# Patient Record
Sex: Female | Born: 1960 | Race: White | Hispanic: No | Marital: Married | Smoking: Former smoker
Health system: Southern US, Community
[De-identification: ages and names within clinical notes are randomized; demographics above are authoritative.]

## PROBLEM LIST (undated history)

## (undated) DIAGNOSIS — G709 Myoneural disorder, unspecified: Secondary | ICD-10-CM

## (undated) DIAGNOSIS — T8859XA Other complications of anesthesia, initial encounter: Secondary | ICD-10-CM

## (undated) DIAGNOSIS — F419 Anxiety disorder, unspecified: Secondary | ICD-10-CM

## (undated) DIAGNOSIS — J302 Other seasonal allergic rhinitis: Secondary | ICD-10-CM

## (undated) DIAGNOSIS — I10 Essential (primary) hypertension: Secondary | ICD-10-CM

## (undated) DIAGNOSIS — L989 Disorder of the skin and subcutaneous tissue, unspecified: Secondary | ICD-10-CM

## (undated) DIAGNOSIS — Z973 Presence of spectacles and contact lenses: Secondary | ICD-10-CM

## (undated) DIAGNOSIS — E785 Hyperlipidemia, unspecified: Secondary | ICD-10-CM

## (undated) DIAGNOSIS — G43909 Migraine, unspecified, not intractable, without status migrainosus: Secondary | ICD-10-CM

## (undated) DIAGNOSIS — M1990 Unspecified osteoarthritis, unspecified site: Secondary | ICD-10-CM

## (undated) DIAGNOSIS — M545 Low back pain, unspecified: Secondary | ICD-10-CM

## (undated) DIAGNOSIS — T4145XA Adverse effect of unspecified anesthetic, initial encounter: Secondary | ICD-10-CM

## (undated) DIAGNOSIS — G8929 Other chronic pain: Secondary | ICD-10-CM

## (undated) DIAGNOSIS — G2581 Restless legs syndrome: Secondary | ICD-10-CM

## (undated) HISTORY — DX: Disorder of the skin and subcutaneous tissue, unspecified: L98.9

## (undated) HISTORY — DX: Hyperlipidemia, unspecified: E78.5

## (undated) HISTORY — PX: JOINT REPLACEMENT: SHX530

## (undated) HISTORY — PX: VAGINAL HYSTERECTOMY: SUR661

## (undated) HISTORY — PX: BACK SURGERY: SHX140

## (undated) HISTORY — PX: COLONOSCOPY: SHX174

---

## 2005-10-06 ENCOUNTER — Ambulatory Visit (HOSPITAL_COMMUNITY): Admission: RE | Admit: 2005-10-06 | Discharge: 2005-10-06 | Payer: Self-pay | Admitting: Neurosurgery

## 2006-12-02 ENCOUNTER — Ambulatory Visit (HOSPITAL_COMMUNITY): Admission: RE | Admit: 2006-12-02 | Discharge: 2006-12-02 | Payer: Self-pay | Admitting: Neurosurgery

## 2007-10-05 ENCOUNTER — Ambulatory Visit: Payer: Self-pay | Admitting: Gastroenterology

## 2007-10-05 DIAGNOSIS — R635 Abnormal weight gain: Secondary | ICD-10-CM

## 2007-10-05 DIAGNOSIS — K5900 Constipation, unspecified: Secondary | ICD-10-CM | POA: Insufficient documentation

## 2007-10-05 LAB — CONVERTED CEMR LAB
ALT: 52 units/L — ABNORMAL HIGH (ref 0–35)
BUN: 8 mg/dL (ref 6–23)
Basophils Absolute: 0 10*3/uL (ref 0.0–0.1)
Bilirubin Urine: NEGATIVE
Calcium: 9.4 mg/dL (ref 8.4–10.5)
Chloride: 103 meq/L (ref 96–112)
Eosinophils Absolute: 0.4 10*3/uL (ref 0.0–0.6)
GFR calc non Af Amer: 82 mL/min
Glucose, Bld: 95 mg/dL (ref 70–99)
HCT: 42.3 % (ref 36.0–46.0)
Hemoglobin, Urine: NEGATIVE
Hemoglobin: 14.2 g/dL (ref 12.0–15.0)
Ketones, ur: NEGATIVE mg/dL
Leukocytes, UA: NEGATIVE
MCHC: 33.6 g/dL (ref 30.0–36.0)
MCV: 89.1 fL (ref 78.0–100.0)
Monocytes Absolute: 0.5 10*3/uL (ref 0.2–0.7)
Neutro Abs: 5.9 10*3/uL (ref 1.4–7.7)
Neutrophils Relative %: 63.8 % (ref 43.0–77.0)
Nitrite: POSITIVE — AB
Potassium: 3.8 meq/L (ref 3.5–5.1)
T4, Total: 7.8 ug/dL (ref 5.0–12.5)
TSH: 1.74 microintl units/mL (ref 0.35–5.50)
Total Protein, Urine: NEGATIVE mg/dL
pH: 5 (ref 5.0–8.0)

## 2007-10-13 ENCOUNTER — Ambulatory Visit: Payer: Self-pay | Admitting: Gastroenterology

## 2007-10-13 LAB — CONVERTED CEMR LAB
Anti Nuclear Antibody(ANA): NEGATIVE
HCV Ab: NEGATIVE
Saturation Ratios: 20.7 % (ref 20.0–50.0)

## 2007-10-14 ENCOUNTER — Ambulatory Visit (HOSPITAL_COMMUNITY): Admission: RE | Admit: 2007-10-14 | Discharge: 2007-10-14 | Payer: Self-pay | Admitting: Gastroenterology

## 2007-10-20 ENCOUNTER — Encounter: Payer: Self-pay | Admitting: Gastroenterology

## 2007-10-20 ENCOUNTER — Ambulatory Visit: Payer: Self-pay | Admitting: Gastroenterology

## 2007-11-23 ENCOUNTER — Ambulatory Visit: Payer: Self-pay | Admitting: Gastroenterology

## 2008-04-03 ENCOUNTER — Ambulatory Visit (HOSPITAL_COMMUNITY): Admission: RE | Admit: 2008-04-03 | Discharge: 2008-04-03 | Payer: Self-pay | Admitting: Neurosurgery

## 2009-12-04 ENCOUNTER — Emergency Department (HOSPITAL_COMMUNITY): Admission: EM | Admit: 2009-12-04 | Discharge: 2009-12-04 | Payer: Self-pay | Admitting: Emergency Medicine

## 2010-04-12 ENCOUNTER — Ambulatory Visit: Payer: Self-pay | Admitting: Vascular Surgery

## 2010-04-12 ENCOUNTER — Emergency Department (HOSPITAL_COMMUNITY): Admission: EM | Admit: 2010-04-12 | Discharge: 2010-04-12 | Payer: Self-pay | Admitting: Emergency Medicine

## 2010-04-12 ENCOUNTER — Encounter (INDEPENDENT_AMBULATORY_CARE_PROVIDER_SITE_OTHER): Payer: Self-pay | Admitting: Emergency Medicine

## 2010-06-26 ENCOUNTER — Emergency Department (HOSPITAL_COMMUNITY): Admission: EM | Admit: 2010-06-26 | Discharge: 2010-06-27 | Payer: Self-pay | Admitting: Emergency Medicine

## 2011-01-13 NOTE — Letter (Signed)
October 05, 2007    Mrs. Kendra Nelson   RE:  AVANTI, JETTER  MRN:  [id number]  /  DOB:  September 14, 1960   Dear Mrs. Kendra Nelson:   It is my pleasure to have treated you recently as a new patient in my  office.  I appreciate your confidence and the opportunity to participate  in your care.   Since I do have a busy inpatient endoscopy schedule and office schedule,  my office hours vary weekly.  I am, however, available for emergency  calls every day through my office.  If I cannot promptly meet an urgent  office appointment, another one of our gastroenterologists will be able  to assist you.   My well-trained staff are prepared to help you at all times.  For  emergencies after office hours, a physician from our gastroenterology  section is always available through my 24-hour answering service.   While you are under my care, I encourage discussion of your questions  and concerns, and I will be happy to return your calls as soon as I am  available.   Once again, I welcome you as a new patient and I look forward to a happy  and healthy relationship.    Sincerely,      Barbette Hair. Arlyce Dice, MD,FACG  Electronically Signed   RDK/MedQ  DD: 10/05/2007  DT: 10/05/2007  Job #: [id number]

## 2011-01-13 NOTE — Assessment & Plan Note (Signed)
[facility]                         GASTROENTEROLOGY OFFICE NOTE   Kendra Nelson, Kendra Nelson                   MRN:          [id number]  DATE:10/05/2007                            DOB:          September 01, 1960    PROBLEM:  Constipation.   Kendra Nelson is a 50 year old white female, here for evaluation of  constipation.  Over the last eight months, she has been suffering from  increasingly severe constipation.  Now she may not have a bowel movement  for up to one month.  Until a year ago, she was moving her bowels  regularly.  She has an implantable Dilaudid pump for chronic back pain.  She has had occasional bleeding when she strains at the stool.  With  constipation, she has abdominal distention and some discomfort.  She  also complains of severe swelling of her feet and legs.  She has gained  40-50 pounds over the past year.  She denies cold intolerance, change in  hair or skin texture or voice changes.   PAST MEDICAL HISTORY:  Pertinent for spinal surgery in 2003.  She has  the implantable pump and what sounds like an implantable TENS unit.  She  is status post hysterectomy.   FAMILY HISTORY:  Not contributory.   MEDICATIONS:  Her only medication is Dilaudid.   ALLERGIES:  She has no allergies.   She smokes a quarter pack a day.  She does not drink.  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   REVIEW OF SYSTEMS:  Positive for depression.   PHYSICAL EXAMINATION:  Pulse 72, blood pressure 126/80, weight 184.  HEENT: EOMI.  PERRLA.  Sclerae are anicteric.  Conjunctivae are pink.  NECK:  Supple without thyromegaly, adenopathy or carotid bruits.  CHEST:  Clear to auscultation and percussion without adventitious  sounds.  CARDIAC:  Regular rhythm; normal S1 S2.  There are no murmurs, gallops  or rubs.  ABDOMEN:  She has palpable devices in her lower abdomen bilaterally,  corresponding to the pumps and TENS unit.  Bowel sounds are  normoactive.  Abdomen is soft, nontender and nondistended.  There are no abdominal  masses, tenderness, splenic enlargement or hepatomegaly.  EXTREMITIES:  She has 1 to 2+ pitting edema in her lower extremities  below the knees.  Full range of motion.  No cyanosis, clubbing or edema.  RECTAL:  Deferred.   IMPRESSION:  Chronic constipation.  This certainly could be due to her  Dilaudid pump.  Her overall body swelling and weight-gain could also be  explained by hypothyroidism.   RECOMMENDATION:  Check CBC, CMET, urinalysis and thyroid function tests.  If not diagnostic, then I will proceed with colonoscopy and place her on  a bowel regimen, provided that no lesions are seen.     Barbette Hair. Arlyce Dice, MD,FACG  Electronically Signed    RDK/MedQ  DD: 10/05/2007  DT: 10/05/2007  Job #: [id number]   cc:   Tracey Harries, M.D.

## 2011-01-13 NOTE — Assessment & Plan Note (Signed)
[facility]                         GASTROENTEROLOGY OFFICE NOTE   Kendra Nelson, Kendra Nelson                   MRN:          [id number]  DATE:11/23/2007                            DOB:          [birth year]    REFERRING PHYSICIAN:  Tracey Harries, M.D.   PROBLEM:  Constipation.   Kendra Nelson returned for scheduled GI followup.  Colonoscopy  demonstrated a 4 mm sigmoid polyp.  Biopsies showed hyperplastic changes  only.  On a regimen of Amitiza 24 mcg twice a day and lactulose 30 mL  twice a day, she continues to suffer from obstipation and constipation.  She will have a bowel movement every two weeks.  Bowel movements are  watery.  She complains of fatigue and swelling of her legs.   EXAM:  Pulse 80, blood pressure 108/64, weight 182.   IMPRESSION:  Chronic constipation and obstipation.  I believe this is  related to her chronic narcotic use.  Patient clearly states that she  had much less constipation when taking oral narcotics, compared with the  results from her Dilaudid pump.  The current bowel regimen has been  unsuccessful.   RECOMMENDATIONS:  I have recommended to Kendra Nelson that she try  taking Fleet Phospho-Soda 1.5 to 3 ounces every four to five days to see  whether this can successfully keep her bowels moving.  Alternatively,  she may need to switch from the Dilaudid pump to oral narcotics, since  the oral medicines did not have the severe constipating effect.  She is  going to try the [employer].  Failing that, she will probably  have to discontinue her Dilaudid pump.   I instructed Kendra Nelson to return in three to four weeks, should she  decide to continue with the Fleet Phospho-Soda regimen.     Barbette Hair. Arlyce Dice, MD,FACG  Electronically Signed    RDK/MedQ  DD: 11/23/2007  DT: 11/23/2007  Job #: [id number]

## 2011-01-13 NOTE — Op Note (Signed)
Kendra Nelson, Kendra Nelson            ACCOUNT NO.:  [id number]   MEDICAL RECORD NO.:  [id number]          PATIENT TYPE:  OIB   LOCATION:  3533                         FACILITY:  [facility]   PHYSICIAN:  Danae Orleans. Venetia Maxon, M.D.  DATE OF BIRTH:  [birth year]   DATE OF PROCEDURE:  04/03/2008  DATE OF DISCHARGE:  04/03/2008                               OPERATIVE REPORT   PREOPERATIVE DIAGNOSIS:  Depleted implantable pulse generators (Synergy  spinal cord stimulator battery).   POSTOPERATIVE DIAGNOSIS:  Depleted implantable pulse generators (Synergy  spinal cord stimulator battery)   PROCEDURE:  Replacement of right lower quadrant implantable pulse  generator Synergy battery.   SURGEON:  Danae Orleans. Venetia Maxon, MD   ANESTHESIA:  MAC with local anesthetic.   ESTIMATED BLOOD LOSS:  Minimal.   COMPLICATIONS:  None.   DISPOSITION:  Recovery.   INDICATIONS:  Aberdeen Hafen is a 50 year old woman with chronic  intractable back and bilateral lower extremity pain.  She has an  implantable pulse generator and spinal cord stimulator, which was placed  in another facility, I have revised the battery 1 time and it was  depleted again.  She comes back for better revision.   PROCEDURE:  Ms. Bracher was brought to the operating room.  She was  placed in the supine position on the operating table.  Her right lower  quadrant was prepped and draped using Betadine scrub and paint.  Her  area of planned incision was infiltrated with local lidocaine.  Previous  incision was reopened.  Battery was removed from the subcutaneous  pocket.  The lead connectors were disconnected and placed in the same  orientation on new battery.  Connections were tightened down with torque  screwdriver.  Battery was placed in the subcutaneous pocket.  The wound  was irrigated and closed with 2-0 and 3-0 Vicryl subcuticular stitches.  The wound was dressed with benzoin, Steri-Strips, and telpha, gauze and  tape.  The patient was  extubated in the operating room and taken to the  recovery room in stable satisfactory condition.  She tolerated the  operation well.  Counts were correct at the end of the case.  Laryngeal  mask anesthesia was applied for the surgery.  At the end of the case,  the LMA was removed.      Danae Orleans. Venetia Maxon, M.D.  Electronically Signed     JDS/MEDQ  D:  04/03/2008  T:  04/04/2008  Job:  [id number]

## 2011-01-16 NOTE — Op Note (Signed)
[name], [name]            ACCOUNT NO.:  [id number]   MEDICAL RECORD NO.:  [id number]          PATIENT TYPE:  AMB   LOCATION:  SDS                          FACILITY:  [facility]   PHYSICIAN:  Danae Orleans. Venetia Maxon, M.D.  DATE OF BIRTH:  [birth year]   DATE OF PROCEDURE:  12/02/2006  DATE OF DISCHARGE:                               OPERATIVE REPORT   PREOPERATIVE DIAGNOSIS:  Depleted implantable pulse generator for spinal  cord stimulator ([employer]tr[employer] Synergy battery).   POSTOPERATIVE DIAGNOSIS:  Depleted implantable pulse generator for  spinal cord stimulator Museum/gallery curator).   PROCEDURE:  Replacement of Synergy implantable pulse generator.   SURGEON:  Venetia Maxon.   ASSISTANT:  [place], RN.   ANESTHESIA:  General endotracheal anesthesia.   ESTIMATED BLOOD LOSS:  Minimal.   COMPLICATIONS:  None.   DISPOSITION:  To recovery.   INDICATION:  Kendra Nelson is a 50 year old woman with an implantable  spinal cord stimulator which was placed at another institution.  She now  has a depleted battery and wishes to have this revised.  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   PROCEDURE IN DETAIL:  Ms. Saar was brought to the operating room  after satisfactory induction of LMA anesthesia.  She was placed in a  supine position on the operating room table.  Her battery had been  placed in the right lower quadrant of her abdomen.  After prepping and  draping the skin in a standard sterile fashion, the area of planned  incision was infiltrated with local lidocaine and the previous incision  was reopened.  The battery was removed and extensions were tacked and  disconnected and reconnected to a new battery in the same configurations  previously and contacts were secured with torque screwdriver.  The  battery was then inserted into the subcutaneous pocket.  The incision  was closed with 2-0 and 3-0 Vicryl sutures and a sterile occlusive  dressing was placed.  The  patient tolerated the procedure and was taken  to recovery in stable and satisfactory condition.     Danae Orleans. Venetia Maxon, M.D.  Electronically Signed    JDS/MEDQ  D:  12/02/2006  T:  12/02/2006  Job:  [id number]

## 2011-01-16 NOTE — Op Note (Signed)
NAMEZALMA, CHANNING            ACCOUNT NO.:  [id number]   MEDICAL RECORD NO.:  [id number]          PATIENT TYPE:  INP   LOCATION:  NA                           FACILITY:  [facility]   PHYSICIAN:  Danae Orleans. Venetia Maxon, M.D.  DATE OF BIRTH:  09-12-1960   DATE OF PROCEDURE:  10/06/2005  DATE OF DISCHARGE:                                 OPERATIVE REPORT   PREOPERATIVE DIAGNOSIS:  Chronic intractable back and lower extremity pain.   POSTOPERATIVE DIAGNOSIS:  Chronic intractable back and lower extremity pain.   PROCEDURE:  Intrathecal Dilaudid catheter and subcutaneous pump placement.   SURGEON:  Danae Orleans. Venetia Maxon, M.D.   ANESTHESIA:  General endotracheal anesthesia.   ESTIMATED BLOOD LOSS:  Minimal.   COMPLICATIONS:  None.   DISPOSITION:  To recovery.   INDICATIONS:  Jonathan Kirkendoll is a 50 year old woman who has had multiple  prior spinal surgical procedures, including multilevel fusion.  She has  chronic intractable pain.  She has had spinal cord stimulators placed and  had a trial of intrathecal Dilaudid with relief of her pain.  It was elected  to place an intrathecal Dilaudid pump.   PROCEDURE:  Ms. Valentina Gu was brought to the operating room.  Following the  successful and uncomplicated induction of general endotracheal anesthesia,  the patient was placed in a right lateral decubitus position with her left  side of the body up on a bean bag and with an axillary roll.  Soft tissue  and bony prominences were padded appropriately.  Her back and abdomen were  then prepped and draped in the usual sterile fashion.  Using intraoperative  fluoroscopy with an entry point at the left side of the L3 pedicle, a Tuohy  needle was placed into the thecal sac and it was placed carefully to avoid  the previous hardware and also previously-placed electrodes.  There was  brisk return of CSF, and the intrathecal catheter was threaded without  difficulty.  Then an incision was made overlying the  Tuohy needle and this  was then carried to the lumbar dorsal fascia.  The needle and stylet were  withdrawn.  An anchor was placed with a 2-0 silk stitch, and there was free  flow of spinal fluid from the catheter.  Subsequently the abdominal pocket  was created and three 2-0 silk stitches were placed in the fascia to secure  the pump after the pump was placed.  A tunnel was then created from the back  to the abdominal pocket and the two-piece catheter assembly was then placed.  The redundant catheter was then cut and the pin was placed and secured with  the standard two-piece assembly method with 2-0 silk ties overlying the  Silastic cover.  This was then anchored to the fascia, leaving a gentle  curve of catheter to the spine.  Subsequently there was a free flow of  spinal fluid from the distal catheter.  This was then hooked to the pump and  the pump was then secured to the fascia, placed in the abdominal pocket.  Redundant catheter was carefully placed behind the pump and a silk tie  was  placed overlying the junction of the pump and the catheter.  All wounds were  then copiously irrigated with bacitracin and saline and  closed with 2-0 and 3-0 Vicryl interrupted, inverted sutures and the wounds  were dressed with Benzoin and Steri-Strips, Telfa gauze and tape.  The  patient was extubated in the operating room and taken to the recovery room  in stable and satisfactory condition, having tolerated her operation well.  Counts were correct x2.      Danae Orleans. Venetia Maxon, M.D.  Electronically Signed     JDS/MEDQ  D:  10/06/2005  T:  10/06/2005  Job:  [id number]

## 2011-05-29 LAB — DIFFERENTIAL
Lymphocytes Relative: 31
Lymphs Abs: 2.8
Monocytes Absolute: 0.5
Monocytes Relative: 6
Neutro Abs: 5.1
Neutrophils Relative %: 57

## 2011-05-29 LAB — CBC
Hemoglobin: 14.1
MCHC: 33.7
RBC: 4.49
WBC: 9

## 2011-11-06 DIAGNOSIS — I10 Essential (primary) hypertension: Secondary | ICD-10-CM | POA: Insufficient documentation

## 2012-04-25 ENCOUNTER — Other Ambulatory Visit: Payer: Self-pay | Admitting: Neurosurgery

## 2012-05-03 ENCOUNTER — Encounter (HOSPITAL_COMMUNITY): Payer: Self-pay | Admitting: Pharmacy Technician

## 2012-05-05 ENCOUNTER — Encounter (HOSPITAL_COMMUNITY): Payer: Self-pay

## 2012-05-05 ENCOUNTER — Encounter (HOSPITAL_COMMUNITY)
Admission: RE | Admit: 2012-05-05 | Discharge: 2012-05-05 | Disposition: A | Discharge status: Home / Self Care | Payer: Medicare Other | Source: Ambulatory Visit | Attending: Neurosurgery | Admitting: Neurosurgery

## 2012-05-05 HISTORY — DX: Adverse effect of unspecified anesthetic, initial encounter: T41.45XA

## 2012-05-05 HISTORY — DX: Other complications of anesthesia, initial encounter: T88.59XA

## 2012-05-05 HISTORY — DX: Myoneural disorder, unspecified: G70.9

## 2012-05-05 HISTORY — DX: Unspecified osteoarthritis, unspecified site: M19.90

## 2012-05-05 LAB — CBC
HCT: 42.2 % (ref 36.0–46.0)
Hemoglobin: 14.5 g/dL (ref 12.0–15.0)
MCH: 30.7 pg (ref 26.0–34.0)
MCHC: 34.4 g/dL (ref 30.0–36.0)
MCV: 89.4 fL (ref 78.0–100.0)
RBC: 4.72 MIL/uL (ref 3.87–5.11)

## 2012-05-05 LAB — SURGICAL PCR SCREEN: Staphylococcus aureus: NEGATIVE

## 2012-05-05 NOTE — Pre-Procedure Instructions (Signed)
20 Kendra Nelson  05/05/2012   Your procedure is scheduled on: Tuesday  05/10/12   Report to [facility] at 1015 AM.  Call this number if you have problems the morning of surgery: [phone]   Remember:   Do not eat food OR DRINK :After Midnight.    Take these medicines the morning of surgery with A SIP OF WATER:  VALIUM IF NEEDED, OXYCODONE IF NEEDED (STOP ASPIRIN, COUMADIN, PLAVIX, EFFIENT, HERBAL MEDICINES)   Do not wear jewelry, make-up or nail polish.  Do not wear lotions, powders, or perfumes. You may wear deodorant.  Do not shave 48 hours prior to surgery. Men may shave face and neck.  Do not bring valuables to the hospital.  Contacts, dentures or bridgework may not be worn into surgery.  Leave suitcase in the car. After surgery it may be brought to your room.  For patients admitted to the hospital, checkout time is 11:00 AM the day of discharge.   Patients discharged the day of surgery will not be allowed to drive home.  Name and phone number of your driver:   Special Instructions: CHG Shower Use Special Wash: 1/2 bottle night before surgery and 1/2 bottle morning of surgery.   Please read over the following fact sheets that you were given: Pain Booklet, Coughing and Deep Breathing, MRSA Information and Surgical Site Infection Prevention

## 2012-05-06 NOTE — Consult Note (Signed)
Anesthesia chart review: Patient is a 51 year old female scheduled for pump replacement. (She has a Medtronic spinal cord stimulator.)  This is scheduled for 05/10/12 by Dr. Venetia Maxon.  Other history includes smoking, headaches.  She reported that she woke up during prior back surgery.  (Her only procedure at [facility] was replacement of right lower quadrant implantable pulse generator Synergy battery on 04/03/08 under local and MAC.)  Her assigned Anesthesiologist will evaluate her on the day of surgery.  Shonna Chock, PA-C

## 2012-05-09 MED ORDER — CEFAZOLIN SODIUM-DEXTROSE 2-3 GM-% IV SOLR
2.0000 g | INTRAVENOUS | Status: AC
  Administered 2012-05-10: 2 g via INTRAVENOUS
  Filled 2012-05-09: qty 50

## 2012-05-09 NOTE — H&P (Signed)
NEUROSURGICAL CONSULTATION   Kendra Nelson   DOB: 23-Nov-1960 #[id number]    April 25, 2012   HISTORY:     Kendra Nelson is a 51 year old retired woman in whom I placed an intrathecal pump for pain management, which was placed on 10/06/2005. Additionally, she has had a spinal cord stimulator, which was placed at another institution and the pulse generator which I revised on 12/02/2006.  Dr. Maple Hudson has been managing her pain issues and she comes in today stating that she had a pump alarm on 7/29 and that the pump is not working well and that there appears to be a low battery alarm, and there is evidence of a rotor stall on the pump. She is interested in doing away with the pump; however, she continues to use it and there was some discussion about going off the pump.  I do not believe that stopping it altogether and going to oral medications at this point makes a great deal of sense.  She is currently receiving  in the pump Hydromorphone 7 mg. per cc., Clonidine 65 mcg./ml. and Fentanyl 250 mcg./ml., with a total daily dose of drug of Hydromorphone .0426 mg. per day, Clonidine .396 mcg. per day, and Fentanyl 1.31 mcg. per day.  The patient says that her implanted spinal cord stimulator has been working well, that she is currently taking Diazepam 10 mg. three per day, Hydromorphone 2 mg. two per day, and Oxycodone 10 mg. three per day when her pumped stopped working. She is also on Lisinopril 10 mg. daily for high blood pressure and a Fentanyl patch 100 mcg. per hour every three days after the pump was depleted and no longer functioning.  She expressed a desire for the pump to be left alone, for her to come off of the pump intrathecal medications, and then if she needs the pump despite an effort to come off the intrathecal medications that this could be revised at that point.  I expressed concern, as did Kendra Nelson from [employer], that leaving a nonfunctioning pump for a long period of time would have a significant risk of  blocking the catheter and at that point it would no longer be functional.    In light of this, the patient would like to proceed with pump revision and this would be performed under monitored anesthesia. She wishes to do so and we plan on doing so on 05/10/2012.    Since I have last seen her, Kendra Nelson has been stable.  She has high blood pressure.  She says that she is having a lot of trouble with constipation.    REVIEW OF SYSTEMS:   A detailed Review of Systems sheet was reviewed with the patient.  Pertinent positives include under cardiovascular - high blood pressure, swelling in feet, leg pain while walking and sitting, under gastrointestinal - change in bowel habits, under musculoskeletal - back pain, leg pain, and arthritis.  All other systems are negative; this includes   Constitutional symptoms, Eyes, Ears, nose, mouth, throat, Endocrine, Respiratory, Genitourinary, Integumentary & Breast, Neurologic, Psychiatric, Hematologic/Lymphatic, Allergic/Immunologic.    PAST MEDICAL HISTORY:      Current Medical Conditions:    As previously described above.     Prior Operations and Hospitalizations:   As previously described above.      Medications and Allergies:  As previously described above.      Height and Weight:     She is 5'3" tall and 146 lbs.    FAMILY HISTORY:  Her mother is deceased. Her father's health history is unknown.    SOCIAL HISTORY:    She continues to smoke half pack per day of cigarettes.  She denies alcohol or drug use.    PHYSICAL EXAMINATION:      General Appearance:   On examination today, Kendra Nelson is a pleasant and cooperative woman in no acute distress.      Blood Pressure, Pulse:     Her blood pressure is 140/100.  Heart rate is 74 and regular.  Respiratory rate is 18.      HEENT - normocephalic, atraumatic.  The pupils are equal, round and reactive to light.  The extraocular muscles are intact.  Sclerae - white.  Conjunctiva - pink.  Oropharynx  benign.  Uvula midline.     Neck - there are no masses, meningismus, deformities, tracheal deviation, jugular vein distention or carotid bruits.  There is normal cervical range of motion.  Spurlings' test is negative without reproducible radicular pain turning the patient's head to either side.  Lhermitte's sign is not present with axial compression.      Respiratory - there is normal respiratory effort with good intercostal function.  Lungs are clear to auscultation.  There are no rales, rhonchi or wheezes.      Cardiovascular - the heart has regular rate and rhythm to auscultation.  No murmurs are appreciated.  There is no extremity edema, cyanosis or clubbing.  There are palpable pedal pulses.      Abdomen - soft, nontender, no hepatosplenomegaly appreciated or masses.  There are active bowel sounds.  No guarding or rebound.      Musculoskeletal Examination - the patient is able to walk about the examining room with a normal, casual, heel and toe gait.  A complaint of bilateral lower extremity pain.    NEUROLOGICAL EXAMINATION: The patient is oriented to time, person and place and has good recall of both recent and remote memory with normal attention span and concentration.  The patient speaks with clear and fluent speech and exhibits normal language function and appropriate fund of knowledge.      Cranial Nerve Examination - pupils are equal, round and reactive to light.  Extraocular movements are full.  Visual fields are full to confrontational testing.  Facial sensation and facial movement are symmetric and intact.  Hearing is intact to finger rub.  Palate is upgoing.  Shoulder shrug is symmetric.  Tongue protrudes in the midline.      Motor Examination - motor strength is 5/5 in the bilateral deltoids, biceps, triceps, handgrips, wrist extensors, interosseous.  In the lower extremities motor strength is 5/5 in hip flexion, extension, quadriceps, hamstrings, plantar flexion, dorsiflexion and  extensor hallucis longus.      Sensory Examination - normal to light touch and pinprick sensation in the upper and lower extremities.     Deep Tendon Reflexes - 2 in the biceps, triceps, and brachioradialis, 2 in the knees, 2 in the ankles.  The great toes are downgoing to plantar stimulation.      Cerebellar Examination - normal coordination in upper and lower extremities and normal rapid alternating movements.  Romberg test is negative.    IMPRESSION AND RECOMMENDATIONS: Kendra Nelson is a 51 year old woman with implanted pulse generator for spinal cord stimulator, and intrathecal pump for chronic intractable back and lower extremity pain.  After a lengthy discussion with the patient about her options, she does wish to proceed with revision of intrathecal pump, and this will  be scheduled for 05/10/2012.  Risks and benefits were discussed and she wishes to proceed.     [facility]    Danae Orleans. Venetia Maxon, M.D.  JDS:aft cc: Dr. Charmayne Sheer

## 2012-05-10 ENCOUNTER — Observation Stay (HOSPITAL_COMMUNITY)
Admission: RE | Admit: 2012-05-10 | Discharge: 2012-05-10 | Disposition: A | Discharge status: Home / Self Care | Payer: Medicare Other | Source: Ambulatory Visit | Attending: Neurosurgery | Admitting: Neurosurgery

## 2012-05-10 ENCOUNTER — Ambulatory Visit (HOSPITAL_COMMUNITY): Payer: Medicare Other | Admitting: Vascular Surgery

## 2012-05-10 ENCOUNTER — Encounter (HOSPITAL_COMMUNITY): Payer: Self-pay | Admitting: Vascular Surgery

## 2012-05-10 ENCOUNTER — Encounter (HOSPITAL_COMMUNITY): Payer: Self-pay | Admitting: *Deleted

## 2012-05-10 ENCOUNTER — Encounter (HOSPITAL_COMMUNITY)
Admission: RE | Disposition: A | Discharge status: Home / Self Care | Payer: Self-pay | Source: Ambulatory Visit | Attending: Neurosurgery

## 2012-05-10 DIAGNOSIS — M545 Low back pain, unspecified: Secondary | ICD-10-CM | POA: Insufficient documentation

## 2012-05-10 DIAGNOSIS — G8929 Other chronic pain: Secondary | ICD-10-CM | POA: Insufficient documentation

## 2012-05-10 DIAGNOSIS — Z01812 Encounter for preprocedural laboratory examination: Secondary | ICD-10-CM | POA: Insufficient documentation

## 2012-05-10 DIAGNOSIS — Z462 Encounter for fitting and adjustment of other devices related to nervous system and special senses: Principal | ICD-10-CM | POA: Insufficient documentation

## 2012-05-10 HISTORY — PX: PAIN PUMP IMPLANTATION: SHX330

## 2012-05-10 SURGERY — PAIN PUMP INSERTION
Anesthesia: Monitor Anesthesia Care | Site: Abdomen | Wound class: Clean

## 2012-05-10 MED ORDER — DIAZEPAM 5 MG PO TABS: 10.0000 mg | ORAL_TABLET | Freq: Three times a day (TID) | ORAL | Status: DC

## 2012-05-10 MED ORDER — 0.9 % SODIUM CHLORIDE (POUR BTL) OPTIME
TOPICAL | Status: DC | PRN
  Administered 2012-05-10: 1000 mL

## 2012-05-10 MED ORDER — SODIUM CHLORIDE 0.9 % IJ SOLN: 3.0000 mL | Freq: Two times a day (BID) | INTRAMUSCULAR | Status: DC

## 2012-05-10 MED ORDER — DIAZEPAM 5 MG PO TABS: 5.0000 mg | ORAL_TABLET | Freq: Four times a day (QID) | ORAL | Status: DC | PRN

## 2012-05-10 MED ORDER — LACTATED RINGERS IV SOLN
INTRAVENOUS | Status: DC | PRN
  Administered 2012-05-10: 15:00:00 via INTRAVENOUS

## 2012-05-10 MED ORDER — ONDANSETRON HCL 4 MG/2ML IJ SOLN: 4.0000 mg | Freq: Four times a day (QID) | INTRAMUSCULAR | Status: DC | PRN

## 2012-05-10 MED ORDER — OXYCODONE HCL 5 MG PO TABS: 15.0000 mg | ORAL_TABLET | Freq: Three times a day (TID) | ORAL | Status: DC

## 2012-05-10 MED ORDER — ONDANSETRON HCL 4 MG/2ML IJ SOLN: 4.0000 mg | INTRAMUSCULAR | Status: DC | PRN

## 2012-05-10 MED ORDER — ONDANSETRON HCL 4 MG/2ML IJ SOLN
INTRAMUSCULAR | Status: DC | PRN
  Administered 2012-05-10: 4 mg via INTRAVENOUS

## 2012-05-10 MED ORDER — SODIUM CHLORIDE 0.9 % IV SOLN
INTRAVENOUS | Status: AC
  Filled 2012-05-10: qty 500

## 2012-05-10 MED ORDER — PROPOFOL INFUSION 10 MG/ML OPTIME
INTRAVENOUS | Status: DC | PRN
  Administered 2012-05-10: 50 ug/kg/min via INTRAVENOUS

## 2012-05-10 MED ORDER — BUPIVACAINE HCL (PF) 0.5 % IJ SOLN
INTRAMUSCULAR | Status: DC | PRN
  Administered 2012-05-10: 5 mL

## 2012-05-10 MED ORDER — SODIUM CHLORIDE 0.9 % IR SOLN
Status: DC | PRN
  Administered 2012-05-10: 16:00:00

## 2012-05-10 MED ORDER — VITAMIN C 500 MG PO TABS: 1000.0000 mg | ORAL_TABLET | Freq: Two times a day (BID) | ORAL | Status: DC

## 2012-05-10 MED ORDER — LIDOCAINE-EPINEPHRINE 1 %-1:100000 IJ SOLN
INTRAMUSCULAR | Status: DC | PRN
  Administered 2012-05-10: 5 mL

## 2012-05-10 MED ORDER — BACITRACIN 50000 UNITS IM SOLR
INTRAMUSCULAR | Status: AC
  Filled 2012-05-10: qty 1

## 2012-05-10 MED ORDER — MIDAZOLAM HCL 5 MG/5ML IJ SOLN
INTRAMUSCULAR | Status: DC | PRN
  Administered 2012-05-10 (×2): 2 mg via INTRAVENOUS

## 2012-05-10 MED ORDER — HYDROMORPHONE HCL PF 1 MG/ML IJ SOLN: 0.2500 mg | INTRAMUSCULAR | Status: DC | PRN

## 2012-05-10 MED ORDER — ACETAMINOPHEN 325 MG PO TABS: 650.0000 mg | ORAL_TABLET | ORAL | Status: DC | PRN

## 2012-05-10 MED ORDER — SODIUM CHLORIDE 0.9 % IV SOLN: 250.0000 mL | INTRAVENOUS | Status: DC

## 2012-05-10 MED ORDER — HYDROCODONE-ACETAMINOPHEN 5-325 MG PO TABS: 1.0000 | ORAL_TABLET | ORAL | Status: DC | PRN

## 2012-05-10 MED ORDER — DOCUSATE SODIUM 100 MG PO CAPS: 100.0000 mg | ORAL_CAPSULE | Freq: Two times a day (BID) | ORAL | Status: DC

## 2012-05-10 MED ORDER — SODIUM CHLORIDE 0.9 % IJ SOLN: 3.0000 mL | INTRAMUSCULAR | Status: DC | PRN

## 2012-05-10 MED ORDER — FENTANYL 100 MCG/HR TD PT72: 100.0000 ug | MEDICATED_PATCH | TRANSDERMAL | Status: DC

## 2012-05-10 MED ORDER — MORPHINE SULFATE 2 MG/ML IJ SOLN: 1.0000 mg | INTRAMUSCULAR | Status: DC | PRN

## 2012-05-10 MED ORDER — CEFAZOLIN SODIUM 1-5 GM-% IV SOLN
1.0000 g | Freq: Three times a day (TID) | INTRAVENOUS | Status: DC
  Filled 2012-05-10 (×2): qty 50

## 2012-05-10 MED ORDER — PHENOL 1.4 % MT LIQD: 1.0000 | OROMUCOSAL | Status: DC | PRN

## 2012-05-10 MED ORDER — FENTANYL CITRATE 0.05 MG/ML IJ SOLN
INTRAMUSCULAR | Status: DC | PRN
  Administered 2012-05-10 (×10): 25 ug via INTRAVENOUS

## 2012-05-10 MED ORDER — KCL IN DEXTROSE-NACL 20-5-0.45 MEQ/L-%-% IV SOLN
INTRAVENOUS | Status: DC
  Filled 2012-05-10 (×2): qty 1000

## 2012-05-10 MED ORDER — ACETAMINOPHEN 650 MG RE SUPP: 650.0000 mg | RECTAL | Status: DC | PRN

## 2012-05-10 MED ORDER — EPHEDRINE SULFATE 50 MG/ML IJ SOLN
INTRAMUSCULAR | Status: DC | PRN
  Administered 2012-05-10: 5 mg via INTRAVENOUS

## 2012-05-10 MED ORDER — VITAMIN C 500 MG PO TABS
1000.0000 mg | ORAL_TABLET | Freq: Two times a day (BID) | ORAL | Status: DC
  Filled 2012-05-10: qty 2

## 2012-05-10 MED ORDER — ADULT MULTIVITAMIN W/MINERALS CH
1.0000 | ORAL_TABLET | Freq: Every day | ORAL | Status: DC
  Filled 2012-05-10: qty 1

## 2012-05-10 MED ORDER — OXYCODONE-ACETAMINOPHEN 5-325 MG PO TABS
1.0000 | ORAL_TABLET | ORAL | Status: DC | PRN
  Filled 2012-05-10: qty 2

## 2012-05-10 MED ORDER — MENTHOL 3 MG MT LOZG: 1.0000 | LOZENGE | OROMUCOSAL | Status: DC | PRN

## 2012-05-10 SURGICAL SUPPLY — 78 items
APL SKNCLS STERI-STRIP NONHPOA (GAUZE/BANDAGES/DRESSINGS) ×1
BAG DECANTER FOR FLEXI CONT (MISCELLANEOUS) ×2 IMPLANT
BENZOIN TINCTURE PRP APPL 2/3 (GAUZE/BANDAGES/DRESSINGS) ×1 IMPLANT
BLADE SURG 10 STRL SS (BLADE) ×4 IMPLANT
BLADE SURG 15 STRL LF DISP TIS (BLADE) ×1 IMPLANT
BLADE SURG 15 STRL SS (BLADE) ×2
BLADE SURG ROTATE 9660 (MISCELLANEOUS) ×1 IMPLANT
BOOT SUTURE AID YELLOW STND (SUTURE) ×2 IMPLANT
BRUSH SCRUB EZ 1% IODOPHOR (MISCELLANEOUS) ×1 IMPLANT
CANISTER SUCTION 2500CC (MISCELLANEOUS) ×2 IMPLANT
CLOTH BEACON ORANGE TIMEOUT ST (SAFETY) ×2 IMPLANT
CONT SPEC 4OZ CLIKSEAL STRL BL (MISCELLANEOUS) IMPLANT
CORDS BIPOLAR (ELECTRODE) ×2 IMPLANT
COVER MAYO STAND STRL (DRAPES) ×2 IMPLANT
DRAPE C-ARM 42X72 X-RAY (DRAPES) ×2 IMPLANT
DRAPE INCISE IOBAN 85X60 (DRAPES) ×1 IMPLANT
DRAPE LAPAROTOMY 100X72X124 (DRAPES) ×2 IMPLANT
DRAPE POUCH INSTRU U-SHP 10X18 (DRAPES) ×2 IMPLANT
DRESSING TELFA 8X3 (GAUZE/BANDAGES/DRESSINGS) ×2 IMPLANT
DRSG PAD ABDOMINAL 8X10 ST (GAUZE/BANDAGES/DRESSINGS) IMPLANT
ELECT CAUTERY BLADE 6.4 (BLADE) ×1 IMPLANT
ELECT REM PT RETURN 9FT ADLT (ELECTROSURGICAL) ×2
ELECTRODE REM PT RTRN 9FT ADLT (ELECTROSURGICAL) ×1 IMPLANT
GAUZE SPONGE 4X4 16PLY XRAY LF (GAUZE/BANDAGES/DRESSINGS) ×2 IMPLANT
GLOVE BIO SURGEON STRL SZ 6.5 (GLOVE) ×1 IMPLANT
GLOVE BIO SURGEON STRL SZ8 (GLOVE) ×2 IMPLANT
GLOVE BIOGEL PI IND STRL 7.0 (GLOVE) IMPLANT
GLOVE BIOGEL PI IND STRL 8 (GLOVE) ×1 IMPLANT
GLOVE BIOGEL PI IND STRL 8.5 (GLOVE) ×1 IMPLANT
GLOVE BIOGEL PI INDICATOR 7.0 (GLOVE) ×1
GLOVE BIOGEL PI INDICATOR 8 (GLOVE) ×1
GLOVE BIOGEL PI INDICATOR 8.5 (GLOVE) ×1
GLOVE ECLIPSE 7.5 STRL STRAW (GLOVE) ×1 IMPLANT
GLOVE ECLIPSE 8.0 STRL XLNG CF (GLOVE) ×1 IMPLANT
GLOVE EXAM NITRILE LRG STRL (GLOVE) IMPLANT
GLOVE EXAM NITRILE MD LF STRL (GLOVE) IMPLANT
GLOVE EXAM NITRILE XL STR (GLOVE) IMPLANT
GLOVE EXAM NITRILE XS STR PU (GLOVE) IMPLANT
GLOVE INDICATOR 6.5 STRL GRN (GLOVE) ×1 IMPLANT
GOWN BRE IMP SLV AUR LG STRL (GOWN DISPOSABLE) ×2 IMPLANT
GOWN BRE IMP SLV AUR XL STRL (GOWN DISPOSABLE) IMPLANT
GOWN STRL REIN 2XL LVL4 (GOWN DISPOSABLE) IMPLANT
KIT BASIN OR (CUSTOM PROCEDURE TRAY) ×2 IMPLANT
KIT REFILL (MISCELLANEOUS) ×1
KIT REFILL CATH SYNCHROMED II (MISCELLANEOUS) IMPLANT
KIT ROOM TURNOVER OR (KITS) ×2 IMPLANT
NDL HYPO 18GX1.5 BLUNT FILL (NEEDLE) IMPLANT
NDL HYPO 25X1 1.5 SAFETY (NEEDLE) ×1 IMPLANT
NEEDLE HYPO 18GX1.5 BLUNT FILL (NEEDLE) IMPLANT
NEEDLE HYPO 25X1 1.5 SAFETY (NEEDLE) ×2 IMPLANT
NS IRRIG 1000ML POUR BTL (IV SOLUTION) ×2 IMPLANT
PACK EENT II TURBAN DRAPE (CUSTOM PROCEDURE TRAY) ×1 IMPLANT
PACK SURGICAL SETUP 50X90 (CUSTOM PROCEDURE TRAY) ×1 IMPLANT
PATTIES SURGICAL .5 X.5 (GAUZE/BANDAGES/DRESSINGS) IMPLANT
PATTIES SURGICAL 1X1 (DISPOSABLE) IMPLANT
PENCIL BUTTON HOLSTER BLD 10FT (ELECTRODE) ×2 IMPLANT
SPONGE GAUZE 4X4 12PLY (GAUZE/BANDAGES/DRESSINGS) ×1 IMPLANT
SPONGE LAP 4X18 X RAY DECT (DISPOSABLE) ×2 IMPLANT
SPONGE SURGIFOAM ABS GEL SZ50 (HEMOSTASIS) IMPLANT
STAPLER SKIN PROX WIDE 3.9 (STAPLE) ×2 IMPLANT
STRIP CLOSURE SKIN 1/2X4 (GAUZE/BANDAGES/DRESSINGS) ×2 IMPLANT
SUT BONE WAX W31G (SUTURE) IMPLANT
SUT SILK 0 TIES 10X30 (SUTURE) ×2 IMPLANT
SUT SILK 2 0 FS (SUTURE) ×4 IMPLANT
SUT SILK 2 0 TIES 10X30 (SUTURE) IMPLANT
SUT VIC AB 0 CT1 18XCR BRD8 (SUTURE) ×2 IMPLANT
SUT VIC AB 0 CT1 8-18 (SUTURE) ×2
SUT VIC AB 2-0 CP2 18 (SUTURE) ×4 IMPLANT
SUT VIC AB 3-0 SH 8-18 (SUTURE) ×5 IMPLANT
SYR 20ML ECCENTRIC (SYRINGE) ×2 IMPLANT
SYR 3ML LL SCALE MARK (SYRINGE) ×1 IMPLANT
SYR CONTROL 10ML LL (SYRINGE) ×3 IMPLANT
Synchromed ll Programmable pump ×1 IMPLANT
TAPE CLOTH SURG 4X10 WHT LF (GAUZE/BANDAGES/DRESSINGS) ×1 IMPLANT
TOWEL OR 17X24 6PK STRL BLUE (TOWEL DISPOSABLE) ×2 IMPLANT
TOWEL OR 17X26 10 PK STRL BLUE (TOWEL DISPOSABLE) ×2 IMPLANT
TUBE CONNECTING 12X1/4 (SUCTIONS) ×2 IMPLANT
WATER STERILE IRR 1000ML POUR (IV SOLUTION) ×2 IMPLANT

## 2012-05-10 NOTE — Op Note (Signed)
05/10/2012  4:33 PM  PATIENT: Kendra Nelson 51 y.o. female  PRE-OPERATIVE DIAGNOSIS: Lumbar radiculopathy, Lumbago, chronic pain with depleted implantable intrathecal pump  POST-OPERATIVE DIAGNOSIS: Lumbar radiculopathy, Lumbago, chronic pain with depleted implantable intrathecal pump  PROCEDURE: Procedure(s) (LRB) with comments:  PAIN PUMP INSERTION (N/A) - Pump replacement  SURGEON: Surgeon(s) and Role:  * Maeola Harman, MD - Primary  PHYSICIAN ASSISTANT:  ASSISTANTS: none  ANESTHESIA: MAC  EBL: Total I/O  In: 900 [I.V.:900]  Out: -  BLOOD ADMINISTERED:none  DRAINS: none  LOCAL MEDICATIONS USED: LIDOCAINE  SPECIMEN: No Specimen  DISPOSITION OF SPECIMEN: N/A  COUNTS: YES  TOURNIQUET: * No tourniquets in log *  DICTATION: DICTATION: Patient has implanted intrathecal pump for chronic pain, which is now depleted. It was elected for patient to undergo intrathecal pump revision.  PROCEDURE: Patient was brought to the operating room and given intravenous sedation. Left lower quadrant was prepped with betadine scrub and paint. Area of planned incision was infiltrated with marcaine. Prior incision was reopened and the old baclofen pump was externalized. Catheter was disconnected. There was spontaneous flow of CSF. The new pump was filled and connected to the catheter. The new pump was then internalized. Wound was irrigated. Then irrigated once more. Incision was closed with 2-0 Vicryl and 3-0 vicryl sutures and dressed with a sterile occlusive dressing. Counts were correct at the end of the case.  PLAN OF CARE: Discharge to home after PACU  PATIENT DISPOSITION: PACU - hemodynamically stable.  Delay start of Pharmacological VTE agent (>24hrs) due to surgical blood loss or risk of bleeding: yes

## 2012-05-10 NOTE — Preoperative (Signed)
Beta Blockers   Reason not to administer Beta Blockers:Not Applicable 

## 2012-05-10 NOTE — Brief Op Note (Signed)
05/10/2012  4:33 PM  PATIENT: Kendra Nelson 51 y.o. female  PRE-OPERATIVE DIAGNOSIS: Lumbar radiculopathy, Lumbago, chronic pain with depleted implantable intrathecal pump  POST-OPERATIVE DIAGNOSIS: Lumbar radiculopathy, Lumbago, chronic pain with depleted implantable intrathecal pump  PROCEDURE: Procedure(s) (LRB) with comments:  PAIN PUMP INSERTION (N/A) - Pump replacement  SURGEON: Surgeon(s) and Role:  * Belinda Bringhurst, MD - Primary  PHYSICIAN ASSISTANT:  ASSISTANTS: none  ANESTHESIA: MAC  EBL: Total I/O  In: 900 [I.V.:900]  Out: -  BLOOD ADMINISTERED:none  DRAINS: none  LOCAL MEDICATIONS USED: LIDOCAINE  SPECIMEN: No Specimen  DISPOSITION OF SPECIMEN: N/A  COUNTS: YES  TOURNIQUET: * No tourniquets in log *  DICTATION: DICTATION: Patient has implanted intrathecal pump for chronic pain, which is now depleted. It was elected for patient to undergo intrathecal pump revision.  PROCEDURE: Patient was brought to the operating room and given intravenous sedation. Left lower quadrant was prepped with betadine scrub and paint. Area of planned incision was infiltrated with marcaine. Prior incision was reopened and the old baclofen pump was externalized. Catheter was disconnected. There was spontaneous flow of CSF. The new pump was filled and connected to the catheter. The new pump was then internalized. Wound was irrigated. Then irrigated once more. Incision was closed with 2-0 Vicryl and 3-0 vicryl sutures and dressed with a sterile occlusive dressing. Counts were correct at the end of the case.  PLAN OF CARE: Discharge to home after PACU  PATIENT DISPOSITION: PACU - hemodynamically stable.  Delay start of Pharmacological VTE agent (>24hrs) due to surgical blood loss or risk of bleeding: yes     

## 2012-05-10 NOTE — Anesthesia Preprocedure Evaluation (Signed)
Anesthesia Evaluation  Patient identified by MRN, date of birth, ID band Patient awake    Reviewed: Allergy & Precautions, H&P , NPO status , Patient's Chart, lab work & pertinent test results  Airway Mallampati: II  Neck ROM: full    Dental   Pulmonary          Cardiovascular     Neuro/Psych  Headaches,  Neuromuscular disease    GI/Hepatic   Endo/Other    Renal/GU      Musculoskeletal  (+) Arthritis -,   Abdominal   Peds  Hematology   Anesthesia Other Findings   Reproductive/Obstetrics                           Anesthesia Physical Anesthesia Plan  ASA: II  Anesthesia Plan: MAC   Post-op Pain Management:    Induction: Intravenous  Airway Management Planned: Simple Face Mask  Additional Equipment:   Intra-op Plan:   Post-operative Plan:   Informed Consent: I have reviewed the patients History and Physical, chart, labs and discussed the procedure including the risks, benefits and alternatives for the proposed anesthesia with the patient or authorized representative who has indicated his/her understanding and acceptance.     Plan Discussed with: CRNA and Surgeon  Anesthesia Plan Comments:         Anesthesia Quick Evaluation

## 2012-05-10 NOTE — Transfer of Care (Signed)
Immediate Anesthesia Transfer of Care Note  Patient: Kendra Nelson  Procedure(s) Performed: Procedure(s) (LRB) with comments: PAIN PUMP INSERTION (N/A) - Pump replacement  Patient Location: PACU  Anesthesia Type: MAC  Level of Consciousness: awake, oriented, patient cooperative and responds to stimulation  Airway & Oxygen Therapy: Patient Spontanous Breathing and Patient connected to nasal cannula oxygen  Post-op Assessment: Report given to PACU RN and Post -op Vital signs reviewed and stable  Post vital signs: Reviewed and stable  Complications: No apparent anesthesia complications

## 2012-05-10 NOTE — Anesthesia Postprocedure Evaluation (Signed)
Anesthesia Post Note  Patient: Kendra Nelson  Procedure(s) Performed: Procedure(s) (LRB): PAIN PUMP INSERTION (N/A)  Anesthesia type: MAC  Patient location: PACU  Post pain: Pain level controlled and Adequate analgesia  Post assessment: Post-op Vital signs reviewed, Patient's Cardiovascular Status Stable and Respiratory Function Stable  Last Vitals:  Filed Vitals:   05/10/12 1630  BP: 105/82  Pulse: 62  Temp:   Resp: 17    Post vital signs: Reviewed and stable  Level of consciousness: awake, alert  and oriented  Complications: No apparent anesthesia complications

## 2012-05-10 NOTE — Interval H&P Note (Signed)
History and Physical Interval Note:  05/10/2012 6:33 AM  Kendra Nelson  has presented today for surgery, with the diagnosis of Lumbar radiculopathy, Lumbago  The various methods of treatment have been discussed with the patient and family. After consideration of risks, benefits and other options for treatment, the patient has consented to  Procedure(s) (LRB) with comments: PAIN PUMP INSERTION (N/A) - Pump replacement as a surgical intervention .  The patient's history has been reviewed, patient examined, no change in status, stable for surgery.  I have reviewed the patient's chart and labs.  Questions were answered to the patient's satisfaction.     [name] D  Date of Initial H&P: 05/09/2012  History reviewed, patient examined, no change in status, stable for surgery.

## 2012-05-11 NOTE — Discharge Summary (Signed)
Physician Discharge Summary  Patient ID: Kendra Nelson MRN: [id number] DOB/AGE: 50-Jan-1962 50 y.o.  Admit date: 05/10/2012 Discharge date: 05/11/2012  Admission Diagnoses:  Discharge Diagnoses:  Active Problems:  * No active hospital problems. *    Discharged Condition: good  Hospital Course: Replacement of implantable pain pump  Consults: None  Significant Diagnostic Studies: None  Treatments: surgery: Replacement of implantable pain pump  Discharge Exam: Blood pressure 102/59, pulse 53, temperature 98.4 F (36.9 C), resp. rate 16, SpO2 92.00%. Neurologic: Alert and oriented X 3, normal strength and tone. Normal symmetric reflexes. Normal coordination and gait Wound CDI  Disposition: Home     Medication List     As of 05/11/2012  8:30 AM    ASK your doctor about these medications         C-1000/ROSE HIPS 1000 MG tablet   Generic drug: ascorbic acid   Take 1,000 mg by mouth 2 (two) times daily.      diazepam 10 MG tablet   Commonly known as: VALIUM   Take 10 mg by mouth 3 (three) times daily.      fentaNYL 100 MCG/HR   Commonly known as: DURAGESIC - dosed mcg/hr   Place 1 patch onto the skin every 3 (three) days.      multivitamin with minerals Tabs   Take 1 tablet by mouth daily.      oxyCODONE 15 MG immediate release tablet   Commonly known as: ROXICODONE   Take 15 mg by mouth 3 (three) times daily.         Signed: Dorian Heckle, MD 05/11/2012, 8:30 AM

## 2012-05-12 ENCOUNTER — Encounter (HOSPITAL_COMMUNITY): Payer: Self-pay | Admitting: Neurosurgery

## 2012-12-25 ENCOUNTER — Encounter (HOSPITAL_COMMUNITY): Payer: Self-pay | Admitting: Nurse Practitioner

## 2012-12-25 ENCOUNTER — Emergency Department (HOSPITAL_COMMUNITY): Payer: Medicare Other

## 2012-12-25 ENCOUNTER — Emergency Department (HOSPITAL_COMMUNITY)
Admission: EM | Admit: 2012-12-25 | Discharge: 2012-12-25 | Disposition: A | Discharge status: Home / Self Care | Payer: Medicare Other | Attending: Emergency Medicine | Admitting: Emergency Medicine

## 2012-12-25 DIAGNOSIS — R200 Anesthesia of skin: Secondary | ICD-10-CM

## 2012-12-25 DIAGNOSIS — R5381 Other malaise: Secondary | ICD-10-CM | POA: Insufficient documentation

## 2012-12-25 DIAGNOSIS — R209 Unspecified disturbances of skin sensation: Secondary | ICD-10-CM | POA: Insufficient documentation

## 2012-12-25 DIAGNOSIS — Z8739 Personal history of other diseases of the musculoskeletal system and connective tissue: Secondary | ICD-10-CM | POA: Insufficient documentation

## 2012-12-25 DIAGNOSIS — R1310 Dysphagia, unspecified: Secondary | ICD-10-CM | POA: Insufficient documentation

## 2012-12-25 DIAGNOSIS — F17200 Nicotine dependence, unspecified, uncomplicated: Secondary | ICD-10-CM | POA: Insufficient documentation

## 2012-12-25 DIAGNOSIS — Z8669 Personal history of other diseases of the nervous system and sense organs: Secondary | ICD-10-CM | POA: Insufficient documentation

## 2012-12-25 DIAGNOSIS — G8929 Other chronic pain: Secondary | ICD-10-CM | POA: Insufficient documentation

## 2012-12-25 DIAGNOSIS — Z8679 Personal history of other diseases of the circulatory system: Secondary | ICD-10-CM | POA: Insufficient documentation

## 2012-12-25 DIAGNOSIS — Z79899 Other long term (current) drug therapy: Secondary | ICD-10-CM | POA: Insufficient documentation

## 2012-12-25 LAB — BASIC METABOLIC PANEL
CO2: 25 mEq/L (ref 19–32)
Chloride: 101 mEq/L (ref 96–112)
Creatinine, Ser: 0.84 mg/dL (ref 0.50–1.10)
GFR calc Af Amer: >90 mL/min (ref >90)
Potassium: 3.9 mEq/L (ref 3.5–5.1)
Sodium: 140 mEq/L (ref 135–145)

## 2012-12-25 LAB — CBC
MCV: 87.7 fL (ref 78.0–100.0)
Platelets: 299 10*3/uL (ref 150–400)
RBC: 5.11 MIL/uL (ref 3.87–5.11)
RDW: 13.5 % (ref 11.5–15.5)
WBC: 11 10*3/uL — ABNORMAL HIGH (ref 4.0–10.5)

## 2012-12-25 LAB — POCT I-STAT TROPONIN I: Troponin i, poc: 0 ng/mL (ref 0.00–0.08)

## 2012-12-25 NOTE — ED Notes (Signed)
Wait time discussed 

## 2012-12-25 NOTE — ED Provider Notes (Signed)
History     CSN: [id number]  Arrival date & time 12/25/12  1442   First MD Initiated Contact with Patient 12/25/12 1825      Chief Complaint  Patient presents with  . Chest Pain    (Consider location/radiation/quality/duration/timing/severity/associated sxs/prior treatment) HPI Comments: 52 year old female with a history of chronic back pain who has an implantable fentanyl pump on the spinal cord stimulator who also takes chronic Valium and Roxicodone. She has been out of her Valium for approximately 6 days and since that time has had a gradual onset, persistent uneasy feeling as well as a feeling of numbness in her arms. She states that yesterday she first felt a feeling of weakness and numbness in her left upper extremity and today this has spread and has bilateral upper extremity numbness. She states is numb feeling rose up onto her neck and her face and had some difficulty with swallowing.    Patient is a 52 y.o. female presenting with chest pain. The history is provided by the patient.  Chest Pain   Past Medical History  Diagnosis Date  . Complication of anesthesia     WOKE DURING BACK SURGERY   . Headache     HX MIGRAINES  . Neuromuscular disorder   . Arthritis     Past Surgical History  Procedure Laterality Date  . Spinal cord stimulator insertion    . Back surgery      8-9 SURGERIES  . Pain pump implantation    . Abdominal hysterectomy    . Pain pump implantation  05/10/2012    Procedure: PAIN PUMP INSERTION;  Surgeon: Maeola Harman, MD;  Location: [facility];  Service: Neurosurgery;  Laterality: N/A;  Pump replacement    History reviewed. No pertinent family history.  History  Substance Use Topics  . Smoking status: Current Every Day Smoker  . Smokeless tobacco: Not on file  . Alcohol Use: No    OB History   Grav Para Term Preterm Abortions TAB SAB Ect Mult Living                  Review of Systems  Cardiovascular: Positive for chest pain.  All  other systems reviewed and are negative.    Allergies  Benadryl  Home Medications   Current Outpatient Rx  Name  Route  Sig  Dispense  Refill  . ascorbic acid (C-1000/ROSE HIPS) 1000 MG tablet   Oral   Take 1,000 mg by mouth 2 (two) times daily.         . diazepam (VALIUM) 10 MG tablet   Oral   Take 10 mg by mouth 3 (three) times daily.         . fentaNYL (DURAGESIC - DOSED MCG/HR) 100 MCG/HR   Transdermal   Place 1 patch onto the skin every 3 (three) days.         . Multiple Vitamin (MULTIVITAMIN WITH MINERALS) TABS   Oral   Take 1 tablet by mouth daily.         Marland Kitchen oxyCODONE (ROXICODONE) 15 MG immediate release tablet   Oral   Take 15 mg by mouth 3 (three) times daily.           BP 166/77  Pulse 103  Temp(Src) 98.5 F (36.9 C) (Oral)  Resp 20  SpO2 95%  Physical Exam  Nursing note and vitals reviewed. Constitutional: She appears well-developed and well-nourished. No distress.  HENT:  Head: Normocephalic and atraumatic.  Mouth/Throat: Oropharynx is  clear and moist. No oropharyngeal exudate.  Eyes: Conjunctivae and EOM are normal. Pupils are equal, round, and reactive to light. Right eye exhibits no discharge. Left eye exhibits no discharge. No scleral icterus.  Neck: Normal range of motion. Neck supple. No JVD present. No thyromegaly present.  Cardiovascular: Normal rate, regular rhythm, normal heart sounds and intact distal pulses.  Exam reveals no gallop and no friction rub.   No murmur heard. Pulmonary/Chest: Effort normal and breath sounds normal. No respiratory distress. She has no wheezes. She has no rales.  Abdominal: Soft. Bowel sounds are normal. She exhibits no distension and no mass. There is no tenderness.  Musculoskeletal: Normal range of motion. She exhibits no edema and no tenderness.  No signs of trauma deformity or swelling to any of the 4 extremities  Lymphadenopathy:    She has no cervical adenopathy.  Neurological: She is alert.  Coordination normal.  Sensation intact to pinprick over the bilateral upper extremities from the neck through the wrists. Slight decrease in sensation to light touch in the bilateral upper extremities but not over the clavicles, not on the neck, not face and on the trunk. There is normal strength of the bilateral upper extremities at all major muscle groups, no pronator drift, no tremor, no asterixis, normal strength and sensation of the lower extremities bilaterally, cranial nerves III through XII are intact, speech is normal memory is intact  Skin: Skin is warm and dry. No rash noted. No erythema.  Psychiatric: She has a normal mood and affect. Her behavior is normal.    ED Course  Procedures (including critical care time)  Labs Reviewed  CBC - Abnormal; Notable for the following:    WBC 11.0 (*)    Hemoglobin 16.1 (*)    All other components within normal limits  BASIC METABOLIC PANEL - Abnormal; Notable for the following:    Glucose, Bld 127 (*)    GFR calc non Af Amer 80 (*)    All other components within normal limits  POCT I-STAT TROPONIN I   Dg Chest 2 View  12/25/2012  *RADIOLOGY REPORT*  Clinical Data: Chest pain.  Headache.  Smoker.  CHEST - 2 VIEW  Comparison: None.  Findings: Mild dextroconvex thoracolumbar scoliosis noted with associated spondylosis.  Dorsal column stimulator noted at the thoracolumbar junction, with lumbar posterolateral rod pedicle screw fixation and suspicion for the spinal catheter.  Attenuated peripheral pulmonary vasculature is compatible with emphysema/COPD.  Cardiac and mediastinal contours appear unremarkable.  No pleural effusion identified.  IMPRESSION:  1.  Suspected emphysema. 2.  Thoracolumbar scoliosis and spondylosis, with the upper lumbar dorsal column stimulator and a spinal catheter extending up into the mid thoracic spinal canal.   Original Report Authenticated By: Gaylyn Rong, M.D.      1. Numbness       MDM  The patient has a  bizarre group of complaints including patchy fluctuating numbness of the upper extremities and onto the neck. She describes a vague sense of not being able to swallow but has no swelling of her mouth or throat, normal vital signs including oxygenation, respiratory rate and blood pressure and no fever. I would achieve this initially to her benzo withdrawal, she has not run out of medication of her fentanyl for her fentanyl pump, she has a pain contract with her pain clinic and thus cannot receive prescriptions, she has declined a dose of her Valium here, she is concerned about stroke given her unilateral weakness yesterday, I was  concerned at this time given her exam and does not fit with this.  There is no chest pain, troponin is normal, blood counts are significant only for a slight leukocytosis of 11,000 and a metabolic panel is normal.  I personally interpreted to 2 view PA and lateral chest x-ray, there is findings concerning for early emphysema with mild hyperexpansion but normal size of the heart, normal mediastinum, no infiltrates and no fractures.  She has no other risk factors for coronary syndrome other than her daily tobacco use. EKG is abnormal with nonspecific ST and T wave abnormalities in the inferior leads as well as the lateral precordial leads V3 through V6. There is no old EKG with which to compare.  ED ECG REPORT  I personally interpreted this EKG   Date: 12/25/2012 15:30 PM  Rate: 97  Rhythm: normal sinus rhythm  QRS Axis: normal  Intervals: normal  ST/T Wave abnormalities: nonspecific ST/T changes  Conduction Disutrbances:none  Narrative Interpretation:   Old EKG Reviewed: none available   ED ECG REPORT  I personally interpreted this EKG   Date: 12/25/2012 1858  Rate: 60   Rhythm: normal sinus rhythm  QRS Axis: normal  Intervals: normal  ST/T Wave abnormalities: normal  Conduction Disutrbances:none  Narrative Interpretation:   Old EKG Reviewed: Patient has had  improvement of the ST and T waves since prior earlier in the day  The patient has refused a CT scan, states that she was to followup with her doctor, I have ordered that there could be other etiologies of her symptoms but she would rather followup. She declines Valium and states that she will get her prescription on Thursday and taken at that time. She has been encouraged to return should her symptoms worsen.       Vida Roller, MD 12/25/12 [phone]

## 2012-12-25 NOTE — ED Notes (Signed)
Pt reports since she ran out of her diazepam last week she has started to have CP, weakness in arms, headaches, "burning pain in body," muscle spasms. Pt takes daily diazepam for back pain and she can not refill her rx until 5/1.

## 2013-08-16 ENCOUNTER — Ambulatory Visit: Payer: Self-pay | Admitting: Pain Medicine

## 2013-09-08 ENCOUNTER — Ambulatory Visit: Payer: Self-pay | Admitting: Pain Medicine

## 2013-09-11 DIAGNOSIS — M62830 Muscle spasm of back: Secondary | ICD-10-CM | POA: Insufficient documentation

## 2013-09-21 ENCOUNTER — Ambulatory Visit: Payer: Self-pay | Admitting: Pain Medicine

## 2013-09-29 ENCOUNTER — Ambulatory Visit: Payer: Self-pay | Admitting: Pain Medicine

## 2013-11-03 ENCOUNTER — Ambulatory Visit: Payer: Self-pay | Admitting: Pain Medicine

## 2013-11-23 ENCOUNTER — Ambulatory Visit: Payer: Self-pay | Admitting: Pain Medicine

## 2013-12-14 ENCOUNTER — Ambulatory Visit: Payer: Self-pay | Admitting: Pain Medicine

## 2013-12-28 ENCOUNTER — Ambulatory Visit: Payer: Self-pay | Admitting: Pain Medicine

## 2014-02-01 ENCOUNTER — Ambulatory Visit: Payer: Self-pay | Admitting: Pain Medicine

## 2014-05-03 ENCOUNTER — Ambulatory Visit: Payer: Self-pay | Admitting: Pain Medicine

## 2014-05-23 DIAGNOSIS — F4001 Agoraphobia with panic disorder: Secondary | ICD-10-CM | POA: Insufficient documentation

## 2014-07-19 ENCOUNTER — Ambulatory Visit: Payer: Self-pay | Admitting: Pain Medicine

## 2014-11-12 DIAGNOSIS — G8929 Other chronic pain: Secondary | ICD-10-CM | POA: Insufficient documentation

## 2014-11-12 DIAGNOSIS — M549 Dorsalgia, unspecified: Secondary | ICD-10-CM

## 2015-02-18 DIAGNOSIS — F5101 Primary insomnia: Secondary | ICD-10-CM | POA: Insufficient documentation

## 2015-04-02 DIAGNOSIS — F4322 Adjustment disorder with anxiety: Secondary | ICD-10-CM | POA: Insufficient documentation

## 2015-04-02 DIAGNOSIS — M5417 Radiculopathy, lumbosacral region: Secondary | ICD-10-CM | POA: Insufficient documentation

## 2015-06-12 ENCOUNTER — Encounter: Payer: Self-pay | Admitting: Pain Medicine

## 2015-08-06 ENCOUNTER — Telehealth: Payer: Self-pay

## 2015-08-06 NOTE — Telephone Encounter (Signed)
Left message with patient regarding her pump refill.  Informed patient that there was not an appointment scheduled for pump refill and her alarm date was 08/27/15.  Asked patient to call and schedule appointment.

## 2016-01-01 DIAGNOSIS — G8929 Other chronic pain: Secondary | ICD-10-CM | POA: Insufficient documentation

## 2016-01-01 DIAGNOSIS — M25562 Pain in left knee: Secondary | ICD-10-CM

## 2016-02-03 DIAGNOSIS — Z1159 Encounter for screening for other viral diseases: Secondary | ICD-10-CM | POA: Insufficient documentation

## 2016-02-03 DIAGNOSIS — Z79899 Other long term (current) drug therapy: Secondary | ICD-10-CM | POA: Insufficient documentation

## 2016-02-03 DIAGNOSIS — R002 Palpitations: Secondary | ICD-10-CM | POA: Insufficient documentation

## 2016-02-06 DIAGNOSIS — E7800 Pure hypercholesterolemia, unspecified: Secondary | ICD-10-CM | POA: Insufficient documentation

## 2016-06-02 ENCOUNTER — Other Ambulatory Visit: Payer: Self-pay | Admitting: Neurosurgery

## 2016-06-04 DIAGNOSIS — Z4542 Encounter for adjustment and management of neuropacemaker (brain) (peripheral nerve) (spinal cord): Secondary | ICD-10-CM | POA: Insufficient documentation

## 2016-06-08 ENCOUNTER — Encounter (HOSPITAL_COMMUNITY): Payer: Self-pay | Admitting: *Deleted

## 2016-06-09 ENCOUNTER — Ambulatory Visit (HOSPITAL_COMMUNITY): Payer: Medicare Other | Admitting: Certified Registered"

## 2016-06-09 ENCOUNTER — Encounter (HOSPITAL_COMMUNITY): Payer: Self-pay | Admitting: *Deleted

## 2016-06-09 ENCOUNTER — Encounter (HOSPITAL_COMMUNITY)
Admission: RE | Disposition: A | Discharge status: Home / Self Care | Payer: Self-pay | Source: Ambulatory Visit | Attending: Neurosurgery

## 2016-06-09 ENCOUNTER — Ambulatory Visit (HOSPITAL_COMMUNITY)
Admission: RE | Admit: 2016-06-09 | Discharge: 2016-06-09 | Disposition: A | Discharge status: Home / Self Care | Payer: Medicare Other | Source: Ambulatory Visit | Attending: Neurosurgery | Admitting: Neurosurgery

## 2016-06-09 DIAGNOSIS — I10 Essential (primary) hypertension: Secondary | ICD-10-CM | POA: Insufficient documentation

## 2016-06-09 DIAGNOSIS — Z79899 Other long term (current) drug therapy: Secondary | ICD-10-CM | POA: Diagnosis not present

## 2016-06-09 DIAGNOSIS — Z4542 Encounter for adjustment and management of neuropacemaker (brain) (peripheral nerve) (spinal cord): Secondary | ICD-10-CM | POA: Insufficient documentation

## 2016-06-09 DIAGNOSIS — M549 Dorsalgia, unspecified: Secondary | ICD-10-CM | POA: Diagnosis not present

## 2016-06-09 DIAGNOSIS — F17200 Nicotine dependence, unspecified, uncomplicated: Secondary | ICD-10-CM | POA: Diagnosis not present

## 2016-06-09 HISTORY — PX: SPINAL CORD STIMULATOR BATTERY EXCHANGE: SHX6202

## 2016-06-09 HISTORY — DX: Anxiety disorder, unspecified: F41.9

## 2016-06-09 LAB — BASIC METABOLIC PANEL
ANION GAP: 6 (ref 5–15)
BUN: 13 mg/dL (ref 6–20)
CALCIUM: 9.2 mg/dL (ref 8.9–10.3)
CO2: 26 mmol/L (ref 22–32)
Chloride: 106 mmol/L (ref 101–111)
Creatinine, Ser: 0.68 mg/dL (ref 0.44–1.00)
Glucose, Bld: 104 mg/dL — ABNORMAL HIGH (ref 65–99)
Potassium: 4 mmol/L (ref 3.5–5.1)
SODIUM: 138 mmol/L (ref 135–145)

## 2016-06-09 LAB — CBC
HCT: 41.1 % (ref 36.0–46.0)
HEMOGLOBIN: 13.6 g/dL (ref 12.0–15.0)
MCH: 30.3 pg (ref 26.0–34.0)
MCHC: 33.1 g/dL (ref 30.0–36.0)
MCV: 91.5 fL (ref 78.0–100.0)
PLATELETS: 255 10*3/uL (ref 150–400)
RBC: 4.49 MIL/uL (ref 3.87–5.11)
RDW: 13.5 % (ref 11.5–15.5)
WBC: 9.7 10*3/uL (ref 4.0–10.5)

## 2016-06-09 SURGERY — SPINAL CORD STIMULATOR BATTERY EXCHANGE
Anesthesia: General

## 2016-06-09 MED ORDER — CHLORHEXIDINE GLUCONATE CLOTH 2 % EX PADS: 6.0000 | MEDICATED_PAD | Freq: Once | CUTANEOUS | Status: DC

## 2016-06-09 MED ORDER — 0.9 % SODIUM CHLORIDE (POUR BTL) OPTIME
TOPICAL | Status: DC | PRN
  Administered 2016-06-09: 1000 mL

## 2016-06-09 MED ORDER — FENTANYL CITRATE (PF) 100 MCG/2ML IJ SOLN
INTRAMUSCULAR | Status: AC
  Filled 2016-06-09: qty 4

## 2016-06-09 MED ORDER — SUGAMMADEX SODIUM 200 MG/2ML IV SOLN
INTRAVENOUS | Status: DC | PRN
  Administered 2016-06-09: 127 mg via INTRAVENOUS

## 2016-06-09 MED ORDER — THROMBIN 5000 UNITS EX SOLR
CUTANEOUS | Status: DC | PRN
  Administered 2016-06-09 (×2): 5000 [IU] via TOPICAL

## 2016-06-09 MED ORDER — CEFAZOLIN SODIUM-DEXTROSE 2-4 GM/100ML-% IV SOLN
2.0000 g | INTRAVENOUS | Status: AC
  Administered 2016-06-09: 2 g via INTRAVENOUS
  Filled 2016-06-09: qty 100

## 2016-06-09 MED ORDER — THROMBIN 20000 UNITS EX SOLR
CUTANEOUS | Status: AC
  Filled 2016-06-09: qty 20000

## 2016-06-09 MED ORDER — LIDOCAINE-EPINEPHRINE 1 %-1:100000 IJ SOLN
INTRAMUSCULAR | Status: AC
  Filled 2016-06-09: qty 1

## 2016-06-09 MED ORDER — VANCOMYCIN HCL 1000 MG IV SOLR
INTRAVENOUS | Status: AC
  Filled 2016-06-09: qty 1000

## 2016-06-09 MED ORDER — THROMBIN 5000 UNITS EX SOLR
CUTANEOUS | Status: AC
  Filled 2016-06-09: qty 5000

## 2016-06-09 MED ORDER — MIDAZOLAM HCL 2 MG/2ML IJ SOLN
INTRAMUSCULAR | Status: AC
  Filled 2016-06-09: qty 2

## 2016-06-09 MED ORDER — BUPIVACAINE HCL (PF) 0.5 % IJ SOLN
INTRAMUSCULAR | Status: AC
  Filled 2016-06-09: qty 30

## 2016-06-09 MED ORDER — LACTATED RINGERS IV SOLN
INTRAVENOUS | Status: DC
  Administered 2016-06-09: 11:00:00 via INTRAVENOUS

## 2016-06-09 MED ORDER — BUPIVACAINE HCL (PF) 0.5 % IJ SOLN
INTRAMUSCULAR | Status: DC | PRN
  Administered 2016-06-09: 9 mL

## 2016-06-09 MED ORDER — LACTATED RINGERS IV SOLN
INTRAVENOUS | Status: DC | PRN
  Administered 2016-06-09 (×2): via INTRAVENOUS

## 2016-06-09 MED ORDER — LIDOCAINE 2% (20 MG/ML) 5 ML SYRINGE
INTRAMUSCULAR | Status: AC
  Filled 2016-06-09: qty 5

## 2016-06-09 MED ORDER — HYDROMORPHONE HCL 1 MG/ML IJ SOLN
INTRAMUSCULAR | Status: AC
  Filled 2016-06-09: qty 1

## 2016-06-09 MED ORDER — LIDOCAINE HCL (CARDIAC) 20 MG/ML IV SOLN
INTRAVENOUS | Status: DC | PRN
  Administered 2016-06-09: 100 mg via INTRAVENOUS

## 2016-06-09 MED ORDER — FENTANYL CITRATE (PF) 100 MCG/2ML IJ SOLN
INTRAMUSCULAR | Status: DC | PRN
  Administered 2016-06-09: 100 ug via INTRAVENOUS

## 2016-06-09 MED ORDER — PROPOFOL 10 MG/ML IV BOLUS
INTRAVENOUS | Status: DC | PRN
  Administered 2016-06-09: 30 mg via INTRAVENOUS
  Administered 2016-06-09: 170 mg via INTRAVENOUS

## 2016-06-09 MED ORDER — PROPOFOL 10 MG/ML IV BOLUS
INTRAVENOUS | Status: AC
  Filled 2016-06-09: qty 20

## 2016-06-09 MED ORDER — ONDANSETRON HCL 4 MG/2ML IJ SOLN
INTRAMUSCULAR | Status: DC | PRN
  Administered 2016-06-09: 4 mg via INTRAVENOUS

## 2016-06-09 MED ORDER — LIDOCAINE-EPINEPHRINE 1 %-1:100000 IJ SOLN
INTRAMUSCULAR | Status: DC | PRN
  Administered 2016-06-09: 9 mL

## 2016-06-09 MED ORDER — ROCURONIUM BROMIDE 10 MG/ML (PF) SYRINGE
PREFILLED_SYRINGE | INTRAVENOUS | Status: AC
  Filled 2016-06-09: qty 10

## 2016-06-09 MED ORDER — SUGAMMADEX SODIUM 200 MG/2ML IV SOLN
INTRAVENOUS | Status: AC
  Filled 2016-06-09: qty 2

## 2016-06-09 MED ORDER — HEMOSTATIC AGENTS (NO CHARGE) OPTIME
TOPICAL | Status: DC | PRN
  Administered 2016-06-09: 1 via TOPICAL

## 2016-06-09 MED ORDER — ROCURONIUM BROMIDE 100 MG/10ML IV SOLN
INTRAVENOUS | Status: DC | PRN
  Administered 2016-06-09: 50 mg via INTRAVENOUS

## 2016-06-09 MED ORDER — MIDAZOLAM HCL 5 MG/5ML IJ SOLN
INTRAMUSCULAR | Status: DC | PRN
  Administered 2016-06-09: 2 mg via INTRAVENOUS

## 2016-06-09 MED ORDER — ONDANSETRON HCL 4 MG/2ML IJ SOLN
INTRAMUSCULAR | Status: AC
  Filled 2016-06-09: qty 2

## 2016-06-09 MED ORDER — VANCOMYCIN HCL 1000 MG IV SOLR
INTRAVENOUS | Status: DC | PRN
  Administered 2016-06-09: 1000 mg

## 2016-06-09 SURGICAL SUPPLY — 56 items
ADAPTER POCKET 2X4 ×2 IMPLANT
ADH SKN CLS APL DERMABOND .7 (GAUZE/BANDAGES/DRESSINGS) ×1
ADPR NRSTM 2X4 SPNL CORD ×1 IMPLANT
BLADE CLIPPER SURG (BLADE) IMPLANT
BUR MATCHSTICK NEURO 3.0 LAGG (BURR) IMPLANT
BUR PRECISION FLUTE 5.0 (BURR) ×3 IMPLANT
CHARGING SYSTEM ×2 IMPLANT
COVER PROBE W GEL 5X96 (DRAPES) ×2 IMPLANT
DECANTER SPIKE VIAL GLASS SM (MISCELLANEOUS) ×3 IMPLANT
DERMABOND ADVANCED (GAUZE/BANDAGES/DRESSINGS) ×2
DERMABOND ADVANCED .7 DNX12 (GAUZE/BANDAGES/DRESSINGS) IMPLANT
DRAPE C-ARM 42X72 X-RAY (DRAPES) ×3 IMPLANT
DRAPE INCISE IOBAN 85X60 (DRAPES) IMPLANT
DRAPE LAPAROTOMY 100X72X124 (DRAPES) ×3 IMPLANT
DRAPE POUCH INSTRU U-SHP 10X18 (DRAPES) ×3 IMPLANT
DRAPE SURG 17X23 STRL (DRAPES) ×3 IMPLANT
DRSG OPSITE POSTOP 4X6 (GAUZE/BANDAGES/DRESSINGS) ×2 IMPLANT
ELECT REM PT RETURN 9FT ADLT (ELECTROSURGICAL) ×3
ELECTRODE REM PT RTRN 9FT ADLT (ELECTROSURGICAL) ×1 IMPLANT
GAUZE SPONGE 4X4 16PLY XRAY LF (GAUZE/BANDAGES/DRESSINGS) IMPLANT
GLOVE BIO SURGEON STRL SZ8 (GLOVE) ×3 IMPLANT
GLOVE BIOGEL PI IND STRL 8 (GLOVE) ×1 IMPLANT
GLOVE BIOGEL PI IND STRL 8.5 (GLOVE) ×1 IMPLANT
GLOVE BIOGEL PI INDICATOR 8 (GLOVE) ×2
GLOVE BIOGEL PI INDICATOR 8.5 (GLOVE) ×2
GLOVE ECLIPSE 7.5 STRL STRAW (GLOVE) ×3 IMPLANT
GLOVE EXAM NITRILE LRG STRL (GLOVE) IMPLANT
GLOVE EXAM NITRILE XL STR (GLOVE) IMPLANT
GLOVE EXAM NITRILE XS STR PU (GLOVE) IMPLANT
GOWN STRL REUS W/ TWL LRG LVL3 (GOWN DISPOSABLE) IMPLANT
GOWN STRL REUS W/ TWL XL LVL3 (GOWN DISPOSABLE) IMPLANT
GOWN STRL REUS W/TWL 2XL LVL3 (GOWN DISPOSABLE) IMPLANT
GOWN STRL REUS W/TWL LRG LVL3 (GOWN DISPOSABLE)
GOWN STRL REUS W/TWL XL LVL3 (GOWN DISPOSABLE)
KIT BASIN OR (CUSTOM PROCEDURE TRAY) ×3 IMPLANT
KIT ROOM TURNOVER OR (KITS) ×3 IMPLANT
NDL HYPO 25X1 1.5 SAFETY (NEEDLE) ×1 IMPLANT
NEEDLE HYPO 25X1 1.5 SAFETY (NEEDLE) ×3 IMPLANT
NS IRRIG 1000ML POUR BTL (IV SOLUTION) ×3 IMPLANT
PACK LAMINECTOMY NEURO (CUSTOM PROCEDURE TRAY) ×3 IMPLANT
PAD ARMBOARD 7.5X6 YLW CONV (MISCELLANEOUS) ×3 IMPLANT
PROGRAMMER PATIENT (MISCELLANEOUS) ×2 IMPLANT
SPONGE LAP 4X18 X RAY DECT (DISPOSABLE) IMPLANT
SPONGE SURGIFOAM ABS GEL SZ50 (HEMOSTASIS) ×3 IMPLANT
STAPLER SKIN PROX WIDE 3.9 (STAPLE) ×3 IMPLANT
STIMULATOR SURESCAN SPINAL (Neurostimulator) ×2 IMPLANT
SUT SILK 0 TIES 10X30 (SUTURE) IMPLANT
SUT SILK 2 0 FS (SUTURE) ×9 IMPLANT
SUT SILK 2 0 TIES 10X30 (SUTURE) ×3 IMPLANT
SUT VIC AB 0 CT1 18XCR BRD8 (SUTURE) ×1 IMPLANT
SUT VIC AB 0 CT1 8-18 (SUTURE) ×3
SUT VIC AB 2-0 CP2 18 (SUTURE) ×3 IMPLANT
SUT VIC AB 3-0 SH 8-18 (SUTURE) ×3 IMPLANT
TOWEL OR 17X24 6PK STRL BLUE (TOWEL DISPOSABLE) ×3 IMPLANT
TOWEL OR 17X26 10 PK STRL BLUE (TOWEL DISPOSABLE) ×3 IMPLANT
WATER STERILE IRR 1000ML POUR (IV SOLUTION) ×3 IMPLANT

## 2016-06-09 NOTE — Transfer of Care (Signed)
Immediate Anesthesia Transfer of Care Note  Patient: Kendra Nelson  Procedure(s) Performed: Procedure(s): IMPLANTABLE PULSE GENERATOR REPLACEMENT WITH RECHARGEABLE BATTERY (N/A)  Patient Location: PACU  Anesthesia Type:General  Level of Consciousness: awake, alert , oriented, patient cooperative and responds to stimulation  Airway & Oxygen Therapy: Patient Spontanous Breathing and Patient connected to nasal cannula oxygen  Post-op Assessment: Report given to RN, Post -op Vital signs reviewed and stable and Patient moving all extremities X 4  Post vital signs: Reviewed and stable  Last Vitals:  Vitals:   06/09/16 0951 06/09/16 1400  BP: (!) 176/76 (!) 143/75  Pulse: 75 81  Resp: 18 18  Temp: 36.6 C 36.8 C    Last Pain:  Vitals:   06/09/16 0951  TempSrc: Oral         Complications: No apparent anesthesia complications

## 2016-06-09 NOTE — Interval H&P Note (Signed)
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 After consideration of risks, benefits and other options for treatment, the patient has consented to  Procedure(s) with comments: IMPLANTABLE PULSE GENERATOR REPLACEMENT WITH RECHARGEABLE BATTERY (N/A) - IMPLANTABLE PULSE GENERATOR REPLACEMENT WITH RECHARGEABLE BATTERY as a surgical intervention .  The patient's history has been reviewed, patient examined, no change in status, stable for surgery.  I have reviewed the patient's chart and labs.  Questions were answered to the patient's satisfaction.     Krystol Rocco D

## 2016-06-09 NOTE — H&P (Signed)
Patient ID:   [id number] Patient: Kendra Nelson  Date of Birth: [birth year] Visit Type: Office Visit   Date: 06/01/2016 01:00 PM Provider: Danae Orleans. Venetia Maxon MD   This 55 year old female presents for Back pain.  History of Present Illness: 1.  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  She continues to follow with comprehensive pain specialist in [facility] (Dr. Marlynn Perking) for spinal cord stimulator as well as intrathecal pain pump.  Last changed in 2009, her IPG is nearing end of life, and she wishes to replace it with a rechargeable battery.  Her pain pump last changed in 2013, continues to work well and requires no attention this visit.  Carlus Pavlov is present from [employer] to assist in programming. SCS battery right abdomen... Intrathecal pump left abdomen  Oxycodone 15 mg 3 times a day Flexeril 10 mg one per day  History: HTN Surgical history: Intrathecal pump 2007 and 2013 by Dr. Venetia Maxon; IPG changes 2008 and 2009 by Dr. Venetia Maxon (originally placed elsewhere years ago)       PAST MEDICAL HISTORY, SURGICAL HISTORY, FAMILY HISTORY, SOCIAL HISTORY AND REVIEW OF SYSTEMS  06/01/2016, which I have signed.   MEDICATIONS(added, continued or stopped this visit):   ALLERGIES: Ingredient Reaction Medication Name Comment  DIPHENHYDRAMINE HCL  Benadryl    Reviewed, updated.    Vitals Date Temp F BP Pulse Ht In Wt Lb BMI BSA Pain Score  06/01/2016  168/104 74 63 167.6 29.69  4/10      IMPRESSION The patient's IPG is nearing the end of life, so it will have to be changed. We will schedule IPG replacement with rechargeable battery for 10/10   Schedule IPG replacement with rechargeable battery for 10/10. Follow up 2 weeks after.              Provider:  Danae Orleans. Venetia Maxon MD  06/01/2016 02:30 PM Dictation edited by: Gabriel Rainwater    CC Providers: Tracey Harries [facility] [address] [place],  [place]  [address]   Charmayne Sheer [facility] [address] [place], [place] [address]              Electronically signed by Danae Orleans. Venetia Maxon MD on 06/02/2016 04:52 PM

## 2016-06-09 NOTE — Anesthesia Procedure Notes (Signed)
Procedure Name: Intubation Date/Time: 06/09/2016 1:05 PM Performed by: Virgel Gess LEFFEW Pre-anesthesia Checklist: Patient identified, Patient being monitored, Timeout performed, Emergency Drugs available and Suction available Patient Re-evaluated:Patient Re-evaluated prior to inductionOxygen Delivery Method: Circle System Utilized Preoxygenation: Pre-oxygenation with 100% oxygen Intubation Type: IV induction Ventilation: Mask ventilation without difficulty and Oral airway inserted - appropriate to patient size Laryngoscope Size: Mac and 3 Grade View: Grade II Tube type: Oral Tube size: 7.0 mm Number of attempts: 1 Airway Equipment and Method: Stylet Placement Confirmation: ETT inserted through vocal cords under direct vision,  positive ETCO2 and breath sounds checked- equal and bilateral Secured at: 22 cm Tube secured with: Tape Dental Injury: Teeth and Oropharynx as per pre-operative assessment

## 2016-06-09 NOTE — Brief Op Note (Signed)
06/09/2016  1:56 PM  PATIENT:  Kendra Nelson  55 y.o. 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  It was elected for patient to undergo IPG revision.  PROCEDURE: Patient was brought to the operating room and given general anesthesia.  Right lower quadrant of abdomen was prepped with betadine scrub and Duraprep.  Area of planned incision was infiltrated with lidocaine.  Prior incision was reopened and the old IPG was externalized.  Adaptor was connected to new IPG which was placed in the pocket.  Wound was irrigated with vancomycin. Then irrigated once more.  Incision was closed with 2-0 Vicryl and 3-0 vicryl sutures and dressed with a sterile occlusive dressing.  Counts were correct at the end of the case.  PLAN OF CARE: Discharge to home after PACU  PATIENT DISPOSITION:  PACU - hemodynamically stable.   Delay start of Pharmacological VTE agent (>24hrs) due to surgical blood loss or risk of bleeding: yes  

## 2016-06-09 NOTE — Op Note (Signed)
06/09/2016  1:56 PM  PATIENT:  Kendra Nelson  55 y.o. 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  It was elected for patient to undergo IPG revision.  PROCEDURE: Patient was brought to the operating room and given general anesthesia.  Right lower quadrant of abdomen was prepped with betadine scrub and Duraprep.  Area of planned incision was infiltrated with lidocaine.  Prior incision was reopened and the old IPG was externalized.  Adaptor was connected to new IPG which was placed in the pocket.  Wound was irrigated with vancomycin. Then irrigated once more.  Incision was closed with 2-0 Vicryl and 3-0 vicryl sutures and dressed with a sterile occlusive dressing.  Counts were correct at the end of the case.  PLAN OF CARE: Discharge to home after PACU  PATIENT DISPOSITION:  PACU - hemodynamically stable.   Delay start of Pharmacological VTE agent (>24hrs) due to surgical blood loss or risk of bleeding: yes

## 2016-06-09 NOTE — Anesthesia Preprocedure Evaluation (Addendum)
Anesthesia Evaluation  Patient identified by MRN, date of birth, ID band Patient awake    Reviewed: Allergy & Precautions, NPO status , Patient's Chart, lab work & pertinent test results  History of Anesthesia Complications Negative for: history of anesthetic complications  Airway Mallampati: II   Neck ROM: Full    Dental  (+) Teeth Intact, Dental Advisory Given   Pulmonary neg pulmonary ROS, Current Smoker,    breath sounds clear to auscultation       Cardiovascular negative cardio ROS   Rhythm:Regular Rate:Normal     Neuro/Psych  Headaches,    GI/Hepatic negative GI ROS, Neg liver ROS,   Endo/Other  negative endocrine ROS  Renal/GU negative Renal ROS     Musculoskeletal  (+) Arthritis ,   Abdominal   Peds  Hematology   Anesthesia Other Findings   Reproductive/Obstetrics                            Anesthesia Physical Anesthesia Plan  ASA: III  Anesthesia Plan: General   Post-op Pain Management:    Induction: Intravenous  Airway Management Planned: Oral ETT  Additional Equipment:   Intra-op Plan:   Post-operative Plan: Extubation in OR  Informed Consent: I have reviewed the patients History and Physical, chart, labs and discussed the procedure including the risks, benefits and alternatives for the proposed anesthesia with the patient or authorized representative who has indicated his/her understanding and acceptance.     Plan Discussed with: CRNA and Anesthesiologist  Anesthesia Plan Comments:         Anesthesia Quick Evaluation

## 2016-06-09 NOTE — Anesthesia Preprocedure Evaluation (Addendum)
Anesthesia Evaluation  Patient identified by MRN, date of birth, ID band Patient awake    Reviewed: Allergy & Precautions, NPO status , Patient's Chart, lab work & pertinent test results  Airway Mallampati: II  TM Distance: >3 FB     Dental   Pulmonary Current Smoker,    breath sounds clear to auscultation       Cardiovascular negative cardio ROS   Rhythm:Regular Rate:Normal     Neuro/Psych  Headaches,    GI/Hepatic negative GI ROS, Neg liver ROS,   Endo/Other  negative endocrine ROS  Renal/GU negative Renal ROS     Musculoskeletal  (+) Arthritis ,   Abdominal   Peds  Hematology   Anesthesia Other Findings   Reproductive/Obstetrics                             Anesthesia Physical Anesthesia Plan  ASA: III  Anesthesia Plan: General   Post-op Pain Management:    Induction: Intravenous  Airway Management Planned: Oral ETT  Additional Equipment:   Intra-op Plan:   Post-operative Plan: Possible Post-op intubation/ventilation  Informed Consent: I have reviewed the patients History and Physical, chart, labs and discussed the procedure including the risks, benefits and alternatives for the proposed anesthesia with the patient or authorized representative who has indicated his/her understanding and acceptance.   Dental advisory given  Plan Discussed with: CRNA and Anesthesiologist  Anesthesia Plan Comments:         Anesthesia Quick Evaluation

## 2016-06-11 ENCOUNTER — Encounter (HOSPITAL_COMMUNITY): Payer: Self-pay | Admitting: Neurosurgery

## 2016-06-11 NOTE — Anesthesia Postprocedure Evaluation (Signed)
Anesthesia Post Note  Patient: Kendra Nelson  Procedure(s) Performed: Procedure(s) (LRB): IMPLANTABLE PULSE GENERATOR REPLACEMENT WITH RECHARGEABLE BATTERY (N/A)  Patient location during evaluation: PACU Anesthesia Type: General Level of consciousness: awake Pain management: pain level controlled Vital Signs Assessment: post-procedure vital signs reviewed and stable Respiratory status: spontaneous breathing Cardiovascular status: stable Anesthetic complications: no    Last Vitals:  Vitals:   06/09/16 1455 06/09/16 1459  BP: 114/79 138/76  Pulse: 69 66  Resp: 17   Temp: 36.7 C     Last Pain:  Vitals:   06/09/16 0951  TempSrc: Oral                 EDWARDS,Rafay Dahan

## 2016-08-26 DIAGNOSIS — L409 Psoriasis, unspecified: Secondary | ICD-10-CM | POA: Insufficient documentation

## 2016-08-26 DIAGNOSIS — Z23 Encounter for immunization: Secondary | ICD-10-CM | POA: Insufficient documentation

## 2016-11-24 DIAGNOSIS — F17210 Nicotine dependence, cigarettes, uncomplicated: Secondary | ICD-10-CM | POA: Insufficient documentation

## 2016-11-24 DIAGNOSIS — L409 Psoriasis, unspecified: Secondary | ICD-10-CM | POA: Insufficient documentation

## 2016-12-10 ENCOUNTER — Other Ambulatory Visit (HOSPITAL_COMMUNITY): Payer: Self-pay | Admitting: Family Medicine

## 2016-12-10 DIAGNOSIS — M7989 Other specified soft tissue disorders: Principal | ICD-10-CM

## 2016-12-10 DIAGNOSIS — M79605 Pain in left leg: Secondary | ICD-10-CM

## 2016-12-11 ENCOUNTER — Ambulatory Visit (HOSPITAL_COMMUNITY)
Admission: RE | Admit: 2016-12-11 | Discharge: 2016-12-11 | Disposition: A | Discharge status: Home / Self Care | Payer: Medicare Other | Source: Ambulatory Visit | Attending: Family Medicine | Admitting: Family Medicine

## 2016-12-11 DIAGNOSIS — M79605 Pain in left leg: Secondary | ICD-10-CM | POA: Insufficient documentation

## 2016-12-11 DIAGNOSIS — M7989 Other specified soft tissue disorders: Secondary | ICD-10-CM | POA: Diagnosis not present

## 2016-12-11 NOTE — Progress Notes (Signed)
*  Preliminary Results* Left lower extremity venous duplex completed. Left lower extremity is negative for deep vein thrombosis. There is no evidence of left Baker's cyst.  Incidental finding: In the area of concern, the left lateral proximal anterior knee, there is a heterogenous area anterior to the knee, suggestive of a possible complex fluid collection. Etiology is unknown.  12/11/2016 9:51 AM  Gertie Fey, BS, RVT, RDCS, RDMS

## 2017-05-25 DIAGNOSIS — G8929 Other chronic pain: Secondary | ICD-10-CM | POA: Insufficient documentation

## 2017-05-25 DIAGNOSIS — Z79891 Long term (current) use of opiate analgesic: Secondary | ICD-10-CM | POA: Insufficient documentation

## 2017-05-25 DIAGNOSIS — M79605 Pain in left leg: Secondary | ICD-10-CM

## 2017-05-25 DIAGNOSIS — F1190 Opioid use, unspecified, uncomplicated: Secondary | ICD-10-CM | POA: Insufficient documentation

## 2017-05-25 DIAGNOSIS — M79604 Pain in right leg: Secondary | ICD-10-CM

## 2017-05-25 DIAGNOSIS — G894 Chronic pain syndrome: Secondary | ICD-10-CM | POA: Insufficient documentation

## 2017-05-25 DIAGNOSIS — Z451 Encounter for adjustment and management of infusion pump: Secondary | ICD-10-CM | POA: Insufficient documentation

## 2017-05-25 DIAGNOSIS — Z789 Other specified health status: Secondary | ICD-10-CM | POA: Insufficient documentation

## 2017-05-25 DIAGNOSIS — M5441 Lumbago with sciatica, right side: Secondary | ICD-10-CM

## 2017-05-25 DIAGNOSIS — M5442 Lumbago with sciatica, left side: Secondary | ICD-10-CM

## 2017-05-25 DIAGNOSIS — Z978 Presence of other specified devices: Secondary | ICD-10-CM | POA: Insufficient documentation

## 2017-05-25 DIAGNOSIS — M899 Disorder of bone, unspecified: Secondary | ICD-10-CM | POA: Insufficient documentation

## 2017-05-25 DIAGNOSIS — M961 Postlaminectomy syndrome, not elsewhere classified: Secondary | ICD-10-CM | POA: Insufficient documentation

## 2017-05-25 NOTE — Progress Notes (Signed)
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 drugs. Alcohol:  reports that she does not drink alcohol. Tobacco:  reports that she has been smoking.  She has a 25.00 pack-year smoking history. She has never used smokeless tobacco. Medical:  has a past medical history of Allergy; Anxiety; Arthritis; Complication of anesthesia; Headache(784.0); Hyperlipidemia; Neuromuscular disorder ([place]); and Skin abnormalities. Family: family history includes Aortic aneurysm in her mother; Diabetes in her maternal grandmother.  Past Surgical History:  Procedure  Laterality Date  . ABDOMINAL HYSTERECTOMY    . BACK SURGERY     8-9 SURGERIES  . COLONOSCOPY    . PAIN PUMP IMPLANTATION    . PAIN PUMP IMPLANTATION  05/10/2012   Procedure: PAIN PUMP INSERTION;  Surgeon: Erline Levine, MD;  Location: [facility];  Service: Neurosurgery;  Laterality: N/A;  Pump replacement  . SPINAL CORD STIMULATOR BATTERY EXCHANGE N/A 06/09/2016   Procedure: IMPLANTABLE PULSE GENERATOR REPLACEMENT WITH  RECHARGEABLE BATTERY;  Surgeon: Erline Levine, MD;  Location: [facility];  Service: Neurosurgery;  Laterality: N/A;  . SPINAL CORD STIMULATOR INSERTION     Active Ambulatory Problems    Diagnosis Date Noted  . Unspecified constipation 10/05/2007  . Abnormal weight gain 10/05/2007  . Adjustment disorder with anxiety 04/02/2015  . Agoraphobia with panic disorder 05/23/2014  . Benign essential hypertension 11/06/2011  . Chronic back pain greater than 3 months duration 11/12/2014  . Chronic knee pain (Left) 01/01/2016  . Cigarette nicotine dependence without complication 45/36/4680  . Constipation due to opioid therapy 02/10/2007  . Chronic lumbosacral radiculopathy (L5) (Left) 04/02/2015  . Muscle spasm of back 09/11/2013  . Need for influenza vaccination 08/26/2016  . Need for hepatitis C screening test 02/03/2016  . Palpitations 02/03/2016  . Primary insomnia 02/18/2015  . Psoriasis 11/24/2016  . 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  . Pure hypercholesterolemia 02/06/2016  . Pharmacologic therapy 02/03/2016  . Presence of intrathecal pump 05/25/2017  . Encounter for adjustment or management of infusion pump 05/25/2017  . Disorder of skeletal system 05/25/2017  . Chronic pain syndrome 05/25/2017  . Problems influencing health status 05/25/2017  . 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 Chronic low back pain (Primary Area of Pain) (Bilateral) (R>L) 05/25/2017  . Chronic lower extremity pain (Secondary Area of Pain) (Bilateral) (R>L) 05/25/2017  . Long term prescription opiate use 05/25/2017  . Opiate use 05/25/2017  . Neurogenic pain 05/26/2017  . Neuropathic pain 05/26/2017  . Chronic musculoskeletal pain 05/26/2017   Resolved Ambulatory Problems    Diagnosis Date Noted  . No Resolved Ambulatory Problems   Past Medical History:  Diagnosis Date  . Allergy   . Anxiety   . Arthritis   . Complication of anesthesia   . Headache(784.0)   . Hyperlipidemia   . Neuromuscular disorder  ([place])   . Skin abnormalities    Constitutional Exam  General appearance: Well nourished, well developed, and well hydrated. In no apparent acute distress Vitals:   05/26/17 1049  BP: (!) 182/85  Pulse: 64  Resp: 16  Temp: 98 F (36.7 C)  SpO2: 99%  Weight: 143 lb (64.9 kg)  Height: _0  (1.6 m)   BMI Assessment: Estimated body mass index is 25.33 kg/m as calculated from the following:   Height as of this encounter: _1  (1.6 m).   Weight as of this encounter: 143 lb (64.9 kg).  BMI interpretation table: BMI level Category Range association with higher incidence of chronic pain  <18 kg/m2 Underweight   18.5-24.9 kg/m2 Ideal body weight   25-29.9 kg/m2 Overweight Increased incidence by 20%  30-34.9 kg/m2 Obese (Class I) Increased incidence by 68%  35-39.9 kg/m2 Severe obesity (Class II) Increased incidence by 136%  >40 kg/m2 Extreme obesity (Class III) Increased incidence by 254%   BMI Readings from Last 4 Encounters:  05/26/17 25.33 kg/m  06/09/16 24.80 kg/m  05/05/12 24.96 kg/m   Wt Readings from Last 4 Encounters:  05/26/17 143 lb (64.9 kg)  06/09/16  140 lb (63.5 kg)  05/05/12 140 lb 14.4 oz (63.9 kg)  Psych/Mental status: Alert, oriented x 3 (person, place, & time)       Eyes: PERLA Respiratory: No evidence of acute respiratory distress  Cervical Spine Area Exam  Skin & Axial Inspection: No masses, redness, edema, swelling, or associated skin lesions Alignment: Symmetrical Functional ROM: Unrestricted ROM      Stability: No instability detected Muscle Tone/Strength: Functionally intact. No obvious neuro-muscular anomalies detected. Sensory (Neurological): Unimpaired Palpation: No palpable anomalies              Upper Extremity (UE) Exam    Side: Right upper extremity  Side: Left upper extremity  Skin & Extremity Inspection: Skin color, temperature, and hair growth are WNL. No peripheral edema or cyanosis. No masses, redness, swelling, asymmetry, or  associated skin lesions. No contractures.  Skin & Extremity Inspection: Skin color, temperature, and hair growth are WNL. No peripheral edema or cyanosis. No masses, redness, swelling, asymmetry, or associated skin lesions. No contractures.  Functional ROM: Unrestricted ROM          Functional ROM: Unrestricted ROM          Muscle Tone/Strength: Functionally intact. No obvious neuro-muscular anomalies detected.  Muscle Tone/Strength: Functionally intact. No obvious neuro-muscular anomalies detected.  Sensory (Neurological): Unimpaired          Sensory (Neurological): Unimpaired          Palpation: No palpable anomalies              Palpation: No palpable anomalies              Specialized Test(s): Deferred         Specialized Test(s): Deferred          Thoracic Spine Area Exam  Skin & Axial Inspection: No masses, redness, or swelling Alignment: Symmetrical Functional ROM: Unrestricted ROM Stability: No instability detected Muscle Tone/Strength: Functionally intact. No obvious neuro-muscular anomalies detected. Sensory (Neurological): Unimpaired Muscle strength & Tone: No palpable anomalies  Lumbar Spine Area Exam  Skin & Axial Inspection: Well healed scar from previous spine surgery detected Alignment: Symmetrical Functional ROM: Decreased ROM      Stability: No instability detected Muscle Tone/Strength: Functionally intact. No obvious neuro-muscular anomalies detected. Sensory (Neurological): Movement-associated discomfort Palpation: Complains of area being tender to palpation       Provocative Tests: Lumbar Hyperextension and rotation test: evaluation deferred today       Lumbar Lateral bending test: evaluation deferred today       Patrick's Maneuver: evaluation deferred today                    Gait & Posture Assessment  Ambulation: Unassisted Gait: Relatively normal for age and body habitus Posture: WNL   Lower Extremity Exam    Side: Right lower extremity  Side: Left lower  extremity  Skin & Extremity Inspection: Skin color, temperature, and hair growth are WNL. No peripheral edema or cyanosis. No masses, redness, swelling, asymmetry, or associated skin lesions. No contractures.  Skin & Extremity Inspection: Skin color, temperature, and hair growth are WNL. No peripheral edema or cyanosis. No masses, redness, swelling, asymmetry, or associated skin lesions. No contractures.  Functional ROM: Unrestricted ROM          Functional ROM: Unrestricted ROM          Muscle Tone/Strength: Functionally intact. No obvious neuro-muscular anomalies detected.  Muscle Tone/Strength: Functionally intact. No obvious neuro-muscular anomalies detected.  Sensory (Neurological): Unimpaired  Sensory (Neurological): Unimpaired  Palpation: No palpable anomalies  Palpation: No palpable anomalies   Assessment  Primary Diagnosis & Pertinent Problem List: The primary encounter diagnosis was Chronic low back pain (Primary Area of Pain) (Bilateral) (R>L). Diagnoses of Chronic back pain greater than 3 months duration, Failed back surgical syndrome, Chronic lower extremity pain (Secondary Area of Pain) (Bilateral) (R>L), Chronic lumbosacral radiculopathy (L5) (Left), Chronic knee pain (Left), Chronic neck pain (Tertiary Area of Pain) (Bilateral) (L>R), Muscle spasm of back, Disorder of skeletal system, Chronic pain syndrome, Presence of intrathecal pump, Pharmacologic therapy, Problems influencing health status, Long term prescription opiate use, Opiate use, Neurogenic pain, Neuropathic pain, and Chronic musculoskeletal pain were also pertinent to this visit.  Visit Diagnosis (New problems to examiner): 1. Chronic low back pain (Primary Area of Pain) (Bilateral) (R>L)   2. Chronic back pain greater than 3 months duration   3. Failed back surgical syndrome   4. Chronic lower extremity pain (Secondary Area of Pain) (Bilateral) (R>L)   5. Chronic lumbosacral radiculopathy (L5) (Left)   6. Chronic knee  pain (Left)   7. Chronic neck pain (Tertiary Area of Pain) (Bilateral) (L>R)   8. Muscle spasm of back   9. Disorder of skeletal system   10. Chronic pain syndrome   11. Presence of intrathecal pump   12. Pharmacologic therapy   13. Problems influencing health status   14. Long term prescription opiate use   15. Opiate use   16. Neurogenic pain   17. Neuropathic pain   18. Chronic musculoskeletal pain    Plan of Care (Initial workup plan)  Note: Because I know this patient and I have treated her condition before, today we will go a head and take over her care. She has signed today our medication agreement. We will be ordering the medication for her pump and we will be bringing her back for that refill.  Problem-specific plan: No problem-specific Assessment & Plan notes found for this encounter.   Lab Orders     Compliance Drug Analysis, Ur     Comp. Metabolic Panel (12)     C-reactive protein     Sedimentation rate     Magnesium     25-Hydroxyvitamin D Lcms D2+D3     Vitamin B12  Imaging Orders     DG Lumbar Spine Complete W/Bend Referral Orders  No referral(s) requested today   Procedure Orders    No procedure(s) ordered today   Pharmacotherapy (current): Medications ordered:  Meds ordered this encounter  Medications  . oxyCODONE (ROXICODONE) 15 MG immediate release tablet    Sig: Take 1 tablet (15 mg total) by mouth every 6 (six) hours as needed for pain.    Dispense:  120 tablet    Refill:  0    Fill one day early if pharmacy is closed on scheduled refill date. Do not fill until: 05/26/17 To last until: 06/25/17  . PAIN MANAGEMENT IT PUMP REFILL    Sig: 1 each by Intrathecal route once. Medication: PF Fentanyl 1,000.0 mcg/ml PF Bupivicaine 20.0 mg/ml  Total Volume: 40 ml Needed by 06-16-17 @ 1100    Dispense:  1 each    Refill:  0   Medications administered during this visit: Kendra Nelson had no medications administered during this visit.    Pharmacological management options:  Opioid Analgesics: The patient was informed that there is no guarantee that she would be a candidate for opioid analgesics. The decision will be  made following CDC guidelines. This decision will be based on the results of diagnostic studies, as well as Kendra Nelson's risk profile. For the time being, we will be taking over her medications but the patient has already been informed that we intend to slowly lower the dose of her narcotics. In addition, the patient was informed that we will be using interventional techniques to get this pain on the control so as to minimize the requirements of her other pain medications.   Membrane stabilizer: To be determined at a later time  Muscle relaxant: To be determined at a later time  NSAID: To be determined at a later time  Other analgesic(s): To be determined at a later time   Interventional management options: Kendra Nelson was informed that there is no guarantee that she would be a candidate for interventional therapies. The decision will be based on the results of diagnostic studies, as well as Kendra Nelson's risk profile.  Procedure(s) under consideration:  Intrathecal pump analysis without programming (today)  Intrathecal pump refill  Diagnostic caudal epidural steroid injection + diagnostic epidurogram  Possible Racz procedure  Diagnostic bilateral lumbar facet block  Possible bilateral lumbar facet RFA    Provider-requested follow-up: Return for Pump Refill (as per pump program).  Future Appointments Date Time Provider [place]  06/16/2017 11:45 AM Milinda Pointer, MD [facility] None    Primary Care Physician: Bernerd Limbo, MD Location: [facility] Note by: Gaspar Cola, MD Date: 05/26/2017; Time: 1:19 PM

## 2017-05-26 ENCOUNTER — Ambulatory Visit: Payer: Medicare Other | Attending: Pain Medicine | Admitting: Pain Medicine

## 2017-05-26 ENCOUNTER — Encounter: Payer: Self-pay | Admitting: Pain Medicine

## 2017-05-26 VITALS — BP 182/85 | HR 64 | Temp 98.0°F | Resp 16 | Ht 63.0 in | Wt 143.0 lb

## 2017-05-26 DIAGNOSIS — M5417 Radiculopathy, lumbosacral region: Secondary | ICD-10-CM | POA: Diagnosis not present

## 2017-05-26 DIAGNOSIS — G4700 Insomnia, unspecified: Secondary | ICD-10-CM | POA: Diagnosis not present

## 2017-05-26 DIAGNOSIS — G894 Chronic pain syndrome: Secondary | ICD-10-CM | POA: Insufficient documentation

## 2017-05-26 DIAGNOSIS — M5136 Other intervertebral disc degeneration, lumbar region: Secondary | ICD-10-CM | POA: Insufficient documentation

## 2017-05-26 DIAGNOSIS — M79604 Pain in right leg: Secondary | ICD-10-CM

## 2017-05-26 DIAGNOSIS — F329 Major depressive disorder, single episode, unspecified: Secondary | ICD-10-CM | POA: Insufficient documentation

## 2017-05-26 DIAGNOSIS — R918 Other nonspecific abnormal finding of lung field: Secondary | ICD-10-CM | POA: Insufficient documentation

## 2017-05-26 DIAGNOSIS — Z9689 Presence of other specified functional implants: Secondary | ICD-10-CM

## 2017-05-26 DIAGNOSIS — M961 Postlaminectomy syndrome, not elsewhere classified: Secondary | ICD-10-CM

## 2017-05-26 DIAGNOSIS — M899 Disorder of bone, unspecified: Secondary | ICD-10-CM

## 2017-05-26 DIAGNOSIS — Z981 Arthrodesis status: Secondary | ICD-10-CM | POA: Diagnosis not present

## 2017-05-26 DIAGNOSIS — Z79899 Other long term (current) drug therapy: Secondary | ICD-10-CM | POA: Diagnosis not present

## 2017-05-26 DIAGNOSIS — M545 Low back pain: Secondary | ICD-10-CM | POA: Insufficient documentation

## 2017-05-26 DIAGNOSIS — M549 Dorsalgia, unspecified: Secondary | ICD-10-CM

## 2017-05-26 DIAGNOSIS — M5442 Lumbago with sciatica, left side: Secondary | ICD-10-CM | POA: Diagnosis not present

## 2017-05-26 DIAGNOSIS — E785 Hyperlipidemia, unspecified: Secondary | ICD-10-CM | POA: Diagnosis not present

## 2017-05-26 DIAGNOSIS — E7800 Pure hypercholesterolemia, unspecified: Secondary | ICD-10-CM | POA: Diagnosis not present

## 2017-05-26 DIAGNOSIS — F1190 Opioid use, unspecified, uncomplicated: Secondary | ICD-10-CM

## 2017-05-26 DIAGNOSIS — E079 Disorder of thyroid, unspecified: Secondary | ICD-10-CM | POA: Insufficient documentation

## 2017-05-26 DIAGNOSIS — M62830 Muscle spasm of back: Secondary | ICD-10-CM | POA: Diagnosis not present

## 2017-05-26 DIAGNOSIS — M7918 Myalgia, other site: Secondary | ICD-10-CM | POA: Insufficient documentation

## 2017-05-26 DIAGNOSIS — Z7982 Long term (current) use of aspirin: Secondary | ICD-10-CM | POA: Insufficient documentation

## 2017-05-26 DIAGNOSIS — M791 Myalgia: Secondary | ICD-10-CM

## 2017-05-26 DIAGNOSIS — M1990 Unspecified osteoarthritis, unspecified site: Secondary | ICD-10-CM | POA: Insufficient documentation

## 2017-05-26 DIAGNOSIS — Z87442 Personal history of urinary calculi: Secondary | ICD-10-CM | POA: Insufficient documentation

## 2017-05-26 DIAGNOSIS — M79605 Pain in left leg: Secondary | ICD-10-CM | POA: Insufficient documentation

## 2017-05-26 DIAGNOSIS — M542 Cervicalgia: Secondary | ICD-10-CM

## 2017-05-26 DIAGNOSIS — M5441 Lumbago with sciatica, right side: Secondary | ICD-10-CM

## 2017-05-26 DIAGNOSIS — I129 Hypertensive chronic kidney disease with stage 1 through stage 4 chronic kidney disease, or unspecified chronic kidney disease: Secondary | ICD-10-CM | POA: Diagnosis not present

## 2017-05-26 DIAGNOSIS — N189 Chronic kidney disease, unspecified: Secondary | ICD-10-CM | POA: Diagnosis not present

## 2017-05-26 DIAGNOSIS — G8929 Other chronic pain: Secondary | ICD-10-CM | POA: Insufficient documentation

## 2017-05-26 DIAGNOSIS — M25562 Pain in left knee: Secondary | ICD-10-CM | POA: Diagnosis not present

## 2017-05-26 DIAGNOSIS — F419 Anxiety disorder, unspecified: Secondary | ICD-10-CM | POA: Diagnosis not present

## 2017-05-26 DIAGNOSIS — M48061 Spinal stenosis, lumbar region without neurogenic claudication: Secondary | ICD-10-CM | POA: Diagnosis not present

## 2017-05-26 DIAGNOSIS — Z789 Other specified health status: Secondary | ICD-10-CM | POA: Diagnosis not present

## 2017-05-26 DIAGNOSIS — Z978 Presence of other specified devices: Secondary | ICD-10-CM

## 2017-05-26 DIAGNOSIS — Z79891 Long term (current) use of opiate analgesic: Secondary | ICD-10-CM

## 2017-05-26 DIAGNOSIS — M792 Neuralgia and neuritis, unspecified: Secondary | ICD-10-CM

## 2017-05-26 MED ORDER — OXYCODONE HCL 15 MG PO TABS: 15.0000 mg | ORAL_TABLET | Freq: Four times a day (QID) | ORAL | 0 refills | Status: DC | PRN

## 2017-05-26 MED ORDER — PAIN MANAGEMENT IT PUMP REFILL: 1.0000 | Freq: Once | INTRATHECAL | 0 refills | Status: AC

## 2017-05-26 NOTE — Progress Notes (Signed)
Safety precautions to be maintained throughout the outpatient stay will include: orient to surroundings, keep bed in low position, maintain call bell within reach at all times, provide assistance with transfer out of bed and ambulation.  

## 2017-05-26 NOTE — Patient Instructions (Addendum)
____________________________________________________________________________________________  Appointment Policy Summary  It is our goal and responsibility to provide the medical community with assistance in the evaluation and management of patients with chronic pain. Unfortunately our resources are limited. Because we do not have an unlimited amount of time, or available appointments, we are required to closely monitor and manage their use. The following rules exist to maximize their use:  Patient's responsibilities: 1. Punctuality:  At what time should I arrive? You should be physically present in our office 30 minutes before your scheduled appointment. Your scheduled appointment is with your assigned healthcare provider. However, it takes 5-10 minutes to be "checked-in", and another 15 minutes for the nurses to do the admission. If you arrive to our office at the time you were given for your appointment, you will end up being at least 20-25 minutes late to your appointment with the provider. 2. Tardiness:  What happens if I arrive only a few minutes after my scheduled appointment time? You will need to reschedule your appointment. The cutoff is your appointment time. This is why it is so important that you arrive at least 30 minutes before that appointment. If you have an appointment scheduled for 10:00 AM and you arrive at 10:01, you will be required to reschedule your appointment.  3. Plan ahead:  Always assume that you will encounter traffic on your way in. Plan for it. If you are dependent on a driver, make sure they understand these rules and the need to arrive early. 4. Other appointments and responsibilities:  Avoid scheduling any other appointments before or after your pain clinic appointments.  5. Be prepared:  Write down everything that you need to discuss with your healthcare provider and give this information to the admitting nurse. Write down the medications that you will need  refilled. Bring your pills and bottles (even the empty ones), to all of your appointments, except for those where a procedure is scheduled. 6. No children or pets:  Find someone to take care of them. It is not appropriate to bring them in. 7. Scheduling changes:  We request "advanced notification" of any changes or cancellations. 8. Advanced notification:  Defined as a time period of more than 24 hours prior to the originally scheduled appointment. This allows for the appointment to be offered to other patients. 9. Rescheduling:  When a visit is rescheduled, it will require the cancellation of the original appointment. For this reason they both fall within the category of "Cancellations".  10. Cancellations:  They require advanced notification. Any cancellation less than 24 hours before the  appointment will be recorded as a "No Show". 11. No Show:  Defined as an unkept appointment where the patient failed to notify or declare to the practice their intention or inability to keep the appointment.  Corrective process for repeat offenders:  1. Tardiness: Three (3) episodes of rescheduling due to late arrivals will be recorded as one (1) "No Show". 2. Cancellation or reschedule: Three (3) cancellations or rescheduling will be recorded as one (1) "No Show". 3. "No Shows": Three (3) "No Shows" within a 12 month period will result in discharge from the practice.  ____________________________________________________________________________________________  ____________________________________________________________________________________________  Pain Scale  Introduction: The pain score used by this practice is the Verbal Numerical Rating Scale (VNRS-11). This is an 11-point scale. It is for adults and children 10 years or older. There are significant differences in how the pain score is reported, used, and applied. Forget everything you learned in the past and   learn this scoring  system.  General Information: The scale should reflect your current level of pain. Unless you are specifically asked for the level of your worst pain, or your average pain. If you are asked for one of these two, then it should be understood that it is over the past 24 hours.  Basic Activities of Daily Living (ADL): Personal hygiene, dressing, eating, transferring, and using restroom.  Instructions: Most patients tend to report their level of pain as a combination of two factors, their physical pain and their psychosocial pain. This last one is also known as "suffering" and it is reflection of how physical pain affects you socially and psychologically. From now on, report them separately. From this point on, when asked to report your pain level, report only your physical pain. Use the following table for reference.  Pain Clinic Pain Levels (0-5/10)  Pain Level Score  Description  No Pain 0   Mild pain 1 Nagging, annoying, but does not interfere with basic activities of daily living (ADL). Patients are able to eat, bathe, get dressed, toileting (being able to get on and off the toilet and perform personal hygiene functions), transfer (move in and out of bed or a chair without assistance), and maintain continence (able to control bladder and bowel functions). Blood pressure and heart rate are unaffected. A normal heart rate for a healthy adult ranges from 60 to 100 bpm (beats per minute).   Mild to moderate pain 2 Noticeable and distracting. Impossible to hide from other people. More frequent flare-ups. Still possible to adapt and function close to normal. It can be very annoying and may have occasional stronger flare-ups. With discipline, patients may get used to it and adapt.   Moderate pain 3 Interferes significantly with activities of daily living (ADL). It becomes difficult to feed, bathe, get dressed, get on and off the toilet or to perform personal hygiene functions. Difficult to get in and out of  bed or a chair without assistance. Very distracting. With effort, it can be ignored when deeply involved in activities.   Moderately severe pain 4 Impossible to ignore for more than a few minutes. With effort, patients may still be able to manage work or participate in some social activities. Very difficult to concentrate. Signs of autonomic nervous system discharge are evident: dilated pupils (mydriasis); mild sweating (diaphoresis); sleep interference. Heart rate becomes elevated (>115 bpm). Diastolic blood pressure (lower number) rises above 100 mmHg. Patients find relief in laying down and not moving.   Severe pain 5 Intense and extremely unpleasant. Associated with frowning face and frequent crying. Pain overwhelms the senses.  Ability to do any activity or maintain social relationships becomes significantly limited. Conversation becomes difficult. Pacing back and forth is common, as getting into a comfortable position is nearly impossible. Pain wakes you up from deep sleep. Physical signs will be obvious: pupillary dilation; increased sweating; goosebumps; brisk reflexes; cold, clammy hands and feet; nausea, vomiting or dry heaves; loss of appetite; significant sleep disturbance with inability to fall asleep or to remain asleep. When persistent, significant weight loss is observed due to the complete loss of appetite and sleep deprivation.  Blood pressure and heart rate becomes significantly elevated. Caution: If elevated blood pressure triggers a pounding headache associated with blurred vision, then the patient should immediately seek attention at an urgent or emergency care unit, as these may be signs of an impending stroke.    Emergency Department Pain Levels (6-10/10)  Emergency Room Pain 6   Severely limiting. Requires emergency care and should not be seen or managed at an outpatient pain management facility. Communication becomes difficult and requires great effort. Assistance to reach the  emergency department may be required. Facial flushing and profuse sweating along with potentially dangerous increases in heart rate and blood pressure will be evident.   Distressing pain 7 Self-care is very difficult. Assistance is required to transport, or use restroom. Assistance to reach the emergency department will be required. Tasks requiring coordination, such as bathing and getting dressed become very difficult.   Disabling pain 8 Self-care is no longer possible. At this level, pain is disabling. The individual is unable to do even the most "basic" activities such as walking, eating, bathing, dressing, transferring to a bed, or toileting. Fine motor skills are lost. It is difficult to think clearly.   Incapacitating pain 9 Pain becomes incapacitating. Thought processing is no longer possible. Difficult to remember your own name. Control of movement and coordination are lost.   The worst pain imaginable 10 At this level, most patients pass out from pain. When this level is reached, collapse of the autonomic nervous system occurs, leading to a sudden drop in blood pressure and heart rate. This in turn results in a temporary and dramatic drop in blood flow to the brain, leading to a loss of consciousness. Fainting is one of the body's self defense mechanisms. Passing out puts the brain in a calmed state and causes it to shut down for a while, in order to begin the healing process.    Summary: 1. Refer to this scale when providing us with your pain level. 2. Be accurate and careful when reporting your pain level. This will help with your care. 3. Over-reporting your pain level will lead to loss of credibility. 4. Even a level of 1/10 means that there is pain and will be treated at our facility. 5. High, inaccurate reporting will be documented as "Symptom Exaggeration", leading to loss of credibility and suspicions of possible secondary gains such as obtaining more narcotics, or wanting to appear  disabled, for fraudulent reasons. 6. Only pain levels of 5 or below will be seen at our facility. 7. Pain levels of 6 and above will be sent to the Emergency Department and the appointment cancelled. ____________________________________________________________________________________________  Pain Management Discharge Instructions  General Discharge Instructions :  If you need to reach your doctor call: Monday-Friday 8:00 am - 4:00 pm at [phone] or toll free [phone].  After clinic hours [phone] to have operator reach doctor.  Bring all of your medication bottles to all your appointments in the pain clinic.  To cancel or reschedule your appointment with Pain Management please remember to call 24 hours in advance to avoid a fee.  Refer to the educational materials which you have been given on: General Risks, I had my Procedure. Discharge Instructions, Post Sedation.  Post Procedure Instructions:  The drugs you were given will stay in your system until tomorrow, so for the next 24 hours you should not drive, make any legal decisions or drink any alcoholic beverages.  You may eat anything you prefer, but it is better to start with liquids then soups and crackers, and gradually work up to solid foods.  Please notify your doctor immediately if you have any unusual bleeding, trouble breathing or pain that is not related to your normal pain.  Depending on the type of procedure that was done, some parts of your body may feel week and/or numb.    This usually clears up by tonight or the next day.  Walk with the use of an assistive device or accompanied by an adult for the 24 hours.  You may use ice on the affected area for the first 24 hours.  Put ice in a Ziploc bag and cover with a towel and place against area 15 minutes on 15 minutes off.  You may switch to heat after 24 hours. 

## 2017-05-30 DIAGNOSIS — Z124 Encounter for screening for malignant neoplasm of cervix: Secondary | ICD-10-CM | POA: Insufficient documentation

## 2017-05-30 LAB — COMP. METABOLIC PANEL (12)
ALBUMIN: 4.3 g/dL (ref 3.5–5.5)
ALK PHOS: 108 IU/L (ref 39–117)
AST: 27 IU/L (ref 0–40)
Albumin/Globulin Ratio: 1.6 (ref 1.2–2.2)
BUN / CREAT RATIO: 19 (ref 9–23)
BUN: 13 mg/dL (ref 6–24)
Bilirubin Total: 0.2 mg/dL (ref 0.0–1.2)
Calcium: 9.7 mg/dL (ref 8.7–10.2)
Chloride: 100 mmol/L (ref 96–106)
Creatinine, Ser: 0.7 mg/dL (ref 0.57–1.00)
GFR, EST AFRICAN AMERICAN: 113 mL/min/{1.73_m2} (ref >59)
GFR, EST NON AFRICAN AMERICAN: 98 mL/min/{1.73_m2} (ref >59)
GLOBULIN, TOTAL: 2.7 g/dL (ref 1.5–4.5)
GLUCOSE: 96 mg/dL (ref 65–99)
Potassium: 4.4 mmol/L (ref 3.5–5.2)
SODIUM: 141 mmol/L (ref 134–144)
TOTAL PROTEIN: 7 g/dL (ref 6.0–8.5)

## 2017-05-30 LAB — 25-HYDROXYVITAMIN D LCMS D2+D3: 25-HYDROXY, VITAMIN D-3: 33 ng/mL

## 2017-05-30 LAB — VITAMIN B12: VITAMIN B 12: 617 pg/mL (ref 232–1245)

## 2017-05-30 LAB — SEDIMENTATION RATE: SED RATE: 26 mm/h (ref 0–40)

## 2017-05-30 LAB — 25-HYDROXY VITAMIN D LCMS D2+D3
25-Hydroxy, Vitamin D-2: <1 ng/mL
25-Hydroxy, Vitamin D: 33 ng/mL

## 2017-05-30 LAB — MAGNESIUM: Magnesium: 2.1 mg/dL (ref 1.6–2.3)

## 2017-05-30 LAB — C-REACTIVE PROTEIN: CRP: 9.2 mg/L — ABNORMAL HIGH (ref 0.0–4.9)

## 2017-05-31 DIAGNOSIS — T50905A Adverse effect of unspecified drugs, medicaments and biological substances, initial encounter: Secondary | ICD-10-CM

## 2017-05-31 DIAGNOSIS — L658 Other specified nonscarring hair loss: Secondary | ICD-10-CM | POA: Insufficient documentation

## 2017-06-01 LAB — COMPLIANCE DRUG ANALYSIS, UR

## 2017-06-15 NOTE — Progress Notes (Signed)
Patient's Name: Kendra Nelson  MRN: [id number]  Referring Provider: Bernerd Limbo, MD  DOB: October 23, 1960  PCP: Bernerd Limbo, MD  DOS: 06/16/2017  Note by: Gaspar Cola, MD  Service setting: Ambulatory outpatient  Specialty: Interventional Pain Management  Location: [facility] (AMB) Pain Management Facility    Patient type: Established   Primary Reason(s) for Visit: Encounter for evaluation before starting new chronic pain management plan of care (Level of risk: moderate) CC: Back Pain (lower bilateral)  HPI  Kendra Nelson is a 56 y.o. year old, female patient, who comes today for a follow-up evaluation to review the test results and decide on a treatment plan. She has Unspecified constipation; Abnormal weight gain; Adjustment disorder with anxiety; Agoraphobia with panic disorder; Benign essential hypertension; Chronic back pain greater than 3 months duration; Chronic knee pain (Left); Cigarette nicotine dependence without complication; Opioid-induced constipation (OIC); Chronic lumbosacral radiculopathy (L5) (Left); Muscle spasm of back; Need for influenza vaccination; Need for hepatitis C screening test; Palpitations; Primary insomnia; Psoriasis; Psoriasis of nail; Pure hypercholesterolemia; Pharmacologic therapy; Presence of intrathecal pump; Encounter for adjustment or management of infusion pump; Disorder of skeletal system; Chronic pain syndrome; Problems influencing health status; Failed back surgical syndrome; Chronic low back pain (Primary Area of Pain) (Bilateral) (R>L); Chronic lower extremity pain (Secondary Area of Pain) (Bilateral) (R>L); Long term prescription opiate use; Opiate use (915 MME/day); Neurogenic pain; Neuropathic pain; Chronic musculoskeletal pain; Drug-related hair loss; Screening for cervical cancer; and Lumbar facet syndrome (Bilateral) (R>L) on her problem list. Her primarily concern today is the Back Pain (lower bilateral)  Pain Assessment: Location: Lower, Left,  Right Back Radiating: hips and both legs to approx to the ankles or lower calf Onset: More than a month ago Duration: Chronic pain Quality: Aching, Burning, Other (Comment) (stinging) Severity: 5 /10 (self-reported pain score)  Note: Reported level is inconsistent with clinical observations. Clinically the patient looks like a 2/10 Information on the proper use of the pain scale provided to the patient today. When using our objective Pain Scale, levels between 6 and 10/10 are said to belong in an emergency room, as it progressively worsens from a 6/10, described as severely limiting, requiring emergency care not usually available at an outpatient pain management facility. At a 6/10 level, communication becomes difficult and requires great effort. Assistance to reach the emergency department may be required. Facial flushing and profuse sweating along with potentially dangerous increases in heart rate and blood pressure will be evident. Effect on ADL: walking for any period of time is different.  limited ROM and body dynamics Timing: Intermittent Modifying factors: intrathecal pain pump, medicines, changing positions  Kendra Nelson comes in today for a follow-up visit after her initial evaluation on 05/26/2017. Today we went over the results of her tests. These were explained in "Layman's terms". During today's appointment we went over my diagnostic impression, as well as the proposed treatment plan.  According to the patient the primary area of pain is that of the lower back with the right side being worst on the left. The patient has a positive history for prior back surgeries 8 with the first one having been around 1990 at [facility] in [place]. The last surgery was done around 1999 at [facility]. The patient also indicates having had nerve blocks done with the last one having been approximately 2-3 years ago. These were done by Dr. Ermalene Postin in [facility]. The patient currently has an  intrathecal pump with a 40  mL reservoir. According to the pump readout she currently has approximately 12.9 MLS of medicine in the pump. The pump is located on the left side of her abdomen and a currently has a combination of PF-Fentanyl 1000 g/mL + PF-Bupivacaine 20 mg/mL running in a simple continuous infusion at a rate of 357.6 g/day of fentanyl and 7.152 mg/day of bupivacaine. The pump currently has a low reservoir alarm set at 2.0 mL's and will need to be refilled before 06/25/2017. In addition, the patient last received a prescription for oral oxycodone 15 mg to be taken 1 tablet by mouth every 6 hours on 03/11/2017 and she had this refilled on 04/18/2017. The patient also indicates having had physical therapy for the back which consisted of 2 visits per week 24 weeks. She indicates that this was done while she was at [facility] and it did not help her pain. This was last done approximately 14 years ago. She denies any x-rays, MRIs, CT scans, or any type of imaging of her back in the last 2 years.  The patient's second area of pain is that of the lower extremities with the right being worse than the left. Today particular her left leg seems to hurt more than the right. However usually it is the right hurts more than the left. In terms of the right lower extremity pain, it travels all the way down to the ankle and it seems to follow a dermatomal distribution as it starts in the posterior aspect of the leg and it turns towards the anterior. She also indicates having some weakness in that right lower extremity. In the case of the left lower extremity the pain goes all the way down into the top of her foot in what seems to be an L5 dermatomal distribution. She also indicates having weakness and numbness with the weakness being worse than the numbness. She denies any surgeries in the leg Lumbar nerve blocks, physical therapy, but does indicate having had an x-ray of her left knee less than 2 years  ago.  The next area of pain is described to be her buttocks with the pain being bilateral but with the right side being worst on the left. She denies any surgeries, nerve blocks, physical therapy, or x-rays of that area.  The next area of pain is described to be that of the neck with the pain being in the posterior aspect of the neck and bilateral. The patient indicates that the pain is worse on the left side when compared to the right. She denies any shoulder pain, open back pain, or headaches. Also denies any prior surgeries, nerve blocks, physical therapy, or x-rays of the neck with the exceptions of 1 set idea to a couple years ago.  In considering the treatment plan options, Ms. Janes was reminded that I no longer take patients for medication management only. I asked her to let me know if she had no intention of taking advantage of the interventional therapies, so that we could make arrangements to provide this space to someone interested. I also made it clear that undergoing interventional therapies for the purpose of getting pain medications is very inappropriate on the part of a patient, and it will not be tolerated in this practice. This type of behavior would suggest true addiction and therefore it requires referral to an addiction specialist.   Further details on both, my assessment(s), as well as the proposed treatment plan, please see below.  Controlled Substance Pharmacotherapy Assessment REMS (Risk  Evaluation and Mitigation Strategy)  Analgesic: Oxycodone IR 15 mg every 6 hours (60 mg/day of oxycodone) (90 MME/day) (04/18/2017) + intrathecal pump medication (fentanyl) (825 MME/day) (915 MME/day) Highest recorded MME/day: (915 MME/day) MME/day: (915 MME/day) Pill Count: None expected due to no prior prescriptions written by our practice. No notes on file Pharmacokinetics: Liberation and absorption (onset of action): WNL Distribution (time to peak effect): WNL Metabolism and  excretion (duration of action): WNL         Pharmacodynamics: Desired effects: Analgesia: Ms. Hartshorn reports >50% benefit. Functional ability: Patient reports that medication allows her to accomplish basic ADLs Clinically meaningful improvement in function (CMIF): Sustained CMIF goals met Perceived effectiveness: Described as relatively effective, allowing for increase in activities of daily living (ADL) Undesirable effects: Side-effects or Adverse reactions: None reported Monitoring: [place] PMP: Online review of the past 30-monthperiod previously conducted. Not applicable at this point since we have not taken over the patient's medication management yet. List of all Serum Drug Screening Test(s):  No results found for: AMPHSCRSER, BARBSCRSER, BENZOSCRSER, COCAINSCRSER, PCPSCRSER, THCSCRSER, OPIATESCRSER, OXYSCRSER, [place] of [place] UDS test(s) done:  Lab Results  Component Value Date   SUMMARY FINAL 05/26/2017   Last UDS on record: Summary  Date Value Ref Range Status  05/26/2017 FINAL  Final    Comment:    ==================================================================== TOXASSURE COMP DRUG ANALYSIS,UR ==================================================================== Test                             Result       Flag       Units Drug Present and Declared for Prescription Verification   Oxycodone                      5404         EXPECTED   ng/mg creat   Oxymorphone                    5564         EXPECTED   ng/mg creat   Noroxycodone                   8076         EXPECTED   ng/mg creat   Noroxymorphone                 1398         EXPECTED   ng/mg creat    Sources of oxycodone are scheduled prescription medications.    Oxymorphone, noroxycodone, and noroxymorphone are expected    metabolites of oxycodone. Oxymorphone is also available as a    scheduled prescription medication.   Cyclobenzaprine                PRESENT      EXPECTED   Desmethylcyclobenzaprine        PRESENT      EXPECTED    Desmethylcyclobenzaprine is an expected metabolite of    cyclobenzaprine.   Propranolol                    PRESENT      EXPECTED Drug Present not Declared for Prescription Verification   Fentanyl                       6            UNEXPECTED ng/mg creat   Norfentanyl  39           UNEXPECTED ng/mg creat    Source of fentanyl is a scheduled prescription medication,    including IV, patch, and transmucosal formulations. Norfentanyl    is an expected metabolite of fentanyl. Drug Absent but Declared for Prescription Verification   Salicylate                     Not Detected UNEXPECTED    Aspirin, as indicated in the declared medication list, is not    always detected even when used as directed. ==================================================================== Test                      Result    Flag   Units      Ref Range   Creatinine              80               mg/dL      >=20 ==================================================================== Declared Medications:  The flagging and interpretation on this report are based on the  following declared medications.  Unexpected results may arise from  inaccuracies in the declared medications.  **Note: The testing scope of this panel includes these medications:  Cyclobenzaprine (Flexeril)  Oxycodone (Roxicodone)  Propranolol (Inderal)  **Note: The testing scope of this panel does not include small to  moderate amounts of these reported medications:  Aspirin (Aspirin 81)  **Note: The testing scope of this panel does not include following  reported medications:  Atorvastatin (Lipitor)  Lisinopril  Meloxicam  Multivitamin  Topical (Lidex) ==================================================================== For clinical consultation, please call [phone]. ====================================================================    UDS interpretation: No unexpected findings.           Medication Assessment Form: Patient introduced to form today Treatment compliance: Treatment may start today if patient agrees with proposed plan. Evaluation of compliance is not applicable at this point Risk Assessment Profile: Aberrant behavior: See initial evaluations. None observed or detected today Comorbid factors increasing risk of overdose: See initial evaluation. No additional risks detected today Medical Psychology Evaluation: Please see scanned results in medical record.     Opioid Risk Tool - 05/26/17 1113      Family History of Substance Abuse   Alcohol Negative   Illegal Drugs Positive Female   Rx Drugs Negative     Personal History of Substance Abuse   Alcohol Negative   Illegal Drugs Negative   Rx Drugs Negative     Age   Age between 68-45 years  No     History of Preadolescent Sexual Abuse   History of Preadolescent Sexual Abuse Negative or Female     Psychological Disease   Psychological Disease Negative   Depression Negative     Total Score   Opioid Risk Tool Scoring 2   Opioid Risk Interpretation Low Risk     ORT Scoring interpretation table:  Score <3 = Low Risk for SUD  Score between 4-7 = Moderate Risk for SUD  Score >8 = High Risk for Opioid Abuse   Risk Mitigation Strategies:  Patient opioid safety counseling: Completed today. Counseling provided to patient as per "Patient Counseling Document". Document signed by patient, attesting to counseling and understanding Patient-Prescriber Agreement (PPA): Obtained today.  Controlled substance notification to other providers: Written and sent today.  Pharmacologic Plan: Today we may be taking over the patient's pharmacological regimen. See below  Laboratory Chemistry  Inflammation Markers (CRP: Acute Phase) (ESR: Chronic Phase) Lab Results  Component Value Date   CRP 9.2 (H) 05/26/2017   ESRSEDRATE 26 05/26/2017                 Renal Function Markers Lab Results  Component Value  Date   BUN 13 05/26/2017   CREATININE 0.70 05/26/2017   GFRAA 113 05/26/2017   GFRNONAA 98 05/26/2017                 Hepatic Function Markers Lab Results  Component Value Date   AST 27 05/26/2017   ALT 52 (H) 10/05/2007   ALBUMIN 4.3 05/26/2017   ALKPHOS 108 05/26/2017   HCVAB NEG 10/13/2007                 Electrolytes Lab Results  Component Value Date   NA 141 05/26/2017   K 4.4 05/26/2017   CL 100 05/26/2017   CALCIUM 9.7 05/26/2017   MG 2.1 05/26/2017                 Neuropathy Markers Lab Results  Component Value Date   VITAMINB12 617 05/26/2017                 Bone Pathology Markers Lab Results  Component Value Date   ALKPHOS 108 05/26/2017   25OHVITD1 33 05/26/2017   25OHVITD2 <1.0 05/26/2017   25OHVITD3 33 05/26/2017   CALCIUM 9.7 05/26/2017                 Coagulation Parameters Lab Results  Component Value Date   PLT 255 06/09/2016                 Cardiovascular Markers Lab Results  Component Value Date   HGB 13.6 06/09/2016   HCT 41.1 06/09/2016                 Note: Lab results reviewed.  Recent Diagnostic Imaging Review  Cervical Imaging: Cervical DG complete:  Results for orders placed during the hospital encounter of 06/26/10  DG Cervical Spine Complete   Narrative Clinical Data: Fall.  Neck pain.  Shoulder pain.   CERVICAL SPINE - COMPLETE 4+ VIEW   Comparison: None.   Findings: Suboptimal examination due to inability to position the patient.  Odontoid is inadequately evaluated.  Multilevel cervical spondylosis is present extending from C3 through C6-C7.  There is reversal of the normal cervical lordosis centered around C4-C5. This is probably degenerative.  Prevertebral soft tissues appear within normal limits.  There is no cervical spine fracture identified.  On the oblique views, the odontoid is partially visualized.  The cervicothoracic junction appears normal.   IMPRESSION: 1.  Reversal of the normal cervical  lordosis may be positional or degenerative in nature. 2.  Mid cervical spondylosis. 3.  No cervical spine fracture or dislocation. 4.  Inadequate visualization of the odontoid.  If there is high clinical suspicion for cervical spine fracture, CT should be obtained based on limitations of the study.  Provider: Emeline General   Shoulder Imaging: Gaston Islam DG:  Results for orders placed during the hospital encounter of 06/26/10  DG Shoulder Right   Narrative Clinical Data: Neck.  Shoulder pain.  Trauma.  Fall.   RIGHT SHOULDER - 2+ VIEW   Comparison: None.   Findings: The [facility] joint appears preserved.  No fracture is identified.  The visualized right upper chest is unremarkable. Internal and external rotation views of the right  shoulder appear normal.  Shoulder is located.   IMPRESSION: No acute osseous abnormality.  Provider: Emeline General   Thoracic Imaging: Thoracic CT w/wo contrast:  Results for orders placed in visit on 09/21/13  CT T Spine Ltd Wo Or W/ Cm   Narrative * PRIOR REPORT IMPORTED FROM AN EXTERNAL SYSTEM *   CLINICAL DATA:  Bilateral lower extremity weakness.   EXAM:  CT THORACIC SPINE WITHOUT CONTRAST   TECHNIQUE:  Multidetector CT imaging of the thoracic spine was performed without  intravenous contrast administration. Multiplanar CT image  reconstructions were also generated.   COMPARISON:  None.   FINDINGS:  The sagittal reformatted images demonstrates normal overall  alignment of the thoracic vertebral bodies. There are moderate  degenerative changes with disc space narrowing, osteophytic spurring  and bony eburnation. I do not see any obvious bone lesions or  compression fracture. The facets are normally aligned. Mild facet  disease. The spinal canal is generous and there is no significant  spinal or foraminal stenosis. A spinal cord stimulator is noted at  the L1 level and there is a second catheter in the spinal canal  extending up to the  T6-7 level.   The visualized lungs are grossly clear. No worrisome lung lesions.  Small pulmonary nodules are noted. There are also borderline  enlarged mediastinal lymph nodes.   IMPRESSION:  Mild to moderate thoracic spine degenerative changes but No acute  compression fracture or destructive bone lesions.   Spinal cord stimulator and possible tens unit.   The spinal canal is generous and I do not see any findings for  spinal or significant foraminal stenosis.   Emphysematous changes are noted in the lungs. There are a few small  scattered pulmonary nodules which are likely benign but I would  recommend a followup dedicated chest CT for further evaluation.  Borderline mediastinal lymph nodes also.    Electronically Signed    By: Kalman Jewels M.D.    On: 09/21/2013 13:50       Lumbosacral Imaging: Lumbar CT wo contrast:  Results for orders placed in visit on 09/21/13  CT Lumbar Spine Wo Contrast   Narrative * PRIOR REPORT IMPORTED FROM AN EXTERNAL SYSTEM *   CLINICAL DATA:  Back pain and bilateral leg weakness. History of  multiple level lumbar fusion.   EXAM:  CT LUMBAR SPINE WITHOUT CONTRAST   TECHNIQUE:  Multidetector CT imaging of the lumbar spine was performed without  intravenous contrast administration. Multiplanar CT image  reconstructions were also generated.   COMPARISON:  None.   FINDINGS:  The sagittal reformatted images demonstrate a posterior and  interbody fusion changes from L3-S1 with pedicle screws, posterior  rods and interbody fusion cages. No complicating features associated  with the hardware. Solid interbody fusion changes are noted. There  are 2 spinal canal catheters. No complicating features.   Mild degenerative disc disease at L1-2 but no significant spinal or  foraminal stenosis.   L2-3: No focal disc protrusions, spinal or foraminal stenosis. Mild  bulging annulus.   Posterior and interbody fusion changes at L3-4, L4-5 and  L5-S1. No  obvious spinal or foraminal stenosis.   IMPRESSION:  Lumbar fusion from I5-W3 without complicating features. No spinal or  foraminal stenosis.   No acute bony findings.    Electronically Signed    By: Kalman Jewels M.D.    On: 09/21/2013 13:56       Knee Imaging: Knee-L DG 4 views:  Results for orders  placed during the hospital encounter of 04/12/10  DG Knee Complete 4 Views Left   Narrative Clinical Data: Left knee pain and swelling.  No known injury.   LEFT KNEE - COMPLETE 4+ VIEW   Comparison: None.   Findings: The mineralization and alignment are normal.  There is no evidence of acute fracture or dislocation.  There is a small knee joint effusion.  The medial soft tissues appear prominent.  The joint spaces appear preserved.   IMPRESSION: Small knee joint effusion.  No acute osseous findings.  Provider: Darryl Lent   Complexity Note: Imaging results reviewed. Results shared with Ms. Zoss, using Layman's terms.                         Meds   Current Outpatient Prescriptions:  .  acitretin (SORIATANE) 25 MG capsule, Take 1 capsule by mouth daily. , Disp: , Rfl:  .  aspirin EC 81 MG tablet, Take 81 mg by mouth., Disp: , Rfl:  .  atorvastatin (LIPITOR) 20 MG tablet, Take 20 mg by mouth daily. , Disp: , Rfl:  .  cyclobenzaprine (FLEXERIL) 10 MG tablet, , Disp: , Rfl:  .  lisinopril (PRINIVIL,ZESTRIL) 20 MG tablet, , Disp: , Rfl:  .  meloxicam (MOBIC) 15 MG tablet, Take 15 mg by mouth., Disp: , Rfl:  .  Multiple Vitamin (MULTIVITAMIN WITH MINERALS) TABS, Take 1 tablet by mouth daily., Disp: , Rfl:  .  [START ON 06/25/2017] oxyCODONE (OXY IR/ROXICODONE) 5 MG immediate release tablet, Take 1 tablet (5 mg total) by mouth 3 (three) times daily. Max: 3/day, Disp: 21 tablet, Rfl: 0 .  [START ON 07/02/2017] oxyCODONE (OXY IR/ROXICODONE) 5 MG immediate release tablet, Take 1 tablet (5 mg total) by mouth 2 (two) times daily. Max: 2/day, Disp: 14 tablet, Rfl:  0 .  [START ON 07/09/2017] oxyCODONE (OXY IR/ROXICODONE) 5 MG immediate release tablet, Take 1 tablet (5 mg total) by mouth daily. Max: 1/day, Disp: 7 tablet, Rfl: 0 .  [START ON 07/23/2017] oxyCODONE (OXY IR/ROXICODONE) 5 MG immediate release tablet, Take 2 tablets (10 mg total) by mouth every 6 (six) hours as needed for severe pain. Max: 8/day, Disp: 56 tablet, Rfl: 0 .  [START ON 07/30/2017] oxyCODONE (OXY IR/ROXICODONE) 5 MG immediate release tablet, Take 1-2 tablets (5-10 mg total) by mouth every 6 (six) hours as needed for severe pain. Max: 7/day, Disp: 49 tablet, Rfl: 0 .  [START ON 08/06/2017] oxyCODONE (OXY IR/ROXICODONE) 5 MG immediate release tablet, Take 1 tablet (5 mg total) by mouth 6 (six) times daily. Max: 6/day, Disp: 42 tablet, Rfl: 0 .  [START ON 08/13/2017] oxyCODONE (OXY IR/ROXICODONE) 5 MG immediate release tablet, Take 1 tablet (5 mg total) by mouth 5 (five) times daily. Max: 5/day, Disp: 35 tablet, Rfl: 0 .  [START ON 08/20/2017] oxyCODONE (OXY IR/ROXICODONE) 5 MG immediate release tablet, Take 1 tablet (5 mg total) by mouth 4 (four) times daily. Max: 4/day, Disp: 28 tablet, Rfl: 0 .  [START ON 08/27/2017] oxyCODONE (OXY IR/ROXICODONE) 5 MG immediate release tablet, Take 1 tablet (5 mg total) by mouth 3 (three) times daily. Max: 3/day, Disp: 21 tablet, Rfl: 0 .  [START ON 09/03/2017] oxyCODONE (OXY IR/ROXICODONE) 5 MG immediate release tablet, Take 1 tablet (5 mg total) by mouth 2 (two) times daily. Max: 2/day, Disp: 14 tablet, Rfl: 0 .  [START ON 09/10/2017] oxyCODONE (OXY IR/ROXICODONE) 5 MG immediate release tablet, Take 1 tablet (5 mg total) by  mouth daily. Max: 1/day, Disp: 7 tablet, Rfl: 0 .  oxyCODONE (ROXICODONE) 15 MG immediate release tablet, Take 1 tablet (15 mg total) by mouth every 6 (six) hours as needed for pain., Disp: 120 tablet, Rfl: 0 .  [START ON 06/25/2017] Oxycodone HCl 10 MG TABS, Take 1 tablet (10 mg total) by mouth every 6 (six) hours as needed., Disp: 120  tablet, Rfl: 0 .  propranolol (INDERAL) 10 MG tablet, 10 mg 2 (two) times daily. , Disp: , Rfl:   ROS  Constitutional: Denies any fever or chills Gastrointestinal: No reported hemesis, hematochezia, vomiting, or acute GI distress Musculoskeletal: Denies any acute onset joint swelling, redness, loss of ROM, or weakness Neurological: No reported episodes of acute onset apraxia, aphasia, dysarthria, agnosia, amnesia, paralysis, loss of coordination, or loss of consciousness  Allergies  Ms. Deroy is allergic to benadryl [diphenhydramine hcl] and bee venom.  [place]  Drug: Ms. Yamin  reports that she does not use drugs. Alcohol:  reports that she does not drink alcohol. Tobacco:  reports that she has been smoking.  She has a 25.00 pack-year smoking history. She has never used smokeless tobacco. Medical:  has a past medical history of Allergy; Anxiety; Arthritis; Complication of anesthesia; Headache(784.0); Hyperlipidemia; Neuromuscular disorder ([place]); and Skin abnormalities. Surgical: Ms. Ives  has a past surgical history that includes Spinal cord stimulator insertion; Back surgery; Pain pump implantation; Abdominal hysterectomy; Pain pump implantation (05/10/2012); Colonoscopy; and Spinal cord stimulator battery exchange (N/A, 06/09/2016). Family: family history includes Aortic aneurysm in her mother; Diabetes in her maternal grandmother.  Constitutional Exam  General appearance: Well nourished, well developed, and well hydrated. In no apparent acute distress Vitals:   06/16/17 1240  BP: (!) 166/96  Pulse: 73  Resp: 16  Temp: 97.8 F (36.6 C)  TempSrc: Axillary  SpO2: 100%  Weight: 140 lb (63.5 kg)  Height: 5' 3" (1.6 m)   BMI Assessment: Estimated body mass index is 24.8 kg/m as calculated from the following:   Height as of this encounter: 5' 3" (1.6 m).   Weight as of this encounter: 140 lb (63.5 kg).  BMI interpretation table: BMI level Category Range association with  higher incidence of chronic pain  <18 kg/m2 Underweight   18.5-24.9 kg/m2 Ideal body weight   25-29.9 kg/m2 Overweight Increased incidence by 20%  30-34.9 kg/m2 Obese (Class I) Increased incidence by 68%  35-39.9 kg/m2 Severe obesity (Class II) Increased incidence by 136%  >40 kg/m2 Extreme obesity (Class III) Increased incidence by 254%   BMI Readings from Last 4 Encounters:  06/16/17 24.80 kg/m  05/26/17 25.33 kg/m  06/09/16 24.80 kg/m  05/05/12 24.96 kg/m   Wt Readings from Last 4 Encounters:  06/16/17 140 lb (63.5 kg)  05/26/17 143 lb (64.9 kg)  06/09/16 140 lb (63.5 kg)  05/05/12 140 lb 14.4 oz (63.9 kg)  Psych/Mental status: Alert, oriented x 3 (person, place, & time)       Eyes: PERLA Respiratory: No evidence of acute respiratory distress  Cervical Spine Area Exam  Skin & Axial Inspection: No masses, redness, edema, swelling, or associated skin lesions Alignment: Symmetrical Functional ROM: Unrestricted ROM      Stability: No instability detected Muscle Tone/Strength: Functionally intact. No obvious neuro-muscular anomalies detected. Sensory (Neurological): Unimpaired Palpation: No palpable anomalies              Upper Extremity (UE) Exam    Side: Right upper extremity  Side: Left upper extremity  Skin & Extremity Inspection:  Skin color, temperature, and hair growth are WNL. No peripheral edema or cyanosis. No masses, redness, swelling, asymmetry, or associated skin lesions. No contractures.  Skin & Extremity Inspection: Skin color, temperature, and hair growth are WNL. No peripheral edema or cyanosis. No masses, redness, swelling, asymmetry, or associated skin lesions. No contractures.  Functional ROM: Unrestricted ROM          Functional ROM: Unrestricted ROM          Muscle Tone/Strength: Functionally intact. No obvious neuro-muscular anomalies detected.  Muscle Tone/Strength: Functionally intact. No obvious neuro-muscular anomalies detected.  Sensory  (Neurological): Unimpaired          Sensory (Neurological): Unimpaired          Palpation: No palpable anomalies              Palpation: No palpable anomalies              Specialized Test(s): Deferred         Specialized Test(s): Deferred          Thoracic Spine Area Exam  Skin & Axial Inspection: No masses, redness, or swelling Alignment: Symmetrical Functional ROM: Unrestricted ROM Stability: No instability detected Muscle Tone/Strength: Functionally intact. No obvious neuro-muscular anomalies detected. Sensory (Neurological): Unimpaired Muscle strength & Tone: No palpable anomalies  Lumbar Spine Area Exam  Skin & Axial Inspection: Well healed scar from previous spine surgery detected Alignment: Symmetrical Functional ROM: Decreased ROM      Stability: No instability detected Muscle Tone/Strength: Functionally intact. No obvious neuro-muscular anomalies detected. Sensory (Neurological): Movement-associated pain Palpation: Complains of area being tender to palpation       Provocative Tests: Lumbar Hyperextension and rotation test: Positive bilaterally for facet joint pain. Lumbar Lateral bending test: evaluation deferred today       Patrick's Maneuver: evaluation deferred today                    Gait & Posture Assessment  Ambulation: Unassisted Gait: Relatively normal for age and body habitus Posture: WNL   Lower Extremity Exam    Side: Right lower extremity  Side: Left lower extremity  Skin & Extremity Inspection: Skin color, temperature, and hair growth are WNL. No peripheral edema or cyanosis. No masses, redness, swelling, asymmetry, or associated skin lesions. No contractures.  Skin & Extremity Inspection: Skin color, temperature, and hair growth are WNL. No peripheral edema or cyanosis. No masses, redness, swelling, asymmetry, or associated skin lesions. No contractures.  Functional ROM: Unrestricted ROM          Functional ROM: Unrestricted ROM          Muscle  Tone/Strength: Functionally intact. No obvious neuro-muscular anomalies detected.  Muscle Tone/Strength: Functionally intact. No obvious neuro-muscular anomalies detected.  Sensory (Neurological): Unimpaired  Sensory (Neurological): Unimpaired  Palpation: No palpable anomalies  Palpation: No palpable anomalies   Assessment & Plan  Primary Diagnosis & Pertinent Problem List: The primary encounter diagnosis was Chronic low back pain (Primary Area of Pain) (Bilateral) (R>L). Diagnoses of Chronic lower extremity pain (Secondary Area of Pain) (Bilateral) (R>L), Chronic lumbosacral radiculopathy (L5) (Left), Failed back surgical syndrome, Chronic pain syndrome, Presence of intrathecal pump, Encounter for adjustment or management of infusion pump, Lumbar facet syndrome (Bilateral) (R>L), and Opiate use (915 MME/day) were also pertinent to this visit.  Visit Diagnosis: 1. Chronic low back pain (Primary Area of Pain) (Bilateral) (R>L)   2. Chronic lower extremity pain (Secondary Area of Pain) (Bilateral) (R>L)  3. Chronic lumbosacral radiculopathy (L5) (Left)   4. Failed back surgical syndrome   5. Chronic pain syndrome   6. Presence of intrathecal pump   7. Encounter for adjustment or management of infusion pump   8. Lumbar facet syndrome (Bilateral) (R>L)   9. Opiate use (915 MME/day)    Problems updated and reviewed during this visit: Problem  Drug-Related Hair Loss  Opiate use (915 MME/day)  Opioid-induced constipation (OIC)  Screening for Cervical Cancer   Overview:  Status post total hysterectomy    Procedure:  Intrathecal Drug Delivery System (IDDS):  Type: Reservoir Refill [phone]) No rate change Region: Abdominal Laterality: Left  Type of Pump: Medtronic Synchromed II (MRI-compatible) Delivery Route: Intrathecal Type of Pain Treated: Neuropathic/Nociceptive Primary Medication Class: Opioid/opiate  Medication, Concentration, Infusion Program, & Delivery Rate: Please see scanned  programming printout.   Indications: 1. Chronic low back pain (Primary Area of Pain) (Bilateral) (R>L)   2. Chronic lower extremity pain (Secondary Area of Pain) (Bilateral) (R>L)   3. Chronic lumbosacral radiculopathy (L5) (Left)   4. Failed back surgical syndrome   5. Chronic pain syndrome   6. Presence of intrathecal pump   7. Encounter for adjustment or management of infusion pump   8. Lumbar facet syndrome (Bilateral) (R>L)   9. Opiate use (915 MME/day)    Intrathecal Pump Therapy Assessment  Manufacturer: Medtronic Synchromed II Type: Programmable Volume: 40 mL reservoir MRI compatibility: Yes   Drug content:  Primary Medication Class: Opioid Primary Medication: PF-Fentanyl Secondary Medication: PF-Bupivacaine Other Medication: None   Programming:  Type: Simple continuous. See pump readout for details.   Changes:  Medication Change: None at this point Rate Change: No change in rate  Reported side-effects or adverse reactions: None reported  Effectiveness: Described as relatively effective, allowing for increase in activities of daily living (ADL) Clinically meaningful improvement in function (CMIF): Sustained CMIF goals met  Plan: Pump refill today  Pre-op Assessment:  Initial Vital Signs: Blood pressure (!) 166/96, pulse 73, temperature 97.8 F (36.6 C), temperature source Axillary, resp. rate 16, height 5' 3" (1.6 m), weight 140 lb (63.5 kg), SpO2 100 %. BMI: Estimated body mass index is 24.8 kg/m as calculated from the following:   Height as of this encounter: 5' 3" (1.6 m).   Weight as of this encounter: 140 lb (63.5 kg).  Risk Assessment: Allergies: Reviewed. She is allergic to benadryl [diphenhydramine hcl] and bee venom.  Allergy Precautions: None required Coagulopathies: Reviewed. None identified.  Blood-thinner therapy: None at this time Active Infection(s): Reviewed. None identified. Ms. Choudhry is afebrile  Site Confirmation: Ms. Kildow was  asked to confirm the procedure and laterality before marking the site Procedure checklist: Completed Consent: Before the procedure and under the influence of no sedative(s), amnesic(s), or anxiolytics, the patient was informed of the treatment options, risks and possible complications. To fulfill our ethical and legal obligations, as recommended by the American Medical Association's Code of Ethics, I have informed the patient of my clinical impression; the nature and purpose of the treatment or procedure; the risks, benefits, and possible complications of the intervention; the alternatives, including doing nothing; the risk(s) and benefit(s) of the alternative treatment(s) or procedure(s); and the risk(s) and benefit(s) of doing nothing.  Ms. Lieber was provided with information about the general risks and possible complications associated with most interventional procedures. These include, but are not limited to: failure to achieve desired goals, infection, bleeding, organ or nerve damage, allergic reactions, paralysis, and/or death.  In addition, she was informed of those risks and possible complications associated to this particular procedure, which include, but are not limited to: damage to the implant; failure to decrease pain; local, systemic, or serious CNS infections, intraspinal abscess with possible cord compression and paralysis, or life-threatening such as meningitis; bleeding; organ damage; nerve injury or damage with subsequent sensory, motor, and/or autonomic system dysfunction, resulting in transient or permanent pain, numbness, and/or weakness of one or several areas of the body; allergic reactions, either minor or major life-threatening, such as anaphylactic or anaphylactoid reactions.  Furthermore, Ms. Kosanke was informed of those risks and complications associated with the medications. These include, but are not limited to: allergic reactions (i.e.: anaphylactic or anaphylactoid  reactions); endorphine suppression; bradycardia and/or hypotension; water retention and/or peripheral vascular relaxation leading to lower extremity edema and possible stasis ulcers; respiratory depression and/or shortness of breath; decreased metabolic rate leading to weight gain; swelling or edema; medication-induced neural toxicity; particulate matter embolism and blood vessel occlusion with resultant organ, and/or nervous system infarction; and/or intrathecal granuloma formation with possible spinal cord compression and permanent paralysis.  Before refilling the pump Ms. Argote was informed that some of the medications used in the devise may not be FDA approved for such use and therefore it constitutes an off-label use of the medications.  Finally, she was informed that Medicine is not an exact science; therefore, there is also the possibility of unforeseen or unpredictable risks and/or possible complications that may result in a catastrophic outcome. The patient indicated having understood very clearly. We have given the patient no guarantees and we have made no promises. Enough time was given to the patient to ask questions, all of which were answered to the patient's satisfaction. Ms. Breece has indicated that she wanted to continue with the procedure. Attestation: I, the ordering provider, attest that I have discussed with the patient the benefits, risks, side-effects, alternatives, likelihood of achieving goals, and potential problems during recovery for the procedure that I have provided informed consent. Date: 06/16/2017; Time: 1:37 PM  Pre-Procedure Preparation:  Monitoring: As per clinic protocol. Respiration, ETCO2, SpO2, BP, heart rate and rhythm monitor placed and checked for adequate function Safety Precautions: Patient was assessed for positional comfort and pressure points before starting the procedure. Time-out: I initiated and conducted the "Time-out" before starting the  procedure, as per protocol. The patient was asked to participate by confirming the accuracy of the "Time Out" information. Verification of the correct person, site, and procedure were performed and confirmed by me, the nursing staff, and the patient. "Time-out" conducted as per Joint Commission's Universal Protocol (UP.01.01.01). "Time-out" Date & Time: 06/16/2017; 1333 hrs.  Description of Procedure Process:   Position: Supine Target Area: Central-port of intrathecal pump. Approach: Anterior, 90 degree angle approach. Area Prepped: Entire Area around the pump implant. Prepping solution: ChloraPrep (2% chlorhexidine gluconate and 70% isopropyl alcohol) Safety Precautions: Aspiration looking for blood return was conducted prior to all injections. At no point did we inject any substances, as a needle was being advanced. No attempts were made at seeking any paresthesias. Safe injection practices and needle disposal techniques used. Medications properly checked for expiration dates. SDV (single dose vial) medications used. Description of the Procedure: Protocol guidelines were followed. Two nurses trained to do implant refills were present during the entire procedure. The refill medication was checked by both healthcare providers as well as the patient. The patient was included in the "Time-out" to verify the medication. The patient was placed  in position. The pump was identified. The area was prepped in the usual manner. The sterile template was positioned over the pump, making sure the side-port location matched that of the pump. Both, the pump and the template were held for stability. The needle provided in the Medtronic Kit was then introduced thru the center of the template and into the central port. The pump content was aspirated and discarded volume documented. The new medication was slowly infused into the pump, thru the filter, making sure to avoid overpressure of the device. The needle was then  removed and the area cleansed, making sure to leave some of the prepping solution back to take advantage of its long term bactericidal properties. The pump was interrogated and programmed to reflect the correct medication, volume, and dosage. The program was printed and taken to the physician for approval. Once checked and signed by the physician, a copy was provided to the patient and another scanned into the EMR. Vitals:   06/16/17 1240  BP: (!) 166/96  Pulse: 73  Resp: 16  Temp: 97.8 F (36.6 C)  TempSrc: Axillary  SpO2: 100%  Weight: 140 lb (63.5 kg)  Height: 5' 3" (1.6 m)    Start Time: 1333 hrs. End Time: (P) 1353 hrs. Materials & Medications: Medtronic Refill Kit Medication(s): Please see chart orders for details.  Imaging Guidance:  Type of Imaging Technique: None used Indication(s): N/A Exposure Time: No patient exposure Contrast: None used. Fluoroscopic Guidance: N/A Ultrasound Guidance: N/A Interpretation: N/A  Antibiotic Prophylaxis:  Indication(s): None identified Antibiotic given: None  Post-operative Assessment:  EBL: None Complications: No immediate post-treatment complications observed by team, or reported by patient. Note: The patient tolerated the entire procedure well. A repeat set of vitals were taken after the procedure and the patient was kept under observation following institutional policy, for this type of procedure. Post-procedural neurological assessment was performed, showing return to baseline, prior to discharge. The patient was provided with post-procedure discharge instructions, including a section on how to identify potential problems. Should any problems arise concerning this procedure, the patient was given instructions to immediately contact us, at any time, without hesitation. In any case, we plan to contact the patient by telephone for a follow-up status report regarding this interventional procedure. Comments:  No additional relevant  information.  Plan of Care  Pharmacotherapy (Medications Ordered): Meds ordered this encounter  Medications  . Oxycodone HCl 10 MG TABS    Sig: Take 1 tablet (10 mg total) by mouth every 6 (six) hours as needed.    Dispense:  120 tablet    Refill:  0    Do not place this medication, or any other prescription from our practice, on "Automatic Refill". Patient may have prescription filled one day early if pharmacy is closed on scheduled refill date. Do not fill until: 06/25/17 To last until: 07/23/17  . oxyCODONE (OXY IR/ROXICODONE) 5 MG immediate release tablet    Sig: Take 1 tablet (5 mg total) by mouth 3 (three) times daily. Max: 3/day    Dispense:  21 tablet    Refill:  0    This prescription is part of a downward opioid taper. Fill instructions must be followed exactly as written to avoid withdrawal. Fill date: 06/25/17 To last until: 07/02/17  . oxyCODONE (OXY IR/ROXICODONE) 5 MG immediate release tablet    Sig: Take 1 tablet (5 mg total) by mouth 2 (two) times daily. Max: 2/day    Dispense:  14 tablet  Refill:  0    This prescription is part of a downward opioid taper. Fill instructions must be followed exactly as written to avoid withdrawal. Fill date: 07/02/17 To last until: 07/02/17  . oxyCODONE (OXY IR/ROXICODONE) 5 MG immediate release tablet    Sig: Take 1 tablet (5 mg total) by mouth daily. Max: 1/day    Dispense:  7 tablet    Refill:  0    This prescription is part of a downward opioid taper. Fill instructions must be followed exactly as written to avoid withdrawal. Fill date: 07/02/17 To last until: 07/16/17  . oxyCODONE (OXY IR/ROXICODONE) 5 MG immediate release tablet    Sig: Take 2 tablets (10 mg total) by mouth every 6 (six) hours as needed for severe pain. Max: 8/day    Dispense:  56 tablet    Refill:  0    This prescription is part of a downward opioid taper. Fill instructions must be followed exactly as written to avoid withdrawal. Fill date: 07/23/17 To last  until: 07/30/17  . oxyCODONE (OXY IR/ROXICODONE) 5 MG immediate release tablet    Sig: Take 1-2 tablets (5-10 mg total) by mouth every 6 (six) hours as needed for severe pain. Max: 7/day    Dispense:  49 tablet    Refill:  0    This prescription is part of a downward opioid taper. Fill instructions must be followed exactly as written to avoid withdrawal. Fill date: 07/30/17 To last until: 08/06/17  . oxyCODONE (OXY IR/ROXICODONE) 5 MG immediate release tablet    Sig: Take 1 tablet (5 mg total) by mouth 6 (six) times daily. Max: 6/day    Dispense:  42 tablet    Refill:  0    This prescription is part of a downward opioid taper. Fill instructions must be followed exactly as written to avoid withdrawal. Fill date: 08/06/17 To last until: 08/13/17  . oxyCODONE (OXY IR/ROXICODONE) 5 MG immediate release tablet    Sig: Take 1 tablet (5 mg total) by mouth 5 (five) times daily. Max: 5/day    Dispense:  35 tablet    Refill:  0    This prescription is part of a downward opioid taper. Fill instructions must be followed exactly as written to avoid withdrawal. Fill date: 08/13/17 To last until: 08/20/17  . oxyCODONE (OXY IR/ROXICODONE) 5 MG immediate release tablet    Sig: Take 1 tablet (5 mg total) by mouth 4 (four) times daily. Max: 4/day    Dispense:  28 tablet    Refill:  0    This prescription is part of a downward opioid taper. Fill instructions must be followed exactly as written to avoid withdrawal. Fill date: 08/20/17 To last until: 08/27/17  . oxyCODONE (OXY IR/ROXICODONE) 5 MG immediate release tablet    Sig: Take 1 tablet (5 mg total) by mouth 3 (three) times daily. Max: 3/day    Dispense:  21 tablet    Refill:  0    This prescription is part of a downward opioid taper. Fill instructions must be followed exactly as written to avoid withdrawal. Fill date: 08/27/17 To last until: 09/03/17  . oxyCODONE (OXY IR/ROXICODONE) 5 MG immediate release tablet    Sig: Take 1 tablet (5 mg total) by  mouth 2 (two) times daily. Max: 2/day    Dispense:  14 tablet    Refill:  0    This prescription is part of a downward opioid taper. Fill instructions must be followed exactly as written  to avoid withdrawal. Fill date: 09/03/17 To last until: 09/10/17  . oxyCODONE (OXY IR/ROXICODONE) 5 MG immediate release tablet    Sig: Take 1 tablet (5 mg total) by mouth daily. Max: 1/day    Dispense:  7 tablet    Refill:  0    This prescription is part of a downward opioid taper. Fill instructions must be followed exactly as written to avoid withdrawal. Fill date: 09/10/17 To last until: 09/17/17    Procedure Orders     PUMP REFILL     PUMP REFILL     LUMBAR FACET(MEDIAL BRANCH NERVE BLOCK) MBNB Lab Orders  No laboratory test(s) ordered today   Imaging Orders  No imaging studies ordered today   Referral Orders  No referral(s) requested today    Pharmacological management options:  Opioid Analgesics: We'll take over management today. See above orders Membrane stabilizer: We have discussed the possibility of optimizing this mode of therapy, if tolerated Muscle relaxant: We have discussed the possibility of a trial NSAID: We have discussed the possibility of a trial Other analgesic(s): To be determined at a later time   Interventional management options: Planned, scheduled, and/or pending:    Intrathecal pump refill    Considering:   Intrathecal pump analysis without programming  Intrathecal pump refill  Diagnostic caudal epidural steroid injection + diagnostic epidurogram  Possible Racz procedure  Diagnostic bilateral lumbar facet block  Possible bilateral lumbar facet RFA    PRN Procedures:   None at this time   Medications administered: Ms. Langi had no medications administered during this visit.  See the medical record for exact dosing, route, and time of administration. Disposition: Discharge home  Discharge Date & Time: 06/16/2017; 1415 hrs.   Physician-requested  Follow-up: Return for Pump Refill (as per pump program), in addition, PRN Procedure. Future Appointments Date Time Provider [place]  09/16/2017 11:00 AM Milinda Pointer, MD [facility] None   Primary Care Physician: Bernerd Limbo, MD Location: [facility] Note by: Gaspar Cola, MD Date: 06/16/2017; Time: 2:08 PM  Disclaimer:  Medicine is not an Chief Strategy Officer. The only guarantee in medicine is that nothing is guaranteed. It is important to note that the decision to proceed with this intervention was based on the information collected from the patient. The Data and conclusions were drawn from the patient's questionnaire, the interview, and the physical examination. Because the information was provided in large part by the patient, it cannot be guaranteed that it has not been purposely or unconsciously manipulated. Every effort has been made to obtain as much relevant data as possible for this evaluation. It is important to note that the conclusions that lead to this procedure are derived in large part from the available data. Always take into account that the treatment will also be dependent on availability of resources and existing treatment guidelines, considered by other Pain Management Practitioners as being common knowledge and practice, at the time of the intervention. For Medico-Legal purposes, it is also important to point out that variation in procedural techniques and pharmacological choices are the acceptable norm. The indications, contraindications, technique, and results of the above procedure should only be interpreted and judged by a Board-Certified Interventional Pain Specialist with extensive familiarity and expertise in the same exact procedure and technique.

## 2017-06-16 ENCOUNTER — Ambulatory Visit: Payer: Medicare Other | Attending: Pain Medicine | Admitting: Pain Medicine

## 2017-06-16 ENCOUNTER — Encounter: Payer: Self-pay | Admitting: Pain Medicine

## 2017-06-16 VITALS — BP 166/96 | HR 73 | Temp 97.8°F | Resp 16 | Ht 63.0 in | Wt 140.0 lb

## 2017-06-16 DIAGNOSIS — M5441 Lumbago with sciatica, right side: Secondary | ICD-10-CM | POA: Diagnosis not present

## 2017-06-16 DIAGNOSIS — E7800 Pure hypercholesterolemia, unspecified: Secondary | ICD-10-CM | POA: Diagnosis not present

## 2017-06-16 DIAGNOSIS — M79604 Pain in right leg: Secondary | ICD-10-CM | POA: Insufficient documentation

## 2017-06-16 DIAGNOSIS — M47816 Spondylosis without myelopathy or radiculopathy, lumbar region: Secondary | ICD-10-CM | POA: Insufficient documentation

## 2017-06-16 DIAGNOSIS — F5101 Primary insomnia: Secondary | ICD-10-CM | POA: Insufficient documentation

## 2017-06-16 DIAGNOSIS — R635 Abnormal weight gain: Secondary | ICD-10-CM | POA: Insufficient documentation

## 2017-06-16 DIAGNOSIS — Z978 Presence of other specified devices: Secondary | ICD-10-CM

## 2017-06-16 DIAGNOSIS — M5442 Lumbago with sciatica, left side: Secondary | ICD-10-CM

## 2017-06-16 DIAGNOSIS — I10 Essential (primary) hypertension: Secondary | ICD-10-CM | POA: Diagnosis not present

## 2017-06-16 DIAGNOSIS — Z7982 Long term (current) use of aspirin: Secondary | ICD-10-CM | POA: Insufficient documentation

## 2017-06-16 DIAGNOSIS — Z451 Encounter for adjustment and management of infusion pump: Secondary | ICD-10-CM

## 2017-06-16 DIAGNOSIS — M62830 Muscle spasm of back: Secondary | ICD-10-CM | POA: Insufficient documentation

## 2017-06-16 DIAGNOSIS — G8929 Other chronic pain: Secondary | ICD-10-CM | POA: Diagnosis not present

## 2017-06-16 DIAGNOSIS — Z9689 Presence of other specified functional implants: Secondary | ICD-10-CM | POA: Insufficient documentation

## 2017-06-16 DIAGNOSIS — F11920 Opioid use, unspecified with intoxication, uncomplicated: Secondary | ICD-10-CM | POA: Diagnosis not present

## 2017-06-16 DIAGNOSIS — F4322 Adjustment disorder with anxiety: Secondary | ICD-10-CM | POA: Insufficient documentation

## 2017-06-16 DIAGNOSIS — M79605 Pain in left leg: Secondary | ICD-10-CM | POA: Insufficient documentation

## 2017-06-16 DIAGNOSIS — G894 Chronic pain syndrome: Secondary | ICD-10-CM | POA: Insufficient documentation

## 2017-06-16 DIAGNOSIS — M5417 Radiculopathy, lumbosacral region: Secondary | ICD-10-CM | POA: Insufficient documentation

## 2017-06-16 DIAGNOSIS — F4001 Agoraphobia with panic disorder: Secondary | ICD-10-CM | POA: Diagnosis not present

## 2017-06-16 DIAGNOSIS — F1190 Opioid use, unspecified, uncomplicated: Secondary | ICD-10-CM

## 2017-06-16 DIAGNOSIS — K5900 Constipation, unspecified: Secondary | ICD-10-CM | POA: Insufficient documentation

## 2017-06-16 DIAGNOSIS — F17210 Nicotine dependence, cigarettes, uncomplicated: Secondary | ICD-10-CM | POA: Diagnosis not present

## 2017-06-16 DIAGNOSIS — M545 Low back pain: Secondary | ICD-10-CM | POA: Diagnosis present

## 2017-06-16 DIAGNOSIS — M25562 Pain in left knee: Secondary | ICD-10-CM | POA: Insufficient documentation

## 2017-06-16 DIAGNOSIS — L409 Psoriasis, unspecified: Secondary | ICD-10-CM | POA: Diagnosis not present

## 2017-06-16 DIAGNOSIS — M961 Postlaminectomy syndrome, not elsewhere classified: Secondary | ICD-10-CM | POA: Diagnosis not present

## 2017-06-16 DIAGNOSIS — F329 Major depressive disorder, single episode, unspecified: Secondary | ICD-10-CM | POA: Insufficient documentation

## 2017-06-16 DIAGNOSIS — Z79899 Other long term (current) drug therapy: Secondary | ICD-10-CM | POA: Insufficient documentation

## 2017-06-16 MED ORDER — OXYCODONE HCL 5 MG PO TABS: 10.0000 mg | ORAL_TABLET | Freq: Four times a day (QID) | ORAL | 0 refills | Status: DC | PRN

## 2017-06-16 MED ORDER — OXYCODONE HCL 5 MG PO TABS: 5.0000 mg | ORAL_TABLET | Freq: Every day | ORAL | 0 refills | Status: DC

## 2017-06-16 MED ORDER — OXYCODONE HCL 5 MG PO TABS: 5.0000 mg | ORAL_TABLET | Freq: Two times a day (BID) | ORAL | 0 refills | Status: DC

## 2017-06-16 MED ORDER — OXYCODONE HCL 5 MG PO TABS: 5.0000 mg | ORAL_TABLET | Freq: Three times a day (TID) | ORAL | 0 refills | Status: DC

## 2017-06-16 MED ORDER — OXYCODONE HCL 5 MG PO TABS: 5.0000 mg | ORAL_TABLET | Freq: Four times a day (QID) | ORAL | 0 refills | Status: DC

## 2017-06-16 MED ORDER — OXYCODONE HCL 5 MG PO TABS: 5.0000 mg | ORAL_TABLET | Freq: Four times a day (QID) | ORAL | 0 refills | Status: DC | PRN

## 2017-06-16 MED ORDER — OXYCODONE HCL 10 MG PO TABS: 10.0000 mg | ORAL_TABLET | Freq: Four times a day (QID) | ORAL | 0 refills | Status: DC | PRN

## 2017-06-16 NOTE — Patient Instructions (Addendum)
Opioid Overdose Opioids are substances that relieve pain by binding to pain receptors in your brain and spinal cord. Opioids include illegal drugs, such as heroin, as well as prescription pain medicines.An opioid overdose happens when you take too much of an opioid substance. This can happen with any type of opioid, including:  Heroin.  Morphine.  Codeine.  Methadone.  Oxycodone.  Hydrocodone.  Fentanyl.  Hydromorphone.  Buprenorphine.  The effects of an overdose can be mild, dangerous, or even deadly. Opioid overdose is a medical emergency. What are the causes? This condition may be caused by:  Taking too much of an opioid by accident.  Taking too much of an opioid on purpose.  An error made by a health care provider who prescribes a medicine.  An error made by the pharmacist who fills the prescription order.  Using more than one substance that contains opioids at the same time.  Mixing an opioid with a substance that affects your heart, breathing, or blood pressure. These include alcohol, tranquilizers, sleeping pills, illegal drugs, and some over-the-counter medicines.  What increases the risk? This condition is more likely in:  Children. They may be attracted to colorful pills. Because of a child's small size, even a small amount of a drug can be dangerous.  Elderly people. They may be taking many different drugs. Elderly people may have difficulty reading labels or remembering when they last took their medicine.  People who take an opioid on a long-term basis.  People who use: ? Illegal drugs. ? Other substances, including alcohol, while using an opioid.  People who have: ? A history of drug or alcohol abuse. ? Certain mental health conditions.  People who take opioids that are not prescribed for them.  What are the signs or symptoms? Symptoms of this condition depend on the type of opioid and the amount that was taken. Common symptoms  include:  Sleepiness or difficulty waking from sleep.  Confusion.  Slurred speech.  Slowed breathing and a slow pulse.  Nausea and vomiting.  Abnormally small pupils.  Signs and symptoms that require emergency treatment include:  Cold, clammy, and pale skin.  Blue lips and fingernails.  Vomiting.  Gurgling sounds in the throat.  A pulse that is very slow or difficult to detect.  Breathing that is very slow, noisy, or difficult to detect.  Limp body.  Inability to respond to speech or be awakened from sleep (stupor).  How is this diagnosed? This condition is diagnosed based on your symptoms. It is important to tell your health care provider:  All of the opioidsthat you took.  When you took the opioids.  Whether you were drinking alcohol or using other substances.  Your health care provider will do a physical exam. This exam may include:  Checking and monitoring your heart rate and rhythm, your breathing rate and depth, your temperature, and your blood pressure (vital signs).  Checking for abnormally small pupils.  Measuring oxygen levels in your blood.  You may also have blood tests or urine tests. How is this treated? Supporting your vital signs and your breathing is the first step in treating an opioid overdose. Treatment may also include:  Giving fluids and minerals (electrolytes) through an IV tube.  Inserting a breathing tube (endotracheal tube) in your airway to help you breathe.  Giving oxygen.  Passing a tube through your nose and into your stomach (NG tube, or nasogastric tube) to wash out your stomach.  Giving medicines that: ? Increase your   blood pressure. ? Absorb any opioid that is in your digestive system. ? Reverse the effects of the opioid (naloxone).  Ongoing counseling and mental health support if you intentionally overdosed or used an illegal drug.  Follow these instructions at home:  Take over-the-counter and prescription  medicines only as told by your health care provider. Always ask your health care provider about possible side effects and interactions of any new medicine that you start taking.  Keep a list of all of the medicines that you take, including over-the-counter medicines. Bring this list with you to all of your medical visits.  Drink enough fluid to keep your urine clear or pale yellow.  Keep all follow-up visits as told by your health care provider. This is important. How is this prevented?  Get help if you are struggling with: ? Alcohol or drug use. ? Depression or another mental health problem.  Keep the phone number of your local poison control center near your phone or on your cell phone.  Store all medicines in safety containers that are out of the reach of children.  Read the drug inserts that come with your medicines.  Do not drink alcohol when taking opioids.  Do not use illegal drugs.  Do not take opioid medicines that are not prescribed for you. Contact a health care provider if:  Your symptoms return.  You develop new symptoms or side effects when you are taking medicines. Get help right away if:  You think that you or someone else may have taken too much of an opioid. The hotline of the [facility] is (407)037-0437.  You or someone else is having symptoms of an opioid overdose.  You have serious thoughts about hurting yourself or others.  You have: ? Chest pain. ? Difficulty breathing. ? A loss of consciousness. Opioid overdose is an emergency. Do not wait to see if the symptoms will go away. Get medical help right away. Call your local emergency services (911 in the [place] Do not drive yourself to the hospital. This information is not intended to replace advice given to you by your health care provider. Make sure you discuss any questions you have with your health care provider. Document Released: 09/24/2004 Document Revised: 01/23/2016 Document  Reviewed: 01/31/2015 Elsevier Interactive Patient Education  2018 [employer].  ____________________________________________________________________________________________  Preparing for Procedure with Sedation Instructions: . Oral Intake: Do not eat or drink anything for at least 8 hours prior to your procedure. . Transportation: Public transportation is not allowed. Bring an adult driver. The driver must be physically present in our waiting room before any procedure can be started. [facility] Physical Assistance: Bring an adult physically capable of assisting you, in the event you need help. This adult should keep you company at home for at least 6 hours after the procedure. . Blood Pressure Medicine: Take your blood pressure medicine with a sip of water the morning of the procedure. . Blood thinners:  . Diabetics on insulin: Notify the staff so that you can be scheduled 1st case in the morning. If your diabetes requires high dose insulin, take only  of your normal insulin dose the morning of the procedure and notify the staff that you have done so. . Preventing infections: Shower with an antibacterial soap the morning of your procedure. . Build-up your immune system: Take 1000 mg of Vitamin C with every meal (3 times a day) the day prior to your procedure. Marland Kitchen Antibiotics: Inform the staff if you  have a condition or reason that requires you to take antibiotics before dental procedures. . Pregnancy: If you are pregnant, call and cancel the procedure. . Sickness: If you have a cold, fever, or any active infections, call and cancel the procedure. . Arrival: You must be in the facility at least 30 minutes prior to your scheduled procedure. . Children: Do not bring children with you. . Dress appropriately: Bring dark clothing that you would not mind if they get stained. . Valuables: Do not bring any jewelry or valuables. Procedure appointments are reserved for interventional treatments only. Marland Kitchen No  Prescription Refills. . No medication changes will be discussed during procedure appointments. . No disability issues will be discussed. ____________________________________________________________________________________________

## 2017-06-17 ENCOUNTER — Telehealth: Payer: Self-pay | Admitting: *Deleted

## 2017-06-17 NOTE — Telephone Encounter (Signed)
Attempted to call patient post pump refill. Message left.

## 2017-06-23 MED FILL — Medication: INTRATHECAL | Qty: 1 | Status: AC

## 2017-07-11 DIAGNOSIS — G2581 Restless legs syndrome: Secondary | ICD-10-CM | POA: Insufficient documentation

## 2017-08-05 ENCOUNTER — Telehealth: Payer: Self-pay

## 2017-08-05 NOTE — Telephone Encounter (Signed)
Prescriptions verified and clarified with pharmacist.

## 2017-08-05 NOTE — Telephone Encounter (Signed)
Pharmacy called and said they have a question regarding the oxycodone taper down script. Please call as soon as you can.

## 2017-09-02 ENCOUNTER — Other Ambulatory Visit: Payer: Self-pay

## 2017-09-02 MED ORDER — PAIN MANAGEMENT IT PUMP REFILL: 1.0000 | Freq: Once | INTRATHECAL | 0 refills | Status: AC

## 2017-09-16 ENCOUNTER — Encounter: Payer: Self-pay | Admitting: Pain Medicine

## 2017-09-16 ENCOUNTER — Ambulatory Visit: Payer: Medicare Other | Attending: Pain Medicine | Admitting: Pain Medicine

## 2017-09-16 VITALS — BP 184/91 | HR 74 | Temp 98.2°F | Resp 16 | Ht 63.0 in | Wt 140.0 lb

## 2017-09-16 DIAGNOSIS — M79605 Pain in left leg: Secondary | ICD-10-CM | POA: Diagnosis not present

## 2017-09-16 DIAGNOSIS — M961 Postlaminectomy syndrome, not elsewhere classified: Secondary | ICD-10-CM

## 2017-09-16 DIAGNOSIS — M79604 Pain in right leg: Secondary | ICD-10-CM | POA: Diagnosis not present

## 2017-09-16 DIAGNOSIS — G8929 Other chronic pain: Secondary | ICD-10-CM

## 2017-09-16 DIAGNOSIS — G894 Chronic pain syndrome: Secondary | ICD-10-CM

## 2017-09-16 DIAGNOSIS — M5441 Lumbago with sciatica, right side: Secondary | ICD-10-CM

## 2017-09-16 DIAGNOSIS — M5442 Lumbago with sciatica, left side: Secondary | ICD-10-CM

## 2017-09-16 DIAGNOSIS — M545 Low back pain: Secondary | ICD-10-CM | POA: Diagnosis present

## 2017-09-16 DIAGNOSIS — Z451 Encounter for adjustment and management of infusion pump: Secondary | ICD-10-CM

## 2017-09-16 DIAGNOSIS — Z978 Presence of other specified devices: Secondary | ICD-10-CM

## 2017-09-16 NOTE — Progress Notes (Signed)
Patient's Name: Kendra Nelson  MRN: [id number]  Referring Provider: Bernerd Limbo, MD  DOB: March 02, 1961  PCP: Bernerd Limbo, MD  DOS: 09/16/2017  Note by: Gaspar Cola, MD  Service setting: Ambulatory outpatient  Specialty: Interventional Pain Management  Patient type: Established  Location: [facility] (AMB) Pain Management Facility  Visit type: Interventional Procedure   Primary Reason for Visit: Interventional Pain Management Treatment. CC: Back Pain (lower)  Procedure:  Intrathecal Drug Delivery System (IDDS):  Type: Reservoir Refill [phone]) No rate change Region: Abdominal Laterality: Left  Type of Pump: Medtronic Synchromed II (MRI-compatible) Delivery Route: Intrathecal Type of Pain Treated: Neuropathic/Nociceptive Primary Medication Class: Opioid/opiate  Medication, Concentration, Infusion Program, & Delivery Rate: Please see scanned programming printout.   Indications: 1. Chronic pain syndrome   2. Chronic low back pain (Primary Area of Pain) (Bilateral) (R>L)   3. Chronic lower extremity pain (Secondary Area of Pain) (Bilateral) (R>L)   4. Failed back surgical syndrome   5. Adjustment and management of infusion pump   6. Presence of intrathecal pump    Pain Assessment: Self-Reported Pain Score: 8 /10             Reported level is compatible with observation.        Intrathecal Pump Therapy Assessment  Manufacturer: Medtronic Synchromed II Type: Programmable Volume: 40 mL reservoir MRI compatibility: Yes   Drug content:  Primary Medication Class: Opioid Primary Medication: PF-Fentanyl Secondary Medication: PF-Bupivacaine Other Medication: None   Programming:  Type: Simple continuous. See pump readout for details.   Changes:  Medication Change: None at this point Rate Change: No change in rate  Reported side-effects or adverse reactions: None reported  Effectiveness: Described as relatively effective, allowing for increase in activities of daily living  (ADL) Clinically meaningful improvement in function (CMIF): Sustained CMIF goals met  Plan: Pump refill today  Pre-op Assessment:  Kendra Nelson is a 57 y.o. (year old), female patient, seen today for interventional treatment. She  has a past surgical history that includes Spinal cord stimulator insertion; Back surgery; Pain pump implantation; Abdominal hysterectomy; Pain pump implantation (05/10/2012); Colonoscopy; and Spinal cord stimulator battery exchange (N/A, 06/09/2016). Kendra Nelson has a current medication list which includes the following prescription(s): acitretin, aspirin ec, atorvastatin, cyclobenzaprine, lisinopril, meloxicam, oxycodone, propranolol, multivitamin with minerals, oxycodone, oxycodone, oxycodone, oxycodone, oxycodone, oxycodone, oxycodone, oxycodone, oxycodone, oxycodone, oxycodone, and oxycodone hcl. Her primarily concern today is the Back Pain (lower)  Initial Vital Signs: There were no vitals taken for this visit. BMI: Estimated body mass index is 24.8 kg/m as calculated from the following:   Height as of this encounter: '5\' 3"'  (1.6 m).   Weight as of this encounter: 140 lb (63.5 kg).  Risk Assessment: Allergies: Reviewed. She is allergic to benadryl [diphenhydramine hcl] and bee venom.  Allergy Precautions: None required Coagulopathies: Reviewed. None identified.  Blood-thinner therapy: None at this time Active Infection(s): Reviewed. None identified. Kendra Nelson is afebrile  Site Confirmation: Kendra Nelson was asked to confirm the procedure and laterality before marking the site Procedure checklist: Completed Consent: Before the procedure and under the influence of no sedative(s), amnesic(s), or anxiolytics, the patient was informed of the treatment options, risks and possible complications. To fulfill our ethical and legal obligations, as recommended by the American Medical Association's Code of Ethics, I have informed the patient of my clinical impression;  the nature and purpose of the treatment or procedure; the risks, benefits, and possible complications of the intervention; the alternatives, including doing  nothing; the risk(s) and benefit(s) of the alternative treatment(s) or procedure(s); and the risk(s) and benefit(s) of doing nothing.  Kendra Nelson was provided with information about the general risks and possible complications associated with most interventional procedures. These include, but are not limited to: failure to achieve desired goals, infection, bleeding, organ or nerve damage, allergic reactions, paralysis, and/or death.  In addition, she was informed of those risks and possible complications associated to this particular procedure, which include, but are not limited to: damage to the implant; failure to decrease pain; local, systemic, or serious CNS infections, intraspinal abscess with possible cord compression and paralysis, or life-threatening such as meningitis; bleeding; organ damage; nerve injury or damage with subsequent sensory, motor, and/or autonomic system dysfunction, resulting in transient or permanent pain, numbness, and/or weakness of one or several areas of the body; allergic reactions, either minor or major life-threatening, such as anaphylactic or anaphylactoid reactions.  Furthermore, Kendra Nelson was informed of those risks and complications associated with the medications. These include, but are not limited to: allergic reactions (i.e.: anaphylactic or anaphylactoid reactions); endorphine suppression; bradycardia and/or hypotension; water retention and/or peripheral vascular relaxation leading to lower extremity edema and possible stasis ulcers; respiratory depression and/or shortness of breath; decreased metabolic rate leading to weight gain; swelling or edema; medication-induced neural toxicity; particulate matter embolism and blood vessel occlusion with resultant organ, and/or nervous system infarction; and/or  intrathecal granuloma formation with possible spinal cord compression and permanent paralysis.  Before refilling the pump Kendra Nelson was informed that some of the medications used in the devise may not be FDA approved for such use and therefore it constitutes an off-label use of the medications.  Finally, she was informed that Medicine is not an exact science; therefore, there is also the possibility of unforeseen or unpredictable risks and/or possible complications that may result in a catastrophic outcome. The patient indicated having understood very clearly. We have given the patient no guarantees and we have made no promises. Enough time was given to the patient to ask questions, all of which were answered to the patient's satisfaction. Kendra Nelson has indicated that she wanted to continue with the procedure. Attestation: I, the ordering provider, attest that I have discussed with the patient the benefits, risks, side-effects, alternatives, likelihood of achieving goals, and potential problems during recovery for the procedure that I have provided informed consent. Date: 09/16/2017; Time: 8:11 AM  Pre-Procedure Preparation:  Monitoring: As per clinic protocol. Respiration, ETCO2, SpO2, BP, heart rate and rhythm monitor placed and checked for adequate function Safety Precautions: Patient was assessed for positional comfort and pressure points before starting the procedure. Time-out: I initiated and conducted the "Time-out" before starting the procedure, as per protocol. The patient was asked to participate by confirming the accuracy of the "Time Out" information. Verification of the correct person, site, and procedure were performed and confirmed by me, the nursing staff, and the patient. "Time-out" conducted as per Joint Commission's Universal Protocol (UP.01.01.01). "Time-out" Date & Time: 09/16/2017; 1138 hrs.  Description of Procedure Process:   Position: Supine Target Area: Central-port of  intrathecal pump. Approach: Anterior, 90 degree angle approach. Area Prepped: Entire Area around the pump implant. Prepping solution: ChloraPrep (2% chlorhexidine gluconate and 70% isopropyl alcohol) Safety Precautions: Aspiration looking for blood return was conducted prior to all injections. At no point did we inject any substances, as a needle was being advanced. No attempts were made at seeking any paresthesias. Safe injection practices and needle disposal techniques used.  Medications properly checked for expiration dates. SDV (single dose vial) medications used. Description of the Procedure: Protocol guidelines were followed. Two nurses trained to do implant refills were present during the entire procedure. The refill medication was checked by both healthcare providers as well as the patient. The patient was included in the "Time-out" to verify the medication. The patient was placed in position. The pump was identified. The area was prepped in the usual manner. The sterile template was positioned over the pump, making sure the side-port location matched that of the pump. Both, the pump and the template were held for stability. The needle provided in the Medtronic Kit was then introduced thru the center of the template and into the central port. The pump content was aspirated and discarded volume documented. The new medication was slowly infused into the pump, thru the filter, making sure to avoid overpressure of the device. The needle was then removed and the area cleansed, making sure to leave some of the prepping solution back to take advantage of its long term bactericidal properties. The pump was interrogated and programmed to reflect the correct medication, volume, and dosage. The program was printed and taken to the physician for approval. Once checked and signed by the physician, a copy was provided to the patient and another scanned into the EMR. Vitals:   09/16/17 1122 09/16/17 1129  BP: (!)  211/96 (!) 184/91  Pulse: 74   Resp: 16   Temp: 98.2 F (36.8 C)   TempSrc: Oral   SpO2: 100%   Weight: 140 lb (63.5 kg)   Height: '5\' 3"'  (1.6 m)     Start Time: 1145 hrs. End Time: 1150 hrs. Materials & Medications: Medtronic Refill Kit Medication(s): Please see chart orders for details.  Imaging Guidance:  Type of Imaging Technique: None used Indication(s): N/A Exposure Time: No patient exposure Contrast: None used. Fluoroscopic Guidance: N/A Ultrasound Guidance: N/A Interpretation: N/A  Antibiotic Prophylaxis:  Indication(s): None identified Antibiotic given: None  Post-operative Assessment:  EBL: None Complications: No immediate post-treatment complications observed by team, or reported by patient. Note: The patient tolerated the entire procedure well. A repeat set of vitals were taken after the procedure and the patient was kept under observation following institutional policy, for this type of procedure. Post-procedural neurological assessment was performed, showing return to baseline, prior to discharge. The patient was provided with post-procedure discharge instructions, including a section on how to identify potential problems. Should any problems arise concerning this procedure, the patient was given instructions to immediately contact us, at any time, without hesitation. In any case, we plan to contact the patient by telephone for a follow-up status report regarding this interventional procedure. Comments:  No additional relevant information.  Controlled Substance Pharmacotherapy Assessment REMS (Risk Evaluation and Mitigation Strategy)  Analgesic: Oxycodone IR 15 mg every 6 hours (80m/dayof oxycodone) (90MME/day) (04/18/2017) + intrathecal pump medication (fentanyl) (825 MME/day) (915MME/day) Highest recorded MME/day: (915MME/day) MME/day: (915MME/day)  No notes on file Pharmacokinetics: Liberation and absorption (onset of action): WNL Distribution (time to  peak effect): WNL Metabolism and excretion (duration of action): WNL         Pharmacodynamics: Desired effects: Analgesia: Ms. MSpeakerreports >50% benefit. Functional ability: Patient reports that medication allows her to accomplish basic ADLs Clinically meaningful improvement in function (CMIF): Sustained CMIF goals met Perceived effectiveness: Described as relatively effective, allowing for increase in activities of daily living (ADL) Undesirable effects: Side-effects or Adverse reactions: None reported Monitoring: [place] PMP: Online review of  the past 55-monthperiod conducted. Compliant with practice rules and regulations Last UDS on record: Summary  Date Value Ref Range Status  05/26/2017 FINAL  Final    Comment:    ==================================================================== [facility] COMP DRUG ANALYSIS,UR ==================================================================== Test                             Result       Flag       Units Drug Present and Declared for Prescription Verification   Oxycodone                      5404         EXPECTED   ng/mg creat   Oxymorphone                    5564         EXPECTED   ng/mg creat   Noroxycodone                   8076         EXPECTED   ng/mg creat   Noroxymorphone                 1398         EXPECTED   ng/mg creat    Sources of oxycodone are scheduled prescription medications.    Oxymorphone, noroxycodone, and noroxymorphone are expected    metabolites of oxycodone. Oxymorphone is also available as a    scheduled prescription medication.   Cyclobenzaprine                PRESENT      EXPECTED   Desmethylcyclobenzaprine       PRESENT      EXPECTED    Desmethylcyclobenzaprine is an expected metabolite of    cyclobenzaprine.   Propranolol                    PRESENT      EXPECTED Drug Present not Declared for Prescription Verification   Fentanyl                       6            UNEXPECTED ng/mg creat   Norfentanyl                     39           UNEXPECTED ng/mg creat    Source of fentanyl is a scheduled prescription medication,    including IV, patch, and transmucosal formulations. Norfentanyl    is an expected metabolite of fentanyl. Drug Absent but Declared for Prescription Verification   Salicylate                     Not Detected UNEXPECTED    Aspirin, as indicated in the declared medication list, is not    always detected even when used as directed. ==================================================================== Test                      Result    Flag   Units      Ref Range   Creatinine              80               mg/dL      >=20 ====================================================================  Declared Medications:  The flagging and interpretation on this report are based on the  following declared medications.  Unexpected results may arise from  inaccuracies in the declared medications.  **Note: The testing scope of this panel includes these medications:  Cyclobenzaprine (Flexeril)  Oxycodone (Roxicodone)  Propranolol (Inderal)  **Note: The testing scope of this panel does not include small to  moderate amounts of these reported medications:  Aspirin (Aspirin 81)  **Note: The testing scope of this panel does not include following  reported medications:  Atorvastatin (Lipitor)  Lisinopril  Meloxicam  Multivitamin  Topical (Lidex) ==================================================================== For clinical consultation, please call [phone]. ====================================================================    UDS interpretation: Compliant          Medication Assessment Form: Reviewed. Patient indicates being compliant with therapy Treatment compliance: Compliant Risk Assessment Profile: Aberrant behavior: See prior evaluations. None observed or detected today Comorbid factors increasing risk of overdose: See prior notes. No additional risks detected today Risk of  substance use disorder (SUD): Low Opioid Risk Tool - 09/16/17 1131      Family History of Substance Abuse   Alcohol  Positive Female    Illegal Drugs  Positive Female    Rx Drugs  Positive Female or Female      Personal History of Substance Abuse   Alcohol  Negative    Illegal Drugs  Negative    Rx Drugs  Negative      Psychological Disease   Psychological Disease  Negative    Depression  Negative      Total Score   Opioid Risk Tool Scoring  7    Opioid Risk Interpretation  Moderate Risk      ORT Scoring interpretation table:  Score <3 = Low Risk for SUD  Score between 4-7 = Moderate Risk for SUD  Score >8 = High Risk for Opioid Abuse   Risk Mitigation Strategies:  Patient Counseling: Covered Patient-Prescriber Agreement (PPA): Present and active  Notification to other healthcare providers: Done  Pharmacologic Plan: No change in therapy, at this time.             Assessment  Primary Diagnosis & Pertinent Problem List: The primary encounter diagnosis was Chronic pain syndrome. Diagnoses of Chronic low back pain (Primary Area of Pain) (Bilateral) (R>L), Chronic lower extremity pain (Secondary Area of Pain) (Bilateral) (R>L), Failed back surgical syndrome, Adjustment and management of infusion pump, and Presence of intrathecal pump were also pertinent to this visit.  Status Diagnosis  Controlled Controlled Controlled 1. Chronic pain syndrome   2. Chronic low back pain (Primary Area of Pain) (Bilateral) (R>L)   3. Chronic lower extremity pain (Secondary Area of Pain) (Bilateral) (R>L)   4. Failed back surgical syndrome   5. Adjustment and management of infusion pump   6. Presence of intrathecal pump     Problems updated and reviewed during this visit: Problem  Adjustment and Management of Infusion Pump   Plan of Care   Imaging Orders  No imaging studies ordered today    Procedure Orders     PUMP REFILL     PUMP REFILL  Medications ordered for procedure: No  orders of the defined types were placed in this encounter.  Medications administered: Kendra Nelson had no medications administered during this visit.  See the medical record for exact dosing, route, and time of administration.  New Prescriptions   No medications on file   Disposition: Discharge home  Discharge Date & Time: 09/16/2017; 1245 hrs.  Physician-requested Follow-up: Return for Pump Refill (as per pump program).  Future Appointments  Date Time Provider [place]  09/29/2017  9:30 AM Milinda Pointer, MD [facility] None  12/14/2017 11:00 AM Milinda Pointer, MD [facility] None   Primary Care Physician: Bernerd Limbo, MD Location: [facility] Outpatient Pain Management Facility Note by: Gaspar Cola, MD Date: 09/16/2017; Time: 2:32 PM  Disclaimer:  Medicine is not an exact science. The only guarantee in medicine is that nothing is guaranteed. It is important to note that the decision to proceed with this intervention was based on the information collected from the patient. The Data and conclusions were drawn from the patient's questionnaire, the interview, and the physical examination. Because the information was provided in large part by the patient, it cannot be guaranteed that it has not been purposely or unconsciously manipulated. Every effort has been made to obtain as much relevant data as possible for this evaluation. It is important to note that the conclusions that lead to this procedure are derived in large part from the available data. Always take into account that the treatment will also be dependent on availability of resources and existing treatment guidelines, considered by other Pain Management Practitioners as being common knowledge and practice, at the time of the intervention. For Medico-Legal purposes, it is also important to point out that variation in procedural techniques and pharmacological choices are the acceptable norm. The indications,  contraindications, technique, and results of the above procedure should only be interpreted and judged by a Board-Certified Interventional Pain Specialist with extensive familiarity and expertise in the same exact procedure and technique.

## 2017-09-20 MED FILL — Medication: INTRATHECAL | Qty: 1 | Status: AC

## 2017-09-29 ENCOUNTER — Ambulatory Visit: Payer: Medicare Other | Attending: Pain Medicine | Admitting: Pain Medicine

## 2017-09-29 ENCOUNTER — Other Ambulatory Visit: Payer: Self-pay

## 2017-09-29 ENCOUNTER — Encounter: Payer: Self-pay | Admitting: Pain Medicine

## 2017-09-29 VITALS — BP 192/90 | HR 70 | Temp 97.8°F | Ht 63.0 in | Wt 140.0 lb

## 2017-09-29 DIAGNOSIS — M5442 Lumbago with sciatica, left side: Secondary | ICD-10-CM | POA: Diagnosis not present

## 2017-09-29 DIAGNOSIS — F4322 Adjustment disorder with anxiety: Secondary | ICD-10-CM | POA: Diagnosis not present

## 2017-09-29 DIAGNOSIS — G4700 Insomnia, unspecified: Secondary | ICD-10-CM | POA: Insufficient documentation

## 2017-09-29 DIAGNOSIS — E7800 Pure hypercholesterolemia, unspecified: Secondary | ICD-10-CM | POA: Diagnosis not present

## 2017-09-29 DIAGNOSIS — G8929 Other chronic pain: Secondary | ICD-10-CM

## 2017-09-29 DIAGNOSIS — Z978 Presence of other specified devices: Secondary | ICD-10-CM

## 2017-09-29 DIAGNOSIS — M5136 Other intervertebral disc degeneration, lumbar region: Secondary | ICD-10-CM

## 2017-09-29 DIAGNOSIS — G894 Chronic pain syndrome: Secondary | ICD-10-CM

## 2017-09-29 DIAGNOSIS — M5134 Other intervertebral disc degeneration, thoracic region: Secondary | ICD-10-CM | POA: Diagnosis not present

## 2017-09-29 DIAGNOSIS — M5417 Radiculopathy, lumbosacral region: Secondary | ICD-10-CM | POA: Diagnosis not present

## 2017-09-29 DIAGNOSIS — M79605 Pain in left leg: Secondary | ICD-10-CM | POA: Insufficient documentation

## 2017-09-29 DIAGNOSIS — Z7982 Long term (current) use of aspirin: Secondary | ICD-10-CM | POA: Insufficient documentation

## 2017-09-29 DIAGNOSIS — G2581 Restless legs syndrome: Secondary | ICD-10-CM | POA: Insufficient documentation

## 2017-09-29 DIAGNOSIS — K5900 Constipation, unspecified: Secondary | ICD-10-CM | POA: Insufficient documentation

## 2017-09-29 DIAGNOSIS — L409 Psoriasis, unspecified: Secondary | ICD-10-CM | POA: Insufficient documentation

## 2017-09-29 DIAGNOSIS — M25562 Pain in left knee: Secondary | ICD-10-CM | POA: Insufficient documentation

## 2017-09-29 DIAGNOSIS — M961 Postlaminectomy syndrome, not elsewhere classified: Secondary | ICD-10-CM | POA: Diagnosis not present

## 2017-09-29 DIAGNOSIS — M5126 Other intervertebral disc displacement, lumbar region: Secondary | ICD-10-CM | POA: Diagnosis not present

## 2017-09-29 DIAGNOSIS — M47816 Spondylosis without myelopathy or radiculopathy, lumbar region: Secondary | ICD-10-CM

## 2017-09-29 DIAGNOSIS — M545 Low back pain: Secondary | ICD-10-CM | POA: Diagnosis not present

## 2017-09-29 DIAGNOSIS — M79604 Pain in right leg: Secondary | ICD-10-CM

## 2017-09-29 DIAGNOSIS — Z79899 Other long term (current) drug therapy: Secondary | ICD-10-CM | POA: Diagnosis not present

## 2017-09-29 DIAGNOSIS — Z79891 Long term (current) use of opiate analgesic: Secondary | ICD-10-CM

## 2017-09-29 DIAGNOSIS — M5441 Lumbago with sciatica, right side: Secondary | ICD-10-CM

## 2017-09-29 DIAGNOSIS — Z9689 Presence of other specified functional implants: Secondary | ICD-10-CM

## 2017-09-29 DIAGNOSIS — F4001 Agoraphobia with panic disorder: Secondary | ICD-10-CM | POA: Insufficient documentation

## 2017-09-29 DIAGNOSIS — M792 Neuralgia and neuritis, unspecified: Secondary | ICD-10-CM

## 2017-09-29 DIAGNOSIS — F17210 Nicotine dependence, cigarettes, uncomplicated: Secondary | ICD-10-CM | POA: Insufficient documentation

## 2017-09-29 DIAGNOSIS — I10 Essential (primary) hypertension: Secondary | ICD-10-CM | POA: Insufficient documentation

## 2017-09-29 DIAGNOSIS — M25462 Effusion, left knee: Secondary | ICD-10-CM | POA: Insufficient documentation

## 2017-09-29 DIAGNOSIS — F1190 Opioid use, unspecified, uncomplicated: Secondary | ICD-10-CM

## 2017-09-29 DIAGNOSIS — R635 Abnormal weight gain: Secondary | ICD-10-CM | POA: Diagnosis not present

## 2017-09-29 DIAGNOSIS — M546 Pain in thoracic spine: Secondary | ICD-10-CM | POA: Diagnosis present

## 2017-09-29 DIAGNOSIS — M51369 Other intervertebral disc degeneration, lumbar region without mention of lumbar back pain or lower extremity pain: Secondary | ICD-10-CM

## 2017-09-29 MED ORDER — OXYCODONE HCL 10 MG PO TABS: 10.0000 mg | ORAL_TABLET | Freq: Four times a day (QID) | ORAL | 0 refills | Status: DC | PRN

## 2017-09-29 NOTE — Patient Instructions (Addendum)
____________________________________________________________________________________________  Pain Scale  Introduction: The pain score used by this practice is the Verbal Numerical Rating Scale (VNRS-11). This is an 11-point scale. It is for adults and children 10 years or older. There are significant differences in how the pain score is reported, used, and applied. Forget everything you learned in the past and learn this scoring system.  General Information: The scale should reflect your current level of pain. Unless you are specifically asked for the level of your worst pain, or your average pain. If you are asked for one of these two, then it should be understood that it is over the past 24 hours.  Basic Activities of Daily Living (ADL): Personal hygiene, dressing, eating, transferring, and using restroom.  Instructions: Most patients tend to report their level of pain as a combination of two factors, their physical pain and their psychosocial pain. This last one is also known as "suffering" and it is reflection of how physical pain affects you socially and psychologically. From now on, report them separately. From this point on, when asked to report your pain level, report only your physical pain. Use the following table for reference.  Pain Clinic Pain Levels (0-5/10)  Pain Level Score  Description  No Pain 0   Mild pain 1 Nagging, annoying, but does not interfere with basic activities of daily living (ADL). Patients are able to eat, bathe, get dressed, toileting (being able to get on and off the toilet and perform personal hygiene functions), transfer (move in and out of bed or a chair without assistance), and maintain continence (able to control bladder and bowel functions). Blood pressure and heart rate are unaffected. A normal heart rate for a healthy adult ranges from 60 to 100 bpm (beats per minute).   Mild to moderate pain 2 Noticeable and distracting. Impossible to hide from other  people. More frequent flare-ups. Still possible to adapt and function close to normal. It can be very annoying and may have occasional stronger flare-ups. With discipline, patients may get used to it and adapt.   Moderate pain 3 Interferes significantly with activities of daily living (ADL). It becomes difficult to feed, bathe, get dressed, get on and off the toilet or to perform personal hygiene functions. Difficult to get in and out of bed or a chair without assistance. Very distracting. With effort, it can be ignored when deeply involved in activities.   Moderately severe pain 4 Impossible to ignore for more than a few minutes. With effort, patients may still be able to manage work or participate in some social activities. Very difficult to concentrate. Signs of autonomic nervous system discharge are evident: dilated pupils (mydriasis); mild sweating (diaphoresis); sleep interference. Heart rate becomes elevated (>115 bpm). Diastolic blood pressure (lower number) rises above 100 mmHg. Patients find relief in laying down and not moving.   Severe pain 5 Intense and extremely unpleasant. Associated with frowning face and frequent crying. Pain overwhelms the senses.  Ability to do any activity or maintain social relationships becomes significantly limited. Conversation becomes difficult. Pacing back and forth is common, as getting into a comfortable position is nearly impossible. Pain wakes you up from deep sleep. Physical signs will be obvious: pupillary dilation; increased sweating; goosebumps; brisk reflexes; cold, clammy hands and feet; nausea, vomiting or dry heaves; loss of appetite; significant sleep disturbance with inability to fall asleep or to remain asleep. When persistent, significant weight loss is observed due to the complete loss of appetite and sleep deprivation.  Blood   pressure and heart rate becomes significantly elevated. Caution: If elevated blood pressure triggers a pounding headache  associated with blurred vision, then the patient should immediately seek attention at an urgent or emergency care unit, as these may be signs of an impending stroke.    Emergency Department Pain Levels (6-10/10)  Emergency Room Pain 6 Severely limiting. Requires emergency care and should not be seen or managed at an outpatient pain management facility. Communication becomes difficult and requires great effort. Assistance to reach the emergency department may be required. Facial flushing and profuse sweating along with potentially dangerous increases in heart rate and blood pressure will be evident.   Distressing pain 7 Self-care is very difficult. Assistance is required to transport, or use restroom. Assistance to reach the emergency department will be required. Tasks requiring coordination, such as bathing and getting dressed become very difficult.   Disabling pain 8 Self-care is no longer possible. At this level, pain is disabling. The individual is unable to do even the most "basic" activities such as walking, eating, bathing, dressing, transferring to a bed, or toileting. Fine motor skills are lost. It is difficult to think clearly.   Incapacitating pain 9 Pain becomes incapacitating. Thought processing is no longer possible. Difficult to remember your own name. Control of movement and coordination are lost.   The worst pain imaginable 10 At this level, most patients pass out from pain. When this level is reached, collapse of the autonomic nervous system occurs, leading to a sudden drop in blood pressure and heart rate. This in turn results in a temporary and dramatic drop in blood flow to the brain, leading to a loss of consciousness. Fainting is one of the body's self defense mechanisms. Passing out puts the brain in a calmed state and causes it to shut down for a while, in order to begin the healing process.    Summary: 1. Refer to this scale when providing [place] with your pain level. 2. Be  accurate and careful when reporting your pain level. This will help with your care. 3. Over-reporting your pain level will lead to loss of credibility. 4. Even a level of 1/10 means that there is pain and will be treated at our facility. 5. High, inaccurate reporting will be documented as "Symptom Exaggeration", leading to loss of credibility and suspicions of possible secondary gains such as obtaining more narcotics, or wanting to appear disabled, for fraudulent reasons. 6. Only pain levels of 5 or below will be seen at our facility. 7. Pain levels of 6 and above will be sent to the Emergency Department and the appointment cancelled. ____________________________________________________________________________________________     ____________________________________________________________________________________________  Preparing for Procedure with Sedation Instructions: . Oral Intake: Do not eat or drink anything for at least 8 hours prior to your procedure. . Transportation: Public transportation is not allowed. Bring an adult driver. The driver must be physically present in our waiting room before any procedure can be started. [facility] Physical Assistance: Bring an adult physically capable of assisting you, in the event you need help. This adult should keep you company at home for at least 6 hours after the procedure. . Blood Pressure Medicine: Take your blood pressure medicine with a sip of water the morning of the procedure. . Blood thinners:  . Diabetics on insulin: Notify the staff so that you can be scheduled 1st case in the morning. If your diabetes requires high dose insulin, take only  of your normal insulin dose the morning of the procedure  and notify the staff that you have done so. . Preventing infections: Shower with an antibacterial soap the morning of your procedure. . Build-up your immune system: Take 1000 mg of Vitamin C with every meal (3 times a day) the day prior to your  procedure. [facility] Antibiotics: Inform the staff if you have a condition or reason that requires you to take antibiotics before dental procedures. . Pregnancy: If you are pregnant, call and cancel the procedure. . Sickness: If you have a cold, fever, or any active infections, call and cancel the procedure. . Arrival: You must be in the facility at least 30 minutes prior to your scheduled procedure. . Children: Do not bring children with you. . Dress appropriately: Bring dark clothing that you would not mind if they get stained. . Valuables: Do not bring any jewelry or valuables. Procedure appointments are reserved for interventional treatments only. [facility] No Prescription Refills. . No medication changes will be discussed during procedure appointments. . No disability issues will be discussed. ____________________________________________________________________________________________   Look for information of [facility].

## 2017-09-29 NOTE — Progress Notes (Signed)
Patient's Name: Kendra Nelson  MRN: [id number]  Referring Provider: Bernerd Limbo, MD  DOB: January 29, 1961  PCP: Bernerd Limbo, MD  DOS: 09/29/2017  Note by: Gaspar Cola, MD  Service setting: Ambulatory outpatient  Specialty: Interventional Pain Management  Location: [facility] (AMB) Pain Management Facility    Patient type: Established   Primary Reason(s) for Visit: Encounter for prescription drug management. (Level of risk: moderate)  CC: Back Pain (mid)  HPI  Kendra Nelson is a 57 y.o. year old, female patient, who comes today for a medication management evaluation. She has Unspecified constipation; Abnormal weight gain; Adjustment disorder with anxiety; Agoraphobia with panic disorder; Benign essential hypertension; Chronic back pain greater than 3 months duration; Chronic knee pain (Left); Cigarette nicotine dependence without complication; Opioid-induced constipation (OIC); Chronic lumbosacral radiculopathy (L5) (Left); Muscle spasm of back; Need for influenza vaccination; Need for hepatitis C screening test; Palpitations; Primary insomnia; Psoriasis; Psoriasis of nail; Pure hypercholesterolemia; Pharmacologic therapy; Presence of intrathecal pump; Adjustment and management of infusion pump; Disorder of skeletal system; Chronic pain syndrome; Problems influencing health status; 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  Her primarily concern today is the Back Pain (mid)  Pain Assessment: Location: Mid, Lower Back Radiating: radiates to buttock and leg and up toward mid back Onset: More than a month ago Duration: Chronic pain Quality: Aching, Shooting, Constant Severity: 8 /10 (self-reported pain score)  Note: Reported level is inconsistent with clinical observations. Clinically the patient looks like a 2/10 A 2/10 is viewed as "Mild to Moderate" and described as noticeable and distracting. Impossible to hide from other people. More frequent flare-ups. Still possible to adapt and function close to normal. It can be very annoying and may have occasional stronger flare-ups. With discipline, patients may get used to it and adapt. Information on the proper use of the pain scale provided to the patient today. When using our objective Pain Scale, levels between 6 and 10/10 are said to belong in an emergency room, as it progressively worsens from a 6/10, described as severely limiting, requiring emergency care not usually available at an outpatient pain management facility. At a 6/10 level, communication becomes difficult and requires great effort. Assistance to reach the emergency department may be required. Facial flushing and profuse sweating along with potentially dangerous increases in heart rate and blood pressure will be evident. Timing: Constant Modifying factors: walking ,laying  Kendra Nelson was last scheduled for an appointment on 09/16/2017 for medication management. During today's appointment we reviewed Kendra Nelson's chronic pain status, as well as her outpatient medication regimen.  The patient returns to the clinic today after having completed an opioid taper where she went down to 0 on 09/17/2017.  Her "Drug Holiday" will be completed by 10/01/2017.  At this point, we will go back on her pain medication, but at a maximum of 50 MME/day.  Today I have talked to the patient about several alternatives to treat her  pain including a possible diagnostic caudal epidural steroid injection and epidurogram for the purpose of determining if she  would be a good candidate for a RACZ procedure.  I have also encouraged the patient to look in the Internet for information about this RACZ procedure to see if it something that she would be interested in.  If it is, then we will make arrangements to do the caudal epidural before we go along with the RACZ.  The patient  reports that she does not use drugs. Her body mass index is 24.8 kg/m.  Further details on both, my assessment(s), as well as the proposed treatment plan, please see below.  Controlled Substance Pharmacotherapy Assessment REMS (Risk Evaluation and Mitigation Strategy)  Analgesic: Oxycodone IR 10 mg 1 tablet p.o. every 6 hours (40 mg/day of oxycodone) (50 MME/day) MME/day: 50 mg/day.  No notes on file Pharmacokinetics: Liberation and absorption (onset of action): WNL Distribution (time to peak effect): WNL Metabolism and excretion (duration of action): WNL         Pharmacodynamics: Desired effects: Analgesia: Kendra Nelson reports >50% benefit. Functional ability: Patient reports that medication allows her to accomplish basic ADLs Clinically meaningful improvement in function (CMIF): Sustained CMIF goals met Perceived effectiveness: Described as relatively effective, allowing for increase in activities of daily living (ADL) Undesirable effects: Side-effects or Adverse reactions: None reported Monitoring: [place] PMP: Online review of the past 21-monthperiod conducted. Compliant with practice rules and regulations Last UDS on record: Summary  Date Value Ref Range Status  05/26/2017 FINAL  Final    Comment:    ==================================================================== [facility] COMP DRUG ANALYSIS,UR ==================================================================== Test                             Result       Flag       Units Drug  Present and Declared for Prescription Verification   Oxycodone                      5404         EXPECTED   ng/mg creat   Oxymorphone                    5564         EXPECTED   ng/mg creat   Noroxycodone                   8076         EXPECTED   ng/mg creat   Noroxymorphone                 1398         EXPECTED   ng/mg creat    Sources of oxycodone are scheduled prescription medications.    Oxymorphone, noroxycodone, and noroxymorphone are expected    metabolites of oxycodone. Oxymorphone is also available as a    scheduled prescription medication.   Cyclobenzaprine                PRESENT      EXPECTED   Desmethylcyclobenzaprine       PRESENT      EXPECTED    Desmethylcyclobenzaprine is an expected metabolite of    cyclobenzaprine.   Propranolol                    PRESENT      EXPECTED Drug Present not Declared for Prescription Verification   Fentanyl  6            UNEXPECTED ng/mg creat   Norfentanyl                    39           UNEXPECTED ng/mg creat    Source of fentanyl is a scheduled prescription medication,    including IV, patch, and transmucosal formulations. Norfentanyl    is an expected metabolite of fentanyl. Drug Absent but Declared for Prescription Verification   Salicylate                     Not Detected UNEXPECTED    Aspirin, as indicated in the declared medication list, is not    always detected even when used as directed. ==================================================================== Test                      Result    Flag   Units      Ref Range   Creatinine              80               mg/dL      >=20 ==================================================================== Declared Medications:  The flagging and interpretation on this report are based on the  following declared medications.  Unexpected results may arise from  inaccuracies in the declared medications.  **Note: The testing scope of this panel includes these medications:   Cyclobenzaprine (Flexeril)  Oxycodone (Roxicodone)  Propranolol (Inderal)  **Note: The testing scope of this panel does not include small to  moderate amounts of these reported medications:  Aspirin (Aspirin 81)  **Note: The testing scope of this panel does not include following  reported medications:  Atorvastatin (Lipitor)  Lisinopril  Meloxicam  Multivitamin  Topical (Lidex) ==================================================================== For clinical consultation, please call [phone]. ====================================================================    UDS interpretation: Unexpected findings not considered significantly abnormal Medication found to be not declared originated from her intrathecal pump. Medication Assessment Form: Reviewed. Patient indicates being compliant with therapy Treatment compliance: Compliant Risk Assessment Profile: Aberrant behavior: See prior evaluations. None observed or detected today Comorbid factors increasing risk of overdose: See prior notes. No additional risks detected today Risk of substance use disorder (SUD): Low Opioid Risk Tool - 09/29/17 0935      Family History of Substance Abuse   Alcohol  Positive Female    Illegal Drugs  Positive Female    Rx Drugs  Positive Female or Female      Personal History of Substance Abuse   Alcohol  Negative    Illegal Drugs  Negative    Rx Drugs  Negative      Total Score   Opioid Risk Tool Scoring  7    Opioid Risk Interpretation  Moderate Risk      ORT Scoring interpretation table:  Score <3 = Low Risk for SUD  Score between 4-7 = Moderate Risk for SUD  Score >8 = High Risk for Opioid Abuse   Risk Mitigation Strategies:  Patient Counseling: Covered Patient-Prescriber Agreement (PPA): Present and active  Notification to other healthcare providers: Done  Pharmacologic Plan: No change in therapy, at this time.             Laboratory Chemistry  Inflammation Markers (CRP:  Acute Phase) (ESR: Chronic Phase) Lab Results  Component Value Date   CRP 9.2 (H) 05/26/2017   ESRSEDRATE 26 05/26/2017  Rheumatology Markers Lab Results  Component Value Date   ANA NEG 10/13/2007                Renal Function Markers Lab Results  Component Value Date   BUN 13 05/26/2017   CREATININE 0.70 05/26/2017   GFRAA 113 05/26/2017   GFRNONAA 98 05/26/2017                 Hepatic Function Markers Lab Results  Component Value Date   AST 27 05/26/2017   ALT 52 (H) 10/05/2007   ALBUMIN 4.3 05/26/2017   ALKPHOS 108 05/26/2017   HCVAB NEG 10/13/2007                 Electrolytes Lab Results  Component Value Date   NA 141 05/26/2017   K 4.4 05/26/2017   CL 100 05/26/2017   CALCIUM 9.7 05/26/2017   MG 2.1 05/26/2017                 Neuropathy Markers Lab Results  Component Value Date   VITAMINB12 617 05/26/2017                 Bone Pathology Markers Lab Results  Component Value Date   25OHVITD1 33 05/26/2017   25OHVITD2 <1.0 05/26/2017   25OHVITD3 33 05/26/2017                 Coagulation Parameters Lab Results  Component Value Date   PLT 255 06/09/2016                 Cardiovascular Markers Lab Results  Component Value Date   HGB 13.6 06/09/2016   HCT 41.1 06/09/2016                 Note: Lab results reviewed.  Recent Diagnostic Imaging Results  VAS [place] LOWER EXTREMITY VENOUS (DVT)                     *[facility]*                         [address], [place] [address]                            [phone]  ------------------------------------------------------------------- Noninvasive Vascular Lab  Left Lower Extremity Venous Duplex Evaluation  Patient:    Darionna, Banke MR #:       [id number] Study Date: 12/11/2016 Gender:     F Age:        40 Height: Weight: BSA: Pt. Status: Room:   ATTENDING    Default,  Provider [phone]  Royden Purl, Dominic W  REFERRING    Mckinley, Dominic W  SONOGRAPHER  Maudry Mayhew, RVT, RDCS, RDMS  Reports also to:  ------------------------------------------------------------------- History and indications:  Indications  Knee swelling.  History  Diagnostic evaluation.  ------------------------------------------------------------------- Study information:  Study status:  Routine.  Procedure:  A vascular evaluation was performed with the patient in the supine position. The right common femoral, left common femoral, left femoral, left profunda femoral, left  popliteal, left peroneal, and left posterior tibial veins were studied. Image quality was adequate.    Left lower extremity venous duplex evaluation.     Doppler flow study including B-mode compression maneuvers of all visualized segments, color flow Doppler and selected views of pulsed wave Doppler.  Birthdate: Patient birthdate: 1961/06/17.  Age:  Patient is 57 yr old.  Sex: Gender: female.  Study date:  Study date: 12/11/2016. Study time: 09:33 AM.  Location:  Vascular laboratory.  Patient status: Outpatient.  Venous flow:  +--------------------------+-------+------------------------------+ !Location                  !Overall!Flow properties               ! +--------------------------+-------+------------------------------+ !Left common femoral       !Patent !Phasic; spontaneous;          ! !                          !       !compressible                  ! +--------------------------+-------+------------------------------+ !Left femoral              !Patent !Compressible                  ! +--------------------------+-------+------------------------------+ !Left profunda femoral     !Patent !Compressible                  ! +--------------------------+-------+------------------------------+ !Left popliteal            !Patent !Phasic; spontaneous;           ! !                          !       !compressible                  ! +--------------------------+-------+------------------------------+ !Left posterior tibial     !Patent !Compressible                  ! +--------------------------+-------+------------------------------+ !Left peroneal             !Patent !Compressible                  ! +--------------------------+-------+------------------------------+ !Left saphenofemoral       !Patent !Compressible                  ! !junction                  !       !                              ! +--------------------------+-------+------------------------------+ !Right common femoral      !Patent !Phasic; spontaneous;          ! !                          !       !compressible                  ! +--------------------------+-------+------------------------------+  ------------------------------------------------------------------- Summary:  - No evidence of deep vein thrombosis involving the left lower   extremity and right common femoral vein. - No evidence of Baker&'s cyst on the left. -  In the area of concern, the left lateral proximal anterior knee,   there is a heterogenous area anterior to the knee, suggestive of   a possible complex fluid collection. Etiology is unknown.  Other specific details can be found in the table(s) above. Prepared and Electronically Authenticated by  Kendra Alf, MD 2018-04-15T17:55:42  Complexity Note: Imaging results reviewed. Results shared with Kendra Nelson, using Layman's terms.                         Meds   Current Outpatient Medications:  .  acitretin (SORIATANE) 25 MG capsule, Take 1 capsule by mouth daily. , Disp: , Rfl:  .  aspirin EC 81 MG tablet, Take 81 mg by mouth daily., Disp: , Rfl:  .  atorvastatin (LIPITOR) 20 MG tablet, Take 20 mg by mouth daily. , Disp: , Rfl:  .  cyclobenzaprine (FLEXERIL) 10 MG tablet, Take 10 mg by mouth at bedtime. , Disp: , Rfl:  .  lisinopril  (PRINIVIL,ZESTRIL) 20 MG tablet, Take 20 mg by mouth daily. , Disp: , Rfl:  .  meloxicam (MOBIC) 15 MG tablet, Take 15 mg by mouth daily. , Disp: , Rfl:  .  Multiple Vitamin (MULTIVITAMIN WITH MINERALS) TABS, Take 1 tablet by mouth daily., Disp: , Rfl:  .  [START ON 11/30/2017] Oxycodone HCl 10 MG TABS, Take 1 tablet (10 mg total) by mouth every 6 (six) hours as needed., Disp: 120 tablet, Rfl: 0 .  [START ON 10/31/2017] Oxycodone HCl 10 MG TABS, Take 1 tablet (10 mg total) by mouth every 6 (six) hours as needed., Disp: 120 tablet, Rfl: 0 .  [START ON 10/01/2017] Oxycodone HCl 10 MG TABS, Take 1 tablet (10 mg total) by mouth every 6 (six) hours as needed., Disp: 120 tablet, Rfl: 0 .  propranolol (INDERAL) 10 MG tablet, 10 mg 2 (two) times daily. , Disp: , Rfl:   ROS  Constitutional: Denies any fever or chills Gastrointestinal: No reported hemesis, hematochezia, vomiting, or acute GI distress Musculoskeletal: Denies any acute onset joint swelling, redness, loss of ROM, or weakness Neurological: No reported episodes of acute onset apraxia, aphasia, dysarthria, agnosia, amnesia, paralysis, loss of coordination, or loss of consciousness  Allergies  Kendra Nelson is allergic to benadryl [diphenhydramine hcl] and bee venom.  [place]  Drug: Kendra Nelson  reports that she does not use drugs. Alcohol:  reports that she does not drink alcohol. Tobacco:  reports that she has been smoking.  She has a 25.00 pack-year smoking history. she has never used smokeless tobacco. Medical:  has a past medical history of Allergy, Anxiety, Arthritis, Complication of anesthesia, Headache(784.0), Hyperlipidemia, Neuromuscular disorder ([place]), and Skin abnormalities. Surgical: Kendra Nelson  has a past surgical history that includes Spinal cord stimulator insertion; Back surgery; Pain pump implantation; Abdominal hysterectomy; Pain pump implantation (05/10/2012); Colonoscopy; and Spinal cord stimulator battery exchange (N/A,  06/09/2016). Family: family history includes Aortic aneurysm in her mother; Diabetes in her maternal grandmother.  Constitutional Exam  General appearance: Well nourished, well developed, and well hydrated. In no apparent acute distress Vitals:   09/29/17 0925  BP: (!) 192/90  Pulse: 70  Temp: 97.8 F (36.6 C)  SpO2: 98%  Weight: 140 lb (63.5 kg)  Height: '5\' 3"'  (1.6 m)   BMI Assessment: Estimated body mass index is 24.8 kg/m as calculated from the following:   Height as of this encounter: '5\' 3"'  (1.6 m).   Weight as of this  encounter: 140 lb (63.5 kg).  BMI interpretation table: BMI level Category Range association with higher incidence of chronic pain  <18 kg/m2 Underweight   18.5-24.9 kg/m2 Ideal body weight   25-29.9 kg/m2 Overweight Increased incidence by 20%  30-34.9 kg/m2 Obese (Class I) Increased incidence by 68%  35-39.9 kg/m2 Severe obesity (Class II) Increased incidence by 136%  >40 kg/m2 Extreme obesity (Class III) Increased incidence by 254%   BMI Readings from Last 4 Encounters:  09/29/17 24.80 kg/m  09/16/17 24.80 kg/m  06/16/17 24.80 kg/m  05/26/17 25.33 kg/m   Wt Readings from Last 4 Encounters:  09/29/17 140 lb (63.5 kg)  09/16/17 140 lb (63.5 kg)  06/16/17 140 lb (63.5 kg)  05/26/17 143 lb (64.9 kg)  Psych/Mental status: Alert, oriented x 3 (person, place, & time)       Eyes: PERLA Respiratory: No evidence of acute respiratory distress  Cervical Spine Area Exam  Skin & Axial Inspection: No masses, redness, edema, swelling, or associated skin lesions Alignment: Symmetrical Functional ROM: Unrestricted ROM      Stability: No instability detected Muscle Tone/Strength: Functionally intact. No obvious neuro-muscular anomalies detected. Sensory (Neurological): Unimpaired Palpation: No palpable anomalies              Upper Extremity (UE) Exam    Side: Right upper extremity  Side: Left upper extremity  Skin & Extremity Inspection: Skin color,  temperature, and hair growth are WNL. No peripheral edema or cyanosis. No masses, redness, swelling, asymmetry, or associated skin lesions. No contractures.  Skin & Extremity Inspection: Skin color, temperature, and hair growth are WNL. No peripheral edema or cyanosis. No masses, redness, swelling, asymmetry, or associated skin lesions. No contractures.  Functional ROM: Unrestricted ROM          Functional ROM: Unrestricted ROM          Muscle Tone/Strength: Functionally intact. No obvious neuro-muscular anomalies detected.  Muscle Tone/Strength: Functionally intact. No obvious neuro-muscular anomalies detected.  Sensory (Neurological): Unimpaired          Sensory (Neurological): Unimpaired          Palpation: No palpable anomalies              Palpation: No palpable anomalies              Specialized Test(s): Deferred         Specialized Test(s): Deferred          Thoracic Spine Area Exam  Skin & Axial Inspection: No masses, redness, or swelling Alignment: Symmetrical Functional ROM: Unrestricted ROM Stability: No instability detected Muscle Tone/Strength: Functionally intact. No obvious neuro-muscular anomalies detected. Sensory (Neurological): Unimpaired Muscle strength & Tone: No palpable anomalies  Lumbar Spine Area Exam  Skin & Axial Inspection: No masses, redness, or swelling Alignment: Symmetrical Functional ROM: Unrestricted ROM      Stability: No instability detected Muscle Tone/Strength: Functionally intact. No obvious neuro-muscular anomalies detected. Sensory (Neurological): Unimpaired Palpation: No palpable anomalies       Provocative Tests: Lumbar Hyperextension and rotation test: evaluation deferred today       Lumbar Lateral bending test: evaluation deferred today       Patrick's Maneuver: evaluation deferred today                    Gait & Posture Assessment  Ambulation: Unassisted Gait: Relatively normal for age and body habitus Posture: WNL   Lower Extremity Exam     Side: Right lower extremity  Side:  Left lower extremity  Skin & Extremity Inspection: Skin color, temperature, and hair growth are WNL. No peripheral edema or cyanosis. No masses, redness, swelling, asymmetry, or associated skin lesions. No contractures.  Skin & Extremity Inspection: Skin color, temperature, and hair growth are WNL. No peripheral edema or cyanosis. No masses, redness, swelling, asymmetry, or associated skin lesions. No contractures.  Functional ROM: Unrestricted ROM          Functional ROM: Unrestricted ROM          Muscle Tone/Strength: Functionally intact. No obvious neuro-muscular anomalies detected.  Muscle Tone/Strength: Functionally intact. No obvious neuro-muscular anomalies detected.  Sensory (Neurological): Unimpaired  Sensory (Neurological): Unimpaired  Palpation: No palpable anomalies  Palpation: No palpable anomalies   Assessment  Primary Diagnosis & Pertinent Problem List: The primary encounter diagnosis was Chronic low back pain (Primary Area of Pain) (Bilateral) (R>L). 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  Status Diagnosis  Controlled Controlled Controlled 1. Chronic low back pain (Primary Area of Pain) (Bilateral) (R>L)   2. Chronic lower extremity pain (Secondary Area of Pain) (Bilateral) (R>L)   3. Chronic lumbosacral radiculopathy (L5) (Left)   4. 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 Failed back surgical syndrome    6. DDD (degenerative disc disease), lumbar   7. Lumbar facet syndrome (Bilateral) (R>L)   8. DDD (degenerative disc disease), thoracic   9. Chronic knee pain (Left)   10. Effusion of knee (Left)   11. Chronic pain syndrome   12. Neurogenic pain   13. Long term prescription opiate use   14. Opiate use (915 MME/day)   15. Presence of intrathecal pump   16. 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 Oxycodone HCl 10 MG TABS    Sig: Take 1 tablet (10 mg total) by mouth every 6 (six) hours as needed.    Dispense:  120 tablet    Refill:  0    Do not place this medication, or any other prescription from our practice, on "Automatic Refill". Patient may have prescription filled one day early if pharmacy is closed on scheduled refill date. Do not fill until: 11/30/17 To last until: 12/30/17  . Oxycodone HCl 10 MG TABS    Sig: Take 1 tablet (10 mg total) by mouth every 6 (six) hours as needed.    Dispense:  120 tablet    Refill:  0    Do not place this medication, or any other prescription from our practice, on "Automatic Refill". Patient may have prescription filled one day early if pharmacy is closed on scheduled refill date. Do not fill until: 10/31/17 To last until: 11/30/17  . Oxycodone HCl 10 MG TABS    Sig: Take 1 tablet (10 mg total)  by mouth every 6 (six) hours as needed.    Dispense:  120 tablet    Refill:  0    Do not place this medication, or any other prescription from our practice, on "Automatic Refill". Patient may have prescription filled one day early if pharmacy is closed on scheduled  refill date. Do not fill until: 10/01/17 To last until: 10/31/17   Medications administered today: Kendra Nelson had no medications administered during this visit.  Procedure Orders    No procedure(s) ordered today    Lab Orders     ToxASSURE Select 13 (MW), Urine Imaging Orders  No imaging studies ordered today   Referral Orders  No referral(s) requested today    Interventional management options: Planned, scheduled, and/or pending:   Intrathecal pump refill as per pump programming.   Considering:   Diagnostic left intra-articular knee joint aspiration and injection of local anesthetic and steroid  Diagnostic bilateral L2-3 transforaminal epidural steroid injection  Diagnostic left genicular nerve block  Possible left genicular nerve RFA  Palliative/therapeutic intrathecal pump analysis and refill  Diagnostic caudal epidural steroid injection + diagnostic epidurogram  Possible Racz procedure  Diagnostic bilateral lumbar facet block  Possible bilateral lumbar facet RFA  (unlikely due to extensive lumbar fusion from L3-S1)    Palliative PRN treatment(s):   None at this time   Provider-requested follow-up: Return for Pump Refill (as per pump program), PRN Procedure.  Future Appointments  Date Time Provider [place]  12/14/2017 11:00 AM Milinda Pointer, MD [facility] None   Primary Care Physician: Bernerd Limbo, MD Location: [facility] Facility Note by: Gaspar Cola, MD Date: 09/29/2017; Time: 1:25 PM

## 2017-10-05 LAB — TOXASSURE SELECT 13 (MW), URINE

## 2017-11-14 ENCOUNTER — Encounter: Payer: Self-pay | Admitting: Gastroenterology

## 2017-11-15 ENCOUNTER — Other Ambulatory Visit: Payer: Self-pay

## 2017-11-15 ENCOUNTER — Other Ambulatory Visit: Payer: Self-pay | Admitting: Orthopedic Surgery

## 2017-11-15 MED ORDER — PAIN MANAGEMENT IT PUMP REFILL: 1.0000 | Freq: Once | INTRATHECAL | 0 refills | Status: AC

## 2017-12-08 NOTE — Pre-Procedure Instructions (Signed)
Kendra Nelson  12/08/2017      [id number] - [place], [place] - [address] AT [facility] [address] [place] [address] Phone: [phone] Fax: [phone]    Your procedure is scheduled on Monday, April 22nd    Report to [facility] Admitting at 10:16AM             (posted surgery time 12:16p - 2:12p)   Call this number if you have problems the morning of surgery:  [phone]   Remember:              4-5 days prior to surgery, STOP TAKING any Vitamins, Herbal Supplements, Anti-inflammatories, Blood thinners.   Do not eat food or drink liquids after midnight, Sunday.   Take these medicines the morning of surgery with A SIP OF WATER : Inderal   Do not wear jewelry, make-up or nail polish.  Do not wear lotions, powders, or perfumes, or deodorant.  Do not shave 48 hours prior to surgery.  Do not bring valuables to the hospital.  [facility] is not responsible for any belongings or valuables.  Contacts, dentures or bridgework may not be worn into surgery.  Leave your suitcase in the car.  After surgery it may be brought to your room.  For patients admitted to the hospital, discharge time will be determined by your treatment team.  Please read over the following fact sheets that you were given. Pain Booklet, MRSA Information and Surgical Site Infection Prevention       [place]- Preparing For Surgery  Before surgery, you can play an important role. Because skin is not sterile, your skin needs to be as free of germs as possible. You can reduce the number of germs on your skin by washing with CHG (chlorahexidine gluconate) Soap before surgery.  CHG is an antiseptic cleaner which kills germs and bonds with the skin to continue killing germs even after washing.  Please do not use if you have an allergy to CHG or antibacterial soaps. If your skin becomes reddened/irritated stop using the CHG.  Do not shave  (including legs and underarms) for at least 48 hours prior to first CHG shower. It is OK to shave your face.  Please follow these instructions carefully.   1. Shower the NIGHT BEFORE SURGERY and the MORNING OF SURGERY with CHG.   2. If you chose to wash your hair, wash your hair first as usual with your normal shampoo.  3. After you shampoo, rinse your hair and body thoroughly to remove the shampoo.  4. Use CHG as you would any other liquid soap. You can apply CHG directly to the skin and wash gently with a scrungie or a clean washcloth.   5. Apply the CHG Soap to your body ONLY FROM THE NECK DOWN.  Do not use on open wounds or open sores. Avoid contact with your eyes, ears, mouth and genitals (private parts). Wash Face and genitals (private parts)  with your normal soap.  6. Wash thoroughly, paying special attention to the area where your surgery will be performed.  7. Thoroughly rinse your body with warm water from the neck down.  8. DO NOT shower/wash with your normal soap after using and rinsing off the CHG Soap.  9. Pat yourself dry with a CLEAN TOWEL.  10. Wear CLEAN PAJAMAS to bed the night before surgery, wear comfortable clothes the morning of surgery  11. Place CLEAN SHEETS on your bed the night of your first shower and DO NOT SLEEP WITH PETS.    [place] of Surgery: Do not apply any deodorants/lotions. Please wear clean clothes to the hospital/surgery center.

## 2017-12-08 NOTE — Progress Notes (Addendum)
PCP: [facility]  Cardiologist: pt denies  EKG: obtained today  Stress test: pt denies  ECHO: pt denies ever  Cardiac Cath: pt denies ever  Chest x-ray:obtained today

## 2017-12-08 NOTE — Pre-Procedure Instructions (Signed)
KHILYNN BORNTREGER  12/08/2017      [facility] - [place], [place] - [address] AT [facility] [address] [place] [address] Phone: [phone] Fax: [phone]    Your procedure is scheduled on December 20, 2017.  Report to [facility] Admitting at [phone] AM.  Call this number if you have problems the morning of surgery:  [phone]   Remember:  Do not eat food or drink liquids after midnight.  Take these medicines the morning of surgery with A SIP OF WATER propranolol (inderal), pain pill-if needed  7 days prior to surgery STOP taking any meloxicam (mobic), Aspirin (unless otherwise instructed by your surgeon), Aleve, Naproxen, Ibuprofen, Motrin, Advil, Goody's, BC's, all herbal medications, fish oil, and all vitamins  Continue all other medications as instructed by your physician except follow the above medication instructions before surgery   Contacts, dentures or bridgework may not be worn into surgery.  Leave your suitcase in the car.  After surgery it may be brought to your room.  For patients admitted to the hospital, discharge time will be determined by your treatment team.  Patients discharged the day of surgery will not be allowed to drive home.   [place]- Preparing For Surgery  Before surgery, you can play an important role. Because skin is not sterile, your skin needs to be as free of germs as possible. You can reduce the number of germs on your skin by washing with CHG (chlorahexidine gluconate) Soap before surgery.  CHG is an antiseptic cleaner which kills germs and bonds with the skin to continue killing germs even after washing.  Please do not use if you have an allergy to CHG or antibacterial soaps. If your skin becomes reddened/irritated stop using the CHG.  Do not shave (including legs and underarms) for at least 48 hours prior to first CHG shower. It is OK to shave your face.  Please follow these  instructions carefully.   1. Shower the NIGHT BEFORE SURGERY and the MORNING OF SURGERY with CHG.   2. If you chose to wash your hair, wash your hair first as usual with your normal shampoo.  3. After you shampoo, rinse your hair and body thoroughly to remove the shampoo.  4. Use CHG as you would any other liquid soap. You can apply CHG directly to the skin and wash gently with a scrungie or a clean washcloth.   5. Apply the CHG Soap to your body ONLY FROM THE NECK DOWN.  Do not use on open wounds or open sores. Avoid contact with your eyes, ears, mouth and genitals (private parts). Wash Face and genitals (private parts)  with your normal soap.  6. Wash thoroughly, paying special attention to the area where your surgery will be performed.  7. Thoroughly rinse your body with warm water from the neck down.  8. DO NOT shower/wash with your normal soap after using and rinsing off the CHG Soap.  9. Pat yourself dry with a CLEAN TOWEL.  10. Wear CLEAN PAJAMAS to bed the night before surgery, wear comfortable clothes the morning of surgery  11. Place CLEAN SHEETS on your bed the night of your first shower and DO NOT SLEEP WITH PETS.   Day of Surgery: Do not apply any deodorants/lotions. Please wear clean clothes to the hospital/surgery center.                Do  not wear jewelry, make-up or nail polish.  Do not wear lotions, powders, or perfumes, or deodorant.  Do not shave 48 hours prior to surgery.  Men may shave face and neck.  Do not bring valuables to the hospital.  [facility] is not responsible for any belongings or valuables.  Please read over the following fact sheets that you were given. Pain Booklet, Coughing and Deep Breathing, MRSA Information and Surgical Site Infection Prevention

## 2017-12-09 ENCOUNTER — Other Ambulatory Visit: Payer: Self-pay

## 2017-12-09 ENCOUNTER — Ambulatory Visit (HOSPITAL_COMMUNITY)
Admission: RE | Admit: 2017-12-09 | Discharge: 2017-12-09 | Disposition: A | Discharge status: Home / Self Care | Payer: Medicare Other | Source: Ambulatory Visit | Attending: Orthopedic Surgery | Admitting: Orthopedic Surgery

## 2017-12-09 ENCOUNTER — Encounter (HOSPITAL_COMMUNITY): Payer: Self-pay

## 2017-12-09 ENCOUNTER — Encounter (HOSPITAL_COMMUNITY)
Admission: RE | Admit: 2017-12-09 | Discharge: 2017-12-09 | Disposition: A | Discharge status: Home / Self Care | Payer: Medicare Other | Source: Ambulatory Visit | Attending: Orthopedic Surgery | Admitting: Orthopedic Surgery

## 2017-12-09 DIAGNOSIS — Z01818 Encounter for other preprocedural examination: Secondary | ICD-10-CM | POA: Diagnosis not present

## 2017-12-09 DIAGNOSIS — Z01812 Encounter for preprocedural laboratory examination: Secondary | ICD-10-CM | POA: Diagnosis not present

## 2017-12-09 HISTORY — DX: Essential (primary) hypertension: I10

## 2017-12-09 LAB — CBC WITH DIFFERENTIAL/PLATELET
Basophils Absolute: 0 10*3/uL (ref 0.0–0.1)
Basophils Relative: 0 %
Eosinophils Absolute: 0.3 10*3/uL (ref 0.0–0.7)
Eosinophils Relative: 3 %
HCT: 43.8 % (ref 36.0–46.0)
HEMOGLOBIN: 14.5 g/dL (ref 12.0–15.0)
LYMPHS ABS: 2.8 10*3/uL (ref 0.7–4.0)
LYMPHS PCT: 28 %
MCH: 30.4 pg (ref 26.0–34.0)
MCHC: 33.1 g/dL (ref 30.0–36.0)
MCV: 91.8 fL (ref 78.0–100.0)
Monocytes Absolute: 0.5 10*3/uL (ref 0.1–1.0)
Monocytes Relative: 5 %
NEUTROS PCT: 64 %
Neutro Abs: 6.5 10*3/uL (ref 1.7–7.7)
Platelets: 242 10*3/uL (ref 150–400)
RBC: 4.77 MIL/uL (ref 3.87–5.11)
RDW: 14.3 % (ref 11.5–15.5)
WBC: 10.1 10*3/uL (ref 4.0–10.5)

## 2017-12-09 LAB — URINALYSIS, ROUTINE W REFLEX MICROSCOPIC
Bacteria, UA: NONE SEEN
Bilirubin Urine: NEGATIVE
Glucose, UA: NEGATIVE mg/dL
Hgb urine dipstick: NEGATIVE
Ketones, ur: 5 mg/dL — AB
Leukocytes, UA: NEGATIVE
Nitrite: NEGATIVE
Protein, ur: NEGATIVE mg/dL
SPECIFIC GRAVITY, URINE: 1.025 (ref 1.005–1.030)
WBC, UA: NONE SEEN WBC/hpf (ref 0–5)
pH: 5 (ref 5.0–8.0)

## 2017-12-09 LAB — BASIC METABOLIC PANEL
Anion gap: 11 (ref 5–15)
BUN: 17 mg/dL (ref 6–20)
CHLORIDE: 103 mmol/L (ref 101–111)
CO2: 24 mmol/L (ref 22–32)
Calcium: 9.1 mg/dL (ref 8.9–10.3)
Creatinine, Ser: 0.8 mg/dL (ref 0.44–1.00)
GFR calc Af Amer: >60 mL/min (ref >60)
GFR calc non Af Amer: >60 mL/min (ref >60)
Glucose, Bld: 104 mg/dL — ABNORMAL HIGH (ref 65–99)
Potassium: 4.1 mmol/L (ref 3.5–5.1)
SODIUM: 138 mmol/L (ref 135–145)

## 2017-12-09 LAB — APTT: aPTT: 29 seconds (ref 24–36)

## 2017-12-09 LAB — SURGICAL PCR SCREEN
MRSA, PCR: NEGATIVE
Staphylococcus aureus: NEGATIVE

## 2017-12-09 LAB — PROTIME-INR
INR: 1.01
Prothrombin Time: 13.2 seconds (ref 11.4–15.2)

## 2017-12-13 ENCOUNTER — Other Ambulatory Visit: Payer: Self-pay | Admitting: Orthopedic Surgery

## 2017-12-13 NOTE — Care Plan (Signed)
I have spoke with patient prior to surgery and she plans to discharge to home with her family and HHPT provided by [facility] at home.She will have 5 PT visits prior to her follow up appointment with Dr. Turner Daniels on 01/04/18 @ 930. At this point she will transition to OPPT at [facility] at 1040. She is agreeable with the above plan.   Please contact Shauna Hugh, RNCM [phone] with questions or if this plan should need to change.    Thank you

## 2017-12-13 NOTE — Progress Notes (Signed)
Patient's Name: Kendra Nelson  MRN: [id number]  Referring Provider: Bernerd Limbo, MD  DOB: [birth year]  PCP: System, Pcp Not In  DOS: 12/14/2017  Note by: Gaspar Cola, MD  Service setting: Ambulatory outpatient  Specialty: Interventional Pain Management  Patient type: Established  Location: [facility] (AMB) Pain Management Facility  Visit type: Interventional Procedure   Primary Reason for Visit: Interventional Pain Management Treatment. CC: Back Pain (low) and Knee Pain (left)  Procedure:  Intrathecal Drug Delivery System (IDDS):  Type: Reservoir Refill [phone]) No rate change Region: Abdominal Laterality: Left  Type of Pump: Medtronic Synchromed II (MRI-compatible) Delivery Route: Intrathecal Type of Pain Treated: Neuropathic/Nociceptive Primary Medication Class: Opioid/opiate  Medication, Concentration, Infusion Program, & Delivery Rate: Please see scanned programming printout.   Indications: 1. 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 Chronic lumbosacral radiculopathy (L5) (Left)   3. Chronic lower extremity pain (Secondary Area of Pain) (Bilateral) (R>L)   4. Chronic low back pain (Primary Area of Pain) (Bilateral) (R>L)   5. Presence of intrathecal pump   6. Adjustment and management of infusion pump   7. Chronic pain syndrome    Pain Assessment: Self-Reported Pain Score: 3 /10             Reported level is compatible with observation.        Intrathecal Pump Therapy Assessment  Manufacturer: Medtronic Synchromed II Type: Programmable Volume: 40 mL reservoir MRI compatibility: Yes   Drug content:  Primary Medication Class: Opioid Primary Medication: PF-Fentanyl Secondary Medication: PF-Bupivacaine Other Medication: None   Programming:  Type: Simple continuous. See pump readout for details.   Changes:  Medication Change: None at this point Rate Change: No change in rate  Reported side-effects or adverse reactions: None  reported  Effectiveness: Described as relatively effective, allowing for increase in activities of daily living (ADL) Clinically meaningful improvement in function (CMIF): Sustained CMIF goals met  Plan: Pump refill today  Pre-op Assessment:  Ms. Oborn is a 57 y.o. (year old), female patient, seen today for interventional treatment. She  has a past surgical history that includes Spinal cord stimulator insertion; Back surgery; Pain pump implantation; Abdominal hysterectomy; Pain pump implantation (05/10/2012); Colonoscopy; and Spinal cord stimulator battery exchange (N/A, 06/09/2016). Ms. Wissinger has a current medication list which includes the following prescription(s): acitretin, aspirin ec, atorvastatin, cyclobenzaprine, lisinopril, meloxicam, metoprolol tartrate, oxycodone hcl, oxycodone hcl, oxycodone hcl, ropinirole, escitalopram, multivitamin with minerals, and probiotic product. Her primarily concern today is the Back Pain (low) and Knee Pain (left)  Initial Vital Signs:  Pulse Rate: 65 Temp: 97.9 F (36.6 C) Resp: 18 BP: (!) 145/74 SpO2: 99 %  BMI: Estimated body mass index is 25.69 kg/m as calculated from the following:   Height as of this encounter: _0  (1.6 m).   Weight as of this encounter: 145 lb (65.8 kg).  Risk Assessment: Allergies: Reviewed. She is allergic to benadryl [diphenhydramine hcl] and bee venom.  Allergy Precautions: None required Coagulopathies: Reviewed. None identified.  Blood-thinner therapy: None at this time Active Infection(s): Reviewed. None identified. Ms. Molloy is afebrile  Site Confirmation: Ms. Oneil was asked to confirm the procedure and laterality before marking the site Procedure checklist: Completed Consent: Before the procedure and under the influence of no sedative(s), amnesic(s), or anxiolytics, the patient was informed of the treatment options, risks and possible complications. To fulfill our ethical and legal obligations,  as recommended by the American Medical Association's Code of Ethics, I have  informed the patient of my clinical impression; the nature and purpose of the treatment or procedure; the risks, benefits, and possible complications of the intervention; the alternatives, including doing nothing; the risk(s) and benefit(s) of the alternative treatment(s) or procedure(s); and the risk(s) and benefit(s) of doing nothing.  Ms. Raspberry was provided with information about the general risks and possible complications associated with most interventional procedures. These include, but are not limited to: failure to achieve desired goals, infection, bleeding, organ or nerve damage, allergic reactions, paralysis, and/or death.  In addition, she was informed of those risks and possible complications associated to this particular procedure, which include, but are not limited to: damage to the implant; failure to decrease pain; local, systemic, or serious CNS infections, intraspinal abscess with possible cord compression and paralysis, or life-threatening such as meningitis; bleeding; organ damage; nerve injury or damage with subsequent sensory, motor, and/or autonomic system dysfunction, resulting in transient or permanent pain, numbness, and/or weakness of one or several areas of the body; allergic reactions, either minor or major life-threatening, such as anaphylactic or anaphylactoid reactions.  Furthermore, Ms. Connole was informed of those risks and complications associated with the medications. These include, but are not limited to: allergic reactions (i.e.: anaphylactic or anaphylactoid reactions); endorphine suppression; bradycardia and/or hypotension; water retention and/or peripheral vascular relaxation leading to lower extremity edema and possible stasis ulcers; respiratory depression and/or shortness of breath; decreased metabolic rate leading to weight gain; swelling or edema; medication-induced neural toxicity;  particulate matter embolism and blood vessel occlusion with resultant organ, and/or nervous system infarction; and/or intrathecal granuloma formation with possible spinal cord compression and permanent paralysis.  Before refilling the pump Ms. Pospisil was informed that some of the medications used in the devise may not be FDA approved for such use and therefore it constitutes an off-label use of the medications.  Finally, she was informed that Medicine is not an exact science; therefore, there is also the possibility of unforeseen or unpredictable risks and/or possible complications that may result in a catastrophic outcome. The patient indicated having understood very clearly. We have given the patient no guarantees and we have made no promises. Enough time was given to the patient to ask questions, all of which were answered to the patient's satisfaction. Ms. Bebeau has indicated that she wanted to continue with the procedure. Attestation: I, the ordering provider, attest that I have discussed with the patient the benefits, risks, side-effects, alternatives, likelihood of achieving goals, and potential problems during recovery for the procedure that I have provided informed consent. Date  Time: 12/14/2017 11:10 AM  Pre-Procedure Preparation:  Monitoring: As per clinic protocol. Respiration, ETCO2, SpO2, BP, heart rate and rhythm monitor placed and checked for adequate function Safety Precautions: Patient was assessed for positional comfort and pressure points before starting the procedure. Time-out: I initiated and conducted the "Time-out" before starting the procedure, as per protocol. The patient was asked to participate by confirming the accuracy of the "Time Out" information. Verification of the correct person, site, and procedure were performed and confirmed by me, the nursing staff, and the patient. "Time-out" conducted as per Joint Commission's Universal Protocol (UP.01.01.01). Time:  1126  Description of Procedure Process:   Position: Supine Target Area: Central-port of intrathecal pump. Approach: Anterior, 90 degree angle approach. Area Prepped: Entire Area around the pump implant. Prepping solution: ChloraPrep (2% chlorhexidine gluconate and 70% isopropyl alcohol) Safety Precautions: Aspiration looking for blood return was conducted prior to all injections. At no point did we  inject any substances, as a needle was being advanced. No attempts were made at seeking any paresthesias. Safe injection practices and needle disposal techniques used. Medications properly checked for expiration dates. SDV (single dose vial) medications used. Description of the Procedure: Protocol guidelines were followed. Two nurses trained to do implant refills were present during the entire procedure. The refill medication was checked by both healthcare providers as well as the patient. The patient was included in the "Time-out" to verify the medication. The patient was placed in position. The pump was identified. The area was prepped in the usual manner. The sterile template was positioned over the pump, making sure the side-port location matched that of the pump. Both, the pump and the template were held for stability. The needle provided in the Medtronic Kit was then introduced thru the center of the template and into the central port. The pump content was aspirated and discarded volume documented. The new medication was slowly infused into the pump, thru the filter, making sure to avoid overpressure of the device. The needle was then removed and the area cleansed, making sure to leave some of the prepping solution back to take advantage of its long term bactericidal properties. The pump was interrogated and programmed to reflect the correct medication, volume, and dosage. The program was printed and taken to the physician for approval. Once checked and signed by the physician, a copy was provided to the  patient and another scanned into the EMR. Vitals:   12/14/17 1109  BP: (!) 145/74  Pulse: 65  Resp: 18  Temp: 97.9 F (36.6 C)  TempSrc: Oral  SpO2: 99%  Weight: 145 lb (65.8 kg)  Height: _0  (1.6 m)    Start Time: 1126 hrs. End Time:   hrs. Materials & Medications: Medtronic Refill Kit Medication(s): Please see chart orders for details.  Imaging Guidance:  Type of Imaging Technique: None used Indication(s): N/A Exposure Time: No patient exposure Contrast: None used. Fluoroscopic Guidance: N/A Ultrasound Guidance: N/A Interpretation: N/A  Antibiotic Prophylaxis:   Anti-infectives (From admission, onward)   None     Indication(s): None identified  Post-operative Assessment:  Post-procedure Vital Signs:  Pulse Rate: 65 Temp: 97.9 F (36.6 C) Resp: 18 BP: (!) 145/74 SpO2: 99 %  EBL: None  Complications: No immediate post-treatment complications observed by team, or reported by patient.  Note: The patient tolerated the entire procedure well. A repeat set of vitals were taken after the procedure and the patient was kept under observation following institutional policy, for this type of procedure. Post-procedural neurological assessment was performed, showing return to baseline, prior to discharge. The patient was provided with post-procedure discharge instructions, including a section on how to identify potential problems. Should any problems arise concerning this procedure, the patient was given instructions to immediately contact us, at any time, without hesitation. In any case, we plan to contact the patient by telephone for a follow-up status report regarding this interventional procedure.  Comments:  No additional relevant information.  Controlled Substance Pharmacotherapy Assessment REMS (Risk Evaluation and Mitigation Strategy)  Analgesic: Oxycodone IR 10 mg 5x/day (86m/dayof oxycodone) (75MME/day) (04/18/2017) + intrathecal pump medication (fentanyl)  (825 MME/day) (915MME/day) Highest recorded MME/day: (915MME/day) MME/day: (900MME/day)   SHart Rochester RN  12/14/2017 11:30 AM  Sign at close encounter Nursing Pain Medication Assessment:  Safety precautions to be maintained throughout the outpatient stay will include: orient to surroundings, keep bed in low position, maintain call bell within reach at all times, provide  assistance with transfer out of bed and ambulation.  Medication Inspection Compliance: Pill count conducted under aseptic conditions, in front of the patient. Neither the pills nor the bottle was removed from the patient's sight at any time. Once count was completed pills were immediately returned to the patient in their original bottle.  Medication: Oxycodone IR Pill/Patch Count: 75 of 120 pills remain Pill/Patch Appearance: Markings consistent with prescribed medication Bottle Appearance: Standard pharmacy container. Clearly labeled. Filled Date: 04 / 02 / 2019 Last Medication intake:  Today   Pharmacokinetics: Liberation and absorption (onset of action): WNL Distribution (time to peak effect): WNL Metabolism and excretion (duration of action): WNL         Pharmacodynamics: Desired effects: Analgesia: Ms. Mullings reports >50% benefit. Functional ability: Patient reports that medication allows her to accomplish basic ADLs Clinically meaningful improvement in function (CMIF): Sustained CMIF goals met Perceived effectiveness: Described as relatively effective, allowing for increase in activities of daily living (ADL) Undesirable effects: Side-effects or Adverse reactions: None reported Monitoring: [place] PMP: Online review of the past 69-monthperiod conducted. Compliant with practice rules and regulations Last UDS on record: Summary  Date Value Ref Range Status  09/29/2017 FINAL  Final    Comment:    ==================================================================== TOXASSURE SELECT 13  (MW) ==================================================================== Test                             Result       Flag       Units Drug Present and Declared for Prescription Verification   Oxymorphone                    66           EXPECTED   ng/mg creat   Noroxycodone                   65           EXPECTED   ng/mg creat    Oxymorphone and noroxycodone are expected metabolites of    oxycodone. Sources of oxycodone are scheduled prescription    medications. Oxymorphone is also available as a scheduled    prescription medication. Drug Present not Declared for Prescription Verification   Fentanyl                       24           UNEXPECTED ng/mg creat   Norfentanyl                    73           UNEXPECTED ng/mg creat    Source of fentanyl is a scheduled prescription medication,    including IV, patch, and transmucosal formulations. Norfentanyl    is an expected metabolite of fentanyl. Drug Absent but Declared for Prescription Verification   Oxycodone                      Not Detected UNEXPECTED ng/mg creat    Oxycodone is almost always present in patients taking this drug    consistently.  Absence of oxycodone could be due to lapse of time    since the last dose or unusual pharmacokinetics (rapid    metabolism). ==================================================================== Test                      Result  Flag   Units      Ref Range   Creatinine              119              mg/dL      >=20 ==================================================================== Declared Medications:  The flagging and interpretation on this report are based on the  following declared medications.  Unexpected results may arise from  inaccuracies in the declared medications.  **Note: The testing scope of this panel includes these medications:  Oxycodone  **Note: The testing scope of this panel does not include following  reported medications:  Acitretin  Aspirin  Atorvastatin   Cyclobenzaprine  Lisinopril  Meloxicam  Multivitamin  Propranolol ==================================================================== For clinical consultation, please call [phone]. ====================================================================    UDS interpretation: Compliant Absence of the parent compound in the presence of its metabolites could be due to lapse of time since the last dose or unusual pharmacokinetics (Rapid Metabolism). Medication Assessment Form: Reviewed. Patient indicates being compliant with therapy Treatment compliance: Compliant Risk Assessment Profile: Aberrant behavior: See prior evaluations. None observed or detected today Comorbid factors increasing risk of overdose: See prior notes. No additional risks detected today Risk of substance use disorder (SUD): Low Opioid Risk Tool - 12/14/17 1125      Family History of Substance Abuse   Alcohol  Negative    Illegal Drugs  Negative    Rx Drugs  Negative      Personal History of Substance Abuse   Alcohol  Negative    Illegal Drugs  Negative    Rx Drugs  Negative      Age   Age between 62-45 years   No      History of Preadolescent Sexual Abuse   History of Preadolescent Sexual Abuse  Negative or Female      Psychological Disease   Psychological Disease  Negative    Depression  Positive      Total Score   Opioid Risk Tool Scoring  1    Opioid Risk Interpretation  Low Risk      ORT Scoring interpretation table:  Score <3 = Low Risk for SUD  Score between 4-7 = Moderate Risk for SUD  Score >8 = High Risk for Opioid Abuse   Risk Mitigation Strategies:  Patient Counseling: Covered Patient-Prescriber Agreement (PPA): Present and active  Notification to other healthcare providers: Done  Pharmacologic Plan: No change in therapy, at this time.             Assessment  Primary Diagnosis & Pertinent Problem List: The primary encounter diagnosis was Failed back surgical syndrome (Lumbar  interbody fusion from L3-S1). Diagnoses of Chronic lumbosacral radiculopathy (L5) (Left), Chronic lower extremity pain (Secondary Area of Pain) (Bilateral) (R>L), Chronic low back pain (Primary Area of Pain) (Bilateral) (R>L), Presence of intrathecal pump, Adjustment and management of infusion pump, and Chronic pain syndrome were also pertinent to this visit.  Status Diagnosis  Controlled Controlled Controlled 1. 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 Chronic lumbosacral radiculopathy (L5) (Left)   3. Chronic lower extremity pain (Secondary Area of Pain) (Bilateral) (R>L)   4. Chronic low back pain (Primary Area of Pain) (Bilateral) (R>L)   5. Presence of intrathecal pump   6. Adjustment and management of infusion pump   7. Chronic pain syndrome     Problems updated and reviewed during this visit: No problems updated. Plan of Care   Possible POC:  The patient is scheduled to undergo a total knee replacement.  Today we have provided the patient with our handout to assist the surgeon on the postoperative management of this patient's acute pain.  Today the patient had a couple questions regarding the Racz procedure.  She indicated that she may be interested in having this done in the future.   Imaging Orders  No imaging studies ordered today    Procedure Orders     PUMP REFILL     PUMP REFILL  Medications ordered for procedure: Meds ordered this encounter  Medications  . Oxycodone HCl 10 MG TABS    Sig: Take 1 tablet (10 mg total) by mouth every 6 (six) hours as needed. Max: 4/day    Dispense:  120 tablet    Refill:  0    Patient may have prescription filled one day early if pharmacy is closed on scheduled refill date. [place] STOP ACT not applicable to this medication for Chronic Pain (G89.4) Do not fill until: 03/01/18 To last until: 03/31/18  . Oxycodone HCl 10 MG TABS    Sig: Take 1 tablet (10 mg total) by mouth every 6 (six) hours as needed. Max:  4/day    Dispense:  120 tablet    Refill:  0    Patient may have prescription filled one day early if pharmacy is closed on scheduled refill date. [place] STOP ACT not applicable to this medication for Chronic Pain (G89.4) Do not fill until: 01/30/18 To last until: 03/01/18  . Oxycodone HCl 10 MG TABS    Sig: Take 1 tablet (10 mg total) by mouth every 6 (six) hours as needed. Max: 4/day    Dispense:  120 tablet    Refill:  0    Patient may have prescription filled one day early if pharmacy is closed on scheduled refill date. [place] STOP ACT not applicable to this medication for Chronic Pain (G89.4) Do not fill until: 12/30/17 To last until: 01/30/18   Medications administered: Hassan Rowan A. Barcenas had no medications administered during this visit.  See the medical record for exact dosing, route, and time of administration.  New Prescriptions   No medications on file   Disposition: Discharge home  Discharge Date & Time: 12/14/2017; 1200 hrs.   Physician-requested Follow-up: Return for Pump Refill (as per pump program) (Max:84mo, w/ CDionisio David NP.  Future Appointments  Date Time Provider [place] 03/16/2018 11:15 AM [facility]   Primary Care Physician: System, Pcp Not In [facility] Note by: FGaspar Cola MD Date: 12/14/2017; Time: 12:08 PM  Disclaimer:  Medicine is not an eChief Strategy Officer The only guarantee in medicine is that nothing is guaranteed. It is important to note that the decision to proceed with this intervention was based on the information collected from the patient. The Data and conclusions were drawn from the patient's questionnaire, the interview, and the physical examination. Because the information was provided in large part by the patient, it cannot be guaranteed that it has not been purposely or unconsciously manipulated. Every effort has been made to obtain as much relevant data as possible for  this evaluation. It is important to note that the conclusions that lead to this procedure are derived in large part from the available data. Always take into account that the treatment will also be dependent on availability of resources and existing treatment guidelines, considered by other Pain Management Practitioners as being common knowledge and  practice, at the time of the intervention. For Medico-Legal purposes, it is also important to point out that variation in procedural techniques and pharmacological choices are the acceptable norm. The indications, contraindications, technique, and results of the above procedure should only be interpreted and judged by a Board-Certified Interventional Pain Specialist with extensive familiarity and expertise in the same exact procedure and technique.

## 2017-12-14 ENCOUNTER — Other Ambulatory Visit: Payer: Self-pay

## 2017-12-14 ENCOUNTER — Ambulatory Visit: Payer: Medicare Other | Attending: Pain Medicine | Admitting: Pain Medicine

## 2017-12-14 ENCOUNTER — Encounter: Payer: Self-pay | Admitting: Pain Medicine

## 2017-12-14 VITALS — BP 145/74 | HR 65 | Temp 97.9°F | Resp 18 | Ht 63.0 in | Wt 145.0 lb

## 2017-12-14 DIAGNOSIS — G894 Chronic pain syndrome: Secondary | ICD-10-CM

## 2017-12-14 DIAGNOSIS — Z978 Presence of other specified devices: Secondary | ICD-10-CM

## 2017-12-14 DIAGNOSIS — M79604 Pain in right leg: Secondary | ICD-10-CM | POA: Insufficient documentation

## 2017-12-14 DIAGNOSIS — F329 Major depressive disorder, single episode, unspecified: Secondary | ICD-10-CM | POA: Diagnosis not present

## 2017-12-14 DIAGNOSIS — M5441 Lumbago with sciatica, right side: Secondary | ICD-10-CM

## 2017-12-14 DIAGNOSIS — M545 Low back pain: Secondary | ICD-10-CM | POA: Insufficient documentation

## 2017-12-14 DIAGNOSIS — M25562 Pain in left knee: Secondary | ICD-10-CM | POA: Diagnosis present

## 2017-12-14 DIAGNOSIS — M79605 Pain in left leg: Secondary | ICD-10-CM | POA: Diagnosis not present

## 2017-12-14 DIAGNOSIS — Z7982 Long term (current) use of aspirin: Secondary | ICD-10-CM | POA: Diagnosis not present

## 2017-12-14 DIAGNOSIS — Z79899 Other long term (current) drug therapy: Secondary | ICD-10-CM | POA: Insufficient documentation

## 2017-12-14 DIAGNOSIS — M961 Postlaminectomy syndrome, not elsewhere classified: Secondary | ICD-10-CM

## 2017-12-14 DIAGNOSIS — M5417 Radiculopathy, lumbosacral region: Secondary | ICD-10-CM | POA: Diagnosis not present

## 2017-12-14 DIAGNOSIS — M5442 Lumbago with sciatica, left side: Secondary | ICD-10-CM

## 2017-12-14 DIAGNOSIS — Z451 Encounter for adjustment and management of infusion pump: Secondary | ICD-10-CM

## 2017-12-14 DIAGNOSIS — G8929 Other chronic pain: Secondary | ICD-10-CM

## 2017-12-14 DIAGNOSIS — Z981 Arthrodesis status: Secondary | ICD-10-CM | POA: Insufficient documentation

## 2017-12-14 MED ORDER — OXYCODONE HCL 10 MG PO TABS
10.0000 mg | ORAL_TABLET | Freq: Four times a day (QID) | ORAL | 0 refills | Status: DC | PRN
Start: 2018-03-01 — End: 2017-12-22

## 2017-12-14 MED ORDER — OXYCODONE HCL 10 MG PO TABS: 10.0000 mg | ORAL_TABLET | Freq: Four times a day (QID) | ORAL | 0 refills | Status: DC | PRN

## 2017-12-14 NOTE — Patient Instructions (Signed)
You were given 2 prescriptions for Oxycodone today. 

## 2017-12-14 NOTE — Progress Notes (Signed)
Nursing Pain Medication Assessment:  Safety precautions to be maintained throughout the outpatient stay will include: orient to surroundings, keep bed in low position, maintain call bell within reach at all times, provide assistance with transfer out of bed and ambulation.  Medication Inspection Compliance: Pill count conducted under aseptic conditions, in front of the patient. Neither the pills nor the bottle was removed from the patient's sight at any time. Once count was completed pills were immediately returned to the patient in their original bottle.  Medication: Oxycodone IR Pill/Patch Count: 75 of 120 pills remain Pill/Patch Appearance: Markings consistent with prescribed medication Bottle Appearance: Standard pharmacy container. Clearly labeled. Filled Date: 04 / 02 / 2019 Last Medication intake:  Today

## 2017-12-15 ENCOUNTER — Telehealth: Payer: Self-pay | Admitting: *Deleted

## 2017-12-15 NOTE — Telephone Encounter (Signed)
Attempted to call for post procedure follow-up. Message left. 

## 2017-12-16 DIAGNOSIS — M1712 Unilateral primary osteoarthritis, left knee: Secondary | ICD-10-CM | POA: Diagnosis present

## 2017-12-16 MED FILL — Medication: INTRATHECAL | Qty: 1 | Status: AC

## 2017-12-16 NOTE — H&P (Signed)
TOTAL KNEE ADMISSION H&P  Patient is being admitted for left total knee arthroplasty.  Subjective:  Chief Complaint:left knee pain.  HPI: Kendra Nelson, 57 y.o. female, has a history of pain and functional disability in the left knee due to arthritis and has failed non-surgical conservative treatments for greater than 12 weeks to includeNSAID's and/or analgesics, corticosteriod injections, use of assistive devices, weight reduction as appropriate and activity modification.  Onset of symptoms was gradual, starting 1 years ago with gradually worsening course since that time. The patient noted no past surgery on the left knee(s).  Patient currently rates pain in the left knee(s) at 10 out of 10 with activity. Patient has night pain, worsening of pain with activity and weight bearing, pain that interferes with activities of daily living, pain with passive range of motion, crepitus and joint swelling.  Patient has evidence of subchondral cysts, periarticular osteophytes and joint space narrowing by imaging studies.  There is no active infection.  Patient Active Problem List   Diagnosis Date Noted  . 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 Effusion of knee (Left) 09/29/2017  . DDD (degenerative disc disease), lumbar 09/29/2017  . Lumbar intervertebral disc protrusion (L2-3) 09/29/2017  . Restless legs syndrome 07/11/2017  . Lumbar facet syndrome (Bilateral) (R>L) 06/16/2017  . Drug-related hair loss 05/31/2017  . Screening for cervical cancer 05/30/2017  . Neurogenic pain 05/26/2017  . Neuropathic pain 05/26/2017  . Chronic musculoskeletal pain 05/26/2017  . Presence of intrathecal pump 05/25/2017  . Adjustment and management of infusion pump 05/25/2017  . Disorder of skeletal system 05/25/2017  . Chronic pain syndrome 05/25/2017  . Problems influencing health status 05/25/2017  . 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 Chronic low  back pain (Primary Area of Pain) (Bilateral) (R>L) 05/25/2017  . Chronic lower extremity pain (Secondary Area of Pain) (Bilateral) (R>L) 05/25/2017  . Long term prescription opiate use 05/25/2017  . Opiate use (915 MME/day) 05/25/2017  . Cigarette nicotine dependence without complication 11/24/2016  . Psoriasis 11/24/2016  . Need for influenza vaccination 08/26/2016  . 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  . Pure hypercholesterolemia 02/06/2016  . Need for hepatitis C screening test 02/03/2016  . Palpitations 02/03/2016  . Pharmacologic therapy 02/03/2016  . Chronic knee pain (Left) 01/01/2016  . Adjustment disorder with anxiety 04/02/2015  . Chronic lumbosacral radiculopathy (L5) (Left) 04/02/2015  . Primary insomnia 02/18/2015  . Chronic back pain greater than 3 months duration 11/12/2014  . Agoraphobia with panic disorder 05/23/2014  . Muscle spasm of back 09/11/2013  . Benign essential hypertension 11/06/2011  . Unspecified constipation 10/05/2007  . Abnormal weight gain 10/05/2007  . Opioid-induced constipation (OIC) 02/10/2007   Past Medical History:  Diagnosis Date  . Allergy   . Anxiety    off of valium- stress  . Arthritis    left knee- rhumatoid- hasnt seen specialist   . Complication of anesthesia    WOKE DURING BACK SURGERY   . Headache(784.0)    HX MIGRAINES  . Hyperlipidemia   . Hypertension   . Neuromuscular disorder (HCC)   . Skin abnormalities     Past Surgical History:  Procedure Laterality Date  . ABDOMINAL HYSTERECTOMY    . BACK SURGERY     8-9 SURGERIES  . COLONOSCOPY    . PAIN PUMP IMPLANTATION    . PAIN PUMP IMPLANTATION  05/10/2012   Procedure: PAIN PUMP INSERTION;  Surgeon: Maeola Harman, MD;  Location: [facility];  Service: Neurosurgery;  Laterality: N/A;  Pump replacement  . SPINAL CORD STIMULATOR BATTERY EXCHANGE N/A 06/09/2016   Procedure: IMPLANTABLE PULSE GENERATOR REPLACEMENT WITH RECHARGEABLE BATTERY;  Surgeon: Maeola Harman, MD;  Location: [facility]  OR;  Service: Neurosurgery;  Laterality: N/A;  . SPINAL CORD STIMULATOR INSERTION      No current facility-administered medications for this encounter.    Current Outpatient Medications  Medication Sig Dispense Refill Last Dose  . acitretin (SORIATANE) 25 MG capsule Take 1 capsule by mouth daily.    Taking  . aspirin EC 81 MG tablet Take 81 mg by mouth daily.   Taking  . atorvastatin (LIPITOR) 20 MG tablet Take 20 mg by mouth daily.    Taking  . cyclobenzaprine (FLEXERIL) 10 MG tablet Take 10 mg by mouth at bedtime.    Taking  . escitalopram (LEXAPRO) 10 MG tablet Take 5-10 mg by mouth See admin instructions. Take 5mg  for one week, then 10mg  once daily thereafter.   Not Taking  . lisinopril (PRINIVIL,ZESTRIL) 20 MG tablet Take 40 mg by mouth daily.    Taking  . meloxicam (MOBIC) 15 MG tablet Take 15 mg by mouth daily.    Taking  . metoprolol tartrate (LOPRESSOR) 50 MG tablet Take 50 mg by mouth daily.   Taking  . Multiple Vitamin (MULTIVITAMIN WITH MINERALS) TABS Take 2 tablets by mouth daily.    Not Taking  . Probiotic Product (PROBIOTIC-10 PO) Take 1 capsule by mouth at bedtime.   Not Taking  . rOPINIRole (REQUIP) 0.5 MG tablet Take 1 mg by mouth at bedtime as needed. Restless legs   Taking  . [START ON 03/01/2018] Oxycodone HCl 10 MG TABS Take 1 tablet (10 mg total) by mouth every 6 (six) hours as needed. Max: 4/day 120 tablet 0 Taking  . [START ON 01/30/2018] Oxycodone HCl 10 MG TABS Take 1 tablet (10 mg total) by mouth every 6 (six) hours as needed. Max: 4/day 120 tablet 0 Taking  . [START ON 12/30/2017] Oxycodone HCl 10 MG TABS Take 1 tablet (10 mg total) by mouth every 6 (six) hours as needed. Max: 4/day 120 tablet 0 Taking   Allergies  Allergen Reactions  . Benadryl [Diphenhydramine Hcl] Hives and Rash  . Bee Venom Other (See Comments)    UNSPECIFIED REACTION     Social History   Tobacco Use  . Smoking status: Current Every Day Smoker    Packs/day: 1.00    Years: 25.00    Pack  years: 25.00  . Smokeless tobacco: Never Used  Substance Use Topics  . Alcohol use: No    Family History  Problem Relation Age of Onset  . Aortic aneurysm Mother   . Diabetes Maternal Grandmother      Review of Systems  Constitutional: Negative.   HENT: Negative.   Eyes: Negative.   Respiratory: Negative.   Cardiovascular: Negative.   Gastrointestinal: Negative.   Genitourinary: Negative.   Musculoskeletal: Positive for joint pain and myalgias.  Skin: Negative.   Neurological: Negative.   Endo/Heme/Allergies: Negative.   Psychiatric/Behavioral: Negative.     Objective:  Physical Exam  Constitutional: She is oriented to person, place, and time. She appears well-developed and well-nourished.  HENT:  Head: Normocephalic and atraumatic.  Eyes: Pupils are equal, round, and reactive to light.  Neck: Normal range of motion. Neck supple.  Cardiovascular: Intact distal pulses.  Respiratory: Effort normal.  Musculoskeletal: She exhibits tenderness.   the patient's left knee has tenderness over the medial joint  line.  She also has mild to moderate pain over the pes bursar region.  No instability.  She also has mild posterior pain consistent with a Baker cyst.  Calves are soft and minimally tender.  She is neurovascularly intact distally.  Neurological: She is alert and oriented to person, place, and time.  Skin: Skin is warm and dry.  Psychiatric: She has a normal mood and affect. Her behavior is normal. Judgment and thought content normal.    Vital signs in last 24 hours:    Labs:   Estimated body mass index is 25.69 kg/m as calculated from the following:   Height as of 12/14/17: 5\' 3"  (1.6 m).   Weight as of 12/14/17: 65.8 kg (145 lb).   Imaging Review Plain radiographs demonstrate  bilateral AP weightbearing, bilateral Rosenberg, lateral sunrise views of the left knee are taken and reviewed in office today.  This shows near end-stage arthritis medial compartment with  periarticular osteophyte formation and subchondral sclerosis.   Preoperative templating of the joint replacement has been completed, documented, and submitted to the Operating Room personnel in order to optimize intra-operative equipment management.   Anticipated LOS equal to or greater than 2 midnights due to - Age 2 and older with one or more of the following:  - Obesity  - Expected need for hospital services (PT, OT, Nursing) required for safe  discharge  - Anticipated need for postoperative skilled nursing care or inpatient rehab  - Active co-morbidities: Chronic pain requiring opiods     Assessment/Plan:  End stage arthritis, left knee   The patient history, physical examination, clinical judgment of the provider and imaging studies are consistent with end stage degenerative joint disease of the left knee(s) and total knee arthroplasty is deemed medically necessary. The treatment options including medical management, injection therapy arthroscopy and arthroplasty were discussed at length. The risks and benefits of total knee arthroplasty were presented and reviewed. The risks due to aseptic loosening, infection, stiffness, patella tracking problems, thromboembolic complications and other imponderables were discussed. The patient acknowledged the explanation, agreed to proceed with the plan and consent was signed. Patient is being admitted for inpatient treatment for surgery, pain control, PT, OT, prophylactic antibiotics, VTE prophylaxis, progressive ambulation and ADL's and discharge planning. The patient is planning to be discharged home with home health services

## 2017-12-17 MED ORDER — TRANEXAMIC ACID 1000 MG/10ML IV SOLN
2000.0000 mg | INTRAVENOUS | Status: DC
  Filled 2017-12-17: qty 20

## 2017-12-17 MED ORDER — BUPIVACAINE LIPOSOME 1.3 % IJ SUSP
20.0000 mL | INTRAMUSCULAR | Status: DC
  Filled 2017-12-17: qty 20

## 2017-12-17 MED ORDER — TRANEXAMIC ACID 1000 MG/10ML IV SOLN
1000.0000 mg | INTRAVENOUS | Status: AC
  Administered 2017-12-20: 1000 mg via INTRAVENOUS
  Filled 2017-12-17: qty 1100

## 2017-12-20 ENCOUNTER — Other Ambulatory Visit: Payer: Self-pay

## 2017-12-20 ENCOUNTER — Inpatient Hospital Stay (HOSPITAL_COMMUNITY): Payer: Medicare Other | Admitting: Certified Registered Nurse Anesthetist

## 2017-12-20 ENCOUNTER — Encounter (HOSPITAL_COMMUNITY): Payer: Self-pay | Admitting: *Deleted

## 2017-12-20 ENCOUNTER — Encounter (HOSPITAL_COMMUNITY)
Admission: RE | Disposition: A | Discharge status: Home Health | Payer: Self-pay | Source: Ambulatory Visit | Attending: Orthopedic Surgery

## 2017-12-20 ENCOUNTER — Inpatient Hospital Stay (HOSPITAL_COMMUNITY)
Admission: RE | Admit: 2017-12-20 | Discharge: 2017-12-22 | DRG: 470 | Disposition: A | Discharge status: Home Health | Payer: Medicare Other | Source: Ambulatory Visit | Attending: Orthopedic Surgery | Admitting: Orthopedic Surgery

## 2017-12-20 DIAGNOSIS — Z7982 Long term (current) use of aspirin: Secondary | ICD-10-CM

## 2017-12-20 DIAGNOSIS — Z79899 Other long term (current) drug therapy: Secondary | ICD-10-CM

## 2017-12-20 DIAGNOSIS — J302 Other seasonal allergic rhinitis: Secondary | ICD-10-CM | POA: Diagnosis present

## 2017-12-20 DIAGNOSIS — Z91030 Bee allergy status: Secondary | ICD-10-CM

## 2017-12-20 DIAGNOSIS — Z833 Family history of diabetes mellitus: Secondary | ICD-10-CM | POA: Diagnosis not present

## 2017-12-20 DIAGNOSIS — M1712 Unilateral primary osteoarthritis, left knee: Secondary | ICD-10-CM | POA: Diagnosis present

## 2017-12-20 DIAGNOSIS — Z888 Allergy status to other drugs, medicaments and biological substances status: Secondary | ICD-10-CM | POA: Diagnosis not present

## 2017-12-20 DIAGNOSIS — F17210 Nicotine dependence, cigarettes, uncomplicated: Secondary | ICD-10-CM | POA: Diagnosis present

## 2017-12-20 DIAGNOSIS — I10 Essential (primary) hypertension: Secondary | ICD-10-CM | POA: Diagnosis present

## 2017-12-20 DIAGNOSIS — M5126 Other intervertebral disc displacement, lumbar region: Secondary | ICD-10-CM | POA: Diagnosis present

## 2017-12-20 DIAGNOSIS — Z90710 Acquired absence of both cervix and uterus: Secondary | ICD-10-CM

## 2017-12-20 DIAGNOSIS — G894 Chronic pain syndrome: Secondary | ICD-10-CM | POA: Diagnosis present

## 2017-12-20 DIAGNOSIS — G2581 Restless legs syndrome: Secondary | ICD-10-CM | POA: Diagnosis present

## 2017-12-20 DIAGNOSIS — E785 Hyperlipidemia, unspecified: Secondary | ICD-10-CM | POA: Diagnosis present

## 2017-12-20 DIAGNOSIS — Z791 Long term (current) use of non-steroidal anti-inflammatories (NSAID): Secondary | ICD-10-CM | POA: Diagnosis not present

## 2017-12-20 DIAGNOSIS — E669 Obesity, unspecified: Secondary | ICD-10-CM | POA: Diagnosis present

## 2017-12-20 DIAGNOSIS — Z6825 Body mass index (BMI) 25.0-25.9, adult: Secondary | ICD-10-CM | POA: Diagnosis not present

## 2017-12-20 DIAGNOSIS — D62 Acute posthemorrhagic anemia: Secondary | ICD-10-CM | POA: Diagnosis not present

## 2017-12-20 DIAGNOSIS — Z79891 Long term (current) use of opiate analgesic: Secondary | ICD-10-CM

## 2017-12-20 DIAGNOSIS — M5134 Other intervertebral disc degeneration, thoracic region: Secondary | ICD-10-CM | POA: Diagnosis present

## 2017-12-20 DIAGNOSIS — M25762 Osteophyte, left knee: Secondary | ICD-10-CM | POA: Diagnosis present

## 2017-12-20 DIAGNOSIS — E7800 Pure hypercholesterolemia, unspecified: Secondary | ICD-10-CM | POA: Diagnosis present

## 2017-12-20 DIAGNOSIS — F4001 Agoraphobia with panic disorder: Secondary | ICD-10-CM | POA: Diagnosis present

## 2017-12-20 DIAGNOSIS — M5136 Other intervertebral disc degeneration, lumbar region: Secondary | ICD-10-CM | POA: Diagnosis present

## 2017-12-20 DIAGNOSIS — M25462 Effusion, left knee: Secondary | ICD-10-CM | POA: Diagnosis present

## 2017-12-20 HISTORY — DX: Low back pain, unspecified: M54.50

## 2017-12-20 HISTORY — DX: Other seasonal allergic rhinitis: J30.2

## 2017-12-20 HISTORY — DX: Low back pain: M54.5

## 2017-12-20 HISTORY — PX: TOTAL KNEE ARTHROPLASTY: SHX125

## 2017-12-20 HISTORY — DX: Other chronic pain: G89.29

## 2017-12-20 HISTORY — DX: Migraine, unspecified, not intractable, without status migrainosus: G43.909

## 2017-12-20 SURGERY — ARTHROPLASTY, KNEE, TOTAL
Anesthesia: Spinal | Site: Knee | Laterality: Left

## 2017-12-20 MED ORDER — BUPIVACAINE LIPOSOME 1.3 % IJ SUSP
INTRAMUSCULAR | Status: DC | PRN
  Administered 2017-12-20: 20 mL

## 2017-12-20 MED ORDER — SODIUM CHLORIDE 0.9 % IR SOLN
Status: DC | PRN
  Administered 2017-12-20: 3000 mL

## 2017-12-20 MED ORDER — CELECOXIB 200 MG PO CAPS
200.0000 mg | ORAL_CAPSULE | Freq: Two times a day (BID) | ORAL | Status: DC
  Administered 2017-12-20 – 2017-12-22 (×4): 200 mg via ORAL
  Filled 2017-12-20 (×4): qty 1

## 2017-12-20 MED ORDER — CEFAZOLIN SODIUM-DEXTROSE 2-4 GM/100ML-% IV SOLN
INTRAVENOUS | Status: AC
  Filled 2017-12-20: qty 100

## 2017-12-20 MED ORDER — METHOCARBAMOL 1000 MG/10ML IJ SOLN
500.0000 mg | Freq: Four times a day (QID) | INTRAMUSCULAR | Status: DC | PRN
  Filled 2017-12-20: qty 5

## 2017-12-20 MED ORDER — TRANEXAMIC ACID 1000 MG/10ML IV SOLN
1000.0000 mg | Freq: Once | INTRAVENOUS | Status: AC
  Administered 2017-12-20: 1000 mg via INTRAVENOUS
  Filled 2017-12-20: qty 10

## 2017-12-20 MED ORDER — ACETAMINOPHEN 325 MG PO TABS
325.0000 mg | ORAL_TABLET | Freq: Four times a day (QID) | ORAL | Status: DC | PRN
  Administered 2017-12-22: 650 mg via ORAL
  Filled 2017-12-20: qty 2

## 2017-12-20 MED ORDER — BUPIVACAINE IN DEXTROSE 0.75-8.25 % IT SOLN
INTRATHECAL | Status: DC | PRN
  Administered 2017-12-20: 12 mg via INTRATHECAL

## 2017-12-20 MED ORDER — LIDOCAINE HCL (CARDIAC) PF 100 MG/5ML IV SOSY
PREFILLED_SYRINGE | INTRAVENOUS | Status: DC | PRN
  Administered 2017-12-20: 10 mg via INTRAVENOUS

## 2017-12-20 MED ORDER — CEFUROXIME SODIUM 1.5 G IV SOLR
INTRAVENOUS | Status: AC
  Filled 2017-12-20: qty 1.5

## 2017-12-20 MED ORDER — HYDROMORPHONE HCL 1 MG/ML IJ SOLN: 0.5000 mg | INTRAMUSCULAR | Status: DC | PRN

## 2017-12-20 MED ORDER — FENTANYL CITRATE (PF) 100 MCG/2ML IJ SOLN: 100.0000 ug | Freq: Once | INTRAMUSCULAR | Status: DC

## 2017-12-20 MED ORDER — HYDROMORPHONE HCL 2 MG/ML IJ SOLN
0.2500 mg | INTRAMUSCULAR | Status: DC | PRN
  Administered 2017-12-20 (×2): 0.5 mg via INTRAVENOUS

## 2017-12-20 MED ORDER — ATORVASTATIN CALCIUM 20 MG PO TABS
20.0000 mg | ORAL_TABLET | Freq: Every day | ORAL | Status: DC
  Administered 2017-12-21 – 2017-12-22 (×2): 20 mg via ORAL
  Filled 2017-12-20 (×2): qty 1

## 2017-12-20 MED ORDER — ROPIVACAINE HCL 7.5 MG/ML IJ SOLN
INTRAMUSCULAR | Status: DC | PRN
  Administered 2017-12-20: 20 mL via PERINEURAL

## 2017-12-20 MED ORDER — MIDAZOLAM HCL 2 MG/2ML IJ SOLN
INTRAMUSCULAR | Status: DC | PRN
  Administered 2017-12-20: 2 mg via INTRAVENOUS

## 2017-12-20 MED ORDER — 0.9 % SODIUM CHLORIDE (POUR BTL) OPTIME
TOPICAL | Status: DC | PRN
  Administered 2017-12-20: 1000 mL

## 2017-12-20 MED ORDER — DEXAMETHASONE SODIUM PHOSPHATE 10 MG/ML IJ SOLN
10.0000 mg | Freq: Once | INTRAMUSCULAR | Status: DC
  Filled 2017-12-20: qty 1

## 2017-12-20 MED ORDER — CELECOXIB 200 MG PO CAPS: 200.0000 mg | ORAL_CAPSULE | Freq: Two times a day (BID) | ORAL | 2 refills | Status: DC

## 2017-12-20 MED ORDER — HYDROMORPHONE HCL 2 MG/ML IJ SOLN: 0.5000 mg | INTRAMUSCULAR | Status: DC | PRN

## 2017-12-20 MED ORDER — CEFAZOLIN SODIUM-DEXTROSE 2-4 GM/100ML-% IV SOLN
2.0000 g | INTRAVENOUS | Status: AC
  Administered 2017-12-20: 2 g via INTRAVENOUS

## 2017-12-20 MED ORDER — METHOCARBAMOL 500 MG PO TABS
500.0000 mg | ORAL_TABLET | Freq: Four times a day (QID) | ORAL | Status: DC | PRN
  Administered 2017-12-20 – 2017-12-22 (×5): 500 mg via ORAL
  Filled 2017-12-20 (×4): qty 1

## 2017-12-20 MED ORDER — ROPINIROLE HCL 1 MG PO TABS: 1.0000 mg | ORAL_TABLET | Freq: Every evening | ORAL | Status: DC | PRN

## 2017-12-20 MED ORDER — MIDAZOLAM HCL 2 MG/2ML IJ SOLN
INTRAMUSCULAR | Status: AC
  Administered 2017-12-20: 2 mg
  Filled 2017-12-20: qty 2

## 2017-12-20 MED ORDER — LIDOCAINE 2% (20 MG/ML) 5 ML SYRINGE
INTRAMUSCULAR | Status: AC
  Filled 2017-12-20: qty 5

## 2017-12-20 MED ORDER — PROMETHAZINE HCL 25 MG/ML IJ SOLN: 6.2500 mg | INTRAMUSCULAR | Status: DC | PRN

## 2017-12-20 MED ORDER — OXYCODONE HCL 5 MG PO TABS
ORAL_TABLET | ORAL | Status: AC
  Filled 2017-12-20: qty 1

## 2017-12-20 MED ORDER — KCL IN DEXTROSE-NACL 20-5-0.45 MEQ/L-%-% IV SOLN
INTRAVENOUS | Status: DC
  Administered 2017-12-20: 18:00:00 via INTRAVENOUS
  Filled 2017-12-20: qty 1000

## 2017-12-20 MED ORDER — PHENYLEPHRINE 40 MCG/ML (10ML) SYRINGE FOR IV PUSH (FOR BLOOD PRESSURE SUPPORT)
PREFILLED_SYRINGE | INTRAVENOUS | Status: AC
  Filled 2017-12-20: qty 10

## 2017-12-20 MED ORDER — MENTHOL 3 MG MT LOZG: 1.0000 | LOZENGE | OROMUCOSAL | Status: DC | PRN

## 2017-12-20 MED ORDER — PANTOPRAZOLE SODIUM 40 MG PO TBEC
40.0000 mg | DELAYED_RELEASE_TABLET | Freq: Every day | ORAL | Status: DC
  Administered 2017-12-20 – 2017-12-22 (×3): 40 mg via ORAL
  Filled 2017-12-20 (×3): qty 1

## 2017-12-20 MED ORDER — ALUM & MAG HYDROXIDE-SIMETH 200-200-20 MG/5ML PO SUSP: 30.0000 mL | ORAL | Status: DC | PRN

## 2017-12-20 MED ORDER — PHENOL 1.4 % MT LIQD: 1.0000 | OROMUCOSAL | Status: DC | PRN

## 2017-12-20 MED ORDER — FLEET ENEMA 7-19 GM/118ML RE ENEM
1.0000 | ENEMA | Freq: Once | RECTAL | Status: DC | PRN
Start: 2017-12-20 — End: 2017-12-22

## 2017-12-20 MED ORDER — ONDANSETRON HCL 4 MG/2ML IJ SOLN
4.0000 mg | Freq: Four times a day (QID) | INTRAMUSCULAR | Status: DC | PRN
Start: 2017-12-20 — End: 2017-12-22

## 2017-12-20 MED ORDER — LISINOPRIL 40 MG PO TABS
40.0000 mg | ORAL_TABLET | Freq: Every day | ORAL | Status: DC
  Administered 2017-12-21 – 2017-12-22 (×2): 40 mg via ORAL
  Filled 2017-12-20 (×2): qty 1

## 2017-12-20 MED ORDER — CHLORHEXIDINE GLUCONATE 4 % EX LIQD: 60.0000 mL | Freq: Once | CUTANEOUS | Status: DC

## 2017-12-20 MED ORDER — FENTANYL CITRATE (PF) 100 MCG/2ML IJ SOLN
INTRAMUSCULAR | Status: AC
  Administered 2017-12-20: 100 ug
  Filled 2017-12-20: qty 2

## 2017-12-20 MED ORDER — ASPIRIN EC 325 MG PO TBEC: 325.0000 mg | DELAYED_RELEASE_TABLET | Freq: Two times a day (BID) | ORAL | 0 refills | Status: DC

## 2017-12-20 MED ORDER — POLYETHYLENE GLYCOL 3350 17 G PO PACK: 17.0000 g | PACK | Freq: Every day | ORAL | Status: DC | PRN

## 2017-12-20 MED ORDER — OXYCODONE HCL 5 MG PO TABS
5.0000 mg | ORAL_TABLET | ORAL | Status: DC | PRN
  Administered 2017-12-20 – 2017-12-21 (×2): 5 mg via ORAL
  Administered 2017-12-21 (×3): 10 mg via ORAL
  Administered 2017-12-21: 5 mg via ORAL
  Administered 2017-12-22 (×4): 10 mg via ORAL
  Filled 2017-12-20: qty 2
  Filled 2017-12-20: qty 1
  Filled 2017-12-20 (×4): qty 2
  Filled 2017-12-20: qty 1
  Filled 2017-12-20 (×2): qty 2

## 2017-12-20 MED ORDER — LACTATED RINGERS IV SOLN
INTRAVENOUS | Status: DC
  Administered 2017-12-20 (×2): via INTRAVENOUS

## 2017-12-20 MED ORDER — METOCLOPRAMIDE HCL 5 MG/ML IJ SOLN: 5.0000 mg | Freq: Three times a day (TID) | INTRAMUSCULAR | Status: DC | PRN

## 2017-12-20 MED ORDER — HYDROXYZINE HCL 25 MG PO TABS
25.0000 mg | ORAL_TABLET | Freq: Two times a day (BID) | ORAL | Status: DC
  Administered 2017-12-20 – 2017-12-22 (×4): 25 mg via ORAL
  Filled 2017-12-20 (×4): qty 1

## 2017-12-20 MED ORDER — TRANEXAMIC ACID 1000 MG/10ML IV SOLN
INTRAVENOUS | Status: DC | PRN
  Administered 2017-12-20: 2000 mg via TOPICAL

## 2017-12-20 MED ORDER — DEXAMETHASONE SODIUM PHOSPHATE 10 MG/ML IJ SOLN
INTRAMUSCULAR | Status: AC
  Filled 2017-12-20: qty 1

## 2017-12-20 MED ORDER — MIDAZOLAM HCL 2 MG/2ML IJ SOLN: 2.0000 mg | Freq: Once | INTRAMUSCULAR | Status: DC

## 2017-12-20 MED ORDER — PROPOFOL 500 MG/50ML IV EMUL
INTRAVENOUS | Status: DC | PRN
  Administered 2017-12-20: 100 ug/kg/min via INTRAVENOUS

## 2017-12-20 MED ORDER — LACTATED RINGERS IV SOLN: INTRAVENOUS | Status: DC

## 2017-12-20 MED ORDER — DOCUSATE SODIUM 100 MG PO CAPS
100.0000 mg | ORAL_CAPSULE | Freq: Two times a day (BID) | ORAL | Status: DC
  Administered 2017-12-21 – 2017-12-22 (×3): 100 mg via ORAL
  Filled 2017-12-20 (×3): qty 1

## 2017-12-20 MED ORDER — ONDANSETRON HCL 4 MG/2ML IJ SOLN
INTRAMUSCULAR | Status: DC | PRN
  Administered 2017-12-20: 4 mg via INTRAVENOUS

## 2017-12-20 MED ORDER — BUPIVACAINE-EPINEPHRINE (PF) 0.25% -1:200000 IJ SOLN
INTRAMUSCULAR | Status: DC | PRN
  Administered 2017-12-20: 50 mL

## 2017-12-20 MED ORDER — METOCLOPRAMIDE HCL 5 MG PO TABS: 5.0000 mg | ORAL_TABLET | Freq: Three times a day (TID) | ORAL | Status: DC | PRN

## 2017-12-20 MED ORDER — MEPERIDINE HCL 50 MG/ML IJ SOLN: 6.2500 mg | INTRAMUSCULAR | Status: DC | PRN

## 2017-12-20 MED ORDER — CEFUROXIME SODIUM 1.5 G IV SOLR
INTRAVENOUS | Status: DC | PRN
  Administered 2017-12-20: 1.5 g via INTRAVENOUS

## 2017-12-20 MED ORDER — ONDANSETRON HCL 4 MG/2ML IJ SOLN
INTRAMUSCULAR | Status: AC
  Filled 2017-12-20: qty 2

## 2017-12-20 MED ORDER — BISACODYL 5 MG PO TBEC: 5.0000 mg | DELAYED_RELEASE_TABLET | Freq: Every day | ORAL | Status: DC | PRN

## 2017-12-20 MED ORDER — HYDROMORPHONE HCL 2 MG/ML IJ SOLN
INTRAMUSCULAR | Status: AC
  Administered 2017-12-20: 0.5 mg via INTRAVENOUS
  Filled 2017-12-20: qty 1

## 2017-12-20 MED ORDER — ACITRETIN 25 MG PO CAPS: 25.0000 mg | ORAL_CAPSULE | Freq: Every day | ORAL | Status: DC

## 2017-12-20 MED ORDER — SODIUM CHLORIDE 0.9 % IJ SOLN
INTRAMUSCULAR | Status: DC | PRN
  Administered 2017-12-20: 50 mL

## 2017-12-20 MED ORDER — ESCITALOPRAM OXALATE 10 MG PO TABS
10.0000 mg | ORAL_TABLET | Freq: Every day | ORAL | Status: DC
  Administered 2017-12-20 – 2017-12-22 (×3): 10 mg via ORAL
  Filled 2017-12-20 (×3): qty 1

## 2017-12-20 MED ORDER — METOPROLOL TARTRATE 50 MG PO TABS
50.0000 mg | ORAL_TABLET | Freq: Every day | ORAL | Status: DC
  Administered 2017-12-21 – 2017-12-22 (×2): 50 mg via ORAL
  Filled 2017-12-20 (×2): qty 1

## 2017-12-20 MED ORDER — PROPOFOL 10 MG/ML IV BOLUS
INTRAVENOUS | Status: DC | PRN
  Administered 2017-12-20: 20 mg via INTRAVENOUS

## 2017-12-20 MED ORDER — ASPIRIN EC 325 MG PO TBEC
325.0000 mg | DELAYED_RELEASE_TABLET | Freq: Every day | ORAL | Status: DC
  Administered 2017-12-21 – 2017-12-22 (×2): 325 mg via ORAL
  Filled 2017-12-20 (×2): qty 1

## 2017-12-20 MED ORDER — DEXAMETHASONE SODIUM PHOSPHATE 4 MG/ML IJ SOLN
INTRAMUSCULAR | Status: DC | PRN
  Administered 2017-12-20: 10 mg via INTRAVENOUS

## 2017-12-20 MED ORDER — MIDAZOLAM HCL 2 MG/2ML IJ SOLN: 0.5000 mg | Freq: Once | INTRAMUSCULAR | Status: DC | PRN

## 2017-12-20 MED ORDER — GABAPENTIN 300 MG PO CAPS: 300.0000 mg | ORAL_CAPSULE | Freq: Three times a day (TID) | ORAL | 2 refills | Status: DC

## 2017-12-20 MED ORDER — MIDAZOLAM HCL 2 MG/2ML IJ SOLN
INTRAMUSCULAR | Status: AC
  Filled 2017-12-20: qty 2

## 2017-12-20 MED ORDER — GABAPENTIN 300 MG PO CAPS
300.0000 mg | ORAL_CAPSULE | Freq: Three times a day (TID) | ORAL | Status: DC
  Administered 2017-12-20 – 2017-12-22 (×7): 300 mg via ORAL
  Filled 2017-12-20 (×7): qty 1

## 2017-12-20 MED ORDER — BUPIVACAINE-EPINEPHRINE (PF) 0.25% -1:200000 IJ SOLN
INTRAMUSCULAR | Status: AC
  Filled 2017-12-20: qty 60

## 2017-12-20 MED ORDER — ONDANSETRON HCL 4 MG PO TABS: 4.0000 mg | ORAL_TABLET | Freq: Four times a day (QID) | ORAL | Status: DC | PRN

## 2017-12-20 MED ORDER — HYDROMORPHONE HCL 2 MG/ML IJ SOLN
0.5000 mg | INTRAMUSCULAR | Status: DC | PRN
  Administered 2017-12-20 – 2017-12-21 (×4): 1 mg via INTRAVENOUS
  Filled 2017-12-20 (×4): qty 1

## 2017-12-20 MED ORDER — METHOCARBAMOL 500 MG PO TABS
ORAL_TABLET | ORAL | Status: AC
  Filled 2017-12-20: qty 1

## 2017-12-20 SURGICAL SUPPLY — 55 items
BAG DECANTER FOR FLEXI CONT (MISCELLANEOUS) ×2 IMPLANT
BANDAGE ESMARK 6X9 LF (GAUZE/BANDAGES/DRESSINGS) ×1 IMPLANT
BLADE SAG 18X100X1.27 (BLADE) ×3 IMPLANT
BLADE SAGITTAL 13X1.27X60 (BLADE) IMPLANT
BLADE SAGITTAL 13X1.27X60MM (BLADE)
BLADE SAW SGTL 13X75X1.27 (BLADE) ×2 IMPLANT
BNDG CMPR 9X6 STRL LF SNTH (GAUZE/BANDAGES/DRESSINGS) ×1
BNDG CMPR MED 10X6 ELC LF (GAUZE/BANDAGES/DRESSINGS) ×1
BNDG ELASTIC 6X10 VLCR STRL LF (GAUZE/BANDAGES/DRESSINGS) ×3 IMPLANT
BNDG ESMARK 6X9 LF (GAUZE/BANDAGES/DRESSINGS) ×3
BOWL SMART MIX CTS (DISPOSABLE) ×3 IMPLANT
CAPT KNEE TOTAL 3 ATTUNE ×2 IMPLANT
CEMENT HV SMART SET (Cement) ×6 IMPLANT
CONT SPEC 4OZ CLIKSEAL STRL BL (MISCELLANEOUS) ×2 IMPLANT
COVER SURGICAL LIGHT HANDLE (MISCELLANEOUS) ×3 IMPLANT
CUFF TOURNIQUET SINGLE 34IN LL (TOURNIQUET CUFF) ×3 IMPLANT
CUFF TOURNIQUET SINGLE 44IN (TOURNIQUET CUFF) IMPLANT
DRAPE EXTREMITY T 121X128X90 (DRAPE) ×3 IMPLANT
DRAPE U-SHAPE 47X51 STRL (DRAPES) ×3 IMPLANT
DRSG AQUACEL AG ADV 3.5X10 (GAUZE/BANDAGES/DRESSINGS) ×3 IMPLANT
DURAPREP 26ML APPLICATOR (WOUND CARE) ×3 IMPLANT
ELECT REM PT RETURN 9FT ADLT (ELECTROSURGICAL) ×3
ELECTRODE REM PT RTRN 9FT ADLT (ELECTROSURGICAL) ×1 IMPLANT
GLOVE BIO SURGEON STRL SZ7.5 (GLOVE) ×3 IMPLANT
GLOVE BIO SURGEON STRL SZ8.5 (GLOVE) ×3 IMPLANT
GLOVE BIOGEL PI IND STRL 8 (GLOVE) ×1 IMPLANT
GLOVE BIOGEL PI IND STRL 9 (GLOVE) ×1 IMPLANT
GLOVE BIOGEL PI INDICATOR 8 (GLOVE) ×2
GLOVE BIOGEL PI INDICATOR 9 (GLOVE) ×2
GOWN STRL REUS W/ TWL LRG LVL3 (GOWN DISPOSABLE) ×1 IMPLANT
GOWN STRL REUS W/ TWL XL LVL3 (GOWN DISPOSABLE) ×2 IMPLANT
GOWN STRL REUS W/TWL LRG LVL3 (GOWN DISPOSABLE) ×3
GOWN STRL REUS W/TWL XL LVL3 (GOWN DISPOSABLE) ×6
HANDPIECE INTERPULSE COAX TIP (DISPOSABLE) ×3
HOOD PEEL AWAY FACE SHEILD DIS (HOOD) ×6 IMPLANT
KIT BASIN OR (CUSTOM PROCEDURE TRAY) ×3 IMPLANT
KIT TURNOVER KIT B (KITS) ×3 IMPLANT
MANIFOLD NEPTUNE II (INSTRUMENTS) ×3 IMPLANT
NEEDLE 22X1 1/2 (OR ONLY) (NEEDLE) ×6 IMPLANT
NS IRRIG 1000ML POUR BTL (IV SOLUTION) ×3 IMPLANT
PACK TOTAL JOINT (CUSTOM PROCEDURE TRAY) ×3 IMPLANT
PAD ARMBOARD 7.5X6 YLW CONV (MISCELLANEOUS) ×6 IMPLANT
SET HNDPC FAN SPRY TIP SCT (DISPOSABLE) ×1 IMPLANT
SUT VIC AB 0 CT1 27 (SUTURE) ×3
SUT VIC AB 0 CT1 27XBRD ANBCTR (SUTURE) ×1 IMPLANT
SUT VIC AB 1 CTX 36 (SUTURE) ×3
SUT VIC AB 1 CTX36XBRD ANBCTR (SUTURE) ×1 IMPLANT
SUT VIC AB 2-0 CT1 27 (SUTURE) ×3
SUT VIC AB 2-0 CT1 TAPERPNT 27 (SUTURE) ×1 IMPLANT
SUT VIC AB 3-0 CT1 27 (SUTURE) ×3
SUT VIC AB 3-0 CT1 TAPERPNT 27 (SUTURE) ×1 IMPLANT
SYR CONTROL 10ML LL (SYRINGE) ×6 IMPLANT
TOWEL OR 17X24 6PK STRL BLUE (TOWEL DISPOSABLE) ×3 IMPLANT
TOWEL OR 17X26 10 PK STRL BLUE (TOWEL DISPOSABLE) ×3 IMPLANT
TRAY CATH 16FR W/PLASTIC CATH (SET/KITS/TRAYS/PACK) ×2 IMPLANT

## 2017-12-20 NOTE — Interval H&P Note (Signed)
History and Physical Interval Note:  12/20/2017 11:44 AM  Kendra Nelson  has presented today for surgery, with the diagnosis of LEFT KNEE OSTEOARTHRITIS  The various methods of treatment have been discussed with the patient and family. After consideration of risks, benefits and other options for treatment, the patient has consented to  Procedure(s): TOTAL KNEE ARTHROPLASTY (Left) as a surgical intervention .  The patient's history has been reviewed, patient examined, no change in status, stable for surgery.  I have reviewed the patient's chart and labs.  Questions were answered to the patient's satisfaction.     [name]

## 2017-12-20 NOTE — Anesthesia Preprocedure Evaluation (Signed)
Anesthesia Evaluation  Patient identified by MRN, date of birth, ID band Patient awake    Reviewed: Allergy & Precautions, NPO status , Patient's Chart, lab work & pertinent test results  History of Anesthesia Complications Negative for: history of anesthetic complications  Airway Mallampati: II  TM Distance: >3 FB Neck ROM: Full    Dental  (+) Caps, Dental Advisory Given   Pulmonary Current Smoker,    breath sounds clear to auscultation       Cardiovascular hypertension, Pt. on medications (-) angina Rhythm:Regular Rate:Normal     Neuro/Psych  Headaches, Anxiety Chronic back pain: narcotics    GI/Hepatic negative GI ROS, Neg liver ROS,   Endo/Other  negative endocrine ROS  Renal/GU negative Renal ROS     Musculoskeletal  (+) Arthritis , Osteoarthritis,    Abdominal   Peds  Hematology negative hematology ROS (+)   Anesthesia Other Findings   Reproductive/Obstetrics                             Anesthesia Physical Anesthesia Plan  ASA: II  Anesthesia Plan: Spinal   Post-op Pain Management:  Regional for Post-op pain   Induction:   PONV Risk Score and Plan: 1 and Ondansetron and Dexamethasone  Airway Management Planned: Natural Airway and Simple Face Mask  Additional Equipment:   Intra-op Plan:   Post-operative Plan:   Informed Consent: I have reviewed the patients History and Physical, chart, labs and discussed the procedure including the risks, benefits and alternatives for the proposed anesthesia with the patient or authorized representative who has indicated his/her understanding and acceptance.   Dental advisory given  Plan Discussed with: CRNA and Surgeon  Anesthesia Plan Comments: (Plan routine monitors, SAB with adductor canal block for post op analgesia)        Anesthesia Quick Evaluation

## 2017-12-20 NOTE — Anesthesia Procedure Notes (Signed)
Anesthesia Regional Block: Adductor canal block   Pre-Anesthetic Checklist: ,, timeout performed, Correct Patient, Correct Site, Correct Laterality, Correct Procedure, Correct Position, site marked, Risks and benefits discussed,  Surgical consent,  Pre-op evaluation,  At surgeon's request and post-op pain management  Laterality: Left and Lower  Prep: chloraprep       Needles:   Needle Type: Echogenic Needle     Needle Length: 9cm  Needle Gauge: 21     Additional Needles:   Procedures:,,,, ultrasound used (permanent image in chart),,,,  Narrative:  Start time: 12/20/2017 11:42 AM End time: 12/20/2017 11:48 AM Injection made incrementally with aspirations every 5 mL.  Performed by: Personally  Anesthesiologist: Jairo Ben, MD  Additional Notes: Pt identified in Holding room.  Monitors applied. Working IV access confirmed. Sterile prep, drape L thigh.  #21ga ECHOgenic needle into adductor canal with US guidance.  20cc 0.75% Ropivacaine injected incrementally after negative test dose.  Patient asymptomatic, VSS, no heme aspirated, tolerated well.  Sandford Craze, MD

## 2017-12-20 NOTE — Progress Notes (Signed)
Orthopedic Tech Progress Note Patient Details:  Kendra Nelson Apr 02, 1961 [id number]  Ortho Devices Type of Ortho Device: Bone foam zero knee Ortho Device/Splint Location: lle Ortho Device/Splint Interventions: Application   Post Interventions Patient Tolerated: Well Instructions Provided: Care of device   Nikki Dom 12/20/2017, 3:25 PM

## 2017-12-20 NOTE — Discharge Instructions (Signed)

## 2017-12-20 NOTE — Anesthesia Procedure Notes (Signed)
Spinal  Patient location during procedure: OR End time: 12/20/2017 1:08 PM Staffing Anesthesiologist: Jairo Ben, MD Performed: anesthesiologist  Preanesthetic Checklist Completed: patient identified, site marked, surgical consent, pre-op evaluation, timeout performed, IV checked, risks and benefits discussed and monitors and equipment checked Spinal Block Patient position: sitting Prep: site prepped and draped and DuraPrep Patient monitoring: blood pressure, continuous pulse ox, cardiac monitor and heart rate Approach: left paramedian (several attempts midline, #24ga Pencan) Location: L3-4 Needle Needle type: Quincke  Needle gauge: 25 G Needle length: 9 cm Additional Notes Pt identified in Operating room.  Monitors applied. Working IV access confirmed. Sterile prep, drape lumbar spine.  1% lido local L 2,3. Several attempts #24ga Pencan, all os. Repeat local L3,4 and #25ga Quincke clear CSF L paramedian.  12mg  0.75% Bupivacaine with dextrose injected with asp CSF beginning and end of injection.  Patient asymptomatic, VSS, no heme aspirated, tolerated well.  , MD

## 2017-12-20 NOTE — Anesthesia Postprocedure Evaluation (Signed)
Anesthesia Post Note  Patient: Kendra Nelson  Procedure(s) Performed: TOTAL KNEE ARTHROPLASTY (Left Knee)     Patient location during evaluation: PACU Anesthesia Type: Spinal and Regional Level of consciousness: awake and alert, oriented and patient cooperative Pain management: pain level controlled Vital Signs Assessment: post-procedure vital signs reviewed and stable Respiratory status: spontaneous breathing, nonlabored ventilation, respiratory function stable and patient connected to nasal cannula oxygen Cardiovascular status: blood pressure returned to baseline and stable Postop Assessment: spinal receding and patient able to bend at knees Anesthetic complications: no    Last Vitals:  Vitals:   12/20/17 1540 12/20/17 1555  BP: (!) 142/77 (!) 175/85  Pulse: (!) 53 (!) 53  Resp: 16 15  Temp:    SpO2: 93% 92%    Last Pain:  Vitals:   12/20/17 1540  TempSrc:   PainSc: Asleep                 [name],[name]

## 2017-12-20 NOTE — Op Note (Signed)
PATIENT ID:      Kendra Nelson  MRN:     [id number] DOB/AGE:    Aug 04, 1961 / 57 y.o.       OPERATIVE REPORT    DATE OF PROCEDURE:  12/20/2017       PREOPERATIVE DIAGNOSIS:   LEFT KNEE OSTEOARTHRITIS      Estimated body mass index is 25.69 kg/m as calculated from the following:   Height as of 12/14/17: 5\' 3"  (1.6 m).   Weight as of this encounter: 145 lb (65.8 kg).                                                        POSTOPERATIVE DIAGNOSIS:   LEFT KNEE OSTEOARTHRITIS                                                                       PROCEDURE:  Procedure(s): LTOTAL KNEE ARTHROPLASTY Using DepuyAttune RP implants #5L Femur, #6Tibia, 5 mm Attune RP bearing, 41 Patella     SURGEON:    ASSISTANT:   [name]. Tomi Likens   (Present and scrubbed throughout the case, critical for assistance with exposure, retraction, instrumentation, and closure.)         ANESTHESIA: Spinal, 20cc Exparel, 50cc 0.25% Marcaine  EBL: 500cc  FLUID REPLACEMENT: 1600 crystalloid  TOURNIQUET TIME: [employer]  Drains: None  Tranexamic Acid: 1gm IV, 2gm topical  COMPLICATIONS:  None         INDICATIONS FOR PROCEDURE: The patient has  LEFT KNEE OSTEOARTHRITIS, Var deformities, XR shows bone on bone arthritis, lateral subluxation of tibia. Patient has failed all conservative measures including anti-inflammatory medicines, narcotics, attempts at  exercise and weight loss, cortisone injections and viscosupplementation.  Risks and benefits of surgery have been discussed, questions answered.   DESCRIPTION OF PROCEDURE: The patient identified by armband, received  IV antibiotics, in the holding area at [facility] ([place]). Patient taken to the operating room, appropriate anesthetic  monitors were attached, and Spinal anesthesia was  induced. Tourniquet  applied high to the operative thigh. Lateral post and foot positioner  applied to the table, the lower extremity was then prepped and draped  in usual  sterile fashion from the toes to the tourniquet. Time-out procedure was performed. We began the operation, with the knee flexed 120 degrees, by making the anterior midline incision starting at handbreadth above the patella going over the patella 1 cm medial to and 4 cm distal to the tibial tubercle. Small bleeders in the skin and the  subcutaneous tissue identified and cauterized. Transverse retinaculum was incised and reflected medially and a medial parapatellar arthrotomy was accomplished. the patella was everted and theprepatellar fat pad resected. The superficial medial collateral  ligament was then elevated from anterior to posterior along the proximal  flare of the tibia and anterior half of the menisci resected. The knee was hyperflexed exposing bone on bone arthritis. Peripheral and notch osteophytes as well as the cruciate ligaments were then resected. We continued to  work our way around posteriorly along the proximal  tibia, and externally  rotated the tibia subluxing it out from underneath the femur. A McHale  retractor was placed through the notch and a lateral Hohmann retractor  placed, and we then drilled through the proximal tibia in line with the  axis of the tibia followed by an intramedullary guide rod and 2-degree  posterior slope cutting guide. The tibial cutting guide, 3 degree posterior sloped, was pinned into place allowing resection of 3 mm of bone medially and 9 mm of bone laterally. Satisfied with the tibial resection, we then  entered the distal femur 2 mm anterior to the PCL origin with the  intramedullary guide rod and applied the distal femoral cutting guide  set at 9 mm, with 5 degrees of valgus. This was pinned along the  epicondylar axis. At this point, the distal femoral cut was accomplished without difficulty. We then sized for a #5L femoral component and pinned the guide in 3 degrees of external rotation. The chamfer cutting guide was pinned into place. The anterior,  posterior, and chamfer cuts were accomplished without difficulty followed by  the Attune RP box cutting guide and the box cut. We also removed posterior osteophytes from the posterior femoral condyles. At this  time, the knee was brought into full extension. We checked our  extension and flexion gaps and found them symmetric for a 5 mm bearing. Distracting in extension with a lamina spreader, the posterior horns of the menisci were removed, and Exparel, diluted to 60 cc, with 20cc NS, and 20cc 0.5% Marcaine,was injected into the capsule and synovium of the knee. The posterior patella cut was accomplished with the 9.5 mm Attune cutting guide, sized for a 70mm dome, and the fixation pegs drilled.The knee  was then once again hyperflexed exposing the proximal tibia. We sized for a # 6 tibial base plate, applied the smokestack and the conical reamer followed by the the Delta fin keel punch. We then hammered into place the Attune RP trial femoral component, drilled the lugs, inserted a  5 mm trial bearing, trial patellar button, and took the knee through range of motion from 0-130 degrees. No thumb pressure was required for patellar Tracking. At this point, the limb was wrapped with an Esmarch bandage and the tourniquet inflated to 350 mmHg. All trial components were removed, mating surfaces irrigated with pulse lavage, and dried with suction and sponges. 10 cc of the Exparel solution was applied to the cancellus bone of the patella distal femur and proximal tibia.  After waiting 1 minute, the bony surfaces were again, dried with sponges. A double batch of DePuy HV cement with 1500 mg of Zinacef was mixed and applied to all bony metallic mating surfaces except for the posterior condyles of the femur itself. In order, we hammered into place the tibial tray and removed excess cement, the femoral component and removed excess cement. The final Attune RP bearing  was inserted, and the knee brought to full extension with  compression.  The patellar button was clamped into place, and excess cement  removed. While the cement cured the wound was irrigated out with normal saline solution pulse lavage. Ligament stability and patellar tracking were checked and found to be excellent. The parapatellar arthrotomy was closed with  running #1 Vicryl suture. The subcutaneous tissue with 0 and 2-0 undyed  Vicryl suture, and the skin with running 3-0 SQ vicryl. A dressing of Xeroform,  4 x 4, dressing sponges, Webril, and Ace wrap applied. The patient  awakened, and  taken to recovery room without difficulty.   [name] 12/20/2017, 2:24 PM  Testosterone finished up on Lita Mains foreign

## 2017-12-20 NOTE — Transfer of Care (Signed)
Immediate Anesthesia Transfer of Care Note  Patient: Kendra Nelson  Procedure(s) Performed: TOTAL KNEE ARTHROPLASTY (Left Knee)  Patient Location: PACU  Anesthesia Type:Spinal and MAC combined with regional for post-op pain  Level of Consciousness: awake and alert   Airway & Oxygen Therapy: Patient Spontanous Breathing  Post-op Assessment: Report given to RN and Post -op Vital signs reviewed and stable  Post vital signs: Reviewed and stable  Last Vitals:  Vitals Value Taken Time  BP 159/74 12/20/2017  2:52 PM  Temp 36.1 C 12/20/2017  2:52 PM  Pulse 58 12/20/2017  3:02 PM  Resp 21 12/20/2017  3:02 PM  SpO2 97 % 12/20/2017  3:02 PM  Vitals shown include unvalidated device data.  Last Pain:  Vitals:   12/20/17 1452  TempSrc:   PainSc: (P) 0-No pain      Patients Stated Pain Goal: 7 (14/60/47 9987)  Complications: No apparent anesthesia complications

## 2017-12-21 ENCOUNTER — Encounter (HOSPITAL_COMMUNITY): Payer: Self-pay | Admitting: Orthopedic Surgery

## 2017-12-21 LAB — BASIC METABOLIC PANEL
Anion gap: 10 (ref 5–15)
BUN: 15 mg/dL (ref 6–20)
CALCIUM: 9 mg/dL (ref 8.9–10.3)
CHLORIDE: 102 mmol/L (ref 101–111)
CO2: 22 mmol/L (ref 22–32)
Creatinine, Ser: 0.72 mg/dL (ref 0.44–1.00)
GFR calc Af Amer: >60 mL/min (ref >60)
Glucose, Bld: 147 mg/dL — ABNORMAL HIGH (ref 65–99)
Potassium: 4.4 mmol/L (ref 3.5–5.1)
SODIUM: 134 mmol/L — AB (ref 135–145)

## 2017-12-21 LAB — CBC
HEMATOCRIT: 37.6 % (ref 36.0–46.0)
HEMOGLOBIN: 12.6 g/dL (ref 12.0–15.0)
MCH: 29.7 pg (ref 26.0–34.0)
MCHC: 33.5 g/dL (ref 30.0–36.0)
MCV: 88.7 fL (ref 78.0–100.0)
Platelets: 258 10*3/uL (ref 150–400)
RBC: 4.24 MIL/uL (ref 3.87–5.11)
RDW: 13.7 % (ref 11.5–15.5)
WBC: 18.4 10*3/uL — ABNORMAL HIGH (ref 4.0–10.5)

## 2017-12-21 NOTE — Care Plan (Signed)
Met with patient at bedside States she is feeling ok. Equipment delivered to bedside. Has been up with therapy. She is set up with [facility] at [place] for [place]. She has OPPT set up to follow her MD appt on 01/04/18 930.   Please contact Ladell Heads [facility] [phone] with question or if this plan needs to change.

## 2017-12-21 NOTE — Progress Notes (Signed)
Pt has been unable to spontaneously void. I/o cath x 2 on this shift. Paged md for further orders. Pt states she has had to use I/o cath at home after another previous surgery.

## 2017-12-21 NOTE — Progress Notes (Addendum)
PATIENT ID: Kendra Nelson  MRN: [id number]  DOB/AGE:  [birth year] / 57 y.o.  1 Day Post-Op Procedure(s) (LRB): TOTAL KNEE ARTHROPLASTY (Left)    PROGRESS NOTE Subjective: Patient is alert, oriented, no Nausea, no Vomiting, yes passing gas. Taking PO well. Denies SOB, Chest or Calf Pain. Using Incentive Spirometer, PAS in place. Ambulate patient walking in room without difficulty, Patient reports pain as 3/10.  Did not receive physical therapy yesterday. .    Objective: Vital signs in last 24 hours: Vitals:   12/20/17 1610 12/20/17 1625 12/20/17 1655 12/20/17 1958  BP: (Abnormal) 162/81 (Abnormal) 167/81  (Abnormal) 153/77  Pulse: (Abnormal) 58 (Abnormal) 59  (Abnormal) 55  Resp: 11 14  18   Temp:  (Abnormal) 97.5 F (36.4 C) (Abnormal) 97.5 F (36.4 C) 98.4 F (36.9 C)  TempSrc:    Oral  SpO2: 92% 91%  94%  Weight:          Intake/Output from previous day: I/O last 3 completed shifts: In: 1300 [I.V.:1300] Out: 350 [Blood:350]   Intake/Output this shift: No intake/output data recorded.   LABORATORY DATA: No results for input(s): WBC, HGB, HCT, PLT, NA, K, CL, CO2, BUN, CREATININE, GLUCOSE, GLUCAP, INR, CALCIUM in the last 72 hours.  Invalid input(s): PT, 2  Examination: Neurologically intact ABD soft Neurovascular intact Sensation intact distally Intact pulses distally Dorsiflexion/Plantar flexion intact Incision: moderate drainage No cellulitis present Compartment soft}  Assessment:   1 Day Post-Op Procedure(s) (LRB): TOTAL KNEE ARTHROPLASTY (Left) ADDITIONAL DIAGNOSIS: Expected Acute Blood Loss Anemia, Hypertension, chronic pain management, overweight Anticipated LOS equal to or greater than 2 midnights due to - Age 56 and older with one or more of the following:  - Obesity  - Expected need for hospital services (PT, OT, Nursing) required for safe  discharge  - Anticipated need for postoperative skilled nursing care or inpatient rehab  - Active  co-morbidities: Chronic pain requiring opiods   Plan: PT/OT WBAT, AROM and PROM  DVT Prophylaxis:  SCDx72hrs, ASA 325 mg BID x 2 weeks DISCHARGE PLAN: Home, when ambulating safely and passes physical therapy possibly today definitely tomorrow DISCHARGE NEEDS: HHPT, Walker and 3-in-1 comode seat     76 12/21/2017, 7:11 AM

## 2017-12-21 NOTE — Plan of Care (Signed)
  Problem: Pain Managment: Goal: General experience of comfort will improve Outcome: Progressing   

## 2017-12-21 NOTE — Evaluation (Signed)
Physical Therapy Evaluation Patient Details Name: Kendra Nelson MRN: [id number] DOB: [birth year] Today's Date: 12/21/2017   History of Present Illness  57 y.o. female admitted on 12/20/17 for elective L TKA WBAT post op.  Pt with significant PMH of HTN, migraine, anxiety, neuromuscular disorder, skin abnormalaties, spinal cord stimulator, multiple lower back surgeries.   Clinical Impression  Pt is POD #1 and is moving well, albeit impulsively with RW, requiring significant verbal cues for safety.  Pt reports h/o frequent falls at home, so significant reinforcement needed for safe RW use and to USE it and not walk across the room without it right now.  HEP and knee precautions reviewed.   PT to follow acutely for deficits listed below.      Follow Up Recommendations Follow surgeon's recommendation for DC plan and follow-up therapies;Supervision for mobility/OOB    Equipment Recommendations  Rolling walker with 5" wheels;3in1 (PT);Other (comment)(already delivered to room)    Recommendations for Other Services   NA    Precautions / Restrictions Precautions Precautions: Knee Precaution Booklet Issued: Yes (comment) Precaution Comments: knee exercise handout given and reviewed.   Restrictions Weight Bearing Restrictions: Yes LLE Weight Bearing: Weight bearing as tolerated      Mobility  Bed Mobility Overal bed mobility: Modified Independent             General bed mobility comments: Pt able to get herself to EOB with use of the railing.   Transfers Overall transfer level: Needs assistance Equipment used: Rolling walker (2 wheeled) Transfers: Sit to/from Stand Sit to Stand: Min guard         General transfer comment: Min guard assist for safety.  Pt stood before IV pole and RW were EOB with her.  Max verbal cues to sit back down and for safety.  She also wants to park the walker and walk around the room.  She is quite impulsive and a very high fall risk.    Ambulation/Gait Ambulation/Gait assistance: Min guard Ambulation Distance (Feet): 85 Feet Assistive device: Rolling walker (2 wheeled) Gait Pattern/deviations: Step-to pattern;Antalgic;Trunk flexed Gait velocity: decreased   General Gait Details: Max verbal cues for safe RW use, pt stepping outside of RW, not close enough in proximity to it with buckling left knee.          Balance Overall balance assessment: Needs assistance Sitting-balance support: Feet supported;No upper extremity supported Sitting balance-Leahy Scale: Good     Standing balance support: No upper extremity supported Standing balance-Leahy Scale: Fair                               Pertinent Vitals/Pain Pain Assessment: 0-10 Pain Score: 6  Pain Location: left knee Pain Descriptors / Indicators: Aching;Burning Pain Intervention(s): Limited activity within patient's tolerance;Monitored during session;Repositioned    Home Living Family/patient expects to be discharged to:: Private residence Living Arrangements: Spouse/significant other;Children;Other (Comment)(6, 11, 14, 18) Available Help at Discharge: Family;Available 24 hours/day Type of Home: House Home Access: Stairs to enter Entrance Stairs-Rails: None Entrance Stairs-Number of Steps: 3 Home Layout: Two level;1/2 bath on main level(bedroom and 1/2 bath downstairs) Home Equipment: Walker - 2 wheels;Bedside commode Additional Comments: does not work, drives    Prior Function Level of Independence: Needs assistance   Gait / Transfers Assistance Needed: walks independently, remote h/o falls           Hand Dominance   Dominant Hand: Right    Extremity/Trunk  Assessment   Upper Extremity Assessment Upper Extremity Assessment: Overall WFL for tasks assessed    Lower Extremity Assessment Lower Extremity Assessment: LLE deficits/detail LLE Deficits / Details: left leg with normal post op pain and weakness.  Ankle at least 3/5,  knee 3-/5, hip flexion 3-/5 LLE Sensation: WNL    Cervical / Trunk Assessment Cervical / Trunk Assessment: Other exceptions Cervical / Trunk Exceptions: significant PMH of back pain and surgery  Communication   Communication: No difficulties  Cognition Arousal/Alertness: Awake/alert Behavior During Therapy: WFL for tasks assessed/performed Overall Cognitive Status: Within Functional Limits for tasks assessed                                           Exercises Total Joint Exercises Ankle Circles/Pumps: AROM;Both;20 reps Quad Sets: AROM;Left;10 reps Towel Squeeze: AROM;Both;10 reps Heel Slides: AAROM;Both;10 reps Goniometric ROM: 12-80   Assessment/Plan    PT Assessment Patient needs continued PT services  PT Problem List Decreased strength;Decreased range of motion;Decreased activity tolerance;Decreased balance;Decreased mobility;Decreased knowledge of use of DME;Decreased knowledge of precautions;Decreased safety awareness;Pain       PT Treatment Interventions DME instruction;Gait training;Stair training;Functional mobility training;Therapeutic activities;Therapeutic exercise;Balance training;Patient/family education;Manual techniques;Modalities    PT Goals (Current goals can be found in the Care Plan section)  Acute Rehab PT Goals Patient Stated Goal: be ready for graduation and summer (being more physically active- pool and trampoline, walking, biking.   PT Goal Formulation: With patient Time For Goal Achievement: 12/28/17 Potential to Achieve Goals: Good    Frequency 7X/week           AM-PAC PT "6 Clicks" Daily Activity  Outcome Measure Difficulty turning over in bed (including adjusting bedclothes, sheets and blankets)?: A Little Difficulty moving from lying on back to sitting on the side of the bed? : A Little Difficulty sitting down on and standing up from a chair with arms (e.g., wheelchair, bedside commode, etc,.)?: None Help needed moving to  and from a bed to chair (including a wheelchair)?: None Help needed walking in hospital room?: A Little Help needed climbing 3-5 steps with a railing? : A Little 6 Click Score: 20    End of Session   Activity Tolerance: Patient tolerated treatment well Patient left: in chair;with call bell/phone within reach;with chair alarm set Nurse Communication: Mobility status PT Visit Diagnosis: Muscle weakness (generalized) (M62.81);Difficulty in walking, not elsewhere classified (R26.2);Pain Pain - Right/Left: Left Pain - part of body: Knee    Time: 1100-1140 PT Time Calculation (min) (ACUTE ONLY): 40 min   Charges:          Lurena Joiner B. Duncan Alejandro, PT, DPT [phone]   PT Evaluation $PT Eval Moderate Complexity: 1 Mod PT Treatments $Gait Training: 8-22 mins $Therapeutic Exercise: 8-22 mins   12/21/2017, 2:25 PM

## 2017-12-21 NOTE — Progress Notes (Signed)
Physical Therapy Treatment Patient Details Name: Kendra Nelson MRN: [id number] DOB: [birth year] Today's Date: 12/21/2017    History of Present Illness 57 y.o. female admitted on 12/20/17 for elective L TKA WBAT post op.  Pt with significant PMH of HTN, migraine, anxiety, neuromuscular disorder, skin abnormalaties, spinal cord stimulator, multiple lower back surgeries.     PT Comments    Pt is POD #1 and is progressing well with gait and mobility.  She continues to need cues for safety, but better compliance with cues and RW use this session compared to the AM.  She was able to do stair training and we completed all but the seated flexion exercises.  PT will continue to follow acutely to progress safe mobility.    Follow Up Recommendations  Follow surgeon's recommendation for DC plan and follow-up therapies;Supervision for mobility/OOB     Equipment Recommendations  Rolling walker with 5" wheels;3in1 (PT);Other (comment)(already delivered to room)    Recommendations for Other Services   NA     Precautions / Restrictions Precautions Precautions: Knee Precaution Booklet Issued: Yes (comment) Precaution Comments: knee exercise handout given and reviewed.  Knee precaution reviewed Restrictions Weight Bearing Restrictions: Yes LLE Weight Bearing: Weight bearing as tolerated    Mobility  Bed Mobility Overal bed mobility: Modified Independent             General bed mobility comments: Pt able to get herself to EOB with use of the railing.   Transfers Overall transfer level: Needs assistance Equipment used: Rolling walker (2 wheeled) Transfers: Sit to/from Stand Sit to Stand: Min guard         General transfer comment: Min guard assist for safety.  Pt was less impulsive and waited until therapist was ready this session.   Ambulation/Gait Ambulation/Gait assistance: Min guard Ambulation Distance (Feet): 200 Feet Assistive device: Rolling walker (2 wheeled) Gait  Pattern/deviations: Antalgic;Trunk flexed;Step-through pattern Gait velocity: decreased   General Gait Details: Verbal cues to stay closer to RW and for upright posture, Very mildly antalgic gait pattern.    Stairs Stairs: Yes Stairs assistance: Supervision Stair Management: Two rails;Step to pattern;Forwards Number of Stairs: 5(x2) General stair comments: Pt was able to reciprocally walk up and down the stairs despite verbal cues from PT that it would be safer to preform step to pattern leading up with her good leg and down with her sore leg, however, pt kept forgetting the sequencing and ended up doing a few painful looking steps reciprocally both up and down the stairs.  With the railing for support she was able to compensate for the painful steps.  Supervision for safety.           Balance Overall balance assessment: Needs assistance Sitting-balance support: Feet supported;No upper extremity supported Sitting balance-Leahy Scale: Good     Standing balance support: No upper extremity supported Standing balance-Leahy Scale: Fair                              Cognition Arousal/Alertness: Awake/alert Behavior During Therapy: WFL for tasks assessed/performed Overall Cognitive Status: Within Functional Limits for tasks assessed                                        Exercises Total Joint Exercises Short Arc Quad: AROM;Left;10 reps Hip ABduction/ADduction: AAROM;Left;10 reps Straight Leg Raises: AAROM;Left;10 reps Long  Arc Quad: AROM;Left;10 reps        Pertinent Vitals/Pain Pain Assessment: Faces Faces Pain Scale: Hurts little more Pain Location: left knee Pain Descriptors / Indicators: Aching;Burning Pain Intervention(s): Limited activity within patient's tolerance;Monitored during session;Repositioned    Home Living                      Prior Function            PT Goals (current goals can now be found in the care plan  section) Acute Rehab PT Goals Patient Stated Goal: be ready for graduation and summer (being more physically active- pool and trampoline, walking, biking.   Progress towards PT goals: Progressing toward goals    Frequency    7X/week      PT Plan Current plan remains appropriate       AM-PAC PT "6 Clicks" Daily Activity  Outcome Measure  Difficulty turning over in bed (including adjusting bedclothes, sheets and blankets)?: A Little Difficulty moving from lying on back to sitting on the side of the bed? : A Little Difficulty sitting down on and standing up from a chair with arms (e.g., wheelchair, bedside commode, etc,.)?: None Help needed moving to and from a bed to chair (including a wheelchair)?: None Help needed walking in hospital room?: A Little Help needed climbing 3-5 steps with a railing? : A Little 6 Click Score: 20    End of Session   Activity Tolerance: Patient tolerated treatment well Patient left: in chair;with call bell/phone within reach;with chair alarm set Nurse Communication: Mobility status PT Visit Diagnosis: Muscle weakness (generalized) (M62.81);Difficulty in walking, not elsewhere classified (R26.2);Pain Pain - Right/Left: Left Pain - part of body: Knee     Time: 6812-7517 PT Time Calculation (min) (ACUTE ONLY): 15 min  Charges:  $Gait Training: 8-22 mins                    Minas Bonser B. Tytan Sandate, PT, DPT [phone]    12/21/2017, 10:52 PM

## 2017-12-22 LAB — CBC
HCT: 34.5 % — ABNORMAL LOW (ref 36.0–46.0)
Hemoglobin: 11.3 g/dL — ABNORMAL LOW (ref 12.0–15.0)
MCH: 29.8 pg (ref 26.0–34.0)
MCHC: 32.8 g/dL (ref 30.0–36.0)
MCV: 91 fL (ref 78.0–100.0)
PLATELETS: 201 10*3/uL (ref 150–400)
RBC: 3.79 MIL/uL — ABNORMAL LOW (ref 3.87–5.11)
RDW: 14.5 % (ref 11.5–15.5)
WBC: 10.8 10*3/uL — AB (ref 4.0–10.5)

## 2017-12-22 MED ORDER — OXYCODONE HCL 10 MG PO TABS: 10.0000 mg | ORAL_TABLET | Freq: Four times a day (QID) | ORAL | 0 refills | Status: DC | PRN

## 2017-12-22 MED ORDER — SELF-CATH SYSTEM STRAIGHT TIP KIT: 1.0000 [IU] | PACK | Freq: Four times a day (QID) | 2 refills | Status: DC | PRN

## 2017-12-22 NOTE — Care Management Note (Signed)
Case Management Note  Patient Details  Name: Kendra Nelson MRN: [id number] Date of Birth: May 03, 1961  Subjective/Objective: 57 yr old female s/p left total knee arthroplasty.                   Action/Plan: Patient was preoperatively setup with [facility], no changes. She will have family support at discharge.    Expected Discharge Date:  12/22/17               Expected Discharge Plan:  Home w Home Health Services  In-House Referral:  NA  Discharge planning Services  CM Consult  Post Acute Care Choice:  Home Health, Durable Medical Equipment Choice offered to:  Patient  DME Arranged:  3-N-1, Walker rolling DME Agency:  [employer]  HH Arranged:  PT HH Agency:  [facility]  Status of Service:  Completed, signed off  If discussed at [employer] [facility] [employer], dates discussed:    Additional Comments:  Durenda Guthrie, RN 12/22/2017, 2:33 PM

## 2017-12-22 NOTE — Progress Notes (Signed)
PATIENT ID: Kendra Nelson  MRN: [id number]  DOB/AGE:  56-Jul-1962 / 56 y.o.  2 Days Post-Op Procedure(s) (LRB): TOTAL KNEE ARTHROPLASTY (Left)    PROGRESS NOTE Subjective: Patient is alert, oriented, no Nausea, no Vomiting, yes passing gas. Taking PO well. Denies SOB, Chest or Calf Pain. Using Incentive Spirometer, PAS in place. Ambulate WBAT with pt walking 200 ft with therapy, Patient reports pain as moderate to severe .    Objective: Vital signs in last 24 hours: Vitals:   12/21/17 0652 12/21/17 1100 12/21/17 2025 12/22/17 0513  BP: (!) 142/68 124/61 138/68 (!) 143/58  Pulse: 60 (!) 54 68 73  Resp: '17  16 18  ' Temp: 98.1 F (36.7 C) 98.1 F (36.7 C) 98.5 F (36.9 C) 97.8 F (36.6 C)  TempSrc: Oral Oral Oral Oral  SpO2: 94% 91% 98% 96%  Weight:          Intake/Output from previous day: I/O last 3 completed shifts: In: 720 [P.O.:720] Out: 3350 [Urine:3350]   Intake/Output this shift: No intake/output data recorded.   LABORATORY DATA: Recent Labs    12/21/17 0647 12/22/17 0621  WBC 18.4* 10.8*  HGB 12.6 11.3*  HCT 37.6 34.5*  PLT 258 201  NA 134*  --   K 4.4  --   CL 102  --   CO2 22  --   BUN 15  --   CREATININE 0.72  --   GLUCOSE 147*  --   CALCIUM 9.0  --     Examination: Neurologically intact Neurovascular intact Sensation intact distally Intact pulses distally Dorsiflexion/Plantar flexion intact Incision: dressing C/D/I and scant drainage No cellulitis present Compartment soft}  Assessment:   2 Days Post-Op Procedure(s) (LRB): TOTAL KNEE ARTHROPLASTY (Left) ADDITIONAL DIAGNOSIS: Expected Acute Blood Loss Anemia, Hypertension, chronic pain management Anticipated LOS equal to or greater than 2 midnights due to - Age 56 and older with one or more of the following:             - Obesity             - Expected need for hospital services (PT, OT, Nursing) required for safe   discharge             - Anticipated need for postoperative skilled  nursing care or inpatient rehab             - Active co-morbidities: Chronic pain requiring opiods     Plan: PT/OT WBAT, AROM and PROM  DVT Prophylaxis:  SCDx72hrs, ASA 325 mg BID x 2 weeks DISCHARGE PLAN: Home DISCHARGE NEEDS: HHPT, Walker, 3-in-1 comode seat and self cath [employer] 12/22/2017, 8:52 AM

## 2017-12-22 NOTE — Progress Notes (Signed)
Physical Therapy Treatment Patient Details Name: Kendra Nelson MRN: [id number] DOB: 1961/01/24 Today's Date: 12/22/2017    History of Present Illness 57 y.o. female admitted on 12/20/17 for elective L TKA WBAT post op.  Pt with significant PMH of HTN, migraine, anxiety, neuromuscular disorder, skin abnormalaties, spinal cord stimulator, multiple lower back surgeries.     PT Comments    Pt is more sore in her left knee today, which is to be expected.  She continues to mobilize well and ice was applied after practicing gait and HEP program which pt reported felt good.  PT will continue to follow acutely to progress mobility until d/c confirmed. She practiced stairs yesterday, but we will do it again this PM if she is still here.    Follow Up Recommendations  Follow surgeon's recommendation for DC plan and follow-up therapies;Supervision for mobility/OOB     Equipment Recommendations  Rolling walker with 5" wheels;3in1 (PT);Other (comment)    Recommendations for Other Services   NA     Precautions / Restrictions Precautions Precautions: Knee Precaution Booklet Issued: Yes (comment) Precaution Comments: knee exercise handout given and reviewed.  Knee precaution reviewed Restrictions LLE Weight Bearing: Weight bearing as tolerated    Mobility  Bed Mobility Overal bed mobility: Modified Independent             General bed mobility comments: Pt able to get herself EOB today, but much slower than yesterday.  She is reporting a significant increase in her pain.   Transfers Overall transfer level: Needs assistance Equipment used: Rolling walker (2 wheeled) Transfers: Sit to/from Stand Sit to Stand: Min guard         General transfer comment: Min guard assist for safety, cues to wait until therapist is ready  Ambulation/Gait Ambulation/Gait assistance: Min guard Ambulation Distance (Feet): 100 Feet Assistive device: Rolling walker (2 wheeled) Gait Pattern/deviations:  Step-through pattern;Antalgic     General Gait Details: Moderately antalgic gait pattern today, but continues to be able to walk with very little assistance, speed is slower today, but that is ok as she is safer at a slower speed.       Balance Overall balance assessment: Needs assistance Sitting-balance support: Feet supported;No upper extremity supported Sitting balance-Leahy Scale: Good     Standing balance support: No upper extremity supported Standing balance-Leahy Scale: Fair                              Cognition Arousal/Alertness: Awake/alert Behavior During Therapy: WFL for tasks assessed/performed Overall Cognitive Status: Within Functional Limits for tasks assessed                                        Exercises Total Joint Exercises Ankle Circles/Pumps: AROM;Both;20 reps Quad Sets: AROM;Left;10 reps Towel Squeeze: AROM;Both;10 reps Heel Slides: AAROM;Both;10 reps Goniometric ROM: 12-70        Pertinent Vitals/Pain Pain Assessment: Faces Faces Pain Scale: Hurts even more Pain Location: left knee Pain Descriptors / Indicators: Aching;Burning Pain Intervention(s): Limited activity within patient's tolerance;Monitored during session;Repositioned;Ice applied           PT Goals (current goals can now be found in the care plan section) Acute Rehab PT Goals Patient Stated Goal: be ready for graduation and summer (being more physically active- pool and trampoline, walking, biking.   Progress towards PT goals:  Progressing toward goals    Frequency    7X/week      PT Plan Current plan remains appropriate       AM-PAC PT "6 Clicks" Daily Activity  Outcome Measure  Difficulty turning over in bed (including adjusting bedclothes, sheets and blankets)?: A Little Difficulty moving from lying on back to sitting on the side of the bed? : A Little Difficulty sitting down on and standing up from a chair with arms (e.g.,  wheelchair, bedside commode, etc,.)?: A Little Help needed moving to and from a bed to chair (including a wheelchair)?: A Little Help needed walking in hospital room?: A Little Help needed climbing 3-5 steps with a railing? : A Little 6 Click Score: 18    End of Session   Activity Tolerance: Patient limited by pain Patient left: in chair;with call bell/phone within reach;with chair alarm set   PT Visit Diagnosis: Muscle weakness (generalized) (M62.81);Difficulty in walking, not elsewhere classified (R26.2);Pain Pain - Right/Left: Left Pain - part of body: Knee     Time: 4166-0630 PT Time Calculation (min) (ACUTE ONLY): 19 min  Charges:  $Gait Training: 8-22 mins          Shaylon Aden B. Kadence Mikkelson, PT, DPT [phone]            12/22/2017, 2:41 PM

## 2017-12-22 NOTE — Discharge Summary (Signed)
Patient ID: Kendra Nelson MRN: [id number] DOB/AGE: 56-Dec-1962 31 y.o.  Admit date: 12/20/2017 Discharge date: 12/22/2017  Admission Diagnoses:  Principal Problem:   Degenerative arthritis of left knee Active Problems:   Primary osteoarthritis of left knee   Discharge Diagnoses:  Same  Past Medical History:  Diagnosis Date  . Anxiety    off of valium- stress  . Arthritis    left knee- rhumatoid- hasnt seen specialist   . Chronic lower back pain   . Complication of anesthesia    WOKE DURING BACK SURGERY ; urinary retention 12/20/2017  . Hyperlipidemia   . Hypertension   . Migraine    "when seasons change" (12/20/2017)  . Neuromuscular disorder ([place])   . Seasonal allergies   . Skin abnormalities     Surgeries: Procedure(s): TOTAL KNEE ARTHROPLASTY on 12/20/2017   Consultants:   Discharged Condition: Improved  Hospital Course: Kendra Nelson is an 56 y.o. female who was admitted 12/20/2017 for operative treatment ofDegenerative arthritis of left knee. Patient has severe unremitting pain that affects sleep, daily activities, and work/hobbies. After pre-op clearance the patient was taken to the operating room on 12/20/2017 and underwent  Procedure(s): TOTAL KNEE ARTHROPLASTY.    Patient was given perioperative antibiotics:  Anti-infectives (From admission, onward)   Start     Dose/Rate Route Frequency Ordered Stop   12/20/17 1339  cefUROXime (ZINACEF) injection  Status:  Discontinued       As needed 12/20/17 1339 12/20/17 1447   12/20/17 1033  ceFAZolin (ANCEF) 2-4 GM/100ML-% IVPB    Note to Pharmacy:  Nyoka Cowden   : cabinet override      12/20/17 1033 12/20/17 1310   12/20/17 1024  ceFAZolin (ANCEF) IVPB 2g/100 mL premix     2 g 200 mL/hr over 30 Minutes Intravenous On call to O.R. 12/20/17 1024 12/20/17 1310       Patient was given sequential compression devices, early ambulation, and chemoprophylaxis to prevent DVT.  Patient benefited maximally from  hospital stay and there were no complications.    Recent vital signs:  Patient Vitals for the past 24 hrs:  BP Temp Temp src Pulse Resp SpO2  12/22/17 0513 (!) 143/58 97.8 F (36.6 C) Oral 73 18 96 %  12/21/17 2025 138/68 98.5 F (36.9 C) Oral 68 16 98 %     Recent laboratory studies:  Recent Labs    12/21/17 0647 12/22/17 0621  WBC 18.4* 10.8*  HGB 12.6 11.3*  HCT 37.6 34.5*  PLT 258 201  NA 134*  --   K 4.4  --   CL 102  --   CO2 22  --   BUN 15  --   CREATININE 0.72  --   GLUCOSE 147*  --   CALCIUM 9.0  --      Discharge Medications:   Allergies as of 12/22/2017      Reactions   Benadryl [diphenhydramine Hcl] Hives, Rash   Bee Venom Other (See Comments)   UNSPECIFIED REACTION      Medication List    STOP taking these medications   meloxicam 15 MG tablet Commonly known as:  MOBIC     TAKE these medications   acitretin 25 MG capsule Commonly known as:  SORIATANE Take 1 capsule by mouth daily.   aspirin EC 325 MG tablet Take 1 tablet (325 mg total) by mouth 2 (two) times daily. What changed:    medication strength  how much to take  when to  take this   atorvastatin 20 MG tablet Commonly known as:  LIPITOR Take 20 mg by mouth daily.   celecoxib 200 MG capsule Commonly known as:  CELEBREX Take 1 capsule (200 mg total) by mouth 2 (two) times daily.   cyclobenzaprine 10 MG tablet Commonly known as:  FLEXERIL Take 10 mg by mouth at bedtime.   escitalopram 10 MG tablet Commonly known as:  LEXAPRO Take 5-10 mg by mouth See admin instructions. Take 50m for one week, then 153monce daily thereafter.   gabapentin 300 MG capsule Commonly known as:  NEURONTIN Take 1 capsule (300 mg total) by mouth 3 (three) times daily.   lisinopril 20 MG tablet Commonly known as:  PRINIVIL,ZESTRIL Take 40 mg by mouth daily.   metoprolol tartrate 50 MG tablet Commonly known as:  LOPRESSOR Take 50 mg by mouth daily.   multivitamin with minerals Tabs  tablet Take 2 tablets by mouth daily.   Oxycodone HCl 10 MG Tabs Take 1 tablet (10 mg total) by mouth every 6 (six) hours as needed. Max: 4/day Start taking on:  12/30/2017 What changed:  Another medication with the same name was changed. Make sure you understand how and when to take each.   Oxycodone HCl 10 MG Tabs Take 1 tablet (10 mg total) by mouth every 6 (six) hours as needed. Max: 4/day Start taking on:  01/30/2018 What changed:  Another medication with the same name was changed. Make sure you understand how and when to take each.   Oxycodone HCl 10 MG Tabs Take 1 tablet (10 mg total) by mouth every 6 (six) hours as needed for up to 5 days. Max: 4/day Start taking on:  03/01/2018 What changed:  These instructions start on 03/01/2018. If you are unsure what to do until then, ask your doctor or other care provider.   PROBIOTIC-10 PO Take 1 capsule by mouth at bedtime.   rOPINIRole 0.5 MG tablet Commonly known as:  REQUIP Take 1 mg by mouth at bedtime as needed. Restless legs   Self-Cath System Straight Tip Kit 1 Units by Does not apply route every 6 (six) hours as needed.     ASK your doctor about these medications   hydrOXYzine 25 MG tablet Commonly known as:  ATARAX/VISTARIL Take 25 mg by mouth 2 (two) times daily. Ask about: Should I take this medication?            Durable Medical Equipment  (From admission, onward)        Start     Ordered   12/20/17 1753  DME Walker rolling  Once    Question:  Patient needs a walker to treat with the following condition  Answer:  Status post total left knee replacement   12/20/17 1752   12/20/17 1753  DME 3 n 1  Once     12/20/17 1752       Discharge Care Instructions  (From admission, onward)        Start     Ordered   12/22/17 0000  Weight bearing as tolerated     12/22/17 1304      Diagnostic Studies: Dg Chest 2 View  Result Date: 12/09/2017 CLINICAL DATA:  Preop knee replacement history of hypertension EXAM:  CHEST - 2 VIEW COMPARISON:  12/25/2012 FINDINGS: No acute pulmonary infiltrate or effusion. Borderline cardiomegaly. No pneumothorax. Partially visualized lumbar stimulator leads. Additional ascending lead terminating at the midthoracic spine, similar compared to prior radiograph. IMPRESSION: No active cardiopulmonary disease.  Electronically Signed   By: Donavan Foil M.D.   On: 12/09/2017 16:01    Disposition: Discharge disposition: 01-Home or Self Care       Discharge Instructions    Call MD / Call 911   Complete by:  As directed    If you experience chest pain or shortness of breath, CALL 911 and be transported to the hospital emergency room.  If you develope a fever above 101 F, pus (white drainage) or increased drainage or redness at the wound, or calf pain, call your surgeon's office.   Constipation Prevention   Complete by:  As directed    Drink plenty of fluids.  Prune juice may be helpful.  You may use a stool softener, such as Colace (over the counter) 100 mg twice a day.  Use MiraLax (over the counter) for constipation as needed.   Diet - low sodium heart healthy   Complete by:  As directed    Discharge instructions   Complete by:  As directed    Pt was discharged with a urinary cath kit.  She states that she has used this before and is comfortable with self cath.  Call if you are not able to urinate on your own after 3 days.   Driving restrictions   Complete by:  As directed    No driving for 2 weeks   Increase activity slowly as tolerated   Complete by:  As directed    Patient may shower   Complete by:  As directed    You may shower without a dressing once there is no drainage.  Do not wash over the wound.  If drainage remains, cover wound with plastic wrap and then shower.   Weight bearing as tolerated   Complete by:  As directed       Follow-up Information    Frederik Pear, MD In 2 weeks.   Specialty:  Orthopedic Surgery Contact information: [place] [address] [phone]            Signed: Joanell Rising 12/22/2017, 1:06 PM

## 2017-12-22 NOTE — Plan of Care (Signed)
  Problem: Education: Goal: Knowledge of General Education information will improve Outcome: Progressing   Problem: Pain Managment: Goal: General experience of comfort will improve Outcome: Progressing   

## 2017-12-22 NOTE — Progress Notes (Addendum)
Physical Therapy Treatment Patient Details Name: Kendra Nelson MRN: [id number] DOB: 07/20/1961 Today's Date: 12/22/2017    History of Present Illness 57 y.o. female admitted on 12/20/17 for elective L TKA WBAT post op.  Pt with significant PMH of HTN, migraine, anxiety, neuromuscular disorder, skin abnormalaties, spinal cord stimulator, multiple lower back surgeries.     PT Comments    Seen again this PM and despite continued reports of pain and soreness, pt was able to do more physically with me this PM than in the AM.  We completed review of her HEP and practiced the stairs simulating home entry to ensure reinforcement of correct technique.   Pt plans to d/c home with family this PM. PT to follow acutely until d/c confirmed.     Follow Up Recommendations  Follow surgeon's recommendation for DC plan and follow-up therapies;Supervision for mobility/OOB     Equipment Recommendations  Rolling walker with 5" wheels;3in1 (PT)    Recommendations for Other Services   NA     Precautions / Restrictions Precautions Precautions: Knee Precaution Booklet Issued: Yes (comment) Precaution Comments: knee exercise handout given and reviewed.  Knee precaution reviewed Restrictions LLE Weight Bearing: Weight bearing as tolerated    Mobility  Bed Mobility Overal bed mobility: Modified Independent             General bed mobility comments: Pt able to get herself EOB today, but much slower than yesterday.  She is reporting a significant increase in her pain and using her good foot to hook and move her surgical foot.   Transfers Overall transfer level: Needs assistance Equipment used: Rolling walker (2 wheeled) Transfers: Sit to/from Stand Sit to Stand: Min guard         General transfer comment: Min guard assist for safety, cues to wait until therapist is ready  Ambulation/Gait Ambulation/Gait assistance: Min guard Ambulation Distance (Feet): 200 Feet Assistive device: Rolling  walker (2 wheeled) Gait Pattern/deviations: Step-through pattern;Antalgic     General Gait Details: Moderately antalgic gait pattern today, and speed is faster this PM.  Verbal cues for closer proximity to RW.    Stairs Stairs: Yes Stairs assistance: Supervision Stair Management: Two rails;Step to pattern;Forwards Number of Stairs: 5(x2) General stair comments: Verbal cues for correct LE sequencing and step to gait pattern.           Balance Overall balance assessment: Needs assistance Sitting-balance support: Feet supported;No upper extremity supported Sitting balance-Leahy Scale: Good     Standing balance support: No upper extremity supported Standing balance-Leahy Scale: Fair                              Cognition Arousal/Alertness: Awake/alert Behavior During Therapy: WFL for tasks assessed/performed Overall Cognitive Status: Within Functional Limits for tasks assessed                                        Exercises Total Joint Exercises Ankle Circles/Pumps: AROM;Both;20 reps Quad Sets: AROM;Left;10 reps Towel Squeeze: AROM;Both;10 reps Heel Slides: AAROM;Both;10 reps Long Arc Quad: AROM;Left;10 reps Knee Flexion: AROM;AAROM;Left;Seated;20 reps Goniometric ROM: 12-70        Pertinent Vitals/Pain Pain Assessment: 0-10 Faces Pain Scale: Hurts even more Pain Location: left knee Pain Descriptors / Indicators: Aching;Burning Pain Intervention(s): Limited activity within patient's tolerance;Monitored during session;Repositioned;Ice applied  PT Goals (current goals can now be found in the care plan section) Acute Rehab PT Goals Patient Stated Goal: be ready for graduation and summer (being more physically active- pool and trampoline, walking, biking.   Progress towards PT goals: Progressing toward goals    Frequency    7X/week      PT Plan Current plan remains appropriate       AM-PAC PT "6 Clicks" Daily  Activity  Outcome Measure  Difficulty turning over in bed (including adjusting bedclothes, sheets and blankets)?: A Little Difficulty moving from lying on back to sitting on the side of the bed? : A Little Difficulty sitting down on and standing up from a chair with arms (e.g., wheelchair, bedside commode, etc,.)?: A Little Help needed moving to and from a bed to chair (including a wheelchair)?: A Little Help needed walking in hospital room?: A Little Help needed climbing 3-5 steps with a railing? : A Little 6 Click Score: 18    End of Session   Activity Tolerance: Patient limited by pain Patient left: in chair;with call bell/phone within reach;with chair alarm set   PT Visit Diagnosis: Muscle weakness (generalized) (M62.81);Difficulty in walking, not elsewhere classified (R26.2);Pain Pain - Right/Left: Left Pain - part of body: Knee     Time: 3536-1443 PT Time Calculation (min) (ACUTE ONLY): 15 min  Charges:  $Gait Training: 8-22 mins          Mckynlie Vanderslice B. Cloyd Ragas, PT, DPT [phone]            12/22/2017, 5:40 PM

## 2017-12-22 NOTE — Progress Notes (Signed)
Pt states she feels the urge to void but unable to start stream. Bladder scan shows , I&O cath returns . Pt voiced feeling of relief post cath. Notified o/c Dannielle Burn, [place]) about patients inability to urinate.No new orders received, instructed to continue PRN caths

## 2017-12-23 ENCOUNTER — Telehealth: Payer: Self-pay

## 2017-12-23 ENCOUNTER — Telehealth: Payer: Self-pay | Admitting: Pain Medicine

## 2017-12-23 NOTE — Telephone Encounter (Signed)
Spoke with pharmacist at [facility].  States patient received oxycodone from knee surgeon.  Pharmacy states if she fills these, she may have a problem filling our script for oxycodone.  Attempted to call patient to inform her and see if she would call her knee surgeon and try to get her pain med changed to a different medication so that it is not an issue getting filled.

## 2017-12-23 NOTE — Telephone Encounter (Signed)
Patient husband returned call.  Explained situation.  He states he will try to get knee surgeon to change medication.

## 2017-12-23 NOTE — Telephone Encounter (Signed)
Patient had knee replacement and received script for pain meds. Pharmacy needs permission to fill script. Please call pharmacy

## 2017-12-25 LAB — AEROBIC/ANAEROBIC CULTURE W GRAM STAIN (SURGICAL/DEEP WOUND): Culture: NO GROWTH

## 2017-12-25 LAB — AEROBIC/ANAEROBIC CULTURE (SURGICAL/DEEP WOUND)

## 2018-02-21 ENCOUNTER — Other Ambulatory Visit: Payer: Self-pay

## 2018-02-21 MED ORDER — PAIN MANAGEMENT IT PUMP REFILL: 1.0000 | Freq: Once | INTRATHECAL | 0 refills | Status: AC

## 2018-03-16 ENCOUNTER — Ambulatory Visit: Payer: Medicare Other | Attending: Nurse Practitioner | Admitting: Nurse Practitioner

## 2018-03-16 ENCOUNTER — Encounter: Payer: Self-pay | Admitting: Nurse Practitioner

## 2018-03-16 ENCOUNTER — Other Ambulatory Visit: Payer: Self-pay

## 2018-03-16 DIAGNOSIS — Z791 Long term (current) use of non-steroidal anti-inflammatories (NSAID): Secondary | ICD-10-CM | POA: Diagnosis not present

## 2018-03-16 DIAGNOSIS — Z91030 Bee allergy status: Secondary | ICD-10-CM | POA: Insufficient documentation

## 2018-03-16 DIAGNOSIS — G894 Chronic pain syndrome: Secondary | ICD-10-CM

## 2018-03-16 DIAGNOSIS — Z79899 Other long term (current) drug therapy: Secondary | ICD-10-CM | POA: Diagnosis not present

## 2018-03-16 DIAGNOSIS — Z888 Allergy status to other drugs, medicaments and biological substances status: Secondary | ICD-10-CM | POA: Insufficient documentation

## 2018-03-16 DIAGNOSIS — Z79891 Long term (current) use of opiate analgesic: Secondary | ICD-10-CM | POA: Insufficient documentation

## 2018-03-16 DIAGNOSIS — Z96652 Presence of left artificial knee joint: Secondary | ICD-10-CM | POA: Insufficient documentation

## 2018-03-16 DIAGNOSIS — M549 Dorsalgia, unspecified: Secondary | ICD-10-CM | POA: Diagnosis present

## 2018-03-16 DIAGNOSIS — Z7982 Long term (current) use of aspirin: Secondary | ICD-10-CM | POA: Diagnosis not present

## 2018-03-16 MED ORDER — OXYCODONE HCL 10 MG PO TABS: 10.0000 mg | ORAL_TABLET | Freq: Four times a day (QID) | ORAL | 0 refills | Status: DC | PRN

## 2018-03-16 NOTE — Patient Instructions (Addendum)
_____You have been given  3 scripts for oxycodone today_______________________________________________________________________________________  Medication Rules  Applies to: All patients receiving prescriptions (written or electronic).  Pharmacy of record: Pharmacy where electronic prescriptions will be sent. If written prescriptions are taken to a different pharmacy, please inform the nursing staff. The pharmacy listed in the electronic medical record should be the one where you would like electronic prescriptions to be sent.  Prescription refills: Only during scheduled appointments. Applies to both, written and electronic prescriptions.  NOTE: The following applies primarily to controlled substances (Opioid* Pain Medications).   Patient's responsibilities: 1. Pain Pills: Bring all pain pills to every appointment (except for procedure appointments). 2. Pill Bottles: Bring pills in original pharmacy bottle. Always bring newest bottle. Bring bottle, even if empty. 3. Medication refills: You are responsible for knowing and keeping track of what medications you need refilled. The day before your appointment, write a list of all prescriptions that need to be refilled. Bring that list to your appointment and give it to the admitting nurse. Prescriptions will be written only during appointments. If you forget a medication, it will not be "Called in", "Faxed", or "electronically sent". You will need to get another appointment to get these prescribed. 4. Prescription Accuracy: You are responsible for carefully inspecting your prescriptions before leaving our office. Have the discharge nurse carefully go over each prescription with you, before taking them home. Make sure that your name is accurately spelled, that your address is correct. Check the name and dose of your medication to make sure it is accurate. Check the number of pills, and the written instructions to make sure they are clear and accurate. Make  sure that you are given enough medication to last until your next medication refill appointment. 5. Taking Medication: Take medication as prescribed. Never take more pills than instructed. Never take medication more frequently than prescribed. Taking less pills or less frequently is permitted and encouraged, when it comes to controlled substances (written prescriptions).  6. Inform other Doctors: Always inform, all of your healthcare providers, of all the medications you take. 7. Pain Medication from other Providers: You are not allowed to accept any additional pain medication from any other Doctor or Healthcare provider. There are two exceptions to this rule. (see below) In the event that you require additional pain medication, you are responsible for notifying us, as stated below. 8. Medication Agreement: You are responsible for carefully reading and following our Medication Agreement. This must be signed before receiving any prescriptions from our practice. Safely store a copy of your signed Agreement. Violations to the Agreement will result in no further prescriptions. (Additional copies of our Medication Agreement are available upon request.) 9. Laws, Rules, & Regulations: All patients are expected to follow all [address] and [employer], Rules,  [place]. Ignorance of the Laws does not constitute a valid excuse. The use of any illegal substances is prohibited. 10. Adopted CDC guidelines & recommendations: Target dosing levels will be at or below 60 MME/day. Use of benzodiazepines** is not recommended.  Exceptions: There are only two exceptions to the rule of not receiving pain medications from other Healthcare Providers. 1. Exception #1 (Emergencies): In the event of an emergency (i.e.: accident requiring emergency care), you are allowed to receive additional pain medication. However, you are responsible for: As soon as you are able, call our office [phone], at any time of the day or  night, and leave a message stating your name, the date and nature of the  emergency, and the name and dose of the medication prescribed. In the event that your call is answered by a member of our staff, make sure to document and save the date, time, and the name of the person that took your information.  2. Exception #2 (Planned Surgery): In the event that you are scheduled by another doctor or dentist to have any type of surgery or procedure, you are allowed (for a period no longer than 30 days), to receive additional pain medication, for the acute post-op pain. However, in this case, you are responsible for picking up a copy of our "Post-op Pain Management for Surgeons" handout, and giving it to your surgeon or dentist. This document is available at our office, and does not require an appointment to obtain it. Simply go to our office during business hours (Monday-Thursday from 8:00 AM to 4:00 PM) (Friday 8:00 AM to 12:00 Noon) or if you have a scheduled appointment with [place], prior to your surgery, and ask for it by name. In addition, you will need to provide [place] with your name, name of your surgeon, type of surgery, and date of procedure or surgery.  *Opioid medications include: morphine, codeine, oxycodone, oxymorphone, hydrocodone, hydromorphone, meperidine, tramadol, tapentadol, buprenorphine, fentanyl, methadone. **Benzodiazepine medications include: diazepam (Valium), alprazolam (Xanax), clonazepam (Klonopine), lorazepam (Ativan), clorazepate (Tranxene), chlordiazepoxide (Librium), estazolam (Prosom), oxazepam (Serax), temazepam (Restoril), triazolam (Halcion) (Last updated: 10/28/2017) ____________________________________________________________________________________________    Opioid Overdose Opioids are substances that relieve pain by binding to pain receptors in your brain and spinal cord. Opioids include illegal drugs, such as heroin, as well as prescription pain medicines.An opioid overdose  happens when you take too much of an opioid substance. This can happen with any type of opioid, including:  Heroin.  Morphine.  Codeine.  Methadone.  Oxycodone.  Hydrocodone.  Fentanyl.  Hydromorphone.  Buprenorphine.  The effects of an overdose can be mild, dangerous, or even deadly. Opioid overdose is a medical emergency. What are the causes? This condition may be caused by:  Taking too much of an opioid by accident.  Taking too much of an opioid on purpose.  An error made by a health care provider who prescribes a medicine.  An error made by the pharmacist who fills the prescription order.  Using more than one substance that contains opioids at the same time.  Mixing an opioid with a substance that affects your heart, breathing, or blood pressure. These include alcohol, tranquilizers, sleeping pills, illegal drugs, and some over-the-counter medicines.  What increases the risk? This condition is more likely in:  Children. They may be attracted to colorful pills. Because of a child's small size, even a small amount of a drug can be dangerous.  Elderly people. They may be taking many different drugs. Elderly people may have difficulty reading labels or remembering when they last took their medicine.  People who take an opioid on a long-term basis.  People who use: ? Illegal drugs. ? Other substances, including alcohol, while using an opioid.  People who have: ? A history of drug or alcohol abuse. ? Certain mental health conditions.  People who take opioids that are not prescribed for them.  What are the signs or symptoms? Symptoms of this condition depend on the type of opioid and the amount that was taken. Common symptoms include:  Sleepiness or difficulty waking from sleep.  Confusion.  Slurred speech.  Slowed breathing and a slow pulse.  Nausea and vomiting.  Abnormally small pupils.  Signs and symptoms  that require emergency treatment  include:  Cold, clammy, and pale skin.  Blue lips and fingernails.  Vomiting.  Gurgling sounds in the throat.  A pulse that is very slow or difficult to detect.  Breathing that is very slow, noisy, or difficult to detect.  Limp body.  Inability to respond to speech or be awakened from sleep (stupor).  How is this diagnosed? This condition is diagnosed based on your symptoms. It is important to tell your health care provider:  All of the opioidsthat you took.  When you took the opioids.  Whether you were drinking alcohol or using other substances.  Your health care provider will do a physical exam. This exam may include:  Checking and monitoring your heart rate and rhythm, your breathing rate and depth, your temperature, and your blood pressure (vital signs).  Checking for abnormally small pupils.  Measuring oxygen levels in your blood.  You may also have blood tests or urine tests. How is this treated? Supporting your vital signs and your breathing is the first step in treating an opioid overdose. Treatment may also include:  Giving fluids and minerals (electrolytes) through an IV tube.  Inserting a breathing tube (endotracheal tube) in your airway to help you breathe.  Giving oxygen.  Passing a tube through your nose and into your stomach (NG tube, or nasogastric tube) to wash out your stomach.  Giving medicines that: ? Increase your blood pressure. ? Absorb any opioid that is in your digestive system. ? Reverse the effects of the opioid (naloxone).  Ongoing counseling and mental health support if you intentionally overdosed or used an illegal drug.  Follow these instructions at home:  Take over-the-counter and prescription medicines only as told by your health care provider. Always ask your health care provider about possible side effects and interactions of any new medicine that you start taking.  Keep a list of all of the medicines that you take,  including over-the-counter medicines. Bring this list with you to all of your medical visits.  Drink enough fluid to keep your urine clear or pale yellow.  Keep all follow-up visits as told by your health care provider. This is important. How is this prevented?  Get help if you are struggling with: ? Alcohol or drug use. ? Depression or another mental health problem.  Keep the phone number of your local poison control center near your phone or on your cell phone.  Store all medicines in safety containers that are out of the reach of children.  Read the drug inserts that come with your medicines.  Do not drink alcohol when taking opioids.  Do not use illegal drugs.  Do not take opioid medicines that are not prescribed for you. Contact a health care provider if:  Your symptoms return.  You develop new symptoms or side effects when you are taking medicines. Get help right away if:  You think that you or someone else may have taken too much of an opioid. The hotline of the [facility] is [phone].  You or someone else is having symptoms of an opioid overdose.  You have serious thoughts about hurting yourself or others.  You have: ? Chest pain. ? Difficulty breathing. ? A loss of consciousness. Opioid overdose is an emergency. Do not wait to see if the symptoms will go away. Get medical help right away. Call your local emergency services (911 in the [place] Do not drive yourself to the hospital. This information is  not intended to replace advice given to you by your health care provider. Make sure you discuss any questions you have with your health care provider. Document Released: 09/24/2004 Document Revised: 01/23/2016 Document Reviewed: 01/31/2015 Elsevier Interactive Patient Education  [employer].

## 2018-03-16 NOTE — Progress Notes (Signed)
Nursing Pain Medication Assessment:  Safety precautions to be maintained throughout the outpatient stay will include: orient to surroundings, keep bed in low position, maintain call bell within reach at all times, provide assistance with transfer out of bed and ambulation.  Medication Inspection Compliance: Pill count conducted under aseptic conditions, in front of the patient. Neither the pills nor the bottle was removed from the patient's sight at any time. Once count was completed pills were immediately returned to the patient in their original bottle.  Medication: Oxycodone IR Pill/Patch Count: 60 of 120 pills remain Pill/Patch Appearance: Markings consistent with prescribed medication Bottle Appearance: Standard pharmacy container. Clearly labeled. Filled Date:07/ 02 / 2019 Last Medication intake:  Today

## 2018-03-16 NOTE — Progress Notes (Signed)
Patient's Name: Kendra Nelson  MRN: [id number]  Referring Provider: No ref. provider found  DOB: Mar 23, 1961  PCP: System, Pcp Not In  DOS: 03/16/2018  Note by: Dionisio David, NP  Service setting: Ambulatory outpatient  Specialty: Interventional Pain Management  Patient type: Established  Location: [facility] (AMB) Pain Management Facility  Visit type: Interventional Procedure   Primary Reason for Visit: Interventional Pain Management Treatment. CC: Back Pain (low)  Procedure:  Intrathecal Drug Delivery System (IDDS):  Type: Reservoir Refill [phone])       Region: Abdominal Laterality:          Type of Pump: Medtronic Synchromed II (MRI-compatible) Delivery Route: Intrathecal Type of Pain Treated: Neuropathic/Nociceptive Primary Medication Class: Opioid/opiate  Medication, Concentration, Infusion Program, & Delivery Rate: Please see scanned programming printout.  Indications: 1. Chronic pain syndrome    Pain Assessment: Self-Reported Pain Score: 4 /10             Reported level is compatible with observation.        Intrathecal Pump Therapy Assessment  Manufacturer: Medtronic Synchromed II Type: Programmable Volume: 40 mL reservoir MRI compatibility: Yes   Drug content:  Primary Medication Class: Opioid Primary Medication: PF-Fentanyl Secondary Medication: see pump readout Other Medication: see pump readout   Programming:  Type: Simple continuous. See pump readout for details.   Changes:  Medication Change: None at this point Rate Change: No change in rate  Reported side-effects or adverse reactions: None reported  Effectiveness: Described as relatively effective, allowing for increase in activities of daily living (ADL) Clinically meaningful improvement in function (CMIF): Sustained CMIF goals met  Plan: Pump refill today  Pre-op Assessment:  Ms. Stigall is a 57 y.o. (year old), female patient, seen today for interventional treatment. 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 Ms. Caputo has a current medication list which includes the following prescription(s): acitretin, atorvastatin, cyclobenzaprine, gabapentin, lisinopril, metoprolol tartrate, ropinirole, aspirin ec, self-cath system straight tip, celecoxib, escitalopram, multivitamin with minerals, oxycodone hcl, oxycodone hcl, oxycodone hcl, and probiotic product. Her primarily concern today is the Back Pain (low)  Initial Vital Signs:  Pulse/HCG Rate: (!) 58  Temp: 98.4 F (36.9 C) Resp: 16 BP: (!) 149/82 SpO2: 100 %  BMI: Estimated body mass index is 25.69 kg/m as calculated from the following:   Height as of this encounter: _0  (1.6 m).   Weight as of this encounter: 145 lb (65.8 kg).  Risk Assessment: Allergies: Reviewed. She is allergic to benadryl [diphenhydramine hcl] and bee venom.  Allergy Precautions: None required Coagulopathies: Reviewed. None identified.  Blood-thinner therapy: None at this time Active Infection(s): Reviewed. None identified. Ms. Bowring is afebrile  Site Confirmation: Ms. Pellot was asked to confirm the procedure and laterality before marking the site Procedure checklist: Completed Consent: Before the procedure and under the influence of no sedative(s), amnesic(s), or anxiolytics, the patient was informed of the treatment options, risks and possible complications. To fulfill our ethical and legal obligations, as recommended by the American Medical Association's Code of Ethics, I have informed the patient of my clinical impression; the nature and purpose of the treatment or procedure; the risks, benefits, and possible complications of the intervention; the alternatives, including doing nothing; the risk(s)  and benefit(s) of the alternative treatment(s) or procedure(s); and the risk(s) and benefit(s)  of doing nothing.  Ms. Dowson was provided with information about the general risks and possible complications associated with most interventional procedures. These include, but are not limited to: failure to achieve desired goals, infection, bleeding, organ or nerve damage, allergic reactions, paralysis, and/or death.  In addition, she was informed of those risks and possible complications associated to this particular procedure, which include, but are not limited to: damage to the implant; failure to decrease pain; local, systemic, or serious CNS infections, intraspinal abscess with possible cord compression and paralysis, or life-threatening such as meningitis; bleeding; organ damage; nerve injury or damage with subsequent sensory, motor, and/or autonomic system dysfunction, resulting in transient or permanent pain, numbness, and/or weakness of one or several areas of the body; allergic reactions, either minor or major life-threatening, such as anaphylactic or anaphylactoid reactions.  Furthermore, Ms. Remmert was informed of those risks and complications associated with the medications. These include, but are not limited to: allergic reactions (i.e.: anaphylactic or anaphylactoid reactions); endorphine suppression; bradycardia and/or hypotension; water retention and/or peripheral vascular relaxation leading to lower extremity edema and possible stasis ulcers; respiratory depression and/or shortness of breath; decreased metabolic rate leading to weight gain; swelling or edema; medication-induced neural toxicity; particulate matter embolism and blood vessel occlusion with resultant organ, and/or nervous system infarction; and/or intrathecal granuloma formation with possible spinal cord compression and permanent paralysis.  Before refilling the pump Ms. Slabach was informed that some of the medications used  in the devise may not be FDA approved for such use and therefore it constitutes an off-label use of the medications.  Finally, she was informed that Medicine is not an exact science; therefore, there is also the possibility of unforeseen or unpredictable risks and/or possible complications that may result in a catastrophic outcome. The patient indicated having understood very clearly. We have given the patient no guarantees and we have made no promises. Enough time was given to the patient to ask questions, all of which were answered to the patient's satisfaction. Ms. Lamphear has indicated that she wanted to continue with the procedure. Attestation: I, the ordering provider, attest that I have discussed with the patient the benefits, risks, side-effects, alternatives, likelihood of achieving goals, and potential problems during recovery for the procedure that I have provided informed consent. Date  Time: 03/16/2018 11:36 AM  Pre-Procedure Preparation:  Monitoring: As per clinic protocol. Respiration, ETCO2, SpO2, BP, heart rate and rhythm monitor placed and checked for adequate function Safety Precautions: Patient was assessed for positional comfort and pressure points before starting the procedure. Time-out: I initiated and conducted the "Time-out" before starting the procedure, as per protocol. The patient was asked to participate by confirming the accuracy of the "Time Out" information. Verification of the correct person, site, and procedure were performed and confirmed by me, the nursing staff, and the patient. "Time-out" conducted as per Joint Commission's Universal Protocol (UP.01.01.01). Time:    Description of Procedure Process:   Position: Supine Target Area: Central-port of intrathecal pump. Approach: Anterior, 90 degree angle approach. Area Prepped: Entire Area around the pump implant. Prepping solution: ChloraPrep (2% chlorhexidine gluconate and 70% isopropyl alcohol) Safety  Precautions: Aspiration looking for blood return was conducted prior to all injections. At no point did we inject any substances, as a needle was being advanced. No attempts were made at seeking any paresthesias. Safe injection practices and needle disposal techniques used. Medications properly checked for expiration dates. SDV (single dose vial) medications used. Description of the Procedure: Protocol guidelines were  followed. Two nurses trained to do implant refills were present during the entire procedure. The refill medication was checked by both healthcare providers as well as the patient. The patient was included in the "Time-out" to verify the medication. The patient was placed in position. The pump was identified. The area was prepped in the usual manner. The sterile template was positioned over the pump, making sure the side-port location matched that of the pump. Both, the pump and the template were held for stability. The needle provided in the Medtronic Kit was then introduced thru the center of the template and into the central port. The pump content was aspirated and discarded volume documented. The new medication was slowly infused into the pump, thru the filter, making sure to avoid overpressure of the device. The needle was then removed and the area cleansed, making sure to leave some of the prepping solution back to take advantage of its long term bactericidal properties. The pump was interrogated and programmed to reflect the correct medication, volume, and dosage. The program was printed and taken to the physician for approval. Once checked and signed by the physician, a copy was provided to the patient and another scanned into the EMR. Vitals:   03/16/18 1135  BP: (!) 149/82  Pulse: (!) 58  Resp: 16  Temp: 98.4 F (36.9 C)  SpO2: 100%  Weight: 145 lb (65.8 kg)  Height: _0  (1.6 m)    Start Time:   hrs. End Time:   hrs. Materials & Medications: Medtronic Refill  Kit Medication(s): Please see chart orders for details.  Imaging Guidance:          Type of Imaging Technique: None used Indication(s): N/A Exposure Time: No patient exposure Contrast: None used. Fluoroscopic Guidance: N/A Ultrasound Guidance: N/A Interpretation: N/A  Antibiotic Prophylaxis:   Anti-infectives (From admission, onward)   None     Indication(s): None identified  Post-operative Assessment:  Post-procedure Vital Signs:  Pulse/HCG Rate: (!) 58  Temp: 98.4 F (36.9 C) Resp: 16 BP: (!) 149/82 SpO2: 100 %  EBL: None  Complications: No immediate post-treatment complications observed by team, or reported by patient.  Note: The patient tolerated the entire procedure well. A repeat set of vitals were taken after the procedure and the patient was kept under observation following institutional policy, for this type of procedure. Post-procedural neurological assessment was performed, showing return to baseline, prior to discharge. The patient was provided with post-procedure discharge instructions, including a section on how to identify potential problems. Should any problems arise concerning this procedure, the patient was given instructions to immediately contact us, at any time, without hesitation. In any case, we plan to contact the patient by telephone for a follow-up status report regarding this interventional procedure.  Comments:  No additional relevant information.  Controlled Substance Pharmacotherapy Assessment REMS (Risk Evaluation and Mitigation Strategy)  Analgesic: Oxycodone IR 10 mg 5x/day (64m/dayof oxycodone) (75MME/day) (04/18/2017) + intrathecal pump medication (fentanyl) (825 MME/day) (915MME/day) Highest recorded MME/day: (915MME/day) MME/day: (900MME/day)    TDewayne Shorter RN  03/16/2018 11:42 AM  Signed Nursing Pain Medication Assessment:  Safety precautions to be maintained throughout the outpatient stay will include: orient to  surroundings, keep bed in low position, maintain call bell within reach at all times, provide assistance with transfer out of bed and ambulation.  Medication Inspection Compliance: Pill count conducted under aseptic conditions, in front of the patient. Neither the pills nor the bottle was removed from the patient's sight at any  time. Once count was completed pills were immediately returned to the patient in their original bottle.  Medication: Oxycodone IR Pill/Patch Count: 60 of 120 pills remain Pill/Patch Appearance: Markings consistent with prescribed medication Bottle Appearance: Standard pharmacy container. Clearly labeled. Filled Date:07/ 02 / 2019 Last Medication intake:  Today Pharmacokinetics: Liberation and absorption (onset of action): WNL Distribution (time to peak effect): WNL Metabolism and excretion (duration of action): WNL         Pharmacodynamics: Desired effects: Analgesia: Ms. Terrance reports >50% benefit. Functional ability: Patient reports that medication allows her to accomplish basic ADLs Clinically meaningful improvement in function (CMIF): Sustained CMIF goals met Perceived effectiveness: Described as relatively effective, allowing for increase in activities of daily living (ADL) Undesirable effects: Side-effects or Adverse reactions: None reported Monitoring: [place] PMP: Online review of the past 61-monthperiod conducted. Compliant with practice rules and regulations Last UDS on record: Summary  Date Value Ref Range Status  09/29/2017 FINAL  Final    Comment:    ==================================================================== TOXASSURE SELECT 13 (MW) ==================================================================== Test                             Result       Flag       Units Drug Present and Declared for Prescription Verification   Oxymorphone                    66           EXPECTED   ng/mg creat   Noroxycodone                   65            EXPECTED   ng/mg creat    Oxymorphone and noroxycodone are expected metabolites of    oxycodone. Sources of oxycodone are scheduled prescription    medications. Oxymorphone is also available as a scheduled    prescription medication. Drug Present not Declared for Prescription Verification   Fentanyl                       24           UNEXPECTED ng/mg creat   Norfentanyl                    73           UNEXPECTED ng/mg creat    Source of fentanyl is a scheduled prescription medication,    including IV, patch, and transmucosal formulations. Norfentanyl    is an expected metabolite of fentanyl. Drug Absent but Declared for Prescription Verification   Oxycodone                      Not Detected UNEXPECTED ng/mg creat    Oxycodone is almost always present in patients taking this drug    consistently.  Absence of oxycodone could be due to lapse of time    since the last dose or unusual pharmacokinetics (rapid    metabolism). ==================================================================== Test                      Result    Flag   Units      Ref Range   Creatinine              119  mg/dL      >=20 ==================================================================== Declared Medications:  The flagging and interpretation on this report are based on the  following declared medications.  Unexpected results may arise from  inaccuracies in the declared medications.  **Note: The testing scope of this panel includes these medications:  Oxycodone  **Note: The testing scope of this panel does not include following  reported medications:  Acitretin  Aspirin  Atorvastatin  Cyclobenzaprine  Lisinopril  Meloxicam  Multivitamin  Propranolol ==================================================================== For clinical consultation, please call [phone]. ====================================================================    UDS interpretation: Compliant           Medication Assessment Form: Reviewed. Patient indicates being compliant with therapy Treatment compliance: Compliant Risk Assessment Profile: Aberrant behavior: See prior evaluations. None observed or detected today Comorbid factors increasing risk of overdose: See prior notes. No additional risks detected today Risk of substance use disorder (SUD): Low Opioid Risk Tool - 03/16/18 1140      Family History of Substance Abuse   Alcohol  Negative    Illegal Drugs  Positive Female    Rx Drugs  Negative      Personal History of Substance Abuse   Alcohol  Negative    Illegal Drugs  Negative    Rx Drugs  Negative      Age   Age between 19-45 years   No      Psychological Disease   Psychological Disease  Negative    Depression  Negative      Total Score   Opioid Risk Tool Scoring  2    Opioid Risk Interpretation  Low Risk      ORT Scoring interpretation table:  Score <3 = Low Risk for SUD  Score between 4-7 = Moderate Risk for SUD  Score >8 = High Risk for Opioid Abuse   Risk Mitigation Strategies:  Patient Counseling: Covered Patient-Prescriber Agreement (PPA): Present and active  Notification to other healthcare providers: Done  Pharmacologic Plan: No change in therapy, at this time.             Assessment  Primary Diagnosis & Pertinent Problem List: The encounter diagnosis was Chronic pain syndrome.  Status Diagnosis  Controlled Controlled Controlled 1. Chronic pain syndrome     Problems updated and reviewed during this visit: No problems updated. Plan of Care   Imaging Orders  No imaging studies ordered today   Procedure Orders    No procedure(s) ordered today    Medications ordered for procedure: No orders of the defined types were placed in this encounter.  Medications administered: Hassan Rowan A. Schmierer had no medications administered during this visit.  See the medical record for exact dosing, route, and time of administration.  New Prescriptions    No medications on file   Disposition: Discharge home  Discharge Date & Time: 03/16/2018;   hrs.   Physician-requested Follow-up: Return in about 3 months (around 06/16/2018) for Pump refill based on programming.  No future appointments. Primary Care Physician: System, Pcp Not In Location: [facility] Note by: Dionisio David, NP Date: 03/16/2018; Time: 11:52 AM  Disclaimer:  Medicine is not an exact science. The only guarantee in medicine is that nothing is guaranteed. It is important to note that the decision to proceed with this intervention was based on the information collected from the patient. The Data and conclusions were drawn from the patient's questionnaire, the interview, and the physical examination. Because the information was provided in large part by the patient,  it cannot be guaranteed that it has not been purposely or unconsciously manipulated. Every effort has been made to obtain as much relevant data as possible for this evaluation. It is important to note that the conclusions that lead to this procedure are derived in large part from the available data. Always take into account that the treatment will also be dependent on availability of resources and existing treatment guidelines, considered by other Pain Management Practitioners as being common knowledge and practice, at the time of the intervention. For Medico-Legal purposes, it is also important to point out that variation in procedural techniques and pharmacological choices are the acceptable norm. The indications, contraindications, technique, and results of the above procedure should only be interpreted and judged by a Board-Certified Interventional Pain Specialist with extensive familiarity and expertise in the same exact procedure and technique.

## 2018-03-17 ENCOUNTER — Telehealth: Payer: Self-pay

## 2018-03-17 MED FILL — Medication: INTRATHECAL | Qty: 1 | Status: AC

## 2018-03-17 NOTE — Telephone Encounter (Signed)
Post pump refill.  Left message

## 2018-05-26 ENCOUNTER — Other Ambulatory Visit: Payer: Self-pay

## 2018-05-26 MED ORDER — PAIN MANAGEMENT IT PUMP REFILL: 1.0000 | Freq: Once | INTRATHECAL | 0 refills | Status: AC

## 2018-06-15 ENCOUNTER — Encounter: Payer: Self-pay | Admitting: Nurse Practitioner

## 2018-06-15 ENCOUNTER — Ambulatory Visit: Payer: Medicare Other | Attending: Nurse Practitioner | Admitting: Nurse Practitioner

## 2018-06-15 VITALS — BP 164/80 | HR 61 | Temp 98.2°F | Resp 16 | Ht 63.0 in | Wt 150.0 lb

## 2018-06-15 DIAGNOSIS — Z96652 Presence of left artificial knee joint: Secondary | ICD-10-CM | POA: Insufficient documentation

## 2018-06-15 DIAGNOSIS — Z969 Presence of functional implant, unspecified: Secondary | ICD-10-CM | POA: Insufficient documentation

## 2018-06-15 DIAGNOSIS — M545 Low back pain: Secondary | ICD-10-CM | POA: Diagnosis present

## 2018-06-15 DIAGNOSIS — Z451 Encounter for adjustment and management of infusion pump: Secondary | ICD-10-CM

## 2018-06-15 DIAGNOSIS — M5417 Radiculopathy, lumbosacral region: Secondary | ICD-10-CM | POA: Diagnosis not present

## 2018-06-15 DIAGNOSIS — M5136 Other intervertebral disc degeneration, lumbar region: Secondary | ICD-10-CM | POA: Insufficient documentation

## 2018-06-15 DIAGNOSIS — Z978 Presence of other specified devices: Secondary | ICD-10-CM

## 2018-06-15 DIAGNOSIS — M51369 Other intervertebral disc degeneration, lumbar region without mention of lumbar back pain or lower extremity pain: Secondary | ICD-10-CM

## 2018-06-15 DIAGNOSIS — Z888 Allergy status to other drugs, medicaments and biological substances status: Secondary | ICD-10-CM | POA: Diagnosis not present

## 2018-06-15 DIAGNOSIS — G894 Chronic pain syndrome: Secondary | ICD-10-CM | POA: Diagnosis not present

## 2018-06-15 DIAGNOSIS — M961 Postlaminectomy syndrome, not elsewhere classified: Secondary | ICD-10-CM

## 2018-06-15 DIAGNOSIS — Z79891 Long term (current) use of opiate analgesic: Secondary | ICD-10-CM

## 2018-06-15 MED ORDER — OXYCODONE HCL 10 MG PO TABS: 10.0000 mg | ORAL_TABLET | Freq: Four times a day (QID) | ORAL | 0 refills | Status: DC | PRN

## 2018-06-15 NOTE — Progress Notes (Signed)
Nursing Pain Medication Assessment:  Safety precautions to be maintained throughout the outpatient stay will include: orient to surroundings, keep bed in low position, maintain call bell within reach at all times, provide assistance with transfer out of bed and ambulation.  Medication Inspection Compliance: Pill count conducted under aseptic conditions, in front of the patient. Neither the pills nor the bottle was removed from the patient's sight at any time. Once count was completed pills were immediately returned to the patient in their original bottle.  Medication: Oxycodone IR Pill/Patch Count: 64 of 120 pills remain Pill/Patch Appearance: Markings consistent with prescribed medication Bottle Appearance: Standard pharmacy container. Clearly labeled. Filled Date: 09 / 30 / 2019 Last Medication intake:  Today

## 2018-06-15 NOTE — Patient Instructions (Signed)
____________________________________________________________________________________________  Medication Rules  Applies to: All patients receiving prescriptions (written or electronic).  Pharmacy of record: Pharmacy where electronic prescriptions will be sent. If written prescriptions are taken to a different pharmacy, please inform the nursing staff. The pharmacy listed in the electronic medical record should be the one where you would like electronic prescriptions to be sent.  Prescription refills: Only during scheduled appointments. Applies to both, written and electronic prescriptions.  NOTE: The following applies primarily to controlled substances (Opioid* Pain Medications).   Patient's responsibilities: 1. Pain Pills: Bring all pain pills to every appointment (except for procedure appointments). 2. Pill Bottles: Bring pills in original pharmacy bottle. Always bring newest bottle. Bring bottle, even if empty. 3. Medication refills: You are responsible for knowing and keeping track of what medications you need refilled. The day before your appointment, write a list of all prescriptions that need to be refilled. Bring that list to your appointment and give it to the admitting nurse. Prescriptions will be written only during appointments. If you forget a medication, it will not be "Called in", "Faxed", or "electronically sent". You will need to get another appointment to get these prescribed. 4. Prescription Accuracy: You are responsible for carefully inspecting your prescriptions before leaving our office. Have the discharge nurse carefully go over each prescription with you, before taking them home. Make sure that your name is accurately spelled, that your address is correct. Check the name and dose of your medication to make sure it is accurate. Check the number of pills, and the written instructions to make sure they are clear and accurate. Make sure that you are given enough medication to last  until your next medication refill appointment. 5. Taking Medication: Take medication as prescribed. Never take more pills than instructed. Never take medication more frequently than prescribed. Taking less pills or less frequently is permitted and encouraged, when it comes to controlled substances (written prescriptions).  6. Inform other Doctors: Always inform, all of your healthcare providers, of all the medications you take. 7. Pain Medication from other Providers: You are not allowed to accept any additional pain medication from any other Doctor or Healthcare provider. There are two exceptions to this rule. (see below) In the event that you require additional pain medication, you are responsible for notifying us, as stated below. 8. Medication Agreement: You are responsible for carefully reading and following our Medication Agreement. This must be signed before receiving any prescriptions from our practice. Safely store a copy of your signed Agreement. Violations to the Agreement will result in no further prescriptions. (Additional copies of our Medication Agreement are available upon request.) 9. Laws, Rules, & Regulations: All patients are expected to follow all Federal and State Laws, Statutes, Rules, & Regulations. Ignorance of the Laws does not constitute a valid excuse. The use of any illegal substances is prohibited. 10. Adopted CDC guidelines & recommendations: Target dosing levels will be at or below 60 MME/day. Use of benzodiazepines** is not recommended.  Exceptions: There are only two exceptions to the rule of not receiving pain medications from other Healthcare Providers. 1. Exception #1 (Emergencies): In the event of an emergency (i.e.: accident requiring emergency care), you are allowed to receive additional pain medication. However, you are responsible for: As soon as you are able, call our office [phone], at any time of the day or night, and leave a message stating your name, the  date and nature of the emergency, and the name and dose of the medication   prescribed. In the event that your call is answered by a member of our staff, make sure to document and save the date, time, and the name of the person that took your information.  2. Exception #2 (Planned Surgery): In the event that you are scheduled by another doctor or dentist to have any type of surgery or procedure, you are allowed (for a period no longer than 30 days), to receive additional pain medication, for the acute post-op pain. However, in this case, you are responsible for picking up a copy of our "Post-op Pain Management for Surgeons" handout, and giving it to your surgeon or dentist. This document is available at our office, and does not require an appointment to obtain it. Simply go to our office during business hours (Monday-Thursday from 8:00 AM to 4:00 PM) (Friday 8:00 AM to 12:00 Noon) or if you have a scheduled appointment with us, prior to your surgery, and ask for it by name. In addition, you will need to provide us with your name, name of your surgeon, type of surgery, and date of procedure or surgery.  *Opioid medications include: morphine, codeine, oxycodone, oxymorphone, hydrocodone, hydromorphone, meperidine, tramadol, tapentadol, buprenorphine, fentanyl, methadone. **Benzodiazepine medications include: diazepam (Valium), alprazolam (Xanax), clonazepam (Klonopine), lorazepam (Ativan), clorazepate (Tranxene), chlordiazepoxide (Librium), estazolam (Prosom), oxazepam (Serax), temazepam (Restoril), triazolam (Halcion) (Last updated: 10/28/2017) ____________________________________________________________________________________________    

## 2018-06-15 NOTE — Progress Notes (Signed)
Patient's Name: Kendra Nelson  MRN: [id number]  Referring Provider: No ref. provider found  DOB: 1960-10-21  PCP: System, Pcp Not In  DOS: 06/15/2018  Note by: Dionisio David, NP  Service setting: Ambulatory outpatient  Specialty: Interventional Pain Management  Patient type: Established  Location: [facility] (AMB) Pain Management Facility  Visit type: Interventional Procedure   Primary Reason for Visit: Interventional Pain Management Treatment. CC: Back Pain (lumbar bilateral )  Procedure:  Intrathecal Drug Delivery System (IDDS):  Type: Reservoir Refill [phone])       Region: Abdominal Laterality: Left  Type of Pump: Medtronic Synchromed II (MRI-compatible) Delivery Route: Intrathecal Type of Pain Treated: Neuropathic/Nociceptive Primary Medication Class: Opioid/opiate  Medication, Concentration, Infusion Program, & Delivery Rate: Please see scanned programming printout.  Indications: 1. Chronic lumbosacral radiculopathy (L5) (Left)   2. 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 DDD (degenerative disc disease), lumbar   4. Presence of intrathecal pump   5. Adjustment and management of infusion pump   6. Chronic pain syndrome   7. Long term prescription opiate use    Pain Assessment: Self-Reported Pain Score: 5 /10             Reported level is compatible with observation.        Intrathecal Pump Therapy Assessment  Manufacturer: Medtronic Synchromed II Type: Programmable Volume: 40 mL reservoir MRI compatibility: Yes   Drug content:  Primary Medication Class: Opioid Primary Medication: PF-Fentanyl Secondary Medication: see pump readout Other Medication: see pump readout   Programming:  Type: Simple continuous. See pump readout for details.   Changes:  Medication Change: None at this point Rate Change: No change in rate  Reported side-effects or adverse reactions: None reported  Effectiveness: Described as relatively effective, allowing for  increase in activities of daily living (ADL) Clinically meaningful improvement in function (CMIF): Sustained CMIF goals met  Plan: Pump refill today  Pre-op Assessment:  Kendra Nelson is a 57 y.o. (year old), female patient, seen today for interventional treatment. 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 Kendra Nelson has a current medication list which includes the following prescription(s): acitretin, atorvastatin, cyclobenzaprine, gabapentin, lisinopril, meloxicam, metoprolol tartrate, multivitamin with minerals, oxycodone hcl, probiotic product, ropinirole, oxycodone hcl, and oxycodone hcl. Her primarily concern today is the Back Pain (lumbar bilateral )  Initial Vital Signs:  Pulse/HCG Rate: 61  Temp: 98.2 F (36.8 C) Resp: 16 BP: (!) 164/80 SpO2: 98 %  BMI: Estimated body mass index is 26.57 kg/m as calculated from the following:   Height as of this encounter: '5\' 3"'  (1.6 m).   Weight as of this encounter: 150 lb (68 kg).  Risk Assessment: Allergies: Reviewed. She is allergic to benadryl [diphenhydramine hcl] and bee venom.  Allergy Precautions: None required Coagulopathies: Reviewed. None identified.  Blood-thinner therapy: None at this time Active Infection(s): Reviewed. None identified. [name] is afebrile  Site Confirmation: Kendra Nelson was asked to confirm the procedure and laterality before marking the site Procedure checklist: Completed Consent: Before the procedure and under the influence of no sedative(s), amnesic(s), or anxiolytics, the patient was informed of the treatment options, risks and possible complications. To fulfill our ethical and legal obligations, as recommended by the American Medical  Association's Code of Ethics, I have informed the patient of  my clinical impression; the nature and purpose of the treatment or procedure; the risks, benefits, and possible complications of the intervention; the alternatives, including doing nothing; the risk(s) and benefit(s) of the alternative treatment(s) or procedure(s); and the risk(s) and benefit(s) of doing nothing.  Kendra Nelson was provided with information about the general risks and possible complications associated with most interventional procedures. These include, but are not limited to: failure to achieve desired goals, infection, bleeding, organ or nerve damage, allergic reactions, paralysis, and/or death.  In addition, she was informed of those risks and possible complications associated to this particular procedure, which include, but are not limited to: damage to the implant; failure to decrease pain; local, systemic, or serious CNS infections, intraspinal abscess with possible cord compression and paralysis, or life-threatening such as meningitis; bleeding; organ damage; nerve injury or damage with subsequent sensory, motor, and/or autonomic system dysfunction, resulting in transient or permanent pain, numbness, and/or weakness of one or several areas of the body; allergic reactions, either minor or major life-threatening, such as anaphylactic or anaphylactoid reactions.  Furthermore, Kendra Nelson was informed of those risks and complications associated with the medications. These include, but are not limited to: allergic reactions (i.e.: anaphylactic or anaphylactoid reactions); endorphine suppression; bradycardia and/or hypotension; water retention and/or peripheral vascular relaxation leading to lower extremity edema and possible stasis ulcers; respiratory depression and/or shortness of breath; decreased metabolic rate leading to weight gain; swelling or edema; medication-induced neural toxicity; particulate matter embolism and blood  vessel occlusion with resultant organ, and/or nervous system infarction; and/or intrathecal granuloma formation with possible spinal cord compression and permanent paralysis.  Before refilling the pump Kendra Nelson was informed that some of the medications used in the devise may not be FDA approved for such use and therefore it constitutes an off-label use of the medications.  Finally, she was informed that Medicine is not an exact science; therefore, there is also the possibility of unforeseen or unpredictable risks and/or possible complications that may result in a catastrophic outcome. The patient indicated having understood very clearly. We have given the patient no guarantees and we have made no promises. Enough time was given to the patient to ask questions, all of which were answered to the patient's satisfaction. Ms. Robley has indicated that she wanted to continue with the procedure. Attestation: I, the ordering provider, attest that I have discussed with the patient the benefits, risks, side-effects, alternatives, likelihood of achieving goals, and potential problems during recovery for the procedure that I have provided informed consent. Date  Time: 06/15/2018 10:46 AM  Pre-Procedure Preparation:  Monitoring: As per clinic protocol. Respiration, ETCO2, SpO2, BP, heart rate and rhythm monitor placed and checked for adequate function Safety Precautions: Patient was assessed for positional comfort and pressure points before starting the procedure. Time-out: I initiated and conducted the "Time-out" before starting the procedure, as per protocol. The patient was asked to participate by confirming the accuracy of the "Time Out" information. Verification of the correct person, site, and procedure were performed and confirmed by me, the nursing staff, and the patient. "Time-out" conducted as per Joint Commission's Universal Protocol (UP.01.01.01). Time: 1122  Description of Procedure Process:    Position: Supine Target Area: Central-port of intrathecal pump. Approach: Anterior, 90 degree angle approach. Area Prepped: Entire Area around the pump implant. Prepping solution: ChloraPrep (2% chlorhexidine gluconate and 70% isopropyl alcohol) Safety Precautions: Aspiration looking for blood return was conducted prior to all injections. At no point did we inject any substances, as  a needle was being advanced. No attempts were made at seeking any paresthesias. Safe injection practices and needle disposal techniques used. Medications properly checked for expiration dates. SDV (single dose vial) medications used. Description of the Procedure: Protocol guidelines were followed. Two nurses trained to do implant refills were present during the entire procedure. The refill medication was checked by both healthcare providers as well as the patient. The patient was included in the "Time-out" to verify the medication. The patient was placed in position. The pump was identified. The area was prepped in the usual manner. The sterile template was positioned over the pump, making sure the side-port location matched that of the pump. Both, the pump and the template were held for stability. The needle provided in the Medtronic Kit was then introduced thru the center of the template and into the central port. The pump content was aspirated and discarded volume documented. The new medication was slowly infused into the pump, thru the filter, making sure to avoid overpressure of the device. The needle was then removed and the area cleansed, making sure to leave some of the prepping solution back to take advantage of its long term bactericidal properties. The pump was interrogated and programmed to reflect the correct medication, volume, and dosage. The program was printed and taken to the physician for approval. Once checked and signed by the physician, a copy was provided to the patient and another scanned into the  EMR. Vitals:   06/15/18 1045  BP: (!) 164/80  Pulse: 61  Resp: 16  Temp: 98.2 F (36.8 C)  TempSrc: Oral  SpO2: 98%  Weight: 150 lb (68 kg)  Height: '5\' 3"'  (1.6 m)    Start Time: 1125 hrs. End Time: 1134 hrs. Materials & Medications: Medtronic Refill Kit Medication(s): Please see chart orders for details.  Imaging Guidance:          Type of Imaging Technique: None used Indication(s): N/A Exposure Time: No patient exposure Contrast: None used. Fluoroscopic Guidance: N/A Ultrasound Guidance: N/A Interpretation: N/A  Antibiotic Prophylaxis:   Anti-infectives (From admission, onward)   None     Indication(s): None identified  Post-operative Assessment:  Post-procedure Vital Signs:  Pulse/HCG Rate: 61  Temp: 98.2 F (36.8 C) Resp: 16 BP: (!) 164/80 SpO2: 98 %  EBL: None  Complications: No immediate post-treatment complications observed by team, or reported by patient.  Note: The patient tolerated the entire procedure well. A repeat set of vitals were taken after the procedure and the patient was kept under observation following institutional policy, for this type of procedure. Post-procedural neurological assessment was performed, showing return to baseline, prior to discharge. The patient was provided with post-procedure discharge instructions, including a section on how to identify potential problems. Should any problems arise concerning this procedure, the patient was given instructions to immediately contact us, at any time, without hesitation. In any case, we plan to contact the patient by telephone for a follow-up status report regarding this interventional procedure.  Comments:  No additional relevant information.  Controlled Substance Pharmacotherapy Assessment REMS (Risk Evaluation and Mitigation Strategy)  Analgesic: Oxycodone IR 10 mg 5x/day (8m/dayof oxycodone) (75MME/day) (04/18/2017) + intrathecal pump medication (fentanyl) (825 MME/day)  (915MME/day) Highest recorded MME/day: (915MME/day) MME/day: (900MME/day)  PJanett Billow RN  06/15/2018 11:55 AM  Sign at close encounter Nursing Pain Medication Assessment:  Safety precautions to be maintained throughout the outpatient stay will include: orient to surroundings, keep bed in low position, maintain call bell within reach at  all times, provide assistance with transfer out of bed and ambulation.  Medication Inspection Compliance: Pill count conducted under aseptic conditions, in front of the patient. Neither the pills nor the bottle was removed from the patient's sight at any time. Once count was completed pills were immediately returned to the patient in their original bottle.  Medication: Oxycodone IR Pill/Patch Count: 64 of 120 pills remain Pill/Patch Appearance: Markings consistent with prescribed medication Bottle Appearance: Standard pharmacy container. Clearly labeled. Filled Date: 09 / 30 / 2019 Last Medication intake:  Today       Pharmacokinetics: Liberation and absorption (onset of action): WNL Distribution (time to peak effect): WNL Metabolism and excretion (duration of action): WNL         Pharmacodynamics: Desired effects: Analgesia: Ms. Livolsi reports >50% benefit. Functional ability: Patient reports that medication allows her to accomplish basic ADLs Clinically meaningful improvement in function (CMIF): Sustained CMIF goals met Perceived effectiveness: Described as relatively effective, allowing for increase in activities of daily living (ADL) Undesirable effects: Side-effects or Adverse reactions: None reported Monitoring: [place] PMP: Online review of the past 37-monthperiod conducted. Compliant with practice rules and regulations Last UDS on record: Summary  Date Value Ref Range Status  09/29/2017 FINAL  Final    Comment:    ==================================================================== TOXASSURE SELECT 13  (MW) ==================================================================== Test                             Result       Flag       Units Drug Present and Declared for Prescription Verification   Oxymorphone                    66           EXPECTED   ng/mg creat   Noroxycodone                   65           EXPECTED   ng/mg creat    Oxymorphone and noroxycodone are expected metabolites of    oxycodone. Sources of oxycodone are scheduled prescription    medications. Oxymorphone is also available as a scheduled    prescription medication. Drug Present not Declared for Prescription Verification   Fentanyl                       24           UNEXPECTED ng/mg creat   Norfentanyl                    73           UNEXPECTED ng/mg creat    Source of fentanyl is a scheduled prescription medication,    including IV, patch, and transmucosal formulations. Norfentanyl    is an expected metabolite of fentanyl. Drug Absent but Declared for Prescription Verification   Oxycodone                      Not Detected UNEXPECTED ng/mg creat    Oxycodone is almost always present in patients taking this drug    consistently.  Absence of oxycodone could be due to lapse of time    since the last dose or unusual pharmacokinetics (rapid    metabolism). ==================================================================== Test  Result    Flag   Units      Ref Range   Creatinine              119              mg/dL      >=20 ==================================================================== Declared Medications:  The flagging and interpretation on this report are based on the  following declared medications.  Unexpected results may arise from  inaccuracies in the declared medications.  **Note: The testing scope of this panel includes these medications:  Oxycodone  **Note: The testing scope of this panel does not include following  reported medications:  Acitretin  Aspirin  Atorvastatin   Cyclobenzaprine  Lisinopril  Meloxicam  Multivitamin  Propranolol ==================================================================== For clinical consultation, please call [phone]. ====================================================================    UDS interpretation: Compliant          Medication Assessment Form: Reviewed. Patient indicates being compliant with therapy Treatment compliance: Compliant Risk Assessment Profile: Aberrant behavior: See prior evaluations. None observed or detected today Comorbid factors increasing risk of overdose: See prior notes. No additional risks detected today Opioid risk tool (ORT) (Total Score): 2 Personal History of Substance Abuse (SUD-Substance use disorder):  Alcohol: Negative  Illegal Drugs: Negative  Rx Drugs: Negative  ORT Risk Level calculation: Low Risk Risk of substance use disorder (SUD): Low Opioid Risk Tool - 06/15/18 1051      Family History of Substance Abuse   Alcohol  Negative    Illegal Drugs  (S) Positive Female   daughter, passed away    Rx Drugs  Negative      Personal History of Substance Abuse   Alcohol  Negative    Illegal Drugs  Negative    Rx Drugs  Negative      Psychological Disease   Psychological Disease  Negative    ADD  Negative    OCD  Negative    Bipolar  Negative    Schizophrenia  Negative    Depression  Negative      Total Score   Opioid Risk Tool Scoring  2    Opioid Risk Interpretation  Low Risk      ORT Scoring interpretation table:  Score <3 = Low Risk for SUD  Score between 4-7 = Moderate Risk for SUD  Score >8 = High Risk for Opioid Abuse   Risk Mitigation Strategies:  Patient Counseling: Covered Patient-Prescriber Agreement (PPA): Present and active  Notification to other healthcare providers: Done  Pharmacologic Plan: No change in therapy, at this time.             Assessment  Primary Diagnosis & Pertinent Problem List: The primary encounter diagnosis was  Chronic lumbosacral radiculopathy (L5) (Left). 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  Status Diagnosis  Controlled Controlled Controlled 1. Chronic lumbosacral radiculopathy (L5) (Left)   2. 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 DDD (degenerative disc disease), lumbar   4. Presence of intrathecal pump   5. Adjustment and management of infusion pump   6. Chronic pain syndrome   7. Long term prescription opiate use     Problems updated and reviewed during this visit: No problems updated. Plan of Care   Imaging Orders  No imaging studies ordered today  Procedure Orders    No procedure(s) ordered today    Medications ordered for procedure: Meds ordered this encounter  Medications  . Oxycodone HCl 10 MG TABS    Sig: Take 1 tablet (10 mg total) by mouth every 6 (six) hours as needed. Max: 4/day    Dispense:  120 tablet    Refill:  0    Do not add this medication to the electronic "Automatic Refill" notification system. Patient may have prescription filled one day early if pharmacy is closed on scheduled refill date.    Order Specific Question:   Supervising Provider    Answer:   Milinda Pointer [phone]  . Oxycodone HCl 10 MG TABS    Sig: Take 1 tablet (10 mg total) by mouth every 6 (six) hours as needed. Max: 4/day    Dispense:  120 tablet    Refill:  0    Do not add this medication to the electronic "Automatic Refill" notification system. Patient may have prescription filled one day early if pharmacy is closed on scheduled refill date.    Order Specific Question:   Supervising Provider    Answer:   Milinda Pointer [phone]  . Oxycodone HCl 10 MG TABS    Sig: Take 1 tablet (10 mg total) by  mouth every 6 (six) hours as needed. Max: 4/day    Dispense:  120 tablet    Refill:  0    Do not add this medication to the electronic "Automatic Refill" notification system. Patient may have prescription filled one day early if pharmacy is closed on scheduled refill date.    Order Specific Question:   Supervising Provider    Answer:   Milinda Pointer [[id number]]   Medications administered: Gerri Lins. Banik had no medications administered during this visit.  See the medical record for exact dosing, route, and time of administration.  Disposition: Discharge home  Discharge Date & Time: 06/15/2018; 1152 hrs.   Physician-requested Follow-up: Return in about 3 months (around 09/15/2018) for Pump refill based on programming.  Future Appointments  Date Time Provider [place]  09/14/2018 10:30 AM Vevelyn Francois, NP [facility] None   Primary Care Physician: System, Pcp Not In Location: [facility] Facility Note by: Dionisio David, NP Date: 06/15/2018; Time: 4:17 PM  Disclaimer:  Medicine is not an exact science. The only guarantee in medicine is that nothing is guaranteed. It is important to note that the decision to proceed with this intervention was based on the information collected from the patient. The Data and conclusions were drawn from the patient's questionnaire, the interview, and the physical examination. Because the information was provided in large part by the patient, it cannot be guaranteed that it has not been purposely or unconsciously manipulated. Every effort has been made to obtain as much relevant data as possible for this evaluation. It is important to note that the conclusions that lead to this procedure are derived in large part from the available data. Always take into account that the treatment will also be dependent on availability of resources and existing treatment guidelines, considered by other Pain Management Practitioners as being common  knowledge and practice, at the time of the intervention. For Medico-Legal purposes, it is also important to point out that variation in procedural techniques and pharmacological choices are the acceptable norm. The indications, contraindications, technique, and results of the above procedure should only be interpreted and judged by a Board-Certified Interventional Pain Specialist with extensive familiarity and expertise in  the same exact procedure and technique.

## 2018-06-21 LAB — TOXASSURE SELECT 13 (MW), URINE

## 2018-06-23 MED FILL — Medication: INTRATHECAL | Qty: 1 | Status: AC

## 2018-08-25 ENCOUNTER — Other Ambulatory Visit: Payer: Self-pay

## 2018-08-25 MED ORDER — PAIN MANAGEMENT IT PUMP REFILL: 1.0000 | Freq: Once | INTRATHECAL | 0 refills | Status: AC

## 2018-09-14 ENCOUNTER — Ambulatory Visit: Payer: Medicare Other | Attending: Nurse Practitioner | Admitting: Nurse Practitioner

## 2018-09-14 ENCOUNTER — Encounter: Payer: Self-pay | Admitting: Nurse Practitioner

## 2018-09-14 ENCOUNTER — Other Ambulatory Visit: Payer: Self-pay

## 2018-09-14 VITALS — BP 158/72 | HR 48 | Temp 97.7°F | Resp 16 | Ht 63.0 in | Wt 176.7 lb

## 2018-09-14 DIAGNOSIS — G894 Chronic pain syndrome: Secondary | ICD-10-CM

## 2018-09-14 DIAGNOSIS — M5417 Radiculopathy, lumbosacral region: Secondary | ICD-10-CM | POA: Diagnosis present

## 2018-09-14 DIAGNOSIS — M5126 Other intervertebral disc displacement, lumbar region: Secondary | ICD-10-CM | POA: Diagnosis present

## 2018-09-14 DIAGNOSIS — Z451 Encounter for adjustment and management of infusion pump: Secondary | ICD-10-CM | POA: Insufficient documentation

## 2018-09-14 DIAGNOSIS — M5136 Other intervertebral disc degeneration, lumbar region: Secondary | ICD-10-CM | POA: Diagnosis present

## 2018-09-14 MED ORDER — OXYCODONE HCL 10 MG PO TABS: 10.0000 mg | ORAL_TABLET | Freq: Four times a day (QID) | ORAL | 0 refills | Status: DC | PRN

## 2018-09-14 MED FILL — Medication: INTRATHECAL | Qty: 1 | Status: CN

## 2018-09-14 NOTE — Progress Notes (Signed)
Nursing Pain Medication Assessment:  Safety precautions to be maintained throughout the outpatient stay will include: orient to surroundings, keep bed in low position, maintain call bell within reach at all times, provide assistance with transfer out of bed and ambulation.  Medication Inspection Compliance: Pill count conducted under aseptic conditions, in front of the patient. Neither the pills nor the bottle was removed from the patient's sight at any time. Once count was completed pills were immediately returned to the patient in their original bottle.  Medication: Oxycodone IR Pill/Patch Count: 51 of 120 pills remain Pill/Patch Appearance: Markings consistent with prescribed medication Bottle Appearance: Standard pharmacy container. Clearly labeled. Filled Date: 12 / 28 / 2019 Last Medication intake:  Today

## 2018-09-14 NOTE — Progress Notes (Signed)
Patient's Name: Kendra Nelson  MRN: [id number]  Referring Provider: No ref. provider found  DOB: 23-Dec-1960  PCP: System, Pcp Not In  DOS: 09/14/2018  Note by: Dionisio David, NP  Service setting: Ambulatory outpatient  Specialty: Interventional Pain Management  Patient type: Established  Location: [facility] (AMB) Pain Management Facility  Visit type: Interventional Procedure   Primary Reason for Visit: Interventional Pain Management Treatment. CC: Back Pain (lower)  Procedure:  Intrathecal Drug Delivery System (IDDS):  Type: Reservoir Refill [phone])       Region: Abdominal Laterality: Left  Type of Pump: Medtronic Synchromed II (MRI-compatible) Delivery Route: Intrathecal Type of Pain Treated: Neuropathic/Nociceptive Primary Medication Class: Opioid/opiate  Medication, Concentration, Infusion Program, & Delivery Rate: Please see scanned programming printout.  Indications: 1. Chronic lumbosacral radiculopathy (L5) (Left)   2. DDD (degenerative disc disease), lumbar   3. 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 Adjustment and management of infusion pump   5. Chronic pain syndrome    Pain Assessment: Self-Reported Pain Score: 3 /10             Reported level is compatible with observation.        Intrathecal Pump Therapy Assessment  Manufacturer: Medtronic Synchromed II Type: Programmable Volume: 40 mL reservoir MRI compatibility: Yes   Drug content:  Primary Medication Class: Opioid Primary Medication: PF-Fentanyl Secondary Medication: PF-Bupivacaine Other Medication: see pump readout   Programming:  Type: Simple continuous. See pump readout for details.   Changes:  Medication Change: None at this point Rate Change: No change in rate  Reported side-effects or adverse reactions: None reported  Effectiveness: Described as relatively effective, allowing for increase in activities of daily living (ADL) Clinically meaningful improvement in function (CMIF): Sustained  CMIF goals met  Plan: Pump refill today  Pre-op Assessment:  Kendra Nelson is a 58 y.o. (year old), female patient, seen today for interventional treatment. 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 Kendra Nelson has a current medication list which includes the following prescription(s): acitretin, atorvastatin, cyclobenzaprine, lisinopril, oxycodone hcl, probiotic product, ropinirole, gabapentin, meloxicam, metoprolol tartrate, multivitamin with minerals, oxycodone hcl, and oxycodone hcl. Her primarily concern today is the Back Pain (lower)  Initial Vital Signs:  Pulse/HCG Rate: (!) 41  Temp: 97.8 F (36.6 C) Resp: 16 BP: (!) 127/115 SpO2: 100 %  BMI: Estimated body mass index is 31.3 kg/m as calculated from the following:   Height as of this encounter: '5\' 3"'  (1.6 m).   Weight as of this encounter: 176 lb 11.2 oz (80.2 kg).  Risk Assessment: Allergies: Reviewed. She is allergic to benadryl [diphenhydramine hcl] and bee venom.  Allergy Precautions: None required Coagulopathies: Reviewed. None identified.  Blood-thinner therapy: None at this time Active Infection(s): Reviewed. None identified. Kendra Nelson is afebrile  Site Confirmation: Kendra Nelson was asked to confirm the procedure and laterality before marking the site Procedure checklist: Completed Consent: Before the procedure and under the influence of no sedative(s), amnesic(s), or anxiolytics, the patient was informed of the treatment options, risks and possible complications. To fulfill our ethical and legal obligations, as recommended by the American Medical Association's Code of Ethics, I have informed the patient of my clinical impression; the nature and purpose of the  treatment or procedure; the risks, benefits, and possible complications of the intervention; the  alternatives, including doing nothing; the risk(s) and benefit(s) of the alternative treatment(s) or procedure(s); and the risk(s) and benefit(s) of doing nothing.  Kendra Nelson was provided with information about the general risks and possible complications associated with most interventional procedures. These include, but are not limited to: failure to achieve desired goals, infection, bleeding, organ or nerve damage, allergic reactions, paralysis, and/or death.  In addition, she was informed of those risks and possible complications associated to this particular procedure, which include, but are not limited to: damage to the implant; failure to decrease pain; local, systemic, or serious CNS infections, intraspinal abscess with possible cord compression and paralysis, or life-threatening such as meningitis; bleeding; organ damage; nerve injury or damage with subsequent sensory, motor, and/or autonomic system dysfunction, resulting in transient or permanent pain, numbness, and/or weakness of one or several areas of the body; allergic reactions, either minor or major life-threatening, such as anaphylactic or anaphylactoid reactions.  Furthermore, Kendra Nelson was informed of those risks and complications associated with the medications. These include, but are not limited to: allergic reactions (i.e.: anaphylactic or anaphylactoid reactions); endorphine suppression; bradycardia and/or hypotension; water retention and/or peripheral vascular relaxation leading to lower extremity edema and possible stasis ulcers; respiratory depression and/or shortness of breath; decreased metabolic rate leading to weight gain; swelling or edema; medication-induced neural toxicity; particulate matter embolism and blood vessel occlusion with resultant organ, and/or nervous system infarction; and/or intrathecal granuloma formation with  possible spinal cord compression and permanent paralysis.  Before refilling the pump Kendra Nelson was informed that some of the medications used in the devise may not be FDA approved for such use and therefore it constitutes an off-label use of the medications.  Finally, she was informed that Medicine is not an exact science; therefore, there is also the possibility of unforeseen or unpredictable risks and/or possible complications that may result in a catastrophic outcome. The patient indicated having understood very clearly. We have given the patient no guarantees and we have made no promises. Enough time was given to the patient to ask questions, all of which were answered to the patient's satisfaction. Ms. Salemi has indicated that she wanted to continue with the procedure. Attestation: I, the ordering provider, attest that I have discussed with the patient the benefits, risks, side-effects, alternatives, likelihood of achieving goals, and potential problems during recovery for the procedure that I have provided informed consent. Date  Time: 09/14/2018 11:08 AM  Pre-Procedure Preparation:  Monitoring: As per clinic protocol. Respiration, ETCO2, SpO2, BP, heart rate and rhythm monitor placed and checked for adequate function Safety Precautions: Patient was assessed for positional comfort and pressure points before starting the procedure. Time-out: I initiated and conducted the "Time-out" before starting the procedure, as per protocol. The patient was asked to participate by confirming the accuracy of the "Time Out" information. Verification of the correct person, site, and procedure were performed and confirmed by me, the nursing staff, and the patient. "Time-out" conducted as per Joint Commission's Universal Protocol (UP.01.01.01). Time: 1125  Description of Procedure Process:   Position: Supine Target Area: Central-port of intrathecal pump. Approach: Anterior, 90 degree angle  approach. Area Prepped: Entire Area around the pump implant. Prepping solution: ChloraPrep (2% chlorhexidine gluconate and 70% isopropyl alcohol) Safety Precautions: Aspiration looking for blood return was conducted prior to all injections. At no point did we inject any substances, as a needle was being advanced. No attempts were made at seeking any paresthesias. Safe injection practices and needle disposal techniques used. Medications  properly checked for expiration dates. SDV (single dose vial) medications used. Description of the Procedure: Protocol guidelines were followed. Two nurses trained to do implant refills were present during the entire procedure. The refill medication was checked by both healthcare providers as well as the patient. The patient was included in the "Time-out" to verify the medication. The patient was placed in position. The pump was identified. The area was prepped in the usual manner. The sterile template was positioned over the pump, making sure the side-port location matched that of the pump. Both, the pump and the template were held for stability. The needle provided in the Medtronic Kit was then introduced thru the center of the template and into the central port. The pump content was aspirated and discarded volume documented. The new medication was slowly infused into the pump, thru the filter, making sure to avoid overpressure of the device. The needle was then removed and the area cleansed, making sure to leave some of the prepping solution back to take advantage of its long term bactericidal properties. The pump was interrogated and programmed to reflect the correct medication, volume, and dosage. The program was printed and taken to the physician for approval. Once checked and signed by the physician, a copy was provided to the patient and another scanned into the EMR. Vitals:   09/14/18 1107 09/14/18 1109  BP: (!) 127/115 (!) 158/72  Pulse: (!) 41 (!) 48  Resp: 16 16   Temp: 97.8 F (36.6 C) 97.7 F (36.5 C)  TempSrc: Oral Oral  SpO2: 100% 100%  Weight: 176 lb 11.2 oz (80.2 kg)   Height: '5\' 3"'  (1.6 m)     Start Time: 1127 hrs. End Time: 1136 hrs. Materials & Medications: Medtronic Refill Kit Medication(s): Please see chart orders for details.  Imaging Guidance:          Type of Imaging Technique: None used Indication(s): N/A Exposure Time: No patient exposure Contrast: None used. Fluoroscopic Guidance: N/A Ultrasound Guidance: N/A Interpretation: N/A  Antibiotic Prophylaxis:   Anti-infectives (From admission, onward)   None     Indication(s): None identified  Post-operative Assessment:  Post-procedure Vital Signs:  Pulse/HCG Rate: (!) 48  Temp: 97.7 F (36.5 C) Resp: 16 BP: (!) 158/72 SpO2: 100 %  EBL: None  Complications: No immediate post-treatment complications observed by team, or reported by patient.  Note: The patient tolerated the entire procedure well. A repeat set of vitals were taken after the procedure and the patient was kept under observation following institutional policy, for this type of procedure. Post-procedural neurological assessment was performed, showing return to baseline, prior to discharge. The patient was provided with post-procedure discharge instructions, including a section on how to identify potential problems. Should any problems arise concerning this procedure, the patient was given instructions to immediately contact us, at any time, without hesitation. In any case, we plan to contact the patient by telephone for a follow-up status report regarding this interventional procedure.  Comments:  No additional relevant information.  Controlled Substance Pharmacotherapy Assessment REMS (Risk Evaluation and Mitigation Strategy)  Analgesic:Oxycodone IR 68m 5x/day(528mdayof oxycodone) (75MME/day) (04/18/2017) + intrathecal pump medication (fentanyl) (825 MME/day) (915MME/day) MME/day:  (900MME/day) WeRise PatienceRN  09/14/2018 11:19 AM  Signed Nursing Pain Medication Assessment:  Safety precautions to be maintained throughout the outpatient stay will include: orient to surroundings, keep bed in low position, maintain call bell within reach at all times, provide assistance with transfer out of bed and ambulation.  Medication Inspection Compliance:  Pill count conducted under aseptic conditions, in front of the patient. Neither the pills nor the bottle was removed from the patient's sight at any time. Once count was completed pills were immediately returned to the patient in their original bottle.  Medication: Oxycodone IR Pill/Patch Count: 51 of 120 pills remain Pill/Patch Appearance: Markings consistent with prescribed medication Bottle Appearance: Standard pharmacy container. Clearly labeled. Filled Date: 54 / 28 / 2019 Last Medication intake:  Today   Pharmacokinetics: Liberation and absorption (onset of action): WNL Distribution (time to peak effect): WNL Metabolism and excretion (duration of action): WNL         Pharmacodynamics: Desired effects: Analgesia: Ms. Lastinger reports >50% benefit. Functional ability: Patient reports that medication allows her to accomplish basic ADLs Clinically meaningful improvement in function (CMIF): Sustained CMIF goals met Perceived effectiveness: Described as relatively effective, allowing for increase in activities of daily living (ADL) Undesirable effects: Side-effects or Adverse reactions: None reported Monitoring: [place] PMP: Online review of the past 110-monthperiod conducted. Compliant with practice rules and regulations Last UDS on record: Summary  Date Value Ref Range Status  06/15/2018 FINAL  Final    Comment:    ==================================================================== TOXASSURE SELECT 13 (MW) ==================================================================== Test                             Result        Flag       Units Drug Present and Declared for Prescription Verification   Oxycodone                      1026         EXPECTED   ng/mg creat   Oxymorphone                    1474         EXPECTED   ng/mg creat   Noroxycodone                   3426         EXPECTED   ng/mg creat   Noroxymorphone                 351          EXPECTED   ng/mg creat    Sources of oxycodone are scheduled prescription medications.    Oxymorphone, noroxycodone, and noroxymorphone are expected    metabolites of oxycodone. Oxymorphone is also available as a    scheduled prescription medication. Drug Present not Declared for Prescription Verification   Fentanyl                       4            UNEXPECTED ng/mg creat   Norfentanyl                    62           UNEXPECTED ng/mg creat    Source of fentanyl is a scheduled prescription medication,    including IV, patch, and transmucosal formulations. Norfentanyl    is an expected metabolite of fentanyl. ==================================================================== Test                      Result    Flag   Units      Ref Range   Creatinine  81               mg/dL      >=20 ==================================================================== Declared Medications:  The flagging and interpretation on this report are based on the  following declared medications.  Unexpected results may arise from  inaccuracies in the declared medications.  **Note: The testing scope of this panel includes these medications:  Oxycodone  **Note: The testing scope of this panel does not include following  reported medications:  Acitretin (Soriatane)  Atorvastatin (Lipitor)  Cyclobenzaprine (Flexeril)  Gabapentin  Lisinopril  Meloxicam (Mobic)  Metoprolol  Multivitamin  Ropinirole (Requip) ==================================================================== For clinical consultation, please call [phone]. ====================================================================    UDS interpretation: Compliant          Fentanyl in ITP Medication Assessment Form: Reviewed. Patient indicates being compliant with therapy Treatment compliance: Compliant Risk Assessment Profile: Aberrant behavior: See prior evaluations. None observed or detected today Comorbid factors increasing risk of overdose: See prior notes. No additional risks detected today Opioid risk tool (ORT) (Total Score): 2 Personal History of Substance Abuse (SUD-Substance use disorder):  Alcohol: Negative  Illegal Drugs: Negative  Rx Drugs: Negative  ORT Risk Level calculation: Low Risk Risk of substance use disorder (SUD): Moderate Opioid Risk Tool - 09/14/18 1115      Family History of Substance Abuse   Alcohol  Negative    Illegal Drugs  Positive Female    Rx Drugs  Negative      Personal History of Substance Abuse   Alcohol  Negative    Illegal Drugs  Negative    Rx Drugs  Negative      Age   Age between 29-45 years   No      History of Preadolescent Sexual Abuse   History of Preadolescent Sexual Abuse  Negative or Female      Psychological Disease   Psychological Disease  Negative    Depression  Negative      Total Score   Opioid Risk Tool Scoring  2    Opioid Risk Interpretation  Low Risk      ORT Scoring interpretation table:  Score <3 = Low Risk for SUD  Score between 4-7 = Moderate Risk for SUD  Score >8 = High Risk for Opioid Abuse   Risk Mitigation Strategies:  Patient Counseling: Covered Patient-Prescriber Agreement (PPA): Present and active  Notification to other healthcare providers: Done  Pharmacologic Plan: No change in therapy, at this time.             Assessment  Primary Diagnosis & Pertinent Problem List: The primary encounter diagnosis was Chronic lumbosacral radiculopathy (L5) (Left). Diagnoses of DDD (degenerative disc disease), lumbar, Lumbar intervertebral disc protrusion  (L2-3), Adjustment and management of infusion pump, and Chronic pain syndrome were also pertinent to this visit.  Status Diagnosis  Controlled Controlled Controlled 1. Chronic lumbosacral radiculopathy (L5) (Left)   2. DDD (degenerative disc disease), lumbar   3. 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 Adjustment and management of infusion pump   5. Chronic pain syndrome     Problems updated and reviewed during this visit: No problems updated. Plan of Care   Imaging Orders  No imaging studies ordered today   Procedure Orders    No procedure(s) ordered today    Medications ordered for procedure: Meds ordered this encounter  Medications  . Oxycodone HCl 10 MG TABS    Sig: Take 1 tablet (10 mg total) by mouth every 6 (six) hours  as needed for up to 30 days.    Dispense:  120 tablet    Refill:  0    Do not add this medication to the electronic "Automatic Refill" notification system. Patient may have prescription filled one day early if pharmacy is closed on scheduled refill date.    Order Specific Question:   Supervising Provider    Answer:   Milinda Pointer [phone]  . Oxycodone HCl 10 MG TABS    Sig: Take 1 tablet (10 mg total) by mouth every 6 (six) hours as needed for up to 30 days. Max: 4/day    Dispense:  120 tablet    Refill:  0    Do not add this medication to the electronic "Automatic Refill" notification system. Patient may have prescription filled one day early if pharmacy is closed on scheduled refill date.    Order Specific Question:   Supervising Provider    Answer:   Milinda Pointer [phone]  . Oxycodone HCl 10 MG TABS    Sig: Take 1 tablet (10 mg total) by mouth every 6 (six) hours as needed for up to 30 days. Max: 4/day    Dispense:  120 tablet    Refill:  0    Do not add this medication to the electronic "Automatic Refill" notification system. Patient may have prescription filled one day early if pharmacy is closed on scheduled refill date.     Order Specific Question:   Supervising Provider    Answer:   Milinda Pointer [[id number]]   Medications administered: Gerri Lins. Flammia had no medications administered during this visit.  See the medical record for exact dosing, route, and time of administration.  Disposition: Discharge home  Discharge Date & Time: 09/14/2018; 1155 hrs.   Physician-requested Follow-up: Return in about 3 months (around 12/14/2018) for Pump refill based on programming, w/ Dr. Dossie Arbour.  Future Appointments  Date Time Provider [place]  12/14/2018 11:45 AM Milinda Pointer, MD [facility] None   Primary Care Physician: System, Pcp Not In Location: [facility] Note by: Dionisio David, NP Date: 09/14/2018; Time: 4:16 PM  Disclaimer:  Medicine is not an exact science. The only guarantee in medicine is that nothing is guaranteed. It is important to note that the decision to proceed with this intervention was based on the information collected from the patient. The Data and conclusions were drawn from the patient's questionnaire, the interview, and the physical examination. Because the information was provided in large part by the patient, it cannot be guaranteed that it has not been purposely or unconsciously manipulated. Every effort has been made to obtain as much relevant data as possible for this evaluation. It is important to note that the conclusions that lead to this procedure are derived in large part from the available data. Always take into account that the treatment will also be dependent on availability of resources and existing treatment guidelines, considered by other Pain Management Practitioners as being common knowledge and practice, at the time of the intervention. For Medico-Legal purposes, it is also important to point out that variation in procedural techniques and pharmacological choices are the acceptable norm. The indications, contraindications, technique, and  results of the above procedure should only be interpreted and judged by a Board-Certified Interventional Pain Specialist with extensive familiarity and expertise in the same exact procedure and technique.

## 2018-09-14 NOTE — Patient Instructions (Addendum)
____________________________________________________________________________________________  Medication Rules  Purpose: To inform patients, and their family members, of our rules and regulations.  Applies to: All patients receiving prescriptions (written or electronic).  Pharmacy of record: Pharmacy where electronic prescriptions will be sent. If written prescriptions are taken to a different pharmacy, please inform the nursing staff. The pharmacy listed in the electronic medical record should be the one where you would like electronic prescriptions to be sent.  Electronic prescriptions: In compliance with the [place] Strengthen Opioid Misuse Prevention (STOP) Act of 2017 (Session Law 2017-74/H243), effective August 31, 2018, all controlled substances must be electronically prescribed. Calling prescriptions to the pharmacy will cease to exist.  Prescription refills: Only during scheduled appointments. Applies to all prescriptions.  NOTE: The following applies primarily to controlled substances (Opioid* Pain Medications).   Patient's responsibilities: 1. Pain Pills: Bring all pain pills to every appointment (except for procedure appointments). 2. Pill Bottles: Bring pills in original pharmacy bottle. Always bring the newest bottle. Bring bottle, even if empty. 3. Medication refills: You are responsible for knowing and keeping track of what medications you take and those you need refilled. The day before your appointment: write a list of all prescriptions that need to be refilled. The day of the appointment: give the list to the admitting nurse. Prescriptions will be written only during appointments. If you forget a medication: it will not be "Called in", "Faxed", or "electronically sent". You will need to get another appointment to get these prescribed. No early refills. Do not call asking to have your prescription filled early. 4. Prescription Accuracy: You are responsible for  carefully inspecting your prescriptions before leaving our office. Have the discharge nurse carefully go over each prescription with you, before taking them home. Make sure that your name is accurately spelled, that your address is correct. Check the name and dose of your medication to make sure it is accurate. Check the number of pills, and the written instructions to make sure they are clear and accurate. Make sure that you are given enough medication to last until your next medication refill appointment. 5. Taking Medication: Take medication as prescribed. When it comes to controlled substances, taking less pills or less frequently than prescribed is permitted and encouraged. Never take more pills than instructed. Never take medication more frequently than prescribed.  6. Inform other Doctors: Always inform, all of your healthcare providers, of all the medications you take. 7. Pain Medication from other Providers: You are not allowed to accept any additional pain medication from any other Doctor or Healthcare provider. There are two exceptions to this rule. (see below) In the event that you require additional pain medication, you are responsible for notifying us, as stated below. 8. Medication Agreement: You are responsible for carefully reading and following our Medication Agreement. This must be signed before receiving any prescriptions from our practice. Safely store a copy of your signed Agreement. Violations to the Agreement will result in no further prescriptions. (Additional copies of our Medication Agreement are available upon request.) 9. Laws, Rules, & Regulations: All patients are expected to follow all Federal and State Laws, Statutes, Rules, & Regulations. Ignorance of the Laws does not constitute a valid excuse. The use of any illegal substances is prohibited. 10. Adopted CDC guidelines & recommendations: Target dosing levels will be at or below 60 MME/day. Use of benzodiazepines** is not  recommended.  Exceptions: There are only two exceptions to the rule of not receiving pain medications from other Healthcare Providers. 1.   Exception #1 (Emergencies): In the event of an emergency (i.e.: accident requiring emergency care), you are allowed to receive additional pain medication. However, you are responsible for: As soon as you are able, call our office [phone], at any time of the day or night, and leave a message stating your name, the date and nature of the emergency, and the name and dose of the medication prescribed. In the event that your call is answered by a member of our staff, make sure to document and save the date, time, and the name of the person that took your information.  2. Exception #2 (Planned Surgery): In the event that you are scheduled by another doctor or dentist to have any type of surgery or procedure, you are allowed (for a period no longer than 30 days), to receive additional pain medication, for the acute post-op pain. However, in this case, you are responsible for picking up a copy of our "Post-op Pain Management for Surgeons" handout, and giving it to your surgeon or dentist. This document is available at our office, and does not require an appointment to obtain it. Simply go to our office during business hours (Monday-Thursday from 8:00 AM to 4:00 PM) (Friday 8:00 AM to 12:00 Noon) or if you have a scheduled appointment with [place], prior to your surgery, and ask for it by name. In addition, you will need to provide [place] with your name, name of your surgeon, type of surgery, and date of procedure or surgery.  *Opioid medications include: morphine, codeine, oxycodone, oxymorphone, hydrocodone, hydromorphone, meperidine, tramadol, tapentadol, buprenorphine, fentanyl, methadone. **Benzodiazepine medications include: diazepam (Valium), alprazolam (Xanax), clonazepam (Klonopine), lorazepam (Ativan), clorazepate (Tranxene), chlordiazepoxide (Librium), estazolam (Prosom),  oxazepam (Serax), temazepam (Restoril), triazolam (Halcion) (Last updated: 10/28/2017) ____________________________________________________________________________________________   BMI: Estimated body mass index is 31.3 kg/m as calculated from the following:   Height as of this encounter: 5\' 3"  (1.6 m).   Weight as of this encounter: 176 lb 11.2 oz (80.2 kg).  Oxycodone hcl 10 mg x 3 months escribed to pharmacy fill dates:  09/27/18, 10/27/18, 11/26/18

## 2018-09-15 MED FILL — Medication: INTRATHECAL | Qty: 1 | Status: AC

## 2018-11-17 ENCOUNTER — Other Ambulatory Visit: Payer: Self-pay

## 2018-11-17 MED ORDER — PAIN MANAGEMENT IT PUMP REFILL: 1.0000 | Freq: Once | INTRATHECAL | 0 refills | Status: AC

## 2018-12-13 ENCOUNTER — Encounter: Payer: Self-pay | Admitting: Pain Medicine

## 2018-12-13 ENCOUNTER — Other Ambulatory Visit: Payer: Self-pay

## 2018-12-13 ENCOUNTER — Ambulatory Visit: Payer: Medicare HMO | Attending: Pain Medicine | Admitting: Pain Medicine

## 2018-12-13 VITALS — BP 183/74 | HR 84 | Temp 98.0°F | Resp 16 | Ht 63.0 in | Wt 176.0 lb

## 2018-12-13 DIAGNOSIS — M5442 Lumbago with sciatica, left side: Secondary | ICD-10-CM | POA: Insufficient documentation

## 2018-12-13 DIAGNOSIS — Z462 Encounter for fitting and adjustment of other devices related to nervous system and special senses: Secondary | ICD-10-CM | POA: Diagnosis present

## 2018-12-13 DIAGNOSIS — Z451 Encounter for adjustment and management of infusion pump: Secondary | ICD-10-CM | POA: Insufficient documentation

## 2018-12-13 DIAGNOSIS — M79604 Pain in right leg: Secondary | ICD-10-CM | POA: Insufficient documentation

## 2018-12-13 DIAGNOSIS — M79605 Pain in left leg: Secondary | ICD-10-CM | POA: Insufficient documentation

## 2018-12-13 DIAGNOSIS — M961 Postlaminectomy syndrome, not elsewhere classified: Secondary | ICD-10-CM | POA: Diagnosis present

## 2018-12-13 DIAGNOSIS — G8929 Other chronic pain: Secondary | ICD-10-CM | POA: Insufficient documentation

## 2018-12-13 DIAGNOSIS — M5441 Lumbago with sciatica, right side: Secondary | ICD-10-CM | POA: Insufficient documentation

## 2018-12-13 DIAGNOSIS — G894 Chronic pain syndrome: Secondary | ICD-10-CM | POA: Insufficient documentation

## 2018-12-13 DIAGNOSIS — Z978 Presence of other specified devices: Secondary | ICD-10-CM | POA: Diagnosis present

## 2018-12-13 MED ORDER — OXYCODONE HCL 10 MG PO TABS: 10.0000 mg | ORAL_TABLET | Freq: Four times a day (QID) | ORAL | 0 refills | Status: DC | PRN

## 2018-12-13 NOTE — Progress Notes (Signed)
Safety precautions to be maintained throughout the outpatient stay will include: orient to surroundings, keep bed in low position, maintain call bell within reach at all times, provide assistance with transfer out of bed and ambulation.  

## 2018-12-13 NOTE — Progress Notes (Signed)
Patient's Name: Kendra Nelson  MRN: [id number]  Referring Provider: No ref. provider found  DOB: 11-23-1960  PCP: System, Pcp Not In  DOS: 12/13/2018  Note by: Gaspar Cola, MD  Service setting: Ambulatory outpatient  Specialty: Interventional Pain Management  Patient type: Established  Location: [facility] (AMB) Pain Management Facility  Visit type: Interventional Procedure   Primary Reason for Visit: Interventional Pain Management Treatment. CC: Back Pain (lumbar)  Procedure:          Intrathecal Drug Delivery System (IDDS):  Type: Reservoir Refill [id number]) No rate change Region: Abdominal Laterality: Left  Type of Pump: Medtronic Synchromed II Delivery Route: Intrathecal Type of Pain Treated: Neuropathic/Nociceptive Primary Medication Class: Opioid/opiate  Medication, Concentration, Infusion Program, & Delivery Rate: Please see scanned programming printout.   Indications: 1. Chronic low back pain (Primary Area of Pain) (Bilateral) (R>L)   2. Chronic pain syndrome   3. 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 Presence of intrathecal pump   6. Adjustment and management of infusion pump    Pain Assessment: Self-Reported Pain Score: 3 /10             Reported level is compatible with observation.        Intrathecal Pump Therapy Assessment  Manufacturer: Medtronic Synchromed Type: Programmable Volume: 40 mL reservoir MRI compatibility: Yes   Drug content:  Primary Medication Class: Opioid Primary Medication: PF-Fentanyl (1,000 mcg/mL)  Secondary Medication: PF-Bupivacaine (20 mg/mL)  Other Medication: see pump readout   Programming:  Type: Simple continuous. See pump readout for details.   Changes:  Medication Change: None at this point Rate Change: No change in rate  Reported side-effects or adverse reactions: None reported  Effectiveness: Described as  relatively effective, allowing for increase in activities of daily living (ADL) Clinically meaningful improvement in function (CMIF): Sustained CMIF goals met  Plan: Pump refill today  Pharmacotherapy Assessment  Analgesic: Oxycodone IR 87m 4x/day(442mdayof oxycodone) (60MME/day) + 357 mcg/day of intrathecal PF-Fentanyl (33 MME/day) MME/day: 60MME/day (oral) + 33 MME/day (intrathecal) = 93 MME/day (Aprox. 1.16 MME/day/kg)   Monitoring: Pharmacotherapy: No side-effects or adverse reactions reported. [place] PMP: PDMP reviewed during this encounter.       Compliance: No problems identified. Plan: Refer to "POC".  Pre-op Assessment:  Ms. MaNeuhauss a [age].o. (year old), female patient, seen today for interventional treatment. 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 Ms. MaKlinckas a current medication list which includes the following prescription(s): acitretin, atorvastatin, cyclobenzaprine, lisinopril, meloxicam, metoprolol tartrate, multivitamin with minerals, oxycodone hcl, oxycodone hcl, oxycodone hcl, and ropinirole. Her primarily concern today is the Back Pain (lumbar)  Initial Vital Signs:  Pulse/HCG Rate: 84  Temp: 98 F (36.7 C) Resp: 16 BP: (!) 183/74 SpO2: 98 %  BMI: Estimated body mass index is 31.18 kg/m as calculated from the following:   Height as of this encounter: '5\' 3"'  (1.6 m).   Weight as of this encounter: 176 lb (79.8 kg).  Risk Assessment: Allergies: Reviewed. She is allergic to benadryl [diphenhydramine hcl] and bee venom.  Allergy Precautions: None required Coagulopathies: Reviewed. None identified.  Blood-thinner therapy: None at this time Active Infection(s): Reviewed. None identified. Ms.  MaBohlkens afebrile  Site  Confirmation: Ms. Gomer was asked to confirm the procedure and laterality before marking the site Procedure checklist: Completed Consent: Before the procedure and under the influence of no sedative(s), amnesic(s), or anxiolytics, the patient was informed of the treatment options, risks and possible complications. To fulfill our ethical and legal obligations, as recommended by the American Medical Association's Code of Ethics, I have informed the patient of my clinical impression; the nature and purpose of the treatment or procedure; the risks, benefits, and possible complications of the intervention; the alternatives, including doing nothing; the risk(s) and benefit(s) of the alternative treatment(s) or procedure(s); and the risk(s) and benefit(s) of doing nothing.  Ms. Zalar was provided with information about the general risks and possible complications associated with most interventional procedures. These include, but are not limited to: failure to achieve desired goals, infection, bleeding, organ or nerve damage, allergic reactions, paralysis, and/or death.  In addition, she was informed of those risks and possible complications associated to this particular procedure, which include, but are not limited to: damage to the implant; failure to decrease pain; local, systemic, or serious CNS infections, intraspinal abscess with possible cord compression and paralysis, or life-threatening such as meningitis; bleeding; organ damage; nerve injury or damage with subsequent sensory, motor, and/or autonomic system dysfunction, resulting in transient or permanent pain, numbness, and/or weakness of one or several areas of the body; allergic reactions, either minor or major life-threatening, such as anaphylactic or anaphylactoid reactions.  Furthermore, Ms. Faraci was informed of those risks and complications associated with the medications. These include, but are not limited  to: allergic reactions (i.e.: anaphylactic or anaphylactoid reactions); endorphine suppression; bradycardia and/or hypotension; water retention and/or peripheral vascular relaxation leading to lower extremity edema and possible stasis ulcers; respiratory depression and/or shortness of breath; decreased metabolic rate leading to weight gain; swelling or edema; medication-induced neural toxicity; particulate matter embolism and blood vessel occlusion with resultant organ, and/or nervous system infarction; and/or intrathecal granuloma formation with possible spinal cord compression and permanent paralysis.  Before refilling the pump Ms. Glahn was informed that some of the medications used in the devise may not be FDA approved for such use and therefore it constitutes an off-label use of the medications.  Finally, she was informed that Medicine is not an exact science; therefore, there is also the possibility of unforeseen or unpredictable risks and/or possible complications that may result in a catastrophic outcome. The patient indicated having understood very clearly. We have given the patient no guarantees and we have made no promises. Enough time was given to the patient to ask questions, all of which were answered to the patient's satisfaction. Ms. Checo has indicated that she wanted to continue with the procedure. Attestation: I, the ordering provider, attest that I have discussed with the patient the benefits, risks, side-effects, alternatives, likelihood of achieving goals, and potential problems during recovery for the procedure that I have provided informed consent. Date  Time: 12/13/2018 10:36 AM  Pre-Procedure Preparation:  Monitoring: As per clinic protocol. Respiration, ETCO2, SpO2, BP, heart rate and rhythm monitor placed and checked for adequate function Safety Precautions: Patient was assessed for positional comfort and pressure points before starting the procedure. Time-out: I  initiated and conducted the "Time-out" before starting the procedure, as per protocol. The patient was asked to participate by confirming the accuracy of the "Time Out" information. Verification of the correct person, site, and procedure were performed and confirmed by me, the nursing staff, and the patient. "Time-out" conducted as per Joint  Commission's Universal Protocol (UP.01.01.01). Time: 1051  Description of Procedure:          Position: Supine Target Area: Central-port of intrathecal pump. Approach: Anterior, 90 degree angle approach. Area Prepped: Entire Area around the pump implant. Prepping solution: ChloraPrep (2% chlorhexidine gluconate and 70% isopropyl alcohol) Safety Precautions: Aspiration looking for blood return was conducted prior to all injections. At no point did we inject any substances, as a needle was being advanced. No attempts were made at seeking any paresthesias. Safe injection practices and needle disposal techniques used. Medications properly checked for expiration dates. SDV (single dose vial) medications used. Description of the Procedure: Protocol guidelines were followed. Two nurses trained to do implant refills were present during the entire procedure. The refill medication was checked by both healthcare providers as well as the patient. The patient was included in the "Time-out" to verify the medication. The patient was placed in position. The pump was identified. The area was prepped in the usual manner. The sterile template was positioned over the pump, making sure the side-port location matched that of the pump. Both, the pump and the template were held for stability. The needle provided in the Medtronic Kit was then introduced thru the center of the template and into the central port. The pump content was aspirated and discarded volume documented. The new medication was slowly infused into the pump, thru the filter, making sure to avoid overpressure of the device.  The needle was then removed and the area cleansed, making sure to leave some of the prepping solution back to take advantage of its long term bactericidal properties. The pump was interrogated and programmed to reflect the correct medication, volume, and dosage. The program was printed and taken to the physician for approval. Once checked and signed by the physician, a copy was provided to the patient and another scanned into the EMR. Vitals:   12/13/18 1035  BP: (!) 183/74  Pulse: 84  Resp: 16  Temp: 98 F (36.7 C)  TempSrc: Oral  SpO2: 98%  Weight: 176 lb (79.8 kg)  Height: '5\' 3"'  (1.6 m)    Start Time: 1053 hrs. End Time: 1104 hrs. Materials & Medications: Medtronic Refill Kit Medication(s): Please see chart orders for details.  Imaging Guidance:          Type of Imaging Technique: None used Indication(s): N/A Exposure Time: No patient exposure Contrast: None used. Fluoroscopic Guidance: N/A Ultrasound Guidance: N/A Interpretation: N/A  Antibiotic Prophylaxis:   Anti-infectives (From admission, onward)   None     Indication(s): None identified  Post-operative Assessment:  Post-procedure Vital Signs:  Pulse/HCG Rate: 84  Temp: 98 F (36.7 C) Resp: 16 BP: (!) 183/74 SpO2: 98 %  EBL: None  Complications: No immediate post-treatment complications observed by team, or reported by patient.  Note: The patient tolerated the entire procedure well. A repeat set of vitals were taken after the procedure and the patient was kept under observation following institutional policy, for this type of procedure. Post-procedural neurological assessment was performed, showing return to baseline, prior to discharge. The patient was provided with post-procedure discharge instructions, including a section on how to identify potential problems. Should any problems arise concerning this procedure, the patient was given instructions to immediately contact us, at any time, without hesitation. In  any case, we plan to contact the patient by telephone for a follow-up status report regarding this interventional procedure.  Comments:  No additional relevant information.  Plan of Care  Orders:  Orders Placed This Encounter  Procedures  . PUMP REFILL    Maintain Protocol by having two(2) healthcare providers during procedure and programming.    Scheduling Instructions:     Please refill intrathecal pump today.    Order Specific Question:   Where will this procedure be performed?    Answer:   [facility]  . PUMP REFILL    Whenever possible schedule on a procedure today.    Standing Status:   Future    Standing Expiration Date:   05/12/2019    Scheduling Instructions:     Please schedule intrathecal pump refill based on pump programming. Avoid schedule intervals of more than 120 days (4 months).    Order Specific Question:   Where will this procedure be performed?    Answer:   [facility]  . Ambulatory referral to Neurosurgery    Referral Priority:   Routine    Referral Type:   Surgical    Referral Reason:   Specialty Services Required    Referred to Provider:   [place]    Requested Specialty:   Neurosurgery    Number of Visits Requested:   1  . Informed Consent Details: Transcribe to consent form and obtain patient signature    Consent Attestation: I, the ordering provider, attest that I have discussed with the patient the benefits, risks, side-effects, alternatives, likelihood of achieving goals, and potential problems during recovery for the procedure that I have provided informed consent.    Scheduling Instructions:     Procedure: Intrathecal Pump Refill     Attending Physician: Beatriz Chancellor A. Dossie Arbour, MD     Indications: Chronic Pain Syndrome (G89.4)   Medications ordered for procedure: Meds ordered this encounter  Medications  . Oxycodone HCl 10 MG TABS    Sig: Take 1 tablet (10 mg total) by mouth every 6 (six) hours  as needed for up to 30 days. Max: 4/day. Must last 30 days.    Dispense:  120 tablet    Refill:  0    [place] STOP ACT - Not applicable to Chronic Pain Syndrome (G89.4) diagnosis. Fill one day early if pharmacy is closed on scheduled refill date. Do not fill until: 02/24/19. To last until: 03/26/19.  Marland Kitchen Oxycodone HCl 10 MG TABS    Sig: Take 1 tablet (10 mg total) by mouth every 6 (six) hours as needed for up to 30 days. Max: 4/day. Must last 30 days.    Dispense:  120 tablet    Refill:  0    [place] STOP ACT - Not applicable to Chronic Pain Syndrome (G89.4) diagnosis. Fill one day early if pharmacy is closed on scheduled refill date. Do not fill until: 01/25/19. To last until: 02/24/19.  Marland Kitchen Oxycodone HCl 10 MG TABS    Sig: Take 1 tablet (10 mg total) by mouth every 6 (six) hours as needed for up to 30 days. Max: 4/day. Must last 30 days.    Dispense:  120 tablet    Refill:  0    [place] STOP ACT - Not applicable to Chronic Pain Syndrome (G89.4) diagnosis. Fill one day early if pharmacy is closed on scheduled refill date. Do not fill until: 12/26/18. To last until: 01/25/19.   Medications administered: Hassan Rowan A. Haegele had no medications administered during this visit.  See the medical record for exact dosing, route, and time of administration.  Disposition: Discharge home  Discharge Date & Time: 12/13/2018; 1131(referral placed for  pump revision.  patient aware, ) hrs.   Follow-up plan:   Return for Pump Refill (as per pump program) (Max:28mo.     Future Appointments  Date Time Provider [place] 03/21/2019 11:00 AM [facility]ilinda Pointer [facility]   Primary Care Physician: System, Pcp Not In [facility] Note by: FGaspar Cola MD Date: 12/13/2018; Time: 12:12 PM  Disclaimer:  Medicine is not an exact science. The only guarantee in medicine is that nothing is guaranteed. It is important to note that the decision to proceed with this  intervention was based on the information collected from the patient. The Data and conclusions were drawn from the patient's questionnaire, the interview, and the physical examination. Because the information was provided in large part by the patient, it cannot be guaranteed that it has not been purposely or unconsciously manipulated. Every effort has been made to obtain as much relevant data as possible for this evaluation. It is important to note that the conclusions that lead to this procedure are derived in large part from the available data. Always take into account that the treatment will also be dependent on availability of resources and existing treatment guidelines, considered by other Pain Management Practitioners as being common knowledge and practice, at the time of the intervention. For Medico-Legal purposes, it is also important to point out that variation in procedural techniques and pharmacological choices are the acceptable norm. The indications, contraindications, technique, and results of the above procedure should only be interpreted and judged by a Board-Certified Interventional Pain Specialist with extensive familiarity and expertise in the same exact procedure and technique.

## 2018-12-14 ENCOUNTER — Encounter: Payer: Medicare Other | Admitting: Pain Medicine

## 2018-12-15 ENCOUNTER — Encounter: Payer: Medicare Other | Admitting: Pain Medicine

## 2018-12-19 ENCOUNTER — Telehealth: Payer: Self-pay

## 2018-12-19 NOTE — Telephone Encounter (Signed)
Arrange for patient to be referred to Dr. Gwynne Edinger at [facility] for replacement of intrathecal pump battery, ASAP. Per Dr Laban Emperor.  Thank you

## 2019-02-14 ENCOUNTER — Telehealth: Payer: Self-pay

## 2019-02-14 NOTE — Telephone Encounter (Signed)
Patient left a VM stating her machine is beeping and she feels sick and shaky. Please call

## 2019-02-14 NOTE — Telephone Encounter (Signed)
Left another message for patient to call office to discuss pump

## 2019-02-14 NOTE — Telephone Encounter (Signed)
Dr. Maryjean Ka CMA, [facility], called me back and said that they had been trying to get in touch with her, and had left several voicemails for her to call them back.

## 2019-02-14 NOTE — Telephone Encounter (Signed)
Spoke with patient and notified her that I had spoke to [place] at [employer] and that the pump should still be working but we needed to check it and stop the beeping.  Patient states the beeping she can hardly hear.  Informed patient that Dr Maryjean Ka office had been trying to reach her and had left her several messages.  She denies that she has gotten any messages from his office.  Patient states that she hurts all over.  Informed patient that we needed to check pump and reset alarm.  Patient states she would call Dr Maryjean Ka and let us know.

## 2019-02-14 NOTE — Telephone Encounter (Signed)
Called patient to discuss pump.  Instructed to call back asap so that we may discuss situation.

## 2019-03-01 ENCOUNTER — Other Ambulatory Visit: Payer: Self-pay

## 2019-03-01 MED ORDER — PAIN MANAGEMENT IT PUMP REFILL: 1.0000 | Freq: Once | INTRATHECAL | 0 refills | Status: AC

## 2019-03-02 ENCOUNTER — Other Ambulatory Visit: Payer: Self-pay

## 2019-03-02 MED ORDER — PAIN MANAGEMENT IT PUMP REFILL: 1.0000 | Freq: Once | INTRATHECAL | 0 refills | Status: AC

## 2019-03-13 DIAGNOSIS — R635 Abnormal weight gain: Secondary | ICD-10-CM | POA: Insufficient documentation

## 2019-03-15 ENCOUNTER — Telehealth: Payer: Self-pay

## 2019-03-15 NOTE — Telephone Encounter (Signed)
Left message for patient to get a covid test on Friday prior to pump refill

## 2019-03-16 ENCOUNTER — Telehealth: Payer: Self-pay | Admitting: Pain Medicine

## 2019-03-16 NOTE — Telephone Encounter (Signed)
Done and [place] notified.

## 2019-03-16 NOTE — Telephone Encounter (Signed)
Patient requested to change pump refill appt to 03-23-19 at 11:15/ please  Change in Pump Book

## 2019-03-21 ENCOUNTER — Encounter: Payer: Medicare HMO | Admitting: Pain Medicine

## 2019-03-23 ENCOUNTER — Encounter: Payer: Self-pay | Admitting: Pain Medicine

## 2019-03-23 ENCOUNTER — Other Ambulatory Visit: Payer: Self-pay

## 2019-03-23 ENCOUNTER — Ambulatory Visit: Payer: Medicare HMO | Attending: Pain Medicine | Admitting: Pain Medicine

## 2019-03-23 VITALS — BP 187/85 | HR 68 | Temp 98.5°F | Resp 18 | Ht 63.0 in | Wt 185.0 lb

## 2019-03-23 DIAGNOSIS — M5442 Lumbago with sciatica, left side: Secondary | ICD-10-CM | POA: Diagnosis present

## 2019-03-23 DIAGNOSIS — Z789 Other specified health status: Secondary | ICD-10-CM | POA: Insufficient documentation

## 2019-03-23 DIAGNOSIS — M899 Disorder of bone, unspecified: Secondary | ICD-10-CM | POA: Diagnosis present

## 2019-03-23 DIAGNOSIS — M5417 Radiculopathy, lumbosacral region: Secondary | ICD-10-CM | POA: Insufficient documentation

## 2019-03-23 DIAGNOSIS — M5136 Other intervertebral disc degeneration, lumbar region: Secondary | ICD-10-CM | POA: Diagnosis present

## 2019-03-23 DIAGNOSIS — Z978 Presence of other specified devices: Secondary | ICD-10-CM | POA: Insufficient documentation

## 2019-03-23 DIAGNOSIS — M79604 Pain in right leg: Secondary | ICD-10-CM | POA: Diagnosis present

## 2019-03-23 DIAGNOSIS — G8929 Other chronic pain: Secondary | ICD-10-CM | POA: Insufficient documentation

## 2019-03-23 DIAGNOSIS — M5441 Lumbago with sciatica, right side: Secondary | ICD-10-CM | POA: Insufficient documentation

## 2019-03-23 DIAGNOSIS — Z451 Encounter for adjustment and management of infusion pump: Secondary | ICD-10-CM | POA: Insufficient documentation

## 2019-03-23 DIAGNOSIS — Z79899 Other long term (current) drug therapy: Secondary | ICD-10-CM | POA: Diagnosis present

## 2019-03-23 DIAGNOSIS — G894 Chronic pain syndrome: Secondary | ICD-10-CM | POA: Insufficient documentation

## 2019-03-23 DIAGNOSIS — M79605 Pain in left leg: Secondary | ICD-10-CM | POA: Diagnosis present

## 2019-03-23 DIAGNOSIS — M961 Postlaminectomy syndrome, not elsewhere classified: Secondary | ICD-10-CM | POA: Diagnosis present

## 2019-03-23 MED ORDER — OXYCODONE HCL 10 MG PO TABS: 10.0000 mg | ORAL_TABLET | Freq: Four times a day (QID) | ORAL | 0 refills | Status: DC | PRN

## 2019-03-23 NOTE — Progress Notes (Signed)
Patient's Name: Kendra Nelson  MRN: [id number]  Referring Provider: No ref. provider found  DOB: 01/04/1961  PCP: System, Pcp Not In  DOS: 03/23/2019  Note by: Gaspar Cola, MD  Service setting: Ambulatory outpatient  Specialty: Interventional Pain Management  Patient type: Established  Location: [facility] (AMB) Pain Management Facility  Visit type: Interventional Procedure   Primary Reason for Visit: Interventional Pain Management Treatment. CC: Back Pain (upper, middle, lower)  Procedure:          Intrathecal Drug Delivery System (IDDS):  Type: Reservoir Refill [phone])       Region: Abdominal Laterality: Left  Type of Pump: [employer] Synchromed II Delivery Route: Intrathecal Type of Pain Treated: Neuropathic/Nociceptive Primary Medication Class: Opioid/opiate  Medication, Concentration, Infusion Program, & Delivery Rate: Please see scanned programming printout.   Indications: 1. 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 DDD (degenerative disc disease), lumbar   4. Chronic low back pain (Primary Area of Pain) (Bilateral) (R>L)   5. Chronic lower extremity pain (Secondary Area of Pain) (Bilateral) (R>L)   6. Chronic lumbosacral radiculopathy (L5) (Left)   7. Presence of intrathecal pump   8. Adjustment and management of infusion pump    Pain Assessment: Self-Reported Pain Score: 5 /10             Reported level is compatible with observation.        Pertinent Labs  COVID-19 screennig: No results found for: SARSCOV2NAA  NOTE: The patient is pending to have the intrathecal pump replaced since it has reached its "end-of-life".  She already has an appointment for this.  Pharmacotherapy Assessment  Analgesic: Oxycodone IR 56m 4x/day(473mdayof oxycodone) (60MME/day) + 357 mcg/day of intrathecal PF-Fentanyl (33 MME/day)(enough oral to last until 06/24/2019) MME/day: 60MME/day (oral) + 33 MME/day (intrathecal) = 93  MME/day (Aprox. 1.16 MME/day/kg).   Monitoring: Pharmacotherapy: No side-effects or adverse reactions reported. [place] PMP: PDMP reviewed during this encounter.       Compliance: No problems identified. Effectiveness: Clinically acceptable. Plan: Refer to "POC".  Intrathecal Pump Therapy Assessment  Manufacturer: [employer] Synchromed Type: Programmable Volume: 40 mL reservoir MRI compatibility: Yes   Drug content:  Primary Medication Class: Opioid Primary Medication: PF-Fentanyl Secondary Medication: see pump readout Other Medication: see pump readout   Programming:  Type: Simple continuous. See pump readout for details.   Changes:  Medication Change: None at this point Rate Change: No change in rate  Reported side-effects or adverse reactions: None reported  Effectiveness: Described as relatively effective, allowing for increase in activities of daily living (ADL) Clinically meaningful improvement in function (CMIF): Sustained CMIF goals met  Plan: Pump refill today  Pre-op Assessment:  Ms. Kendra Nelson a [age].o. (year old), female patient, seen today for interventional treatment. 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 Ms. MaSassamanas a current medication list which includes the following prescription(s): acitretin, atorvastatin, cyclobenzaprine, lisinopril, meloxicam, metoprolol tartrate, multivitamin with minerals, oxycodone hcl, oxycodone hcl, oxycodone hcl, and ropinirole. Her primarily concern today is the Back Pain (upper, middle, lower)  Initial Vital Signs:  Pulse/HCG Rate: 68  Temp: 98.5 F (36.9 C) Resp: 18 BP: (!) 187/85 SpO2: 100 %  BMI: Estimated body mass index is 32.77 kg/m as calculated from the  following:  Height as of this encounter: '5\' 3"'  (1.6 m).   Weight as of this encounter: 185 lb (83.9 kg).  Risk Assessment: Allergies: Reviewed. She is allergic to benadryl [diphenhydramine hcl] and bee venom.  Allergy Precautions: None required Coagulopathies: Reviewed. None identified.  Blood-thinner therapy: None at this time Active Infection(s): Reviewed. None identified. Kendra Nelson is afebrile  Site Confirmation: Kendra Nelson was asked to confirm the procedure and laterality before marking the site Procedure checklist: Completed Consent: Before the procedure and under the influence of no sedative(s), amnesic(s), or anxiolytics, the patient was informed of the treatment options, risks and possible complications. To fulfill our ethical and legal obligations, as recommended by the American Medical Association's Code of Ethics, I have informed the patient of my clinical impression; the nature and purpose of the treatment or procedure; the risks, benefits, and possible complications of the intervention; the alternatives, including doing nothing; the risk(s) and benefit(s) of the alternative treatment(s) or procedure(s); and the risk(s) and benefit(s) of doing nothing.  Kendra Nelson was provided with information about the general risks and possible complications associated with most interventional procedures. These include, but are not limited to: failure to achieve desired goals, infection, bleeding, organ or nerve damage, allergic reactions, paralysis, and/or death.  In addition, she was informed of those risks and possible complications associated to this particular procedure, which include, but are not limited to: damage to the implant; failure to decrease pain; local, systemic, or serious CNS infections, intraspinal abscess with possible cord compression and paralysis, or life-threatening such as meningitis; bleeding; organ damage; nerve injury or damage with subsequent sensory, motor,  and/or autonomic system dysfunction, resulting in transient or permanent pain, numbness, and/or weakness of one or several areas of the body; allergic reactions, either minor or major life-threatening, such as anaphylactic or anaphylactoid reactions.  Furthermore, Ms. Koenig was informed of those risks and complications associated with the medications. These include, but are not limited to: allergic reactions (i.e.: anaphylactic or anaphylactoid reactions); endorphine suppression; bradycardia and/or hypotension; water retention and/or peripheral vascular relaxation leading to lower extremity edema and possible stasis ulcers; respiratory depression and/or shortness of breath; decreased metabolic rate leading to weight gain; swelling or edema; medication-induced neural toxicity; particulate matter embolism and blood vessel occlusion with resultant organ, and/or nervous system infarction; and/or intrathecal granuloma formation with possible spinal cord compression and permanent paralysis.  Before refilling the pump Ms. Houseworth was informed that some of the medications used in the devise may not be FDA approved for such use and therefore it constitutes an off-label use of the medications.  Finally, she was informed that Medicine is not an exact science; therefore, there is also the possibility of unforeseen or unpredictable risks and/or possible complications that may result in a catastrophic outcome. The patient indicated having understood very clearly. We have given the patient no guarantees and we have made no promises. Enough time was given to the patient to ask questions, all of which were answered to the patient's satisfaction. Ms. Batch has indicated that she wanted to continue with the procedure. Attestation: I, the ordering provider, attest that I have discussed with the patient the benefits, risks, side-effects, alternatives, likelihood of achieving goals, and potential problems during recovery  for the procedure that I have provided informed consent. Date  Time: 03/23/2019 11:18 AM  Pre-Procedure Preparation:  Monitoring: As per clinic protocol. Respiration, ETCO2, SpO2, BP, heart rate and rhythm monitor placed and checked for adequate function Safety Precautions: Patient was assessed for positional  comfort and pressure points before starting the procedure. Time-out: I initiated and conducted the "Time-out" before starting the procedure, as per protocol. The patient was asked to participate by confirming the accuracy of the "Time Out" information. Verification of the correct person, site, and procedure were performed and confirmed by me, the nursing staff, and the patient. "Time-out" conducted as per Joint Commission's Universal Protocol (UP.01.01.01). Time: 1150  Description of Procedure:          Position: Supine Target Area: Central-port of intrathecal pump. Approach: Anterior, 90 degree angle approach. Area Prepped: Entire Area around the pump implant. Prepping solution: DuraPrep (Iodine Povacrylex [0.7% available iodine] and Isopropyl Alcohol, 74% w/w) Safety Precautions: Aspiration looking for blood return was conducted prior to all injections. At no point did we inject any substances, as a needle was being advanced. No attempts were made at seeking any paresthesias. Safe injection practices and needle disposal techniques used. Medications properly checked for expiration dates. SDV (single dose vial) medications used. Description of the Procedure: Protocol guidelines were followed. Two nurses trained to do implant refills were present during the entire procedure. The refill medication was checked by both healthcare providers as well as the patient. The patient was included in the "Time-out" to verify the medication. The patient was placed in position. The pump was identified. The area was prepped in the usual manner. The sterile template was positioned over the pump, making sure the  side-port location matched that of the pump. Both, the pump and the template were held for stability. The needle provided in the [employer] Kit was then introduced thru the center of the template and into the central port. The pump content was aspirated and discarded volume documented. The new medication was slowly infused into the pump, thru the filter, making sure to avoid overpressure of the device. The needle was then removed and the area cleansed, making sure to leave some of the prepping solution back to take advantage of its long term bactericidal properties. The pump was interrogated and programmed to reflect the correct medication, volume, and dosage. The program was printed and taken to the physician for approval. Once checked and signed by the physician, a copy was provided to the patient and another scanned into the EMR. Vitals:   03/23/19 1150  BP: (!) 187/85  Pulse: 68  Resp: 18  Temp: 98.5 F (36.9 C)  TempSrc: Oral  SpO2: 100%  Weight: 185 lb (83.9 kg)  Height: '5\' 3"'  (1.6 m)    Start Time: 1205 hrs. End Time: 1118 hrs. Materials & Medications: [employer] Refill Kit Medication(s): Please see chart orders for details.  Imaging Guidance:          Type of Imaging Technique: None used Indication(s): N/A Exposure Time: No patient exposure Contrast: None used. Fluoroscopic Guidance: N/A Ultrasound Guidance: N/A Interpretation: N/A  Antibiotic Prophylaxis:   Anti-infectives (From admission, onward)   None     Indication(s): None identified  Post-operative Assessment:  Post-procedure Vital Signs:  Pulse/HCG Rate: 68  Temp: 98.5 F (36.9 C) Resp: 18 BP: (!) 187/85 SpO2: 100 %  EBL: None  Complications: No immediate post-treatment complications observed by team, or reported by patient.  Note: The patient tolerated the entire procedure well. A repeat set of vitals were taken after the procedure and the patient was kept under observation following institutional  policy, for this type of procedure. Post-procedural neurological assessment was performed, showing return to baseline, prior to discharge. The patient was provided with post-procedure discharge  instructions, including a section on how to identify potential problems. Should any problems arise concerning this procedure, the patient was given instructions to immediately contact us, at any time, without hesitation. In any case, we plan to contact the patient by telephone for a follow-up status report regarding this interventional procedure.  Comments:  No additional relevant information.  Plan of Care  Orders:  Orders Placed This Encounter  Procedures  . PUMP REFILL    Maintain Protocol by having two(2) healthcare providers during procedure and programming.    Scheduling Instructions:     Please refill intrathecal pump today.    Order Specific Question:   Where will this procedure be performed?    Answer:   [facility]  . PUMP REFILL    Whenever possible schedule on a procedure today.    Standing Status:   Future    Standing Expiration Date:   08/20/2019    Scheduling Instructions:     Please schedule intrathecal pump refill based on pump programming. Avoid schedule intervals of more than 120 days (4 months).    Order Specific Question:   Where will this procedure be performed?    Answer:   [facility]  . ToxASSURE Select 13 (MW), Urine    Volume: 30 ml(s). Minimum 3 ml of urine is needed. Document temperature of fresh sample. Indications: Long term (current) use of opiate analgesic (Z79.891)  . Comp. Metabolic Panel (12)    With GFR. Indications: Chronic Pain Syndrome (G89.4) & Pharmacotherapy (Q30.092)    Order Specific Question:   Has the patient fasted?    Answer:   No    Order Specific Question:   CC Results    Answer:   PCP-NURSE [[id number]]  . Magnesium    Indication: Pharmacologic therapy (A26.333)    Order Specific Question:   CC Results    Answer:   PCP-NURSE  [[id number]]  . Vitamin B12    Indication: Pharmacologic therapy (W38.937).    Order Specific Question:   CC Results    Answer:   PCP-NURSE [[id number]]  . 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    Order Specific Question:   CC Results    Answer:   PCP-NURSE [[id number]]  . C-reactive protein    Indication: Problems influencing health status (Z78.9)    Order Specific Question:   CC Results    Answer:   PCP-NURSE [[id number]]  . Informed Consent Details: Transcribe to consent form and obtain patient signature    Consent Attestation: I, the ordering provider, attest that I have discussed with the patient the benefits, risks, side-effects, alternatives, likelihood of achieving goals, and potential problems during recovery for the procedure that I have provided informed consent.    Scheduling Instructions:     Procedure: Intrathecal Pump Refill     Attending Physician: Beatriz Chancellor A. Dossie Arbour, MD     Indications: Chronic Pain Syndrome (G89.4)   Chronic Opioid Analgesic:  Oxycodone IR 61m 4x/day(451mdayof oxycodone) (60MME/day) + 357 mcg/day of intrathecal PF-Fentanyl (33 MME/day)(enough oral to last until 06/24/2019) MME/day: 60MME/day (oral) + 33 MME/day (intrathecal) = 93 MME/day (Aprox. 1.16 MME/day/kg).   Medications ordered for procedure: Meds ordered this encounter  Medications  . Oxycodone HCl 10 MG TABS    Sig: Take 1 tablet (  10 mg total) by mouth every 6 (six) hours as needed. Max: 4/day. Must last 30 days.    Dispense:  120 tablet    Refill:  0    Chronic Pain: STOP Act (Not applicable) Fill 1 day early if closed on refill date. Do not fill until: 03/26/2019. To last until: 04/25/2019. Avoid benzodiazepines within 8 hours of opioids  . Oxycodone HCl 10 MG TABS    Sig: Take 1 tablet (10 mg total) by  mouth every 6 (six) hours as needed. Max: 4/day. Must last 30 days.    Dispense:  120 tablet    Refill:  0    Chronic Pain: STOP Act (Not applicable) Fill 1 day early if closed on refill date. Do not fill until: 04/25/2019. To last until: 05/25/2019. Avoid benzodiazepines within 8 hours of opioids  . Oxycodone HCl 10 MG TABS    Sig: Take 1 tablet (10 mg total) by mouth every 6 (six) hours as needed. Max: 4/day. Must last 30 days.    Dispense:  120 tablet    Refill:  0    Chronic Pain: STOP Act (Not applicable) Fill 1 day early if closed on refill date. Do not fill until: 05/25/2019. To last until: 06/24/2019. Avoid benzodiazepines within 8 hours of opioids   Medications administered: Hassan Rowan A. Coco had no medications administered during this visit.  See the medical record for exact dosing, route, and time of administration.  Follow-up plan:   Return for Pump Refill (Max:14mo.       Interventional management options: Planned, scheduled, and/or pending:   Intrathecal pump refill as per pump programming.   Considering:   Diagnostic left intra-articular knee joint aspiration and injection of local anesthetic and steroid  Diagnostic bilateral L2-3 transforaminal epidural steroid injection  Diagnostic left genicular nerve block  Possible left genicular nerve RFA  Palliative/therapeutic intrathecal pump analysis and refill Diagnostic caudal epidural steroid injection + diagnostic epidurogram Possible Racz procedure Diagnostic bilateral lumbar facet block Possible bilateral lumbar facet RFA (unlikely due to extensive lumbar fusion from L3-S1)    Palliative PRN treatment(s):   None at this time    Recent Visits No visits were found meeting these conditions.  Showing recent visits within past 90 days and meeting all other requirements   Today's Visits Date Type Provider Dept  03/23/19 Procedure visit NMilinda Pointer MD [facility]  Showing today's visits and  meeting all other requirements   Future Appointments No visits were found meeting these conditions.  Showing future appointments within next 90 days and meeting all other requirements   Disposition: Discharge home  Discharge Date & Time: 03/23/2019; 1230 hrs.   Primary Care Physician: System, Pcp Not In [facility] Note by: FGaspar Cola MD Date: 03/23/2019; Time: 1:15 PM  Disclaimer:  Medicine is not an eChief Strategy Officer The only guarantee in medicine is that nothing is guaranteed. It is important to note that the decision to proceed with this intervention was based on the information collected from the patient. The Data and conclusions were drawn from the patient's questionnaire, the interview, and the physical examination. Because the information was provided in large part by the patient, it cannot be guaranteed that it has not been purposely or unconsciously manipulated. Every effort has been made to obtain as much relevant data as possible for this evaluation. It is important to note that the conclusions that lead to this procedure are derived in large part from the available  data. Always take into account that the treatment will also be dependent on availability of resources and existing treatment guidelines, considered by other Pain Management Practitioners as being common knowledge and practice, at the time of the intervention. For Medico-Legal purposes, it is also important to point out that variation in procedural techniques and pharmacological choices are the acceptable norm. The indications, contraindications, technique, and results of the above procedure should only be interpreted and judged by a Board-Certified Interventional Pain Specialist with extensive familiarity and expertise in the same exact procedure and technique.

## 2019-03-23 NOTE — Patient Instructions (Signed)
____________________________________________________________________________________________  Drug Holidays (Slow)  What is a "Drug Holiday"? Drug Holiday: is the name given to the period of time during which a patient stops taking a medication(s) for the purpose of eliminating tolerance to the drug.  Benefits . Improved effectiveness of opioids. . Decreased opioid dose needed to achieve benefits. . Improved pain with lesser dose.  What is tolerance? Tolerance: is the progressive decreased in effectiveness of a drug due to its repetitive use. With repetitive use, the body gets use to the medication and as a consequence, it loses its effectiveness. This is a common problem seen with opioid pain medications. As a result, a larger dose of the drug is needed to achieve the same effect that used to be obtained with a smaller dose.  How long should a "Drug Holiday" last? You should stay off of the pain medicine for at least 14 consecutive days. (2 weeks)  Should I stop the medicine "cold turkey"? No. You should always coordinate with your Pain Specialist so that he/she can provide you with the correct medication dose to make the transition as smoothly as possible.  How do I stop the medicine? Slowly. You will be instructed to decrease the daily amount of pills that you take by one (1) pill every seven (7) days. This is called a "slow downward taper" of your dose. For example: if you normally take four (4) pills per day, you will be asked to drop this dose to three (3) pills per day for seven (7) days, then to two (2) pills per day for seven (7) days, then to one (1) per day for seven (7) days, and at the end of those last seven (7) days, this is when the "Drug Holiday" would start.   Will I have withdrawals? By doing a "slow downward taper" like this one, it is unlikely that you will experience any significant withdrawal symptoms. Typically, what triggers withdrawals is the sudden stop of a high  dose opioid therapy. Withdrawals can usually be avoided by slowly decreasing the dose over a prolonged period of time.  What are withdrawals? Withdrawals: refers to the wide range of symptoms that occur after stopping or dramatically reducing opiate drugs after heavy and prolonged use. Withdrawal symptoms do not occur to patients that use low dose opioids, or those who take the medication sporadically. Contrary to benzodiazepine (example: Valium, Xanax, etc.) or alcohol withdrawals ("Delirium Tremens"), opioid withdrawals are not lethal. Withdrawals are the physical manifestation of the body getting rid of the excess receptors.  Expected Symptoms Early symptoms of withdrawal may include: . Agitation . Anxiety . Muscle aches . Increased tearing . Insomnia . Runny nose . Sweating . Yawning  Late symptoms of withdrawal may include: . Abdominal cramping . Diarrhea . Dilated pupils . Goose bumps . Nausea . Vomiting  Will I experience withdrawals? Due to the slow nature of the taper, it is very unlikely that you will experience any.  What is a slow taper? Taper: refers to the gradual decrease in dose.  ___________________________________________________________________________________________     

## 2019-03-24 ENCOUNTER — Telehealth: Payer: Self-pay | Admitting: *Deleted

## 2019-03-24 NOTE — Telephone Encounter (Signed)
Voicemail left with patient to call our office if there are questions or concerns re; pump refill on yesterday.

## 2019-03-27 LAB — SEDIMENTATION RATE: Sed Rate: 34 mm/hr (ref 0–40)

## 2019-03-27 LAB — COMP. METABOLIC PANEL (12)
AST: 37 IU/L (ref 0–40)
Albumin/Globulin Ratio: 1.7 (ref 1.2–2.2)
Albumin: 4.3 g/dL (ref 3.8–4.9)
Alkaline Phosphatase: 124 IU/L — ABNORMAL HIGH (ref 39–117)
BUN/Creatinine Ratio: 17 (ref 9–23)
BUN: 16 mg/dL (ref 6–24)
Bilirubin Total: 0.4 mg/dL (ref 0.0–1.2)
Calcium: 9.4 mg/dL (ref 8.7–10.2)
Chloride: 99 mmol/L (ref 96–106)
Creatinine, Ser: 0.96 mg/dL (ref 0.57–1.00)
GFR calc Af Amer: 76 mL/min/{1.73_m2} (ref >59)
GFR calc non Af Amer: 66 mL/min/{1.73_m2} (ref >59)
Globulin, Total: 2.6 g/dL (ref 1.5–4.5)
Glucose: 85 mg/dL (ref 65–99)
Potassium: 4.6 mmol/L (ref 3.5–5.2)
Sodium: 139 mmol/L (ref 134–144)
Total Protein: 6.9 g/dL (ref 6.0–8.5)

## 2019-03-27 LAB — 25-HYDROXY VITAMIN D LCMS D2+D3
25-Hydroxy, Vitamin D-2: <1 ng/mL
25-Hydroxy, Vitamin D-3: 18 ng/mL
25-Hydroxy, Vitamin D: 18 ng/mL — ABNORMAL LOW

## 2019-03-27 LAB — C-REACTIVE PROTEIN: CRP: 4 mg/L (ref 0–10)

## 2019-03-27 LAB — VITAMIN B12: Vitamin B-12: 514 pg/mL (ref 232–1245)

## 2019-03-27 LAB — MAGNESIUM: Magnesium: 2.3 mg/dL (ref 1.6–2.3)

## 2019-03-27 LAB — TOXASSURE SELECT 13 (MW), URINE

## 2019-04-03 MED FILL — Medication: INTRATHECAL | Qty: 1 | Status: AC

## 2019-05-03 ENCOUNTER — Other Ambulatory Visit: Payer: Self-pay | Admitting: Anesthesiology

## 2019-05-04 ENCOUNTER — Other Ambulatory Visit: Payer: Self-pay

## 2019-05-04 ENCOUNTER — Encounter (HOSPITAL_COMMUNITY): Payer: Self-pay | Admitting: *Deleted

## 2019-05-04 NOTE — Progress Notes (Signed)
Pt denies SOB, chest pain, and being under the care of a cardiologist. Pt stated that she is under the care of Eldridge Abrahams, NP, PCP. Pt denies recent labs. Pt made aware to stop taking Aspirin (unless otherwise advised by surgeon), vitamins, fish oil and herbal medications. Do not take any NSAIDs ie: Ibuprofen, Advil, Naproxen (Aleve), Motrin, BC and Goody Powder. Pt denies having an echo and cardiac cath but stated that a stress test was performed > 5 years ago. Pt verbalized understanding of all pre-op instructions.

## 2019-05-05 ENCOUNTER — Other Ambulatory Visit (HOSPITAL_COMMUNITY)
Admission: RE | Admit: 2019-05-05 | Discharge: 2019-05-05 | Disposition: A | Discharge status: Home / Self Care | Payer: Medicare HMO | Source: Ambulatory Visit | Attending: Anesthesiology | Admitting: Anesthesiology

## 2019-05-05 DIAGNOSIS — Z20828 Contact with and (suspected) exposure to other viral communicable diseases: Secondary | ICD-10-CM | POA: Insufficient documentation

## 2019-05-05 DIAGNOSIS — Z01812 Encounter for preprocedural laboratory examination: Secondary | ICD-10-CM | POA: Diagnosis present

## 2019-05-06 LAB — NOVEL CORONAVIRUS, NAA (HOSP ORDER, SEND-OUT TO REF LAB; TAT 18-24 HRS): SARS-CoV-2, NAA: NOT DETECTED

## 2019-05-09 ENCOUNTER — Encounter (HOSPITAL_COMMUNITY): Payer: Self-pay | Admitting: Certified Registered"

## 2019-05-09 ENCOUNTER — Encounter (HOSPITAL_COMMUNITY)
Admission: RE | Disposition: A | Discharge status: Home / Self Care | Payer: Self-pay | Source: Home / Self Care | Attending: Anesthesiology

## 2019-05-09 ENCOUNTER — Ambulatory Visit (HOSPITAL_COMMUNITY)
Admission: RE | Admit: 2019-05-09 | Discharge: 2019-05-09 | Disposition: A | Discharge status: Home / Self Care | Payer: Medicare HMO | Attending: Anesthesiology | Admitting: Anesthesiology

## 2019-05-09 ENCOUNTER — Other Ambulatory Visit: Payer: Self-pay

## 2019-05-09 ENCOUNTER — Ambulatory Visit (HOSPITAL_COMMUNITY): Payer: Medicare HMO | Admitting: Certified Registered"

## 2019-05-09 DIAGNOSIS — Z87891 Personal history of nicotine dependence: Secondary | ICD-10-CM | POA: Insufficient documentation

## 2019-05-09 DIAGNOSIS — Z96652 Presence of left artificial knee joint: Secondary | ICD-10-CM | POA: Insufficient documentation

## 2019-05-09 DIAGNOSIS — Z79899 Other long term (current) drug therapy: Secondary | ICD-10-CM | POA: Diagnosis not present

## 2019-05-09 DIAGNOSIS — I10 Essential (primary) hypertension: Secondary | ICD-10-CM | POA: Diagnosis not present

## 2019-05-09 DIAGNOSIS — Z4549 Encounter for adjustment and management of other implanted nervous system device: Secondary | ICD-10-CM | POA: Diagnosis not present

## 2019-05-09 DIAGNOSIS — G8929 Other chronic pain: Secondary | ICD-10-CM | POA: Insufficient documentation

## 2019-05-09 DIAGNOSIS — G894 Chronic pain syndrome: Secondary | ICD-10-CM

## 2019-05-09 DIAGNOSIS — G2581 Restless legs syndrome: Secondary | ICD-10-CM | POA: Diagnosis not present

## 2019-05-09 DIAGNOSIS — M961 Postlaminectomy syndrome, not elsewhere classified: Secondary | ICD-10-CM | POA: Diagnosis not present

## 2019-05-09 DIAGNOSIS — E785 Hyperlipidemia, unspecified: Secondary | ICD-10-CM | POA: Diagnosis not present

## 2019-05-09 HISTORY — DX: Restless legs syndrome: G25.81

## 2019-05-09 HISTORY — DX: Presence of spectacles and contact lenses: Z97.3

## 2019-05-09 HISTORY — PX: INTRATHECAL PUMP REVISION: SHX6810

## 2019-05-09 SURGERY — INTRATHECAL PUMP REVISION
Anesthesia: General

## 2019-05-09 MED ORDER — LIDOCAINE 2% (20 MG/ML) 5 ML SYRINGE
INTRAMUSCULAR | Status: DC | PRN
  Administered 2019-05-09: 60 mg via INTRAVENOUS

## 2019-05-09 MED ORDER — FENTANYL CITRATE (PF) 250 MCG/5ML IJ SOLN
INTRAMUSCULAR | Status: AC
  Filled 2019-05-09: qty 5

## 2019-05-09 MED ORDER — PROPOFOL 10 MG/ML IV BOLUS
INTRAVENOUS | Status: DC | PRN
  Administered 2019-05-09: 200 mg via INTRAVENOUS

## 2019-05-09 MED ORDER — LACTATED RINGERS IV SOLN
INTRAVENOUS | Status: DC
  Administered 2019-05-09: 12:00:00 via INTRAVENOUS

## 2019-05-09 MED ORDER — PROPOFOL 10 MG/ML IV BOLUS
INTRAVENOUS | Status: AC
  Filled 2019-05-09: qty 20

## 2019-05-09 MED ORDER — CHLORHEXIDINE GLUCONATE CLOTH 2 % EX PADS: 6.0000 | MEDICATED_PAD | Freq: Once | CUTANEOUS | Status: DC

## 2019-05-09 MED ORDER — FENTANYL CITRATE (PF) 100 MCG/2ML IJ SOLN
25.0000 ug | INTRAMUSCULAR | Status: DC | PRN
  Administered 2019-05-09 (×2): 50 ug via INTRAVENOUS

## 2019-05-09 MED ORDER — PHENYLEPHRINE 40 MCG/ML (10ML) SYRINGE FOR IV PUSH (FOR BLOOD PRESSURE SUPPORT)
PREFILLED_SYRINGE | INTRAVENOUS | Status: AC
  Filled 2019-05-09: qty 10

## 2019-05-09 MED ORDER — MIDAZOLAM HCL 5 MG/5ML IJ SOLN
INTRAMUSCULAR | Status: DC | PRN
  Administered 2019-05-09: 2 mg via INTRAVENOUS

## 2019-05-09 MED ORDER — DEXAMETHASONE SODIUM PHOSPHATE 10 MG/ML IJ SOLN
INTRAMUSCULAR | Status: AC
  Filled 2019-05-09: qty 1

## 2019-05-09 MED ORDER — FENTANYL CITRATE (PF) 100 MCG/2ML IJ SOLN
INTRAMUSCULAR | Status: DC | PRN
  Administered 2019-05-09 (×3): 50 ug via INTRAVENOUS

## 2019-05-09 MED ORDER — SODIUM CHLORIDE 0.9 % IV SOLN
INTRAVENOUS | Status: DC | PRN
  Administered 2019-05-09: 500 mL

## 2019-05-09 MED ORDER — GLYCOPYRROLATE PF 0.2 MG/ML IJ SOSY
PREFILLED_SYRINGE | INTRAMUSCULAR | Status: AC
  Filled 2019-05-09: qty 1

## 2019-05-09 MED ORDER — CEFAZOLIN SODIUM-DEXTROSE 2-4 GM/100ML-% IV SOLN
2.0000 g | INTRAVENOUS | Status: AC
  Administered 2019-05-09: 2 g via INTRAVENOUS
  Filled 2019-05-09: qty 100

## 2019-05-09 MED ORDER — PHENYLEPHRINE 40 MCG/ML (10ML) SYRINGE FOR IV PUSH (FOR BLOOD PRESSURE SUPPORT)
PREFILLED_SYRINGE | INTRAVENOUS | Status: DC | PRN
  Administered 2019-05-09 (×4): 80 ug via INTRAVENOUS

## 2019-05-09 MED ORDER — GLYCOPYRROLATE PF 0.2 MG/ML IJ SOSY
PREFILLED_SYRINGE | INTRAMUSCULAR | Status: DC | PRN
  Administered 2019-05-09: .1 mg via INTRAVENOUS

## 2019-05-09 MED ORDER — ONDANSETRON HCL 4 MG/2ML IJ SOLN
INTRAMUSCULAR | Status: DC | PRN
  Administered 2019-05-09: 4 mg via INTRAVENOUS

## 2019-05-09 MED ORDER — FENTANYL CITRATE (PF) 100 MCG/2ML IJ SOLN
INTRAMUSCULAR | Status: AC
  Filled 2019-05-09: qty 2

## 2019-05-09 MED ORDER — NON FORMULARY
Status: DC | PRN
  Administered 2019-05-09: 13:00:00 40 mL via INTRATHECAL

## 2019-05-09 MED ORDER — 0.9 % SODIUM CHLORIDE (POUR BTL) OPTIME
TOPICAL | Status: DC | PRN
  Administered 2019-05-09: 13:00:00 1000 mL

## 2019-05-09 MED ORDER — ACETAMINOPHEN 500 MG PO TABS: 1000.0000 mg | ORAL_TABLET | Freq: Once | ORAL | Status: DC

## 2019-05-09 MED ORDER — ONDANSETRON HCL 4 MG/2ML IJ SOLN
INTRAMUSCULAR | Status: AC
  Filled 2019-05-09: qty 2

## 2019-05-09 MED ORDER — OXYCODONE HCL 10 MG PO TABS: 10.0000 mg | ORAL_TABLET | ORAL | 0 refills | Status: DC | PRN

## 2019-05-09 MED ORDER — BUPIVACAINE-EPINEPHRINE (PF) 0.5% -1:200000 IJ SOLN
INTRAMUSCULAR | Status: AC
  Filled 2019-05-09: qty 30

## 2019-05-09 MED ORDER — MIDAZOLAM HCL 2 MG/2ML IJ SOLN
INTRAMUSCULAR | Status: AC
  Filled 2019-05-09: qty 2

## 2019-05-09 MED ORDER — BUPIVACAINE-EPINEPHRINE (PF) 0.5% -1:200000 IJ SOLN
INTRAMUSCULAR | Status: DC | PRN
  Administered 2019-05-09: 8 mL via PERINEURAL
  Administered 2019-05-09: 9 mL via PERINEURAL

## 2019-05-09 MED ORDER — LACTATED RINGERS IV SOLN
INTRAVENOUS | Status: DC | PRN
  Administered 2019-05-09: 12:00:00 via INTRAVENOUS

## 2019-05-09 MED ORDER — LIDOCAINE 2% (20 MG/ML) 5 ML SYRINGE
INTRAMUSCULAR | Status: AC
  Filled 2019-05-09: qty 5

## 2019-05-09 MED ORDER — DEXAMETHASONE SODIUM PHOSPHATE 4 MG/ML IJ SOLN
INTRAMUSCULAR | Status: DC | PRN
  Administered 2019-05-09: 8 mg via INTRAVENOUS

## 2019-05-09 SURGICAL SUPPLY — 60 items
ADH SKN CLS APL DERMABOND .7 (GAUZE/BANDAGES/DRESSINGS) ×1
APL PRP STRL LF DISP 70% ISPRP (MISCELLANEOUS) ×1
BAG DECANTER FOR FLEXI CONT (MISCELLANEOUS) ×2 IMPLANT
BINDER ABDOMINAL 12 ML 46-62 (SOFTGOODS) ×1 IMPLANT
BLADE CLIPPER SURG (BLADE) IMPLANT
BOOT SUTURE AID YELLOW STND (SUTURE) IMPLANT
CHLORAPREP W/TINT 26 (MISCELLANEOUS) ×2 IMPLANT
COVER WAND RF STERILE (DRAPES) ×2 IMPLANT
DERMABOND ADVANCED (GAUZE/BANDAGES/DRESSINGS) ×1
DERMABOND ADVANCED .7 DNX12 (GAUZE/BANDAGES/DRESSINGS) IMPLANT
DRAPE LAPAROTOMY 100X72X124 (DRAPES) ×2 IMPLANT
DRAPE POUCH INSTRU U-SHP 10X18 (DRAPES) ×1 IMPLANT
DRAPE SURG 17X23 STRL (DRAPES) ×4 IMPLANT
DRSG COVADERM 4X8 (GAUZE/BANDAGES/DRESSINGS) ×2 IMPLANT
DRSG OPSITE POSTOP 4X6 (GAUZE/BANDAGES/DRESSINGS) ×1 IMPLANT
ELECT REM PT RETURN 9FT ADLT (ELECTROSURGICAL) ×2
ELECTRODE REM PT RTRN 9FT ADLT (ELECTROSURGICAL) ×1 IMPLANT
GAUZE 4X4 16PLY RFD (DISPOSABLE) IMPLANT
GLOVE BIO SURGEON STRL SZ 6.5 (GLOVE) ×2 IMPLANT
GLOVE BIOGEL PI IND STRL 6.5 (GLOVE) IMPLANT
GLOVE BIOGEL PI IND STRL 7.5 (GLOVE) IMPLANT
GLOVE BIOGEL PI INDICATOR 6.5 (GLOVE) ×1
GLOVE BIOGEL PI INDICATOR 7.5 (GLOVE) ×1
GLOVE ECLIPSE 7.5 STRL STRAW (GLOVE) ×2 IMPLANT
GLOVE EXAM NITRILE LRG STRL (GLOVE) IMPLANT
GLOVE EXAM NITRILE XL STR (GLOVE) IMPLANT
GLOVE EXAM NITRILE XS STR PU (GLOVE) IMPLANT
GOWN STRL REUS W/ TWL LRG LVL3 (GOWN DISPOSABLE) IMPLANT
GOWN STRL REUS W/ TWL XL LVL3 (GOWN DISPOSABLE) IMPLANT
GOWN STRL REUS W/TWL 2XL LVL3 (GOWN DISPOSABLE) IMPLANT
GOWN STRL REUS W/TWL LRG LVL3 (GOWN DISPOSABLE) ×4
GOWN STRL REUS W/TWL XL LVL3 (GOWN DISPOSABLE)
KIT BASIN OR (CUSTOM PROCEDURE TRAY) ×2 IMPLANT
KIT REFILL (MISCELLANEOUS) ×1
KIT REFILL CATH SYNCHROMED II (MISCELLANEOUS) IMPLANT
KIT TURNOVER KIT B (KITS) ×2 IMPLANT
NDL 18GX1X1/2 (RX/OR ONLY) (NEEDLE) IMPLANT
NDL HYPO 25X1 1.5 SAFETY (NEEDLE) ×1 IMPLANT
NEEDLE 18GX1X1/2 (RX/OR ONLY) (NEEDLE) IMPLANT
NEEDLE HYPO 25X1 1.5 SAFETY (NEEDLE) ×2 IMPLANT
NS IRRIG 1000ML POUR BTL (IV SOLUTION) ×2 IMPLANT
PACK LAMINECTOMY NEURO (CUSTOM PROCEDURE TRAY) ×2 IMPLANT
PAD ARMBOARD 7.5X6 YLW CONV (MISCELLANEOUS) ×2 IMPLANT
POUCH TYRX ANTIBAC NEURO LRG (Mesh General) IMPLANT
POUCH TYRX NEURO LRG (Mesh General) ×2 IMPLANT
PUMP SYNCHROMED II 40ML RESVR (Neuro Prosthesis/Implant) ×1 IMPLANT
SPONGE LAP 4X18 RFD (DISPOSABLE) IMPLANT
SPONGE SURGIFOAM ABS GEL SZ50 (HEMOSTASIS) IMPLANT
STAPLER SKIN PROX WIDE 3.9 (STAPLE) ×2 IMPLANT
SUT MNCRL AB 4-0 PS2 18 (SUTURE) ×2 IMPLANT
SUT SILK 0 (SUTURE) ×2
SUT SILK 0 MO-6 18XCR BRD 8 (SUTURE) IMPLANT
SUT SILK 2 0 PERMA HAND 18 BK (SUTURE) ×2 IMPLANT
SUT VIC AB 2-0 CP2 18 (SUTURE) ×4 IMPLANT
SYR 20ML LL LF (SYRINGE) ×1 IMPLANT
SYR 3ML LL SCALE MARK (SYRINGE) ×1 IMPLANT
TOWEL GREEN STERILE (TOWEL DISPOSABLE) ×2 IMPLANT
TOWEL GREEN STERILE FF (TOWEL DISPOSABLE) ×2 IMPLANT
WATER STERILE IRR 1000ML POUR (IV SOLUTION) ×2 IMPLANT
YANKAUER SUCT BULB TIP NO VENT (SUCTIONS) ×2 IMPLANT

## 2019-05-09 NOTE — Op Note (Signed)
PREOP DX: chronic pain treated with intrathecal drug therapy; IT pump at end of battery life POSTOP DX: same PROCEDURE: replacement of IT pump SURGEON: Cailen Texeira ASSISTANT: none ANESTHESIA: GETA  FINDINGS: intact catheter, EBL: minimal DESCRIPTION OF PROCEDURE: Patient was brought to the operating room and anesthesia induced by the anesthesia team. Patient was positioned, padded. Left lower quadrant was prepped with chloraprep scrub. Timeout taken.   Area of planned incision was infiltrated with marcaine. Prior incision was reopened and the old pain pump was externalized. Catheter was disconnected. There was spontaneous flow of CSF. The catheter was cleared with removal of over 2cc of CSF/fluid. The new IT pump was filled on the back table with a new preparation of identical previous admixture of preservative free fentanyl 1000 mcg/cc and bupivicaine 20 mg/mL, and connected to the catheter.  Catheter was connected to the pump with an 0 silk tie.   0 silk sutures were placed in the pocket. The new pump was then internalized, and the 0 silk sutures used to fix the new pump in place with its side port at the 3 o'clock position (same orientation as previous). Wound was irrigated , and a antibiotic eluting patch placed in the pocket. Incision was closed with 2-0 Vicryl and 3-0 monocryl and dermabond and dressed with a sterile occlusive dressing. Pump was programmed to run a simple continuous infusion of fentanyl at 341 mcg/day, with bupivicaine a nearly 20% reduction compared to her last pump settings. Catheter priming was done as well.   COMPLICATIONS: none CONDITION: stable during procedure, and to PACU DISPOSITION: home with additional pain medication. F/u 2 weeks for wound check. New reservoir alarm date is 08/20/2019. Plan post op follow up and return to Dr. Patricia Nettle care for future refills.

## 2019-05-09 NOTE — H&P (Signed)
Kendra Nelson is an 58 y.o. female.   Chief Complaint: "my pump battery is dying" HPI: 58 year old right-handed woman, referred by Dr. Delano Metz  for replacement of intrathecal pump (not including catheter), with the battery for the device at the end of its service life.  Patient has a long history of lumbar and orthopedic surgeries, most of which took place when she was a resident in [place].  She has residual traumatic and lumbar post-laminectomy syndrome, which is treated with oral medications, intrathecal pump therapy, and spinal cord stimulator.  She last had her intrathecal pump replaced in 2013, also having achieved the end of its service life at that point.  Patient reports that that surgery was uncomplicated.  She has had no trouble with drug delivery or refills.  She does note that the pump has been beeping intermittently for the last 2 months or so.  Her last refill was in June.  Interrogation of her pump on 05/06/2019 reveals that she has a simple continuous infusion of fentanyl and bupivacaine.  Interrogation also reveals that the battery is at the end of its service life with its expected date of failure somewhere between 9/8 and 05/11/2019.   Past Medical History:  Diagnosis Date  . Anxiety    off of valium- stress  . Arthritis    left knee- rhumatoid- hasnt seen specialist   . Chronic lower back pain   . Complication of anesthesia    WOKE DURING BACK SURGERY ; urinary retention 12/20/2017  . Hyperlipidemia   . Hypertension   . Migraine    "when seasons change" (12/20/2017)  . Neuromuscular disorder (HCC)   . Restless leg syndrome   . Seasonal allergies   . Skin abnormalities   . Wears glasses     Past Surgical History:  Procedure Laterality Date  . BACK SURGERY     8-9 SURGERIES; lower back  . COLONOSCOPY    . JOINT REPLACEMENT    . PAIN PUMP IMPLANTATION  05/10/2012   Procedure: PAIN PUMP INSERTION;  Surgeon: Maeola Harman, MD;  Location: [facility];   Service: Neurosurgery;  Laterality: N/A;  Pump replacement  . SPINAL CORD STIMULATOR BATTERY EXCHANGE N/A 06/09/2016   Procedure: IMPLANTABLE PULSE GENERATOR REPLACEMENT WITH RECHARGEABLE BATTERY;  Surgeon: Maeola Harman, MD;  Location: [facility];  Service: Neurosurgery;  Laterality: N/A;  . TOTAL KNEE ARTHROPLASTY Left 12/20/2017  . TOTAL KNEE ARTHROPLASTY Left 12/20/2017   Procedure: TOTAL KNEE ARTHROPLASTY;  Surgeon: Gean Birchwood, MD;  Location: [facility] OR;  Service: Orthopedics;  Laterality: Left;  Marland Kitchen VAGINAL HYSTERECTOMY      Family History  Problem Relation Age of Onset  . Aortic aneurysm Mother   . Diabetes Maternal Grandmother    Social History:  reports that she quit smoking about 5 months ago. Her smoking use included cigarettes. She has a 30.00 pack-year smoking history. She has never used smokeless tobacco. She reports current drug use. Drug: Oxycodone. She reports that she does not drink alcohol.  Allergies:  Allergies  Allergen Reactions  . Benadryl [Diphenhydramine Hcl] Hives and Rash  . Bee Venom Other (See Comments)    UNSPECIFIED REACTION     Medications Prior to Admission  Medication Sig Dispense Refill  . atorvastatin (LIPITOR) 20 MG tablet Take 20 mg by mouth every morning.     . cyclobenzaprine (FLEXERIL) 10 MG tablet Take 10 mg by mouth at bedtime.     . hydrOXYzine (ATARAX/VISTARIL) 25 MG tablet Take 25 mg by  mouth 2 (two) times daily.    Marland Kitchen lisinopril (PRINIVIL,ZESTRIL) 20 MG tablet Take 40 mg by mouth every morning.     . Oxycodone HCl 10 MG TABS Take 1 tablet (10 mg total) by mouth every 6 (six) hours as needed. Max: 4/day. Must last 30 days. 120 tablet 0  . [START ON 05/25/2019] Oxycodone HCl 10 MG TABS Take 1 tablet (10 mg total) by mouth every 6 (six) hours as needed. Max: 4/day. Must last 30 days. 120 tablet 0  . rOPINIRole (REQUIP) 0.5 MG tablet Take 1 mg by mouth at bedtime. Restless legs      No results found for this or any previous visit (from the past 48  hour(s)). No results found.  Review of Systems  Constitutional: Negative.   HENT: Negative.   Eyes: Negative.   Respiratory: Negative.   Cardiovascular: Negative.   Gastrointestinal: Negative.   Genitourinary: Negative.   Musculoskeletal: Negative.   Skin: Negative.   Endo/Heme/Allergies: Negative.   Psychiatric/Behavioral: Negative.     Blood pressure (!) 168/82, pulse 87, temperature 97.7 F (36.5 C), temperature source Oral, resp. rate 20, height 5\' 3"  (1.6 m), weight 81.6 kg, SpO2 100 %. Physical Exam  Constitutional: She is oriented to person, place, and time. She appears well-developed and well-nourished.  HENT:  Head: Normocephalic and atraumatic.  Right Ear: External ear normal.  Left Ear: External ear normal.  Eyes: Pupils are equal, round, and reactive to light. EOM are normal.  Neck: Normal range of motion.  Cardiovascular: Normal rate and regular rhythm.  Respiratory: Effort normal.  GI: Soft.  Musculoskeletal: Normal range of motion.  Neurological: She is alert and oriented to person, place, and time.  Skin: Skin is warm and dry.  Psychiatric: She has a normal mood and affect. Thought content normal.     Assessment/Plan Chronic pain sydrome IT pump implant with internal battery at end of service life  PLAN: replacement of [place]ill, MD 05/09/2019, 12:46 PM

## 2019-05-09 NOTE — Discharge Instructions (Addendum)
Dr. Harkins Post-Op Orders ° °• Ice Pack - 20 minutes on (in a pillow case), and 20 minutes off. Wear the ice pack UNDER the binder. °• Follow up in office, they will call you for an appointment in 10 days to 2 weeks. °• Increase activity gradually.   °• No lifting anything heavier than a gallon of milk (10 pounds) until seen in the office. °• Advance diet slowly as tolerated. °• Dressing care:  Keep dressing dry for 3 days, and on Post-op day 4, may remove dressing and shower. °• Call for fever, drainage, and redness. °• No swimming or bathing in a bathtub (do not get into standing water). ° °

## 2019-05-09 NOTE — Anesthesia Postprocedure Evaluation (Signed)
Anesthesia Post Note  Patient: Kendra Nelson  Procedure(s) Performed: Intrathecal pump change (N/A )     Patient location during evaluation: PACU Anesthesia Type: General Level of consciousness: awake and alert Pain management: pain level controlled Vital Signs Assessment: post-procedure vital signs reviewed and stable Respiratory status: spontaneous breathing, nonlabored ventilation and respiratory function stable Cardiovascular status: blood pressure returned to baseline and stable Postop Assessment: no apparent nausea or vomiting Anesthetic complications: no    Last Vitals:  Vitals:   05/09/19 1442 05/09/19 1508  BP: 137/63 (!) 158/76  Pulse: 88 89  Resp: 16 20  Temp:  36.7 C  SpO2: 94% 100%    Last Pain:  Vitals:   05/09/19 1508  TempSrc:   PainSc: 0-No pain                 Quintessa Simmerman,W. EDMOND

## 2019-05-09 NOTE — Progress Notes (Signed)
Per Dr. Ola Spurr, no EKG needed.

## 2019-05-09 NOTE — Transfer of Care (Signed)
Immediate Anesthesia Transfer of Care Note  Patient: Kendra Nelson  Procedure(s) Performed: Intrathecal pump change (N/A )  Patient Location: PACU  Anesthesia Type:General  Level of Consciousness: awake, oriented and patient cooperative  Airway & Oxygen Therapy: Patient Spontanous Breathing and Patient connected to face mask oxygen  Post-op Assessment: Report given to RN and Post -op Vital signs reviewed and stable  Post vital signs: Reviewed and stable  Last Vitals:  Vitals Value Taken Time  BP 149/79 05/09/19 1411  Temp    Pulse 94 05/09/19 1412  Resp 17 05/09/19 1412  SpO2 100 % 05/09/19 1412  Vitals shown include unvalidated device data.  Last Pain:  Vitals:   05/09/19 1119  TempSrc:   PainSc: 3       Patients Stated Pain Goal: 3 (17/79/39 0300)  Complications: No apparent anesthesia complications

## 2019-05-09 NOTE — Anesthesia Preprocedure Evaluation (Addendum)
Anesthesia Evaluation  Patient identified by MRN, date of birth, ID band Patient awake    Reviewed: Allergy & Precautions, H&P , NPO status , Patient's Chart, lab work & pertinent test results  Airway Mallampati: II  TM Distance: >3 FB Neck ROM: Full    Dental no notable dental hx. (+) Teeth Intact, Dental Advisory Given   Pulmonary neg pulmonary ROS, Patient abstained from smoking., former smoker,    Pulmonary exam normal breath sounds clear to auscultation       Cardiovascular hypertension, Pt. on medications  Rhythm:Regular Rate:Normal     Neuro/Psych  Headaches, Anxiety    GI/Hepatic negative GI ROS, Neg liver ROS,   Endo/Other  negative endocrine ROS  Renal/GU negative Renal ROS  negative genitourinary   Musculoskeletal  (+) Arthritis , Osteoarthritis,    Abdominal   Peds  Hematology negative hematology ROS (+)   Anesthesia Other Findings   Reproductive/Obstetrics negative OB ROS                            Anesthesia Physical Anesthesia Plan  ASA: II  Anesthesia Plan: General   Post-op Pain Management:    Induction: Intravenous  PONV Risk Score and Plan: 4 or greater and Ondansetron, Dexamethasone and Midazolam  Airway Management Planned: Oral ETT  Additional Equipment:   Intra-op Plan:   Post-operative Plan: Extubation in OR  Informed Consent: I have reviewed the patients History and Physical, chart, labs and discussed the procedure including the risks, benefits and alternatives for the proposed anesthesia with the patient or authorized representative who has indicated his/her understanding and acceptance.     Dental advisory given  Plan Discussed with: CRNA  Anesthesia Plan Comments:         Anesthesia Quick Evaluation

## 2019-05-09 NOTE — Anesthesia Procedure Notes (Signed)
Procedure Name: LMA Insertion Date/Time: 05/09/2019 1:04 PM Performed by: Orlie Dakin, CRNA Pre-anesthesia Checklist: Patient identified, Emergency Drugs available, Suction available and Patient being monitored Patient Re-evaluated:Patient Re-evaluated prior to induction Oxygen Delivery Method: Circle system utilized Preoxygenation: Pre-oxygenation with 100% oxygen Induction Type: IV induction LMA: LMA inserted LMA Size: 4.0 Tube type: Oral Number of attempts: 1 Placement Confirmation: positive ETCO2 Tube secured with: Tape Dental Injury: Teeth and Oropharynx as per pre-operative assessment

## 2019-05-10 ENCOUNTER — Encounter (HOSPITAL_COMMUNITY): Payer: Self-pay | Admitting: Anesthesiology

## 2019-06-13 ENCOUNTER — Telehealth: Payer: Self-pay

## 2019-06-13 NOTE — Telephone Encounter (Signed)
Instructed patient to come by office and get her pump interrogated so that we may order her pump mediciae and get a pump refill appointment.

## 2019-06-22 ENCOUNTER — Other Ambulatory Visit: Payer: Self-pay

## 2019-06-22 ENCOUNTER — Ambulatory Visit: Payer: Medicare HMO | Attending: Pain Medicine | Admitting: Pain Medicine

## 2019-06-22 DIAGNOSIS — M961 Postlaminectomy syndrome, not elsewhere classified: Secondary | ICD-10-CM

## 2019-06-22 DIAGNOSIS — M5442 Lumbago with sciatica, left side: Secondary | ICD-10-CM

## 2019-06-22 DIAGNOSIS — G894 Chronic pain syndrome: Secondary | ICD-10-CM

## 2019-06-22 DIAGNOSIS — M5441 Lumbago with sciatica, right side: Secondary | ICD-10-CM

## 2019-06-22 DIAGNOSIS — M792 Neuralgia and neuritis, unspecified: Secondary | ICD-10-CM

## 2019-06-22 DIAGNOSIS — M79604 Pain in right leg: Secondary | ICD-10-CM

## 2019-06-22 DIAGNOSIS — M79605 Pain in left leg: Secondary | ICD-10-CM

## 2019-06-22 DIAGNOSIS — G8929 Other chronic pain: Secondary | ICD-10-CM

## 2019-06-22 MED ORDER — OXYCODONE HCL 10 MG PO TABS: 10.0000 mg | ORAL_TABLET | Freq: Four times a day (QID) | ORAL | 0 refills | Status: DC | PRN

## 2019-06-22 MED ORDER — PREGABALIN 25 MG PO CAPS: ORAL_CAPSULE | ORAL | 2 refills | Status: DC

## 2019-06-22 NOTE — Progress Notes (Signed)
Pain Management Virtual Encounter Note - Virtual Visit via Telephone Telehealth (real-time audio visits between healthcare provider and patient).   Patient's Phone No. & Preferred Pharmacy:  [phone] (home); There is no such number on file (mobile).; (Preferred) [phone] [email or web address]  [id number] [place], [place] - [address] AT [facility] OF [address] [place] [address] Phone: [phone] Fax: [phone]    Pre-screening note:  Our staff contacted Kendra Nelson and offered her an "in person", "face-to-face" appointment versus a telephone encounter. She indicated preferring the telephone encounter, at this time.   Reason for Virtual Visit: COVID-19*  Social distancing based on CDC and AMA recommendations.   I contacted Kendra Nelson on 06/22/2019 via telephone.      I clearly identified myself as Oswaldo Done, MD. I verified that I was speaking with the correct person using two identifiers (Name: ILEE Nelson, and date of birth: 1960/12/20).  Advanced Informed Consent I sought verbal advanced consent from Kendra Nelson for virtual visit interactions. I informed Kendra Nelson of possible security and privacy concerns, risks, and limitations associated with providing "not-in-person" medical evaluation and management services. I also informed Kendra Nelson of the availability of "in-person" appointments. Finally, I informed her that there would be a charge for the virtual visit and that she could be  personally, fully or partially, financially responsible for it. Kendra Nelson expressed understanding and agreed to proceed.   Historic Elements   Kendra Nelson is a 58 y.o. year old, female patient evaluated today after her last encounter by our practice on 06/13/2019. Kendra Nelson  has a past medical history of Anxiety, Arthritis, Chronic lower back pain, Complication of anesthesia,  Hyperlipidemia, Hypertension, Migraine, Neuromuscular disorder (HCC), Restless leg syndrome, Seasonal allergies, Skin abnormalities, and Wears glasses. She also  has a past surgical history that includes Pain pump implantation (05/10/2012); Colonoscopy; Spinal cord stimulator battery exchange (N/A, 06/09/2016); Total knee arthroplasty (Left, 12/20/2017); Joint replacement; Back surgery; Vaginal hysterectomy; Total knee arthroplasty (Left, 12/20/2017); and Intrathecal pump revision (N/A, 05/09/2019). Kendra Nelson has a current medication list which includes the following prescription(s): atorvastatin, cyclobenzaprine, hydroxyzine, lisinopril, oxycodone hcl, oxycodone hcl, oxycodone hcl, pregabalin, and ropinirole. She  reports that she quit smoking about 7 months ago. Her smoking use included cigarettes. She has a 30.00 pack-year smoking history. She has never used smokeless tobacco. She reports current drug use. Drug: Oxycodone. She reports that she does not drink alcohol. Kendra Nelson is allergic to benadryl [diphenhydramine hcl] and bee venom.   HPI  Today, she is being contacted for medication management.  Pharmacotherapy Assessment  Analgesic: Oxycodone IR 10mg  4x/day(40mg /dayof oxycodone) (60MME/day) + 357 mcg/day of intrathecal PF-Fentanyl (33 MME/day)(enough oral to last until 06/24/2019) MME/day: 60MME/day (oral) + 33 MME/day (intrathecal) = 93 MME/day (Aprox. 1.16 MME/day/kg).   Monitoring: Pharmacotherapy: No side-effects or adverse reactions reported.  PMP: PDMP reviewed during this encounter.       Compliance: No problems identified. Effectiveness: Clinically acceptable. Plan: Refer to "POC".  UDS:  Summary  Date Value Ref Range Status  03/23/2019 Note  Final    Comment:    ==================================================================== [employer] Select 13 (MW) ==================================================================== Test                             Result        Flag       Units Drug Present and  Declared for Prescription Verification   Oxycodone                      4297         EXPECTED   ng/mg creat   Oxymorphone                    1874         EXPECTED   ng/mg creat   Noroxycodone                   3680         EXPECTED   ng/mg creat   Noroxymorphone                 467          EXPECTED   ng/mg creat    Sources of oxycodone are scheduled prescription medications.    Oxymorphone, noroxycodone, and noroxymorphone are expected    metabolites of oxycodone. Oxymorphone is also available as a    scheduled prescription medication. Drug Present not Declared for Prescription Verification   Fentanyl                       38           UNEXPECTED ng/mg creat   Norfentanyl                    57           UNEXPECTED ng/mg creat    Source of fentanyl is a scheduled prescription medication, including    IV, patch, and transmucosal formulations. Norfentanyl is an expected    metabolite of fentanyl. ==================================================================== Test                      Result    Flag   Units      Ref Range   Creatinine              107              mg/dL      >=20 ==================================================================== Declared Medications:  The flagging and interpretation on this report are based on the  following declared medications.  Unexpected results may arise from  inaccuracies in the declared medications.  **Note: The testing scope of this panel includes these medications:  Oxycodone  **Note: The testing scope of this panel does not include the  following reported medications:  Acitretin  Atorvastatin  Cyclobenzaprine  Lisinopril  Meloxicam  Metoprolol  Multivitamin  Ropinirole (Requip) ==================================================================== For clinical consultation, please call [phone]. ====================================================================    Laboratory Chemistry  Profile (12 mo)  Renal: 03/23/2019: BUN 16; BUN/Creatinine Ratio 17; Creatinine, Ser 0.96  Lab Results  Component Value Date   GFRAA 76 03/23/2019   GFRNONAA 66 03/23/2019   Hepatic: 03/23/2019: Albumin 4.3 Lab Results  Component Value Date   AST 37 03/23/2019   ALT 52 (H) 10/05/2007   Other: 03/23/2019: 25-Hydroxy, Vitamin D 18; 25-Hydroxy, Vitamin D-2 <1.0; 25-Hydroxy, Vitamin D-3 18; CRP 4; Sed Rate 34; Vitamin B-12 514 Note: Above Lab results reviewed.  Imaging  Last 90 days:  No results found.  Assessment  The primary encounter diagnosis was Chronic pain syndrome. 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  Plan of Care  I am having Kendra Nelson start on Oxycodone HCl, Oxycodone HCl, and pregabalin. I am also having her maintain her lisinopril, cyclobenzaprine, atorvastatin, rOPINIRole, hydrOXYzine, and Oxycodone HCl.  Pharmacotherapy (Medications Ordered): Meds ordered this encounter  Medications  . Oxycodone HCl 10 MG TABS    Sig: Take 1 tablet (10 mg total) by mouth every 6 (six) hours as needed. Max: 4/day. Must last 30 days.    Dispense:  120 tablet    Refill:  0    Chronic Pain: STOP Act (Not applicable) Fill 1 day early if closed on refill date. Do not fill until: 06/25/2019. To last until: 07/25/2019. Avoid benzodiazepines within 8 hours of opioids  . Oxycodone HCl 10 MG TABS    Sig: Take 1 tablet (10 mg total) by mouth every 6 (six) hours as needed. Max: 4/day. Must last 30 days.    Dispense:  120 tablet    Refill:  0    Chronic Pain: STOP Act (Not applicable) Fill 1 day early if closed on refill date. Do not fill until: 07/25/2019. To last until: 08/24/2019. Avoid benzodiazepines within 8 hours of opioids  .  Oxycodone HCl 10 MG TABS    Sig: Take 1 tablet (10 mg total) by mouth every 6 (six) hours as needed. Max: 4/day. Must last 30 days.    Dispense:  120 tablet    Refill:  0    Chronic Pain: STOP Act (Not applicable) Fill 1 day early if closed on refill date. Do not fill until: 08/24/2019. To last until: 09/23/2019. Avoid benzodiazepines within 8 hours of opioids  . pregabalin (LYRICA) 25 MG capsule    Sig: Take 1 capsule (25 mg total) by mouth at bedtime for 14 days, THEN 1 capsule (25 mg total) 2 (two) times daily for 14 days, THEN 1 capsule (25 mg total) 3 (three) times daily.    Dispense:  76 capsule    Refill:  2    Fill one day early if pharmacy is closed on scheduled refill date. May substitute for generic if available.   Orders:  No orders of the defined types were placed in this encounter.  Follow-up plan:   Return in about 3 months (around 09/20/2019) for (VV), (MM) to evaluate the Lyrica titration, in addition, Pump Refill (Max:30104mo).      Interventional management options: Planned, scheduled, and/or pending:   Intrathecal pump refill as per pump programming.   Considering:   Diagnostic left intra-articular knee joint aspiration and injection of local anesthetic and steroid  Diagnostic bilateral L2-3 transforaminal epidural steroid injection  Diagnostic left genicular nerve block  Possible left genicular nerve RFA  Palliative/therapeutic intrathecal pump analysis and refill Diagnostic caudal epidural steroid injection + diagnostic epidurogram Possible Racz procedure Diagnostic bilateral lumbar facet block Possible bilateral lumbar facet RFA (unlikely due to extensive lumbar fusion from L3-S1)    Palliative PRN treatment(s):   None at this time    Recent Visits No visits were found meeting these conditions.  Showing recent visits within past 90 days and meeting all other requirements   Today's Visits Date Type Provider Dept  06/22/19 Office Visit Delano MetzNaveira, Makita Blow,  MD [facility]  Showing today's visits and meeting all other requirements   Future Appointments Date Type Provider Dept  08/17/19 Appointment Delano MetzNaveira, Renatha Rosen, MD [facility]  Showing future appointments within next 90 days and meeting all other requirements  I discussed the assessment and treatment plan with the patient. The patient was provided an opportunity to ask questions and all were answered. The patient agreed with the plan and demonstrated an understanding of the instructions.  Patient advised to call back or seek an in-person evaluation if the symptoms or condition worsens.  Total duration of non-face-to-face encounter: 15 minutes.  Note by: Oswaldo DoneFrancisco A Janah Mcculloh, MD Date: 06/22/2019; Time: 12:13 PM  Note: This dictation was prepared with Dragon dictation. Any transcriptional errors that may result from this process are unintentional.  Disclaimer:  * Given the special circumstances of the COVID-19 pandemic, the federal government has announced that the Office for Civil Rights (OCR) will exercise its enforcement discretion and will not impose penalties on physicians using telehealth in the event of noncompliance with regulatory requirements under the [employer] Insurance Portability and Accountability Act (HIPAA) in connection with the good faith provision of telehealth during the COVID-19 national public health emergency. (AMA)

## 2019-07-24 ENCOUNTER — Other Ambulatory Visit: Payer: Self-pay

## 2019-07-24 MED ORDER — PAIN MANAGEMENT IT PUMP REFILL: 1.0000 | Freq: Once | INTRATHECAL | 0 refills | Status: AC

## 2019-07-25 ENCOUNTER — Telehealth: Payer: Self-pay

## 2019-07-25 NOTE — Telephone Encounter (Signed)
Her medication that was prescribed to her is over 400 and she cant afford it until after the first of the year. She said it was for the nerves in her leg. She wanted to let him know that she cant start taking it until her insurance kicks in Jan. 1. She doesn't know the name of the medicine.

## 2019-07-26 ENCOUNTER — Telehealth: Payer: Self-pay

## 2019-07-26 NOTE — Telephone Encounter (Signed)
Called patient to inform her that another was sent to insurance company to see if they will approve Lyrica. Instructed patient to call Friday or Sunday to see if it was covered.  Pt with understanding.

## 2019-07-26 NOTE — Telephone Encounter (Signed)
Called and talked to patient . She states it is for Lyrica and she can not afford it until Jan when insurance can pay for this. This is a new medication to treat nerve pain . She wanted Dr Lowella Dandy to know.

## 2019-08-17 ENCOUNTER — Encounter: Payer: Self-pay | Admitting: Pain Medicine

## 2019-08-17 ENCOUNTER — Ambulatory Visit: Payer: Medicare HMO | Attending: Pain Medicine | Admitting: Pain Medicine

## 2019-08-17 ENCOUNTER — Other Ambulatory Visit: Payer: Self-pay

## 2019-08-17 VITALS — BP 219/112 | HR 72 | Temp 97.4°F | Ht 63.0 in | Wt 185.0 lb

## 2019-08-17 DIAGNOSIS — M79605 Pain in left leg: Secondary | ICD-10-CM | POA: Diagnosis not present

## 2019-08-17 DIAGNOSIS — Z451 Encounter for adjustment and management of infusion pump: Secondary | ICD-10-CM | POA: Diagnosis not present

## 2019-08-17 DIAGNOSIS — M5441 Lumbago with sciatica, right side: Secondary | ICD-10-CM | POA: Diagnosis present

## 2019-08-17 DIAGNOSIS — Z978 Presence of other specified devices: Secondary | ICD-10-CM | POA: Diagnosis not present

## 2019-08-17 DIAGNOSIS — G8929 Other chronic pain: Secondary | ICD-10-CM

## 2019-08-17 DIAGNOSIS — M79604 Pain in right leg: Secondary | ICD-10-CM | POA: Diagnosis not present

## 2019-08-17 DIAGNOSIS — M5442 Lumbago with sciatica, left side: Secondary | ICD-10-CM | POA: Insufficient documentation

## 2019-08-17 DIAGNOSIS — M792 Neuralgia and neuritis, unspecified: Secondary | ICD-10-CM

## 2019-08-17 DIAGNOSIS — G894 Chronic pain syndrome: Secondary | ICD-10-CM | POA: Diagnosis not present

## 2019-08-17 DIAGNOSIS — M961 Postlaminectomy syndrome, not elsewhere classified: Secondary | ICD-10-CM | POA: Insufficient documentation

## 2019-08-17 DIAGNOSIS — M5417 Radiculopathy, lumbosacral region: Secondary | ICD-10-CM | POA: Insufficient documentation

## 2019-08-17 MED ORDER — OXYCODONE HCL 10 MG PO TABS: 10.0000 mg | ORAL_TABLET | Freq: Four times a day (QID) | ORAL | 0 refills | Status: DC | PRN

## 2019-08-17 MED ORDER — PREGABALIN 75 MG PO CAPS: ORAL_CAPSULE | ORAL | 0 refills | Status: DC

## 2019-08-17 MED ORDER — PREGABALIN 50 MG PO CAPS: ORAL_CAPSULE | ORAL | 0 refills | Status: DC

## 2019-08-17 NOTE — Patient Instructions (Signed)
Opioid Overdose Opioids are drugs that are often used to treat pain. Opioids include illegal drugs, such as heroin, as well as prescription pain medicines, such as codeine, morphine, hydrocodone, oxycodone, and fentanyl. An opioid overdose happens when you take too much of an opioid. An overdose may be intentional or accidental and can happen with any type of opioid. The effects of an overdose can be mild, dangerous, or even deadly. Opioid overdose is a medical emergency. What are the causes? This condition may be caused by:  Taking too much of an opioid on purpose.  Taking too much of an opioid by accident.  Using two or more substances that contain opioids at the same time.  Taking an opioid with a substance that affects your heart, breathing, or blood pressure. These include alcohol, tranquilizers, sleeping pills, illegal drugs, and some over-the-counter medicines. This condition may also happen due to an error made by:  A health care provider who prescribes a medicine.  The pharmacist who fills the prescription order. What increases the risk? This condition is more likely in:  Children. They may be attracted to colorful pills. Because of a child's small size, even a small amount of a drug can be dangerous.  Older people. They may be taking many different drugs. Older people may have difficulty reading labels or remembering when they last took their medicine. They may also be more sensitive to the effects of opioids.  People with chronic medical conditions, especially heart, liver, kidney, or neurological diseases.  People who take an opioid for a long period of time.  People who use: ? Illegal drugs. IV heroin is especially dangerous. ? Other substances, including alcohol, while using an opioid.  People who have: ? A history of drug or alcohol abuse. ? Certain mental health conditions. ? A history of previous drug overdoses.  People who take opioids that are not prescribed  for them. What are the signs or symptoms? Symptoms of this condition depend on the type of opioid and the amount that was taken. Common symptoms include:  Sleepiness or difficulty waking from sleep.  Decrease in attention.  Confusion.  Slurred speech.  Slowed breathing and a slow pulse (bradycardia).  Nausea and vomiting.  Abnormally small pupils. Signs and symptoms that require emergency treatment include:  Cold, clammy, and pale skin.  Blue lips and fingernails.  Vomiting.  Gurgling sounds in the throat.  A pulse that is very slow or difficult to detect.  Breathing that is very irregular, slow, noisy, or difficult to detect.  Limp body.  Inability to respond to speech or be awakened from sleep (stupor).  Seizures. How is this diagnosed? This condition is diagnosed based on your symptoms and medical history. It is important to tell your health care provider:  About all of the opioids that you took.  When you took the opioids.  Whether you were drinking alcohol or using marijuana, cocaine, or other drugs. Your health care provider will do a physical exam. This exam may include:  Checking and monitoring your heart rate and rhythm, breathing rate, temperature, and blood pressure (vital signs).  Measuring oxygen levels in your blood.  Checking for abnormally small pupils. You may also have blood tests or urine tests. You may have X-rays if you are having severe breathing problems. How is this treated? This condition requires immediate medical treatment and hospitalization. Treatment is given in the hospital intensive care (ICU) setting. Supporting your vital signs and your breathing is the first step in   treating an opioid overdose. Treatment may also include:  Giving salts and minerals (electrolytes) along with fluids through an IV.  Inserting a breathing tube (endotracheal tube) in your airway to help you breathe if you cannot breathe on your own or you are in  danger of not being able to breathe on your own.  Giving oxygen through a small tube under your nose.  Passing a tube through your nose and into your stomach (nasogastric tube, or NG tube) to empty your stomach.  Giving medicines that: ? Increase your blood pressure. ? Relieve nausea and vomiting. ? Relieve abdominal pain and cramping. ? Reverse the effects of the opioid (naloxone).  Monitoring your heart and oxygen levels.  Ongoing counseling and mental health support if you intentionally overdosed or used an illegal drug. Follow these instructions at home:  Medicines  Take over-the-counter and prescription medicines only as told by your health care provider.  Always ask your health care provider about possible side effects and interactions of any new medicine that you start taking.  Keep a list of all the medicines that you take, including over-the-counter medicines. Bring this list with you to all your medical visits. General instructions  Drink enough fluid to keep your urine pale yellow.  Keep all follow-up visits as told by your health care provider. This is important. How is this prevented?  Read the drug inserts that come with your opioid pain medicines.  Take medicines only as told by your health care provider. Do not take more medicine than you are told. Do not take medicines more frequently than you are told.  Do not drink alcohol or take sedatives when taking opioids.  Do not use illegal or recreational drugs, including cocaine, ecstasy, and marijuana.  Do not take opioid medicines that are not prescribed for you.  Store all medicines in safety containers that are out of the reach of children.  Get help if you are struggling with: ? Alcohol or drug use. ? Depression or another mental health problem. ? Thoughts of hurting yourself or another person.  Keep the phone number of your local poison control center near your phone or in your mobile phone. In the  [place] the hotline of the National Poison Control Center is (800) 222-1222.  If you were prescribed naloxone, make sure you understand how to take it. Contact a health care provider if you:  Need help understanding how to take your pain medicines.  Feel your medicines are too strong.  Are concerned that your pain medicines are not working well for your pain.  Develop new symptoms or side effects when you are taking medicines. Get help right away if:  You or someone else is having symptoms of an opioid overdose. Get help even if you are not sure.  You have serious thoughts about hurting yourself or others.  You have: ? Chest pain. ? Difficulty breathing. ? A loss of consciousness. These symptoms may represent a serious problem that is an emergency. Do not wait to see if the symptoms will go away. Get medical help right away. Call your local emergency services (911 in the [place] Do not drive yourself to the hospital. If you ever feel like you may hurt yourself or others, or have thoughts about taking your own life, get help right away. You can go to your nearest emergency department or call:  Your local emergency services (911 in the [place]  A suicide crisis helpline, such as the National Suicide Prevention Lifeline at   1-800-273-8255. This is open 24 hours a day. Summary  Opioids are drugs that are often used to treat pain. Opioids include illegal drugs, such as heroin, as well as prescription pain medicines.  An opioid overdose happens when you take too much of an opioid.  Overdoses can be intentional or accidental.  Opioid overdose is very dangerous. It is a life-threatening emergency.  If you or someone you know is experiencing an opioid overdose, get help right away. This information is not intended to replace advice given to you by your health care provider. Make sure you discuss any questions you have with your health care provider. Document Released: 09/24/2004 Document  Revised: 08/04/2018 Document Reviewed: 08/04/2018 [employer] Patient Education  2020 [employer]  

## 2019-08-17 NOTE — Progress Notes (Signed)
Disclaimer: In compliance with Federal Rules mandating open notes, implemented by the [facility] [facility], these notes are made available to patients through the electronic medical record. Information contained herein reflects the providers review of information obtained during the pre-charting review of records, as well as notes taken during the patients appointment.  Although all data contained herein was reviewed by the provider, unless specifically stated, due to time constrains, not all of it was discussed with the patient during the appointment. Specific medical language and abbreviations used within medical records is meant to communicate precise data to individuals trained to interpret such data.  Warning: These encounter notes are not meant to serve the purpose of providing patients with clear and concise information on their conditions, treatment plan, or specific risks and possible complications. Such information is provided to patients under the encounter's "Patient Information" section. Interpretation of the information contained herein should be left to individuals with the necessary training to understand the data.  Patient's Name: Kendra Nelson  MRN: [id number]  Referring Provider: No ref. provider found  DOB: 1961-07-11  PCP: System, Pcp Not In  DOS: 08/17/2019  Note by: Gaspar Cola, MD  Service setting: Ambulatory outpatient  Specialty: Interventional Pain Management  Patient type: Established  Location: [facility] (AMB) Pain Management Facility  Visit type: Interventional Procedure   Primary Reason for Visit: Interventional Pain Management Treatment. CC: Back Pain  Procedure:          Intrathecal Drug Delivery System (IDDS):  Type: Reservoir Refill [phone]) 10% rate increase Region: Abdominal Laterality: Left  Type of Pump: Medtronic Synchromed II Delivery Route: Intrathecal Type of Pain Treated: Neuropathic/Nociceptive Primary Medication Class: Opioid/opiate  Medication,  Concentration, Infusion Program, & Delivery Rate: Please see scanned programming printout.   Indications: 1. Chronic pain syndrome   2. Chronic low back pain (Primary Area of Pain) (Bilateral) (R>L)   3. 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 Chronic lumbosacral radiculopathy (L5) (Left)   6. Presence of intrathecal pump   7. Adjustment and management of infusion pump   8. Neurogenic pain   9. Neuropathic pain    Pain Assessment: Self-Reported Pain Score: 6 /10             Reported level is compatible with observation.        Pharmacotherapy Assessment  Analgesic: Oxycodone IR 81m 4x/day(412mdayof oxycodone) (60MME/day) +  PF-(intrathecal)-Fentanyl (13.4 mcg/hr) MME/day: 60MME/day (oral) + 32.16 MME/day (intrathecal) = 93.16 MME/day (Aprox. 1.16 MME/day/kg).   Monitoring: Pharmacotherapy: No side-effects or adverse reactions reported. [place] PMP: PDMP reviewed during this encounter.       Compliance: No problems identified. Effectiveness: Clinically acceptable. Plan: Refer to "POC".  UDS:  Summary  Date Value Ref Range Status  03/23/2019 Note  Final    Comment:    ==================================================================== [employer] Select 13 (MW) ==================================================================== Test                             Result       Flag       Units Drug Present and Declared for Prescription Verification   Oxycodone                      4297         EXPECTED   ng/mg creat   Oxymorphone  1874         EXPECTED   ng/mg creat   Noroxycodone                   3680         EXPECTED   ng/mg creat   Noroxymorphone                 467          EXPECTED   ng/mg creat    Sources of oxycodone are scheduled prescription medications.    Oxymorphone, noroxycodone, and noroxymorphone are expected    metabolites of  oxycodone. Oxymorphone is also available as a    scheduled prescription medication. Drug Present not Declared for Prescription Verification   Fentanyl                       38           UNEXPECTED ng/mg creat   Norfentanyl                    57           UNEXPECTED ng/mg creat    Source of fentanyl is a scheduled prescription medication, including    IV, patch, and transmucosal formulations. Norfentanyl is an expected    metabolite of fentanyl. ==================================================================== Test                      Result    Flag   Units      Ref Range   Creatinine              107              mg/dL      >=20 ==================================================================== Declared Medications:  The flagging and interpretation on this report are based on the  following declared medications.  Unexpected results may arise from  inaccuracies in the declared medications.  **Note: The testing scope of this panel includes these medications:  Oxycodone  **Note: The testing scope of this panel does not include the  following reported medications:  Acitretin  Atorvastatin  Cyclobenzaprine  Lisinopril  Meloxicam  Metoprolol  Multivitamin  Ropinirole (Requip) ==================================================================== For clinical consultation, please call [phone]. ====================================================================    Intrathecal Pump Therapy Assessment  Manufacturer: Medtronic Synchromed Type: Programmable Volume: 40 mL reservoir MRI compatibility: Yes   Drug content:  Primary Medication Class: Opioid Primary Medication: PF-Fentanyl (1,000 mcg/mL)  Secondary Medication: PF-Bupivacaine (20.0 mg/mL)  Other Medication: None   Programming:  Type: Simple continuous. See pump readout for details.   Changes:  Medication Change: None at this point Rate Change: No change in rate  Reported side-effects or adverse reactions:  None reported  Effectiveness: Described as relatively effective, allowing for increase in activities of daily living (ADL) Clinically meaningful improvement in function (CMIF): Sustained CMIF goals met  Plan: Pump refill today  Pre-op Assessment:  Ms. Parsley is a 58 y.o. (year old), female patient, seen today for interventional treatment. 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 Ms. Beane has a current medication list which includes the following prescription(s): atorvastatin, cyclobenzaprine, hydroxyzine, lisinopril, [START ON 08/24/2019] oxycodone hcl, [START ON 09/23/2019] oxycodone hcl, [START ON 10/23/2019] oxycodone hcl, [START ON 09/20/2019]  pregabalin, [START ON 10/19/2019] pregabalin, and ropinirole. Her primarily concern today is the Back Pain  Initial Vital Signs:  Pulse/HCG Rate: 72  Temp: (!) 97.4 F (36.3 C) Resp:   BP: (!) 219/112 SpO2: 100 %  BMI: Estimated body mass index is 32.77 kg/m as calculated from the following:   Height as of this encounter: '5\' 3"'  (1.6 m).   Weight as of this encounter: 185 lb (83.9 kg).  Risk Assessment: Allergies: Reviewed. She is allergic to benadryl [diphenhydramine hcl] and bee venom.  Allergy Precautions: None required Coagulopathies: Reviewed. None identified.  Blood-thinner therapy: None at this time Active Infection(s): Reviewed. None identified. Ms. Jabs is afebrile  Site Confirmation: Ms. Wragg was asked to confirm the procedure and laterality before marking the site Procedure checklist: Completed Consent: Before the procedure and under the influence of no sedative(s), amnesic(s), or anxiolytics, the patient was informed of  the treatment options, risks and possible complications. To fulfill our ethical and legal obligations, as recommended by the American Medical Association's Code of Ethics, I have informed the patient of my clinical impression; the nature and purpose of the treatment or procedure; the risks, benefits, and possible complications of the intervention; the alternatives, including doing nothing; the risk(s) and benefit(s) of the alternative treatment(s) or procedure(s); and the risk(s) and benefit(s) of doing nothing.  Ms. Hack was provided with information about the general risks and possible complications associated with most interventional procedures. These include, but are not limited to: failure to achieve desired goals, infection, bleeding, organ or nerve damage, allergic reactions, paralysis, and/or death.  In addition, she was informed of those risks and possible complications associated to this particular procedure, which include, but are not limited to: damage to the implant; failure to decrease pain; local, systemic, or serious CNS infections, intraspinal abscess with possible cord compression and paralysis, or life-threatening such as meningitis; bleeding; organ damage; nerve injury or damage with subsequent sensory, motor, and/or autonomic system dysfunction, resulting in transient or permanent pain, numbness, and/or weakness of one or several areas of the body; allergic reactions, either minor or major life-threatening, such as anaphylactic or anaphylactoid reactions.  Furthermore, Ms. Novick was informed of those risks and complications associated with the medications. These include, but are not limited to: allergic reactions (i.e.: anaphylactic or anaphylactoid reactions); endorphine suppression; bradycardia and/or hypotension; water retention and/or peripheral vascular relaxation leading to lower extremity edema and possible stasis ulcers; respiratory depression and/or shortness of breath;  decreased metabolic rate leading to weight gain; swelling or edema; medication-induced neural toxicity; particulate matter embolism and blood vessel occlusion with resultant organ, and/or nervous system infarction; and/or intrathecal granuloma formation with possible spinal cord compression and permanent paralysis.  Before refilling the pump Ms. Morden was informed that some of the medications used in the devise may not be FDA approved for such use and therefore it constitutes an off-label use of the medications.  Finally, she was informed that Medicine is not an exact science; therefore, there is also the possibility of unforeseen or unpredictable risks and/or possible complications that may result in a catastrophic outcome. The patient indicated having understood very clearly. We have given the patient no guarantees and we have made no promises. Enough time was given to the patient to ask questions, all of which were answered to the patient's satisfaction. Ms. Bilbo has indicated that she wanted to continue with the procedure. Attestation: I, the ordering provider, attest that I have discussed with the patient the benefits, risks, side-effects, alternatives,  likelihood of achieving goals, and potential problems during recovery for the procedure that I have provided informed consent. Date  Time: 08/17/2019 11:14 AM  Pre-Procedure Preparation:  Monitoring: As per clinic protocol. Respiration, ETCO2, SpO2, BP, heart rate and rhythm monitor placed and checked for adequate function Safety Precautions: Patient was assessed for positional comfort and pressure points before starting the procedure. Time-out: I initiated and conducted the "Time-out" before starting the procedure, as per protocol. The patient was asked to participate by confirming the accuracy of the "Time Out" information. Verification of the correct person, site, and procedure were performed and confirmed by me, the nursing staff, and  the patient. "Time-out" conducted as per Joint Commission's Universal Protocol (UP.01.01.01). Time: 1131  Description of Procedure:          Position: Supine Target Area: Central-port of intrathecal pump. Approach: Anterior, 90 degree angle approach. Area Prepped: Entire Area around the pump implant. Prepping solution: DuraPrep (Iodine Povacrylex [0.7% available iodine] and Isopropyl Alcohol, 74% w/w) Safety Precautions: Aspiration looking for blood return was conducted prior to all injections. At no point did we inject any substances, as a needle was being advanced. No attempts were made at seeking any paresthesias. Safe injection practices and needle disposal techniques used. Medications properly checked for expiration dates. SDV (single dose vial) medications used. Description of the Procedure: Protocol guidelines were followed. Two nurses trained to do implant refills were present during the entire procedure. The refill medication was checked by both healthcare providers as well as the patient. The patient was included in the "Time-out" to verify the medication. The patient was placed in position. The pump was identified. The area was prepped in the usual manner. The sterile template was positioned over the pump, making sure the side-port location matched that of the pump. Both, the pump and the template were held for stability. The needle provided in the Medtronic Kit was then introduced thru the center of the template and into the central port. The pump content was aspirated and discarded volume documented. The new medication was slowly infused into the pump, thru the filter, making sure to avoid overpressure of the device. The needle was then removed and the area cleansed, making sure to leave some of the prepping solution back to take advantage of its long term bactericidal properties. The pump was interrogated and programmed to reflect the correct medication, volume, and dosage. The program was  printed and taken to the physician for approval. Once checked and signed by the physician, a copy was provided to the patient and another scanned into the EMR. Vitals:   08/17/19 1109  BP: (!) 219/112  Pulse: 72  Temp: (!) 97.4 F (36.3 C)  SpO2: 100%  Weight: 185 lb (83.9 kg)  Height: '5\' 3"'  (1.6 m)    Start Time: 1131 hrs. End Time:   hrs. Materials & Medications: Medtronic Refill Kit Medication(s): Please see chart orders for details.  Imaging Guidance:          Type of Imaging Technique: None used Indication(s): N/A Exposure Time: No patient exposure Contrast: None used. Fluoroscopic Guidance: N/A Ultrasound Guidance: N/A Interpretation: N/A  Antibiotic Prophylaxis:   Anti-infectives (From admission, onward)   None     Indication(s): None identified  Post-operative Assessment:  Post-procedure Vital Signs:  Pulse/HCG Rate: 72  Temp: (!) 97.4 F (36.3 C) Resp:   BP: (!) 219/112 SpO2: 100 %  EBL: None  Complications: No immediate post-treatment complications observed by team, or reported by patient.  Note: The patient tolerated the entire procedure well. A repeat set of vitals were taken after the procedure and the patient was kept under observation following institutional policy, for this type of procedure. Post-procedural neurological assessment was performed, showing return to baseline, prior to discharge. The patient was provided with post-procedure discharge instructions, including a section on how to identify potential problems. Should any problems arise concerning this procedure, the patient was given instructions to immediately contact us, at any time, without hesitation. In any case, we plan to contact the patient by telephone for a follow-up status report regarding this interventional procedure.  Comments:  No additional relevant information.  Plan of Care  Orders:  Orders Placed This Encounter  Procedures  . Pump Refill (Today)    Maintain Protocol by  having two(2) healthcare providers during procedure and programming.    Scheduling Instructions:     Please refill intrathecal pump today.    Order Specific Question:   Where will this procedure be performed?    Answer:   [facility]  . Pump Refill (Schedule Return)    Whenever possible schedule on a procedure today.    Standing Status:   Future    Standing Expiration Date:   01/14/2020    Scheduling Instructions:     Please schedule intrathecal pump refill based on pump programming. Avoid schedule intervals of more than 120 days (4 months).    Order Specific Question:   Where will this procedure be performed?    Answer:   [facility]  . Consent: Pump Refill    Provider Attestation: I, Ribera Dossie Arbour, MD, (Pain Management Specialist), the physician/practitioner, attest that I have discussed with the patient the benefits, risks, side effects, alternatives, likelihood of achieving goals and potential problems during recovery for the procedure that I have provided informed consent.    Scheduling Instructions:     Procedure: Intrathecal Pump Refill     Attending Physician: Beatriz Chancellor A. Dossie Arbour, MD     Indications: Chronic Pain Syndrome (G89.4)     Transcribe to consent form and obtain patient signature.   Chronic Opioid Analgesic:   Oxycodone IR 65m 4x/day(434mdayof oxycodone) (60MME/day) +  PF-(intrathecal)-Fentanyl (13.4 mcg/hr) MME/day: 60MME/day (oral) + 32.16 MME/day (intrathecal) = 93.16 MME/day (Aprox. 1.16 MME/day/kg).   Medications ordered for procedure: Meds ordered this encounter  Medications  . Oxycodone HCl 10 MG TABS    Sig: Take 1 tablet (10 mg total) by mouth every 6 (six) hours as needed. Max: 4/day. Must last 30 days.    Dispense:  120 tablet    Refill:  0    Chronic Pain: STOP Act (Not applicable) Fill 1 day early if closed on refill date. Do not fill until: 09/23/2019. To last until: 10/23/2019. Avoid benzodiazepines within 8 hours of  opioids  . Oxycodone HCl 10 MG TABS    Sig: Take 1 tablet (10 mg total) by mouth every 6 (six) hours as needed. Max: 4/day. Must last 30 days.    Dispense:  120 tablet    Refill:  0    Chronic Pain: STOP Act (Not applicable) Fill 1 day early if closed on refill date. Do not fill until: 10/23/2019. To last until: 11/22/2019. Avoid benzodiazepines within 8 hours of opioids  . pregabalin (LYRICA) 50 MG capsule    Sig: Take 1 capsule (50 mg total) by mouth 2 (two) times daily for 15 days, THEN 1 capsule (50 mg total) 3 (three) times daily for 15 days.  Dispense:  75 capsule    Refill:  0    Fill one day early if pharmacy is closed on scheduled refill date. May substitute for generic if available.  . pregabalin (LYRICA) 75 MG capsule    Sig: Take 1 capsule (75 mg total) by mouth 2 (two) times daily for 15 days, THEN 1 capsule (75 mg total) 3 (three) times daily for 15 days.    Dispense:  75 capsule    Refill:  0    Fill one day early if pharmacy is closed on scheduled refill date. May substitute for generic if available.   Medications administered: Hassan Rowan A. Shifflet had no medications administered during this visit.  See the medical record for exact dosing, route, and time of administration.  Follow-up plan:   Return for Pump Refill (Max:79mo.       Interventional management options: Planned, scheduled, and/or pending:   Intrathecal pump refill as per pump programming.   Considering:   Diagnostic left intra-articular knee joint aspiration and injection of local anesthetic and steroid  Diagnostic bilateral L2-3 transforaminal epidural steroid injection  Diagnostic left genicular nerve block  Possible left genicular nerve RFA  Palliative/therapeutic intrathecal pump analysis and refill Diagnostic caudal epidural steroid injection + diagnostic epidurogram Possible Racz procedure Diagnostic bilateral lumbar facet block Possible bilateral lumbar facet RFA (unlikely due to extensive  lumbar fusion from L3-S1)    Palliative PRN treatment(s):   None at this time     Recent Visits Date Type Provider Dept  06/22/19 Office Visit NMilinda Pointer MD [facility]  Showing recent visits within past 90 days and meeting all other requirements   Today's Visits Date Type Provider Dept  08/17/19 Procedure visit NMilinda Pointer MD [facility]  Showing today's visits and meeting all other requirements   Future Appointments No visits were found meeting these conditions.  Showing future appointments within next 90 days and meeting all other requirements   Disposition: Discharge home  Discharge Date & Time: 08/17/2019; 1149 hrs.   Primary Care Physician: System, Pcp Not In [facility] Note by: FGaspar Cola MD Date: 08/17/2019; Time: 12:30 PM  Disclaimer:  Medicine is not an eChief Strategy Officer The only guarantee in medicine is that nothing is guaranteed. It is important to note that the decision to proceed with this intervention was based on the information collected from the patient. The Data and conclusions were drawn from the patient's questionnaire, the interview, and the physical examination. Because the information was provided in large part by the patient, it cannot be guaranteed that it has not been purposely or unconsciously manipulated. Every effort has been made to obtain as much relevant data as possible for this evaluation. It is important to note that the conclusions that lead to this procedure are derived in large part from the available data. Always take into account that the treatment will also be dependent on availability of resources and existing treatment guidelines, considered by other Pain Management Practitioners as being common knowledge and practice, at the time of the intervention. For Medico-Legal purposes, it is also important to point out that variation in procedural techniques and  pharmacological choices are the acceptable norm. The indications, contraindications, technique, and results of the above procedure should only be interpreted and judged by a Board-Certified Interventional Pain Specialist with extensive familiarity and expertise in the same exact procedure and technique.

## 2019-08-18 ENCOUNTER — Telehealth: Payer: Self-pay

## 2019-08-18 NOTE — Telephone Encounter (Signed)
Post pump refill  Call back.  LM

## 2019-08-20 MED FILL — Medication: INTRATHECAL | Qty: 1 | Status: AC

## 2019-09-25 ENCOUNTER — Telehealth: Payer: Self-pay | Admitting: Pain Medicine

## 2019-09-25 NOTE — Telephone Encounter (Signed)
[employer] called regarding authiorization for oxycodone, sent call to [employer]

## 2019-09-25 NOTE — Telephone Encounter (Signed)
I completed this online through [facility]-  [employer]

## 2019-10-26 ENCOUNTER — Other Ambulatory Visit: Payer: Self-pay

## 2019-10-26 MED ORDER — PAIN MANAGEMENT IT PUMP REFILL: 1.0000 | Freq: Once | INTRATHECAL | 0 refills | Status: AC

## 2019-11-16 ENCOUNTER — Encounter: Payer: Medicare HMO | Admitting: Pain Medicine

## 2019-11-16 NOTE — Progress Notes (Deleted)
PROVIDER NOTE: Information contained herein reflects review and annotations entered in association with encounter. Interpretation of such information and data should be left to medically-trained personnel. Information provided to patient can be located elsewhere in the medical record under "Patient Instructions". Document created using STT-dictation technology, any transcriptional errors that may result from process are unintentional.    Patient: Kendra Nelson  Service Category: Procedure  Provider: Gaspar Cola, MD  DOB: February 09, 1961  DOS: 11/16/2019  Location: [facility]  MRN: [id number]  Setting: Ambulatory - outpatient  Referring Provider: No ref. provider found  Type: Established Patient  Specialty: Interventional Pain Management  PCP: System, Pcp Not In   Primary Reason for Visit: Interventional Pain Management Treatment. CC: No chief complaint on file.  Procedure:          Intrathecal Drug Delivery System (IDDS):  Type: Reservoir Refill [phone]) No rate change Region: Abdominal Laterality: Left  Type of Pump: Medtronic Synchromed II Delivery Route: Intrathecal Type of Pain Treated: Neuropathic/Nociceptive Primary Medication Class: Opioid/opiate  Medication, Concentration, Infusion Program, & Delivery Rate: Please see scanned programming printout.   Indications: 1. Chronic pain syndrome   2. Chronic low back pain (Primary Area of Pain) (Bilateral) (R>L)   3. 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 Chronic lumbosacral radiculopathy (L5) (Left)   6. Presence of intrathecal pump   7. Adjustment and management of infusion pump    Pain Assessment: Self-Reported Pain Score:  /10             Reported level is compatible with observation.        Pharmacotherapy Assessment  Analgesic: Oxycodone IR 6m 4x/day(477mdayof oxycodone) (60MME/day) +   PF-(intrathecal)-Fentanyl (13.4 mcg/hr) MME/day: 60MME/day (oral) + 32.16 MME/day (intrathecal) = 93.16 MME/day (Aprox. 1.16 MME/day/kg).   Monitoring: [place] PMP: PDMP reviewed during this encounter.       Pharmacotherapy: No side-effects or adverse reactions reported. Compliance: No problems identified. Effectiveness: Clinically acceptable. Plan: Refer to "POC".  UDS:  Summary  Date Value Ref Range Status  03/23/2019 Note  Final    Comment:    ==================================================================== [employer] Select 13 (MW) ==================================================================== Test                             Result       Flag       Units Drug Present and Declared for Prescription Verification   Oxycodone                      4297         EXPECTED   ng/mg creat   Oxymorphone                    1874         EXPECTED   ng/mg creat   Noroxycodone                   3680         EXPECTED   ng/mg creat   Noroxymorphone                 467          EXPECTED   ng/mg creat    Sources of oxycodone are scheduled prescription medications.    Oxymorphone, noroxycodone, and noroxymorphone are expected  metabolites of oxycodone. Oxymorphone is also available as a    scheduled prescription medication. Drug Present not Declared for Prescription Verification   Fentanyl                       38           UNEXPECTED ng/mg creat   Norfentanyl                    57           UNEXPECTED ng/mg creat    Source of fentanyl is a scheduled prescription medication, including    IV, patch, and transmucosal formulations. Norfentanyl is an expected    metabolite of fentanyl. ==================================================================== Test                      Result    Flag   Units      Ref Range   Creatinine              107              mg/dL      >=20 ==================================================================== Declared Medications:  The flagging and  interpretation on this report are based on the  following declared medications.  Unexpected results may arise from  inaccuracies in the declared medications.  **Note: The testing scope of this panel includes these medications:  Oxycodone  **Note: The testing scope of this panel does not include the  following reported medications:  Acitretin  Atorvastatin  Cyclobenzaprine  Lisinopril  Meloxicam  Metoprolol  Multivitamin  Ropinirole (Requip) ==================================================================== For clinical consultation, please call [phone]. ====================================================================    Intrathecal Pump Therapy Assessment  Manufacturer: Medtronic Synchromed Type: Programmable Volume: 40 mL reservoir MRI compatibility: Yes   Drug content:  Primary Medication Class: Opioid Primary Medication: PF-Fentanyl (1,000 mcg/mL)  Secondary Medication: PF-Bupivacaine (20.0 mg/mL)  Other Medication: None   Programming:  Type: Simple continuous. See pump readout for details.   Changes:  Medication Change: None at this point Rate Change: No change in rate  Reported side-effects or adverse reactions: None reported  Effectiveness: Described as relatively effective, allowing for increase in activities of daily living (ADL) Clinically meaningful improvement in function (CMIF): Sustained CMIF goals met  Plan: Pump refill today  Pre-op Assessment:  Kendra Nelson is a 59 y.o. (year old), female patient, seen today for interventional treatment. 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 Kendra Nelson has a current medication list which includes the  following prescription(s): atorvastatin, cyclobenzaprine, hydroxyzine, lisinopril, oxycodone hcl, oxycodone hcl, oxycodone hcl, pregabalin, pregabalin, and ropinirole. Her primarily concern today is the No chief complaint on file.  Initial Vital Signs:  Pulse/HCG Rate:    Temp:   Resp:   BP:   SpO2:    BMI: Estimated body mass index is 32.77 kg/m as calculated from the following:   Height as of 08/17/19: _0  (1.6 m).   Weight as of 08/17/19: 185 lb (83.9 kg).  Risk Assessment: Allergies: Reviewed. She is allergic to benadryl [diphenhydramine hcl] and bee venom.  Allergy Precautions: None required Coagulopathies: Reviewed. None identified.  Blood-thinner therapy: None at this time Active Infection(s): Reviewed. None identified. Ms. Falk is afebrile  Site Confirmation: Ms. Consolo was  asked to confirm the procedure and laterality before marking the site Procedure checklist: Completed Consent: Before the procedure and under the influence of no sedative(s), amnesic(s), or anxiolytics, the patient was informed of the treatment options, risks and possible complications. To fulfill our ethical and legal obligations, as recommended by the American Medical Association's Code of Ethics, I have informed the patient of my clinical impression; the nature and purpose of the treatment or procedure; the risks, benefits, and possible complications of the intervention; the alternatives, including doing nothing; the risk(s) and benefit(s) of the alternative treatment(s) or procedure(s); and the risk(s) and benefit(s) of doing nothing.  Ms. Pincock was provided with information about the general risks and possible complications associated with most interventional procedures. These include, but are not limited to: failure to achieve desired goals, infection, bleeding, organ or nerve damage, allergic reactions, paralysis, and/or death.  In addition, she was informed of those risks and possible  complications associated to this particular procedure, which include, but are not limited to: damage to the implant; failure to decrease pain; local, systemic, or serious CNS infections, intraspinal abscess with possible cord compression and paralysis, or life-threatening such as meningitis; bleeding; organ damage; nerve injury or damage with subsequent sensory, motor, and/or autonomic system dysfunction, resulting in transient or permanent pain, numbness, and/or weakness of one or several areas of the body; allergic reactions, either minor or major life-threatening, such as anaphylactic or anaphylactoid reactions.  Furthermore, Ms. Equihua was informed of those risks and complications associated with the medications. These include, but are not limited to: allergic reactions (i.e.: anaphylactic or anaphylactoid reactions); endorphine suppression; bradycardia and/or hypotension; water retention and/or peripheral vascular relaxation leading to lower extremity edema and possible stasis ulcers; respiratory depression and/or shortness of breath; decreased metabolic rate leading to weight gain; swelling or edema; medication-induced neural toxicity; particulate matter embolism and blood vessel occlusion with resultant organ, and/or nervous system infarction; and/or intrathecal granuloma formation with possible spinal cord compression and permanent paralysis.  Before refilling the pump Ms. Gartland was informed that some of the medications used in the devise may not be FDA approved for such use and therefore it constitutes an off-label use of the medications.  Finally, she was informed that Medicine is not an exact science; therefore, there is also the possibility of unforeseen or unpredictable risks and/or possible complications that may result in a catastrophic outcome. The patient indicated having understood very clearly. We have given the patient no guarantees and we have made no promises. Enough time was given  to the patient to ask questions, all of which were answered to the patient's satisfaction. Ms. Sealey has indicated that she wanted to continue with the procedure. Attestation: I, the ordering provider, attest that I have discussed with the patient the benefits, risks, side-effects, alternatives, likelihood of achieving goals, and potential problems during recovery for the procedure that I have provided informed consent. Date  Time: {CHL [facility]-PAIN TIME CHOICES:[id number]}  Pre-Procedure Preparation:  Monitoring: As per clinic protocol. Respiration, ETCO2, SpO2, BP, heart rate and rhythm monitor placed and checked for adequate function Safety Precautions: Patient was assessed for positional comfort and pressure points before starting the procedure. Time-out: I initiated and conducted the "Time-out" before starting the procedure, as per protocol. The patient was asked to participate by confirming the accuracy of the "Time Out" information. Verification of the correct person, site, and procedure were performed and confirmed by me, the nursing staff, and the patient. "Time-out" conducted as per Joint Commission's Universal Protocol (  UP.01.01.01). Time:    Description of Procedure:          Position: Supine Target Area: Central-port of intrathecal pump. Approach: Anterior, 90 degree angle approach. Area Prepped: Entire Area around the pump implant. Prepping solution: DuraPrep (Iodine Povacrylex [0.7% available iodine] and Isopropyl Alcohol, 74% w/w) Safety Precautions: Aspiration looking for blood return was conducted prior to all injections. At no point did we inject any substances, as a needle was being advanced. No attempts were made at seeking any paresthesias. Safe injection practices and needle disposal techniques used. Medications properly checked for expiration dates. SDV (single dose vial) medications used. Description of the Procedure: Protocol guidelines were followed. Two nurses trained to  do implant refills were present during the entire procedure. The refill medication was checked by both healthcare providers as well as the patient. The patient was included in the "Time-out" to verify the medication. The patient was placed in position. The pump was identified. The area was prepped in the usual manner. The sterile template was positioned over the pump, making sure the side-port location matched that of the pump. Both, the pump and the template were held for stability. The needle provided in the Medtronic Kit was then introduced thru the center of the template and into the central port. The pump content was aspirated and discarded volume documented. The new medication was slowly infused into the pump, thru the filter, making sure to avoid overpressure of the device. The needle was then removed and the area cleansed, making sure to leave some of the prepping solution back to take advantage of its long term bactericidal properties. The pump was interrogated and programmed to reflect the correct medication, volume, and dosage. The program was printed and taken to the physician for approval. Once checked and signed by the physician, a copy was provided to the patient and another scanned into the EMR. There were no vitals filed for this visit.  Start Time:   hrs. End Time:   hrs. Materials & Medications: Medtronic Refill Kit Medication(s): Please see chart orders for details.  Imaging Guidance:          Type of Imaging Technique: None used Indication(s): N/A Exposure Time: No patient exposure Contrast: None used. Fluoroscopic Guidance: N/A Ultrasound Guidance: N/A Interpretation: N/A  Antibiotic Prophylaxis:   Anti-infectives (From admission, onward)   None     Indication(s): None identified  Post-operative Assessment:  Post-procedure Vital Signs:  Pulse/HCG Rate:    Temp:   Resp:   BP:   SpO2:    EBL: None  Complications: No immediate post-treatment complications observed  by team, or reported by patient.  Note: The patient tolerated the entire procedure well. A repeat set of vitals were taken after the procedure and the patient was kept under observation following institutional policy, for this type of procedure. Post-procedural neurological assessment was performed, showing return to baseline, prior to discharge. The patient was provided with post-procedure discharge instructions, including a section on how to identify potential problems. Should any problems arise concerning this procedure, the patient was given instructions to immediately contact us, at any time, without hesitation. In any case, we plan to contact the patient by telephone for a follow-up status report regarding this interventional procedure.  Comments:  No additional relevant information.  Plan of Care  Orders:  No orders of the defined types were placed in this encounter.  Chronic Opioid Analgesic:  Oxycodone IR 102m 4x/day(462mdayof oxycodone) (60MME/day) +  PF-(intrathecal)-Fentanyl (13.4 mcg/hr) MME/day: 60MME/day (oral) +  32.16 MME/day (intrathecal) = 93.16 MME/day (Aprox. 1.16 MME/day/kg).   Medications ordered for procedure: No orders of the defined types were placed in this encounter.  Medications administered: Hassan Rowan A. Corlew had no medications administered during this visit.  See the medical record for exact dosing, route, and time of administration.  Follow-up plan:   No follow-ups on file.       Interventional management options: Planned, scheduled, and/or pending:   Intrathecal pump refill as per pump programming.   Under consideration:   Diagnostic left IA knee joint inj  Diagnostic bilateral L2 TFESI  Diagnostic left genicular NB  Possible left genicular nerve RFA  Diagnostic caudal ESI + epidurogram Possible Racz procedure Diagnostic bilateral lumbar facet block Possible bilateral lumbar facet RFA (unlikely due to extensive lumbar fusion from L3-S1)     Therapeutic/palliative (PRN):   Therapeutic/palliative intrathecal pump  management (analysis and refill)     Recent Visits No visits were found meeting these conditions.  Showing recent visits within past 90 days and meeting all other requirements   Today's Visits Date Type Provider Dept  11/16/19 Appointment Milinda Pointer, MD [facility]  Showing today's visits and meeting all other requirements   Future Appointments No visits were found meeting these conditions.  Showing future appointments within next 90 days and meeting all other requirements   Disposition: Discharge home  Discharge (Date  Time): 11/16/2019;   hrs.   Primary Care Physician: System, Pcp Not In Location: [facility] Note by: Gaspar Cola, MD Date: 11/16/2019; Time: 7:12 AM  Disclaimer:  Medicine is not an Chief Strategy Officer. The only guarantee in medicine is that nothing is guaranteed. It is important to note that the decision to proceed with this intervention was based on the information collected from the patient. The Data and conclusions were drawn from the patient's questionnaire, the interview, and the physical examination. Because the information was provided in large part by the patient, it cannot be guaranteed that it has not been purposely or unconsciously manipulated. Every effort has been made to obtain as much relevant data as possible for this evaluation. It is important to note that the conclusions that lead to this procedure are derived in large part from the available data. Always take into account that the treatment will also be dependent on availability of resources and existing treatment guidelines, considered by other Pain Management Practitioners as being common knowledge and practice, at the time of the intervention. For Medico-Legal purposes, it is also important to point out that variation in procedural techniques and pharmacological choices are the  acceptable norm. The indications, contraindications, technique, and results of the above procedure should only be interpreted and judged by a Board-Certified Interventional Pain Specialist with extensive familiarity and expertise in the same exact procedure and technique.

## 2019-11-17 ENCOUNTER — Other Ambulatory Visit: Payer: Self-pay

## 2019-11-17 MED ORDER — PAIN MANAGEMENT IT PUMP REFILL: 1.0000 | Freq: Once | INTRATHECAL | 0 refills | Status: AC

## 2019-11-17 MED ORDER — PAIN MANAGEMENT IT PUMP REFILL: 1.0000 | Freq: Once | INTRATHECAL | 0 refills | Status: DC

## 2019-11-23 ENCOUNTER — Encounter: Payer: Self-pay | Admitting: Pain Medicine

## 2019-11-23 ENCOUNTER — Other Ambulatory Visit: Payer: Self-pay

## 2019-11-23 ENCOUNTER — Ambulatory Visit: Payer: Medicare HMO | Attending: Pain Medicine | Admitting: Pain Medicine

## 2019-11-23 VITALS — BP 157/77 | HR 80 | Temp 98.1°F | Resp 16 | Ht 63.0 in | Wt 165.0 lb

## 2019-11-23 DIAGNOSIS — G8929 Other chronic pain: Secondary | ICD-10-CM

## 2019-11-23 DIAGNOSIS — M5417 Radiculopathy, lumbosacral region: Secondary | ICD-10-CM | POA: Diagnosis present

## 2019-11-23 DIAGNOSIS — M5441 Lumbago with sciatica, right side: Secondary | ICD-10-CM | POA: Diagnosis present

## 2019-11-23 DIAGNOSIS — G894 Chronic pain syndrome: Secondary | ICD-10-CM | POA: Diagnosis not present

## 2019-11-23 DIAGNOSIS — M5442 Lumbago with sciatica, left side: Secondary | ICD-10-CM | POA: Diagnosis present

## 2019-11-23 DIAGNOSIS — F1190 Opioid use, unspecified, uncomplicated: Secondary | ICD-10-CM | POA: Insufficient documentation

## 2019-11-23 DIAGNOSIS — Z451 Encounter for adjustment and management of infusion pump: Secondary | ICD-10-CM | POA: Diagnosis present

## 2019-11-23 DIAGNOSIS — M961 Postlaminectomy syndrome, not elsewhere classified: Secondary | ICD-10-CM | POA: Insufficient documentation

## 2019-11-23 DIAGNOSIS — M79605 Pain in left leg: Secondary | ICD-10-CM | POA: Diagnosis present

## 2019-11-23 DIAGNOSIS — M79604 Pain in right leg: Secondary | ICD-10-CM | POA: Insufficient documentation

## 2019-11-23 DIAGNOSIS — Z978 Presence of other specified devices: Secondary | ICD-10-CM | POA: Diagnosis present

## 2019-11-23 MED ORDER — OXYCODONE HCL 10 MG PO TABS: 10.0000 mg | ORAL_TABLET | Freq: Four times a day (QID) | ORAL | 0 refills | Status: DC | PRN

## 2019-11-23 NOTE — Progress Notes (Signed)
PROVIDER NOTE: Information contained herein reflects review and annotations entered in association with encounter. Interpretation of such information and data should be left to medically-trained personnel. Information provided to patient can be located elsewhere in the medical record under "Patient Instructions". Document created using STT-dictation technology, any transcriptional errors that may result from process are unintentional.    Patient: Kendra Nelson  Service Category: Procedure  Provider: Gaspar Cola, MD  DOB: 1961/08/12  DOS: 11/23/2019  Location: [facility]  MRN: [id number]  Setting: Ambulatory - outpatient  Referring Provider: No ref. provider found  Type: Established Patient  Specialty: Interventional Pain Management  PCP: System, Pcp Not In   Primary Reason for Visit: Interventional Pain Management Treatment. CC: Back Pain (lower)  Procedure:          Intrathecal Drug Delivery System (IDDS):  Type: Reservoir Refill [phone]) No rate change Region: Abdominal Laterality: Left  Type of Pump: Medtronic Synchromed II Delivery Route: Intrathecal Type of Pain Treated: Neuropathic/Nociceptive Primary Medication Class: Opioid/opiate  Medication, Concentration, Infusion Program, & Delivery Rate: Please see scanned programming printout.   Indications: 1. Chronic pain syndrome   2. Chronic low back pain (Primary Area of Pain) (Bilateral) (R>L)   3. 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 Chronic lumbosacral radiculopathy (L5) (Left)   6. Presence of intrathecal pump   7. Adjustment and management of infusion pump    Pain Assessment: Self-Reported Pain Score: 5 /10             Reported level is compatible with observation.        Pharmacotherapy Assessment  Analgesic: Oxycodone IR 79m 4x/day(457mdayof oxycodone) (60MME/day) +   PF-(intrathecal)-Fentanyl (13.4 mcg/hr) MME/day: 60MME/day (oral) + 32.16 MME/day (intrathecal) = 93.16 MME/day (Aprox. 1.16 MME/day/kg).   Monitoring: [place] PMP: PDMP reviewed during this encounter.       Pharmacotherapy: No side-effects or adverse reactions reported. Compliance: No problems identified. Effectiveness: Clinically acceptable. Plan: Refer to "POC".  UDS:  Summary  Date Value Ref Range Status  03/23/2019 Note  Final    Comment:    ==================================================================== [employer] Select 13 (MW) ==================================================================== Test                             Result       Flag       Units Drug Present and Declared for Prescription Verification   Oxycodone                      4297         EXPECTED   ng/mg creat   Oxymorphone                    1874         EXPECTED   ng/mg creat   Noroxycodone                   3680         EXPECTED   ng/mg creat   Noroxymorphone                 467          EXPECTED   ng/mg creat    Sources of oxycodone are scheduled prescription medications.    Oxymorphone, noroxycodone, and noroxymorphone are expected  metabolites of oxycodone. Oxymorphone is also available as a    scheduled prescription medication. Drug Present not Declared for Prescription Verification   Fentanyl                       38           UNEXPECTED ng/mg creat   Norfentanyl                    57           UNEXPECTED ng/mg creat    Source of fentanyl is a scheduled prescription medication, including    IV, patch, and transmucosal formulations. Norfentanyl is an expected    metabolite of fentanyl. ==================================================================== Test                      Result    Flag   Units      Ref Range   Creatinine              107              mg/dL      >=20 ==================================================================== Declared Medications:  The flagging and  interpretation on this report are based on the  following declared medications.  Unexpected results may arise from  inaccuracies in the declared medications.  **Note: The testing scope of this panel includes these medications:  Oxycodone  **Note: The testing scope of this panel does not include the  following reported medications:  Acitretin  Atorvastatin  Cyclobenzaprine  Lisinopril  Meloxicam  Metoprolol  Multivitamin  Ropinirole (Requip) ==================================================================== For clinical consultation, please call [phone]. ====================================================================    Intrathecal Pump Therapy Assessment  Manufacturer: Medtronic Synchromed Type: Programmable Volume: 40 mL reservoir MRI compatibility: Yes   Drug content:  Primary Medication Class: Opioid Primary Medication: PF-Fentanyl (1,000 mcg/mL)  Secondary Medication: PF-Bupivacaine (20.0 mg/mL)  Other Medication: None   Programming:  Type: Simple continuous. See pump readout for details.   Changes:  Medication Change: None at this point Rate Change: No change in rate  Reported side-effects or adverse reactions: None reported  Effectiveness: Described as relatively effective, allowing for increase in activities of daily living (ADL) Clinically meaningful improvement in function (CMIF): Sustained CMIF goals met  Plan: Pump refill today  Pre-op Assessment:  Kendra Nelson is a 59 y.o. (year old), female patient, seen today for interventional treatment. 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 Kendra Nelson has a current medication list which includes the  following prescription(s): atorvastatin, cyclobenzaprine, hydroxyzine, lisinopril, ropinirole, oxycodone hcl, [START ON 12/23/2019] oxycodone hcl, [START ON 01/22/2020] oxycodone hcl, pregabalin, and pregabalin. Her primarily concern today is the Back Pain (lower)  Initial Vital Signs:  Pulse/HCG Rate: 80  Temp: 98.1 F (36.7 C) Resp: 16 BP: (!) 157/77 SpO2: 98 %  BMI: Estimated body mass index is 29.23 kg/m as calculated from the following:   Height as of this encounter: _0  (1.6 m).   Weight as of this encounter: 165 lb (74.8 kg).  Risk Assessment: Allergies: Reviewed. She is allergic to benadryl [diphenhydramine hcl] and bee venom.  Allergy Precautions: None required Coagulopathies: Reviewed. None identified.  Blood-thinner therapy: None at this time Active Infection(s): Reviewed. None identified. Ms. Sundberg is afebrile  Site Confirmation: Ms. Baise was asked to confirm the procedure and laterality before marking the site Procedure checklist: Completed Consent: Before the procedure and under the influence of no sedative(s), amnesic(s), or anxiolytics, the patient was informed of the treatment options, risks and possible complications. To fulfill our ethical and legal obligations, as recommended by the American Medical Association's Code of Ethics, I have informed the patient of my clinical impression; the nature and purpose of the treatment or procedure; the risks, benefits, and possible complications of the intervention; the alternatives, including doing nothing; the risk(s) and benefit(s) of the alternative treatment(s) or procedure(s); and the risk(s) and benefit(s) of doing nothing.  Ms. Stefanski was provided with information about the general risks and possible complications associated with most interventional procedures. These include, but are not limited to: failure to achieve desired goals, infection, bleeding, organ or nerve damage, allergic reactions, paralysis, and/or  death.  In addition, she was informed of those risks and possible complications associated to this particular procedure, which include, but are not limited to: damage to the implant; failure to decrease pain; local, systemic, or serious CNS infections, intraspinal abscess with possible cord compression and paralysis, or life-threatening such as meningitis; bleeding; organ damage; nerve injury or damage with subsequent sensory, motor, and/or autonomic system dysfunction, resulting in transient or permanent pain, numbness, and/or weakness of one or several areas of the body; allergic reactions, either minor or major life-threatening, such as anaphylactic or anaphylactoid reactions.  Furthermore, Ms. Akopyan was informed of those risks and complications associated with the medications. These include, but are not limited to: allergic reactions (i.e.: anaphylactic or anaphylactoid reactions); endorphine suppression; bradycardia and/or hypotension; water retention and/or peripheral vascular relaxation leading to lower extremity edema and possible stasis ulcers; respiratory depression and/or shortness of breath; decreased metabolic rate leading to weight gain; swelling or edema; medication-induced neural toxicity; particulate matter embolism and blood vessel occlusion with resultant organ, and/or nervous system infarction; and/or intrathecal granuloma formation with possible spinal cord compression and permanent paralysis.  Before refilling the pump Ms. Umbaugh was informed that some of the medications used in the devise may not be FDA approved for such use and therefore it constitutes an off-label use of the medications.  Finally, she was informed that Medicine is not an exact science; therefore, there is also the possibility of unforeseen or unpredictable risks and/or possible complications that may result in a catastrophic outcome. The patient indicated having understood very clearly. We have given the  patient no guarantees and we have made no promises. Enough time was given to the patient to ask questions, all of which were answered to the patient's satisfaction. Ms. Bucaro has indicated that she wanted to continue with the procedure. Attestation: I, the ordering provider, attest that I have discussed with the patient the benefits, risks, side-effects, alternatives, likelihood of achieving goals, and potential problems during recovery for the procedure that I have provided informed consent. Date  Time: 11/23/2019 10:59 AM  Pre-Procedure Preparation:  Monitoring: As per clinic protocol. Respiration, ETCO2, SpO2, BP, heart rate and rhythm monitor placed and checked for adequate function Safety Precautions: Patient was assessed for positional comfort and pressure points before starting the procedure. Time-out: I initiated and conducted the "Time-out" before starting the procedure, as per protocol. The patient was asked to participate by confirming the accuracy of the "Time Out" information. Verification of the correct person, site, and procedure were performed and confirmed by me, the nursing staff, and the patient. "Time-out" conducted as per  Joint Commission's Universal Protocol (UP.01.01.01). Time: 1122  Description of Procedure:          Position: Supine Target Area: Central-port of intrathecal pump. Approach: Anterior, 90 degree angle approach. Area Prepped: Entire Area around the pump implant. Prepping solution: DuraPrep (Iodine Povacrylex [0.7% available iodine] and Isopropyl Alcohol, 74% w/w) Safety Precautions: Aspiration looking for blood return was conducted prior to all injections. At no point did we inject any substances, as a needle was being advanced. No attempts were made at seeking any paresthesias. Safe injection practices and needle disposal techniques used. Medications properly checked for expiration dates. SDV (single dose vial) medications used. Description of the Procedure:  Protocol guidelines were followed. Two nurses trained to do implant refills were present during the entire procedure. The refill medication was checked by both healthcare providers as well as the patient. The patient was included in the "Time-out" to verify the medication. The patient was placed in position. The pump was identified. The area was prepped in the usual manner. The sterile template was positioned over the pump, making sure the side-port location matched that of the pump. Both, the pump and the template were held for stability. The needle provided in the Medtronic Kit was then introduced thru the center of the template and into the central port. The pump content was aspirated and discarded volume documented. The new medication was slowly infused into the pump, thru the filter, making sure to avoid overpressure of the device. The needle was then removed and the area cleansed, making sure to leave some of the prepping solution back to take advantage of its long term bactericidal properties. The pump was interrogated and programmed to reflect the correct medication, volume, and dosage. The program was printed and taken to the physician for approval. Once checked and signed by the physician, a copy was provided to the patient and another scanned into the EMR. Vitals:   11/23/19 1057  BP: (!) 157/77  Pulse: 80  Resp: 16  Temp: 98.1 F (36.7 C)  TempSrc: Temporal  SpO2: 98%  Weight: 165 lb (74.8 kg)  Height: _0  (1.6 m)    Start Time: 1123 hrs. End Time: 1133 hrs. Materials & Medications: Medtronic Refill Kit Medication(s): Please see chart orders for details.  Imaging Guidance:          Type of Imaging Technique: None used Indication(s): N/A Exposure Time: No patient exposure Contrast: None used. Fluoroscopic Guidance: N/A Ultrasound Guidance: N/A Interpretation: N/A  Antibiotic Prophylaxis:   Anti-infectives (From admission, onward)   None     Indication(s): None  identified  Post-operative Assessment:  Post-procedure Vital Signs:  Pulse/HCG Rate: 80  Temp: 98.1 F (36.7 C) Resp: 16 BP: (!) 157/77 SpO2: 98 %  EBL: None  Complications: No immediate post-treatment complications observed by team, or reported by patient.  Note: The patient tolerated the entire procedure well. A repeat set of vitals were taken after the procedure and the patient was kept under observation following institutional policy, for this type of procedure. Post-procedural neurological assessment was performed, showing return to baseline, prior to discharge. The patient was provided with post-procedure discharge instructions, including a section on how to identify potential problems. Should any problems arise concerning this procedure, the patient was given instructions to immediately contact us, at any time, without hesitation. In any case, we plan to contact the patient by telephone for a follow-up status report regarding this interventional procedure.  Comments:  No additional relevant information.  Plan of  Care  Orders:  Orders Placed This Encounter  Procedures  . PUMP REFILL    Maintain Protocol by having two(2) healthcare providers during procedure and programming.    Scheduling Instructions:     Please refill intrathecal pump today.    Order Specific Question:   Where will this procedure be performed?    Answer:   [facility]  . PUMP REFILL    Whenever possible schedule on a procedure today.    Standing Status:   Future    Standing Expiration Date:   04/21/2020    Scheduling Instructions:     Please schedule intrathecal pump refill based on pump programming. Avoid schedule intervals of more than 120 days (4 months).    Order Specific Question:   Where will this procedure be performed?    Answer:   [facility]  . Informed Consent Details: Physician/Practitioner Attestation; Transcribe to consent form and obtain patient signature    Provider  Attestation: I, [place] Dossie Arbour, MD, (Pain Management Specialist), the physician/practitioner, attest that I have discussed with the patient the benefits, risks, side effects, alternatives, likelihood of achieving goals and potential problems during recovery for the procedure that I have provided informed consent.    Scheduling Instructions:     Procedure: Intrathecal Pump Refill     Attending Physician: Beatriz Chancellor A. Dossie Arbour, MD     Indications: Chronic Pain Syndrome (G89.4)     Transcribe to consent form and obtain patient signature.   Chronic Opioid Analgesic:  Oxycodone IR 42m 4x/day(435mdayof oxycodone) (60MME/day) +  PF-(intrathecal)-Fentanyl (13.4 mcg/hr) MME/day: 60MME/day (oral) + 32.16 MME/day (intrathecal) = 93.16 MME/day (Aprox. 1.16 MME/day/kg).   Medications ordered for procedure: Meds ordered this encounter  Medications  . Oxycodone HCl 10 MG TABS    Sig: Take 1 tablet (10 mg total) by mouth every 6 (six) hours as needed. Max: 4/day. Must last 30 days.    Dispense:  120 tablet    Refill:  0    Chronic Pain: STOP Act (Not applicable) Fill 1 day early if closed on refill date. Do not fill until: 11/23/2019. To last until: 12/23/2019. Avoid benzodiazepines within 8 hours of opioids  . Oxycodone HCl 10 MG TABS    Sig: Take 1 tablet (10 mg total) by mouth every 6 (six) hours as needed. Max: 4/day. Must last 30 days.    Dispense:  120 tablet    Refill:  0    Chronic Pain: STOP Act (Not applicable) Fill 1 day early if closed on refill date. Do not fill until: 12/23/2019. To last until: 01/22/2020. Avoid benzodiazepines within 8 hours of opioids  . Oxycodone HCl 10 MG TABS    Sig: Take 1 tablet (10 mg total) by mouth every 6 (six) hours as needed. Max: 4/day. Must last 30 days.    Dispense:  120 tablet    Refill:  0    Chronic Pain: STOP Act (Not applicable) Fill 1 day early if closed on refill date. Do not fill until: 01/22/2020. To last until: 02/21/2020. Avoid  benzodiazepines within 8 hours of opioids   Medications administered: BrHassan Rowan. Carp had no medications administered during this visit.  See the medical record for exact dosing, route, and time of administration.  Follow-up plan:   Return for Pump Refill (Max:26m27mo      Interventional management options: Planned, scheduled, and/or pending:   Intrathecal pump refill as per pump programming.   Under consideration:   Diagnostic left IA knee  joint inj  Diagnostic bilateral L2 TFESI  Diagnostic left genicular NB  Possible left genicular nerve RFA  Diagnostic caudal ESI + epidurogram Possible Racz procedure Diagnostic bilateral lumbar facet block Possible bilateral lumbar facet RFA (unlikely due to extensive lumbar fusion from L3-S1)    Therapeutic/palliative (PRN):   Therapeutic/palliative intrathecal pump  management (analysis and refill)      Recent Visits No visits were found meeting these conditions.  Showing recent visits within past 90 days and meeting all other requirements   Today's Visits Date Type Provider Dept  11/23/19 Procedure visit Milinda Pointer, MD [facility]  Showing today's visits and meeting all other requirements   Future Appointments Date Type Provider Dept  02/20/20 Appointment Milinda Pointer, MD [facility]  Showing future appointments within next 90 days and meeting all other requirements   Disposition: Discharge home  Discharge (Date  Time): 11/23/2019; 1145 hrs.   Primary Care Physician: System, Pcp Not In Location: [facility] Note by: Gaspar Cola, MD Date: 11/23/2019; Time: 12:04 PM  Disclaimer:  Medicine is not an Chief Strategy Officer. The only guarantee in medicine is that nothing is guaranteed. It is important to note that the decision to proceed with this intervention was based on the information collected from the patient. The Data and conclusions were drawn from the  patient's questionnaire, the interview, and the physical examination. Because the information was provided in large part by the patient, it cannot be guaranteed that it has not been purposely or unconsciously manipulated. Every effort has been made to obtain as much relevant data as possible for this evaluation. It is important to note that the conclusions that lead to this procedure are derived in large part from the available data. Always take into account that the treatment will also be dependent on availability of resources and existing treatment guidelines, considered by other Pain Management Practitioners as being common knowledge and practice, at the time of the intervention. For Medico-Legal purposes, it is also important to point out that variation in procedural techniques and pharmacological choices are the acceptable norm. The indications, contraindications, technique, and results of the above procedure should only be interpreted and judged by a Board-Certified Interventional Pain Specialist with extensive familiarity and expertise in the same exact procedure and technique.

## 2019-11-23 NOTE — Progress Notes (Signed)
Safety precautions to be maintained throughout the outpatient stay will include: orient to surroundings, keep bed in low position, maintain call bell within reach at all times, provide assistance with transfer out of bed and ambulation.  

## 2019-11-24 ENCOUNTER — Telehealth: Payer: Self-pay

## 2019-11-24 NOTE — Telephone Encounter (Signed)
Post Pump refill f/u  LM

## 2019-12-01 MED FILL — Medication: INTRATHECAL | Qty: 1 | Status: AC

## 2019-12-07 ENCOUNTER — Other Ambulatory Visit: Payer: Self-pay | Admitting: Pain Medicine

## 2019-12-07 DIAGNOSIS — M792 Neuralgia and neuritis, unspecified: Secondary | ICD-10-CM

## 2019-12-07 DIAGNOSIS — M961 Postlaminectomy syndrome, not elsewhere classified: Secondary | ICD-10-CM

## 2020-02-13 ENCOUNTER — Other Ambulatory Visit: Payer: Self-pay

## 2020-02-13 MED ORDER — PAIN MANAGEMENT IT PUMP REFILL: 1.0000 | Freq: Once | INTRATHECAL | 0 refills | Status: AC

## 2020-02-20 ENCOUNTER — Ambulatory Visit: Payer: Medicare HMO | Attending: Pain Medicine | Admitting: Pain Medicine

## 2020-02-20 ENCOUNTER — Other Ambulatory Visit: Payer: Self-pay

## 2020-02-20 ENCOUNTER — Encounter: Payer: Self-pay | Admitting: Pain Medicine

## 2020-02-20 VITALS — BP 173/80 | HR 46 | Temp 97.2°F | Resp 16 | Ht 63.0 in | Wt 168.0 lb

## 2020-02-20 DIAGNOSIS — M79605 Pain in left leg: Secondary | ICD-10-CM | POA: Insufficient documentation

## 2020-02-20 DIAGNOSIS — M5417 Radiculopathy, lumbosacral region: Secondary | ICD-10-CM | POA: Insufficient documentation

## 2020-02-20 DIAGNOSIS — M961 Postlaminectomy syndrome, not elsewhere classified: Secondary | ICD-10-CM | POA: Insufficient documentation

## 2020-02-20 DIAGNOSIS — M5441 Lumbago with sciatica, right side: Secondary | ICD-10-CM | POA: Diagnosis not present

## 2020-02-20 DIAGNOSIS — Z978 Presence of other specified devices: Secondary | ICD-10-CM | POA: Diagnosis not present

## 2020-02-20 DIAGNOSIS — M79604 Pain in right leg: Secondary | ICD-10-CM | POA: Insufficient documentation

## 2020-02-20 DIAGNOSIS — G8929 Other chronic pain: Secondary | ICD-10-CM

## 2020-02-20 DIAGNOSIS — M5442 Lumbago with sciatica, left side: Secondary | ICD-10-CM | POA: Diagnosis present

## 2020-02-20 DIAGNOSIS — Z451 Encounter for adjustment and management of infusion pump: Secondary | ICD-10-CM | POA: Diagnosis not present

## 2020-02-20 DIAGNOSIS — G894 Chronic pain syndrome: Secondary | ICD-10-CM | POA: Insufficient documentation

## 2020-02-20 DIAGNOSIS — M5136 Other intervertebral disc degeneration, lumbar region: Secondary | ICD-10-CM | POA: Insufficient documentation

## 2020-02-20 MED ORDER — OXYCODONE HCL 10 MG PO TABS: 10.0000 mg | ORAL_TABLET | Freq: Four times a day (QID) | ORAL | 0 refills | Status: DC | PRN

## 2020-02-20 NOTE — Progress Notes (Signed)
PROVIDER NOTE: Information contained herein reflects review and annotations entered in association with encounter. Interpretation of such information and data should be left to medically-trained personnel. Information provided to patient can be located elsewhere in the medical record under "Patient Instructions". Document created using STT-dictation technology, any transcriptional errors that may result from process are unintentional.    Patient: Kendra Nelson  Service Category: Procedure  Provider: Gaspar Cola, MD  DOB: 17-Jun-1961  DOS: 02/20/2020  Location: [facility]  MRN: [id number]  Setting: Ambulatory - outpatient  Referring Provider: No ref. provider found  Type: Established Patient  Specialty: Interventional Pain Management  PCP: System, Pcp Not In   Primary Reason for Visit: Interventional Pain Management Treatment. CC: Back Pain (lower)  Procedure:          Intrathecal Drug Delivery System (IDDS):  Type: Reservoir Refill [phone]) No rate change Region: Abdominal Laterality: Left  Type of Pump: Medtronic Synchromed II Delivery Route: Intrathecal Type of Pain Treated: Neuropathic/Nociceptive Primary Medication Class: Opioid/opiate  Medication, Concentration, Infusion Program, & Delivery Rate: Please see scanned programming printout.   Indications: 1. 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 DDD (degenerative disc disease), lumbar   4. Chronic low back pain (Primary Area of Pain) (Bilateral) (R>L)   5. Chronic lower extremity pain (Secondary Area of Pain) (Bilateral) (R>L)   6. Chronic lumbosacral radiculopathy (L5) (Left)   7. Presence of intrathecal pump   8. Adjustment and management of infusion pump    Pain Assessment: Self-Reported Pain Score: 4 /10             Reported level is compatible with observation.        Pharmacotherapy Assessment  Analgesic: Oxycodone IR 70m  4x/day(465mdayof oxycodone) (60MME/day) +  PF-(intrathecal)-Fentanyl (13.4 mcg/hr) MME/day: 60MME/day (oral) + 32.16 MME/day (intrathecal) = 93.16 MME/day (Aprox. 1.16 MME/day/kg).   Monitoring: [place] PMP: PDMP reviewed during this encounter.       Pharmacotherapy: No side-effects or adverse reactions reported. Compliance: No problems identified. Effectiveness: Clinically acceptable. Plan: Refer to "POC".  UDS:  Summary  Date Value Ref Range Status  03/23/2019 Note  Final    Comment:    ==================================================================== [employer] Select 13 (MW) ==================================================================== Test                             Result       Flag       Units Drug Present and Declared for Prescription Verification   Oxycodone                      4297         EXPECTED   ng/mg creat   Oxymorphone                    1874         EXPECTED   ng/mg creat   Noroxycodone                   3680         EXPECTED   ng/mg creat   Noroxymorphone                 467          EXPECTED   ng/mg creat    Sources of oxycodone are scheduled prescription medications.    Oxymorphone,  noroxycodone, and noroxymorphone are expected    metabolites of oxycodone. Oxymorphone is also available as a    scheduled prescription medication. Drug Present not Declared for Prescription Verification   Fentanyl                       38           UNEXPECTED ng/mg creat   Norfentanyl                    57           UNEXPECTED ng/mg creat    Source of fentanyl is a scheduled prescription medication, including    IV, patch, and transmucosal formulations. Norfentanyl is an expected    metabolite of fentanyl. ==================================================================== Test                      Result    Flag   Units      Ref Range   Creatinine              107              mg/dL       >=20 ==================================================================== Declared Medications:  The flagging and interpretation on this report are based on the  following declared medications.  Unexpected results may arise from  inaccuracies in the declared medications.  **Note: The testing scope of this panel includes these medications:  Oxycodone  **Note: The testing scope of this panel does not include the  following reported medications:  Acitretin  Atorvastatin  Cyclobenzaprine  Lisinopril  Meloxicam  Metoprolol  Multivitamin  Ropinirole (Requip) ==================================================================== For clinical consultation, please call [phone]. ====================================================================     Intrathecal Pump Therapy Assessment  Manufacturer: Medtronic Synchromed Type: Programmable Volume: 40 mL reservoir MRI compatibility: Yes   Drug content:  Primary Medication Class: Opioid Primary Medication:PF-Fentanyl(1,000 mcg/mL) Secondary Medication:PF-Bupivacaine(20.0 mg/mL) Other Medication: see pump readout   Programming:  Type: Simple continuous. See pump readout for details.   Changes:  Medication Change: None at this point Rate Change: No change in rate  Reported side-effects or adverse reactions: None reported  Effectiveness: Described as relatively effective, allowing for increase in activities of daily living (ADL) Clinically meaningful improvement in function (CMIF): Sustained CMIF goals met  Plan: Pump refill today  Pre-op Assessment:  Kendra Nelson is a 59 y.o. (year old), female patient, seen today for interventional treatment. 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 Kendra Nelson has a current medication list which includes the following prescription(s): atorvastatin, cyclobenzaprine, hydroxyzine, lisinopril, ropinirole, [START ON 02/21/2020] oxycodone hcl, [START ON 03/22/2020] oxycodone hcl, [START ON 04/21/2020] oxycodone hcl, pregabalin, and pregabalin. Her primarily concern today is the Back Pain (lower)  Initial Vital Signs:  Pulse/HCG Rate: (!) 46  Temp: (!) 97.2 F (36.2 C) Resp: 16 BP: (!) 173/80 SpO2:    BMI: Estimated body mass index is 29.76 kg/m as calculated from the following:   Height as of this encounter: '5\' 3"'  (1.6 m).   Weight as of this encounter: 168 lb (76.2 kg).  Risk Assessment: Allergies: Reviewed. She is allergic to benadryl [diphenhydramine hcl] and bee venom.  Allergy Precautions: None required Coagulopathies: Reviewed. None identified.  Blood-thinner therapy: None at this  time Active Infection(s): Reviewed. None identified. Ms. Wiltse is afebrile  Site Confirmation: Ms. Vessels was asked to confirm the procedure and laterality before marking the site Procedure checklist: Completed Consent: Before the procedure and under the influence of no sedative(s), amnesic(s), or anxiolytics, the patient was informed of the treatment options, risks and possible complications. To fulfill our ethical and legal obligations, as recommended by the American Medical Association's Code of Ethics, I have informed the patient of my clinical impression; the nature and purpose of the treatment or procedure; the risks, benefits, and possible complications of the intervention; the alternatives, including doing nothing; the risk(s) and benefit(s) of the alternative treatment(s) or procedure(s); and the risk(s) and benefit(s) of doing nothing.  Ms. Gibb was provided with information about the general risks and possible complications associated with most interventional procedures.  These include, but are not limited to: failure to achieve desired goals, infection, bleeding, organ or nerve damage, allergic reactions, paralysis, and/or death.  In addition, she was informed of those risks and possible complications associated to this particular procedure, which include, but are not limited to: damage to the implant; failure to decrease pain; local, systemic, or serious CNS infections, intraspinal abscess with possible cord compression and paralysis, or life-threatening such as meningitis; bleeding; organ damage; nerve injury or damage with subsequent sensory, motor, and/or autonomic system dysfunction, resulting in transient or permanent pain, numbness, and/or weakness of one or several areas of the body; allergic reactions, either minor or major life-threatening, such as anaphylactic or anaphylactoid reactions.  Furthermore, Ms. Keilman was informed of those risks and complications associated with the medications. These include, but are not limited to: allergic reactions (i.e.: anaphylactic or anaphylactoid reactions); endorphine suppression; bradycardia and/or hypotension; water retention and/or peripheral vascular relaxation leading to lower extremity edema and possible stasis ulcers; respiratory depression and/or shortness of breath; decreased metabolic rate leading to weight gain; swelling or edema; medication-induced neural toxicity; particulate matter embolism and blood vessel occlusion with resultant organ, and/or nervous system infarction; and/or intrathecal granuloma formation with possible spinal cord compression and permanent paralysis.  Before refilling the pump Ms. Daw was informed that some of the medications used in the devise may not be FDA approved for such use and therefore it constitutes an off-label use of the medications.  Finally, she was informed that Medicine is not an exact science; therefore, there is also the possibility of unforeseen or unpredictable  risks and/or possible complications that may result in a catastrophic outcome. The patient indicated having understood very clearly. We have given the patient no guarantees and we have made no promises. Enough time was given to the patient to ask questions, all of which were answered to the patient's satisfaction. Ms. Mcquade has indicated that she wanted to continue with the procedure. Attestation: I, the ordering provider, attest that I have discussed with the patient the benefits, risks, side-effects, alternatives, likelihood of achieving goals, and potential problems during recovery for the procedure that I have provided informed consent. Date  Time: 02/20/2020 11:19 AM  Pre-Procedure Preparation:  Monitoring: As per clinic protocol. Respiration, ETCO2, SpO2, BP, heart rate and rhythm monitor placed and checked for adequate function Safety Precautions: Patient was assessed for positional comfort and pressure points before starting the procedure. Time-out: I initiated and conducted the "Time-out" before starting the procedure, as per protocol. The patient was asked to participate by confirming the accuracy of the "Time Out" information. Verification of the correct person, site, and procedure were performed and confirmed by  me, the nursing staff, and the patient. "Time-out" conducted as per Joint Commission's Universal Protocol (UP.01.01.01). Time:    Description of Procedure:          Position: Supine Target Area: Central-port of intrathecal pump. Approach: Anterior, 90 degree angle approach. Area Prepped: Entire Area around the pump implant. DuraPrep (Iodine Povacrylex [0.7% available iodine] and Isopropyl Alcohol, 74% w/w) Safety Precautions: Aspiration looking for blood return was conducted prior to all injections. At no point did we inject any substances, as a needle was being advanced. No attempts were made at seeking any paresthesias. Safe injection practices and needle disposal techniques  used. Medications properly checked for expiration dates. SDV (single dose vial) medications used. Description of the Procedure: Protocol guidelines were followed. Two nurses trained to do implant refills were present during the entire procedure. The refill medication was checked by both healthcare providers as well as the patient. The patient was included in the "Time-out" to verify the medication. The patient was placed in position. The pump was identified. The area was prepped in the usual manner. The sterile template was positioned over the pump, making sure the side-port location matched that of the pump. Both, the pump and the template were held for stability. The needle provided in the Medtronic Kit was then introduced thru the center of the template and into the central port. The pump content was aspirated and discarded volume documented. The new medication was slowly infused into the pump, thru the filter, making sure to avoid overpressure of the device. The needle was then removed and the area cleansed, making sure to leave some of the prepping solution back to take advantage of its long term bactericidal properties. The pump was interrogated and programmed to reflect the correct medication, volume, and dosage. The program was printed and taken to the physician for approval. Once checked and signed by the physician, a copy was provided to the patient and another scanned into the EMR. Vitals:   02/20/20 1112  BP: (!) 173/80  Pulse: (!) 46  Resp: 16  Temp: (!) 97.2 F (36.2 C)  Weight: 168 lb (76.2 kg)  Height: '5\' 3"'  (1.6 m)    Start Time:   hrs. End Time:   hrs. Materials & Medications: Medtronic Refill Kit Medication(s): Please see chart orders for details.  Imaging Guidance:          Type of Imaging Technique: None used Indication(s): N/A Exposure Time: No patient exposure Contrast: None used. Fluoroscopic Guidance: N/A Ultrasound Guidance: N/A Interpretation: N/A  Antibiotic  Prophylaxis:   Anti-infectives (From admission, onward)   None     Indication(s): None identified  Post-operative Assessment:  Post-procedure Vital Signs:  Pulse/HCG Rate: (!) 46  Temp: (!) 97.2 F (36.2 C) Resp: 16 BP: (!) 173/80 SpO2:    EBL: None  Complications: No immediate post-treatment complications observed by team, or reported by patient.  Note: The patient tolerated the entire procedure well. A repeat set of vitals were taken after the procedure and the patient was kept under observation following institutional policy, for this type of procedure. Post-procedural neurological assessment was performed, showing return to baseline, prior to discharge. The patient was provided with post-procedure discharge instructions, including a section on how to identify potential problems. Should any problems arise concerning this procedure, the patient was given instructions to immediately contact us, at any time, without hesitation. In any case, we plan to contact the patient by telephone for a follow-up status report regarding this interventional procedure.  Comments:  No additional relevant information.  Plan of Care  Orders:  Orders Placed This Encounter  Procedures  . PUMP REFILL    Maintain Protocol by having two(2) healthcare providers during procedure and programming.    Scheduling Instructions:     Please refill intrathecal pump today.    Order Specific Question:   Where will this procedure be performed?    Answer:   [facility]  . PUMP REFILL    Whenever possible schedule on a procedure today.    Standing Status:   Future    Standing Expiration Date:   07/19/2020    Scheduling Instructions:     Please schedule intrathecal pump refill based on pump programming. Avoid schedule intervals of more than 120 days (4 months).    Order Specific Question:   Where will this procedure be performed?    Answer:   [facility]  . Informed Consent Details:  Physician/Practitioner Attestation; Transcribe to consent form and obtain patient signature    Provider Attestation: I, Estill Dossie Arbour, MD, (Pain Management Specialist), the physician/practitioner, attest that I have discussed with the patient the benefits, risks, side effects, alternatives, likelihood of achieving goals and potential problems during recovery for the procedure that I have provided informed consent.    Scheduling Instructions:     Procedure: Intrathecal Pump Refill     Attending Physician: Beatriz Chancellor A. Dossie Arbour, MD     Indications: Chronic Pain Syndrome (G89.4)     Transcribe to consent form and obtain patient signature.   Chronic Opioid Analgesic:  Oxycodone IR 69m 4x/day(491mdayof oxycodone) (60MME/day) +  PF-(intrathecal)-Fentanyl (13.4 mcg/hr) MME/day: 60MME/day (oral) + 32.16 MME/day (intrathecal) = 93.16 MME/day (Aprox. 1.16 MME/day/kg).   Medications ordered for procedure: Meds ordered this encounter  Medications  . Oxycodone HCl 10 MG TABS    Sig: Take 1 tablet (10 mg total) by mouth every 6 (six) hours as needed. Max: 4/day. Must last 30 days.    Dispense:  120 tablet    Refill:  0    Chronic Pain: STOP Act (Not applicable) Fill 1 day early if closed on refill date. Do not fill until: 02/21/2020. To last until: 03/22/2020. Avoid benzodiazepines within 8 hours of opioids  . Oxycodone HCl 10 MG TABS    Sig: Take 1 tablet (10 mg total) by mouth every 6 (six) hours as needed. Max: 4/day. Must last 30 days.    Dispense:  120 tablet    Refill:  0    Chronic Pain: STOP Act (Not applicable) Fill 1 day early if closed on refill date. Do not fill until: 03/22/2020. To last until: 04/21/2020. Avoid benzodiazepines within 8 hours of opioids  . Oxycodone HCl 10 MG TABS    Sig: Take 1 tablet (10 mg total) by mouth every 6 (six) hours as needed. Max: 4/day. Must last 30 days.    Dispense:  120 tablet    Refill:  0    Chronic Pain: STOP Act (Not applicable) Fill 1 day  early if closed on refill date. Do not fill until: 04/21/2020. To last until: 05/21/2020. Avoid benzodiazepines within 8 hours of opioids   Medications administered: BrHassan Rowan. Ishida had no medications administered during this visit.  See the medical record for exact dosing, route, and time of administration.  Follow-up plan:   Return for Pump Refill (Max:51m42moin addition, (F2F), (MM).       Interventional management options: Planned, scheduled, and/or pending:   Intrathecal pump refill as  per pump programming.   Under consideration:   Diagnostic left IA knee joint inj  Diagnostic bilateral L2 TFESI  Diagnostic left genicular NB  Possible left genicular nerve RFA  Diagnostic caudal ESI + epidurogram Possible Racz procedure Diagnostic bilateral lumbar facet block Possible bilateral lumbar facet RFA (unlikely due to extensive lumbar fusion from L3-S1)    Therapeutic/palliative (PRN):   Therapeutic/palliative intrathecal pump  management (analysis and refill)       Recent Visits Date Type Provider Dept  11/23/19 Procedure visit Milinda Pointer, MD [facility]  Showing recent visits within past 90 days and meeting all other requirements Today's Visits Date Type Provider Dept  02/20/20 Procedure visit Milinda Pointer, MD [facility]  Showing today's visits and meeting all other requirements Future Appointments No visits were found meeting these conditions. Showing future appointments within next 90 days and meeting all other requirements  Disposition: Discharge home  Discharge (Date  Time): 02/20/2020; 1142 hrs.   Primary Care Physician: System, Pcp Not In Location: [address] [facility] Note by: Kendra Cola, MD Date: 02/20/2020; Time: 1:01 PM  Disclaimer:  Medicine is not an Chief Strategy Officer. The only guarantee in medicine is that nothing is guaranteed. It is important to note that the decision to proceed with this  intervention was based on the information collected from the patient. The Data and conclusions were drawn from the patient's questionnaire, the interview, and the physical examination. Because the information was provided in large part by the patient, it cannot be guaranteed that it has not been purposely or unconsciously manipulated. Every effort has been made to obtain as much relevant data as possible for this evaluation. It is important to note that the conclusions that lead to this procedure are derived in large part from the available data. Always take into account that the treatment will also be dependent on availability of resources and existing treatment guidelines, considered by other Pain Management Practitioners as being common knowledge and practice, at the time of the intervention. For Medico-Legal purposes, it is also important to point out that variation in procedural techniques and pharmacological choices are the acceptable norm. The indications, contraindications, technique, and results of the above procedure should only be interpreted and judged by a Board-Certified Interventional Pain Specialist with extensive familiarity and expertise in the same exact procedure and technique.

## 2020-02-20 NOTE — Patient Instructions (Signed)

## 2020-02-21 ENCOUNTER — Telehealth: Payer: Self-pay

## 2020-02-21 MED FILL — Medication: INTRATHECAL | Qty: 1 | Status: AC

## 2020-02-21 NOTE — Telephone Encounter (Signed)
No answer, left message to callif needed. 

## 2020-02-22 MED FILL — Medication: INTRATHECAL | Qty: 1 | Status: AC

## 2020-04-16 ENCOUNTER — Other Ambulatory Visit: Payer: Self-pay

## 2020-04-16 MED ORDER — PAIN MANAGEMENT IT PUMP REFILL: 1.0000 | Freq: Once | INTRATHECAL | 0 refills | Status: DC

## 2020-04-16 MED ORDER — PAIN MANAGEMENT IT PUMP REFILL: 1.0000 | Freq: Once | INTRATHECAL | 0 refills | Status: AC

## 2020-04-22 ENCOUNTER — Telehealth: Payer: Self-pay | Admitting: *Deleted

## 2020-04-22 NOTE — Telephone Encounter (Signed)
Spoke with patients husband and he called the [facility] and they do not have the oxycodone 10 mg either.  I told them that I would give the messge to Dr Laban Emperor and that we would possibly have to change medications.  Please advise.

## 2020-04-23 NOTE — Telephone Encounter (Signed)
Bubble sent to Dr Laban Emperor to check in box.

## 2020-04-23 NOTE — Telephone Encounter (Signed)
Her husband called at [phone] today to check to see if this was addressed. Please advise

## 2020-04-24 ENCOUNTER — Other Ambulatory Visit: Payer: Self-pay | Admitting: Pain Medicine

## 2020-04-24 DIAGNOSIS — G894 Chronic pain syndrome: Secondary | ICD-10-CM

## 2020-04-24 MED ORDER — MORPHINE SULFATE ER 30 MG PO TBCR: 30.0000 mg | EXTENDED_RELEASE_TABLET | Freq: Two times a day (BID) | ORAL | 0 refills | Status: DC

## 2020-04-24 NOTE — Progress Notes (Signed)
Patient unable to get oxycodone due to national shortage.  Today I have switched her to the MS Contin.  The patient was taking oxycodone IR 10 mg p.o. every 6 hours (60 MME) and will therefore be switched to MS Contin 30 mg p.o. twice daily (60 MME's).

## 2020-04-24 NOTE — Telephone Encounter (Signed)
Voicemail left with patient alerting her that Dr Laban Emperor has called in MS Contin to replace oxycodone Rx's d/t the shortage.  I did tell her that they would further discuss this at her pump refill on 05/16/20.    I have also spoken with Kathlene November, [facility] to cancel any existing Rx for oxycodone.

## 2020-05-16 ENCOUNTER — Ambulatory Visit: Payer: Medicare HMO | Attending: Pain Medicine | Admitting: Pain Medicine

## 2020-05-16 ENCOUNTER — Encounter: Payer: Self-pay | Admitting: Pain Medicine

## 2020-05-16 ENCOUNTER — Other Ambulatory Visit: Payer: Self-pay

## 2020-05-16 VITALS — BP 198/89 | HR 79 | Temp 98.3°F | Resp 18 | Ht 63.0 in | Wt 160.0 lb

## 2020-05-16 DIAGNOSIS — M5136 Other intervertebral disc degeneration, lumbar region: Secondary | ICD-10-CM | POA: Diagnosis not present

## 2020-05-16 DIAGNOSIS — M961 Postlaminectomy syndrome, not elsewhere classified: Secondary | ICD-10-CM

## 2020-05-16 DIAGNOSIS — Z451 Encounter for adjustment and management of infusion pump: Secondary | ICD-10-CM

## 2020-05-16 DIAGNOSIS — M79605 Pain in left leg: Secondary | ICD-10-CM

## 2020-05-16 DIAGNOSIS — M51369 Other intervertebral disc degeneration, lumbar region without mention of lumbar back pain or lower extremity pain: Secondary | ICD-10-CM

## 2020-05-16 DIAGNOSIS — Z9689 Presence of other specified functional implants: Secondary | ICD-10-CM | POA: Diagnosis not present

## 2020-05-16 DIAGNOSIS — F1190 Opioid use, unspecified, uncomplicated: Secondary | ICD-10-CM

## 2020-05-16 DIAGNOSIS — G894 Chronic pain syndrome: Secondary | ICD-10-CM | POA: Diagnosis present

## 2020-05-16 DIAGNOSIS — M5442 Lumbago with sciatica, left side: Secondary | ICD-10-CM

## 2020-05-16 DIAGNOSIS — M5417 Radiculopathy, lumbosacral region: Secondary | ICD-10-CM | POA: Diagnosis not present

## 2020-05-16 DIAGNOSIS — G8929 Other chronic pain: Secondary | ICD-10-CM

## 2020-05-16 DIAGNOSIS — Z978 Presence of other specified devices: Secondary | ICD-10-CM

## 2020-05-16 MED ORDER — MORPHINE SULFATE ER 30 MG PO TBCR: 30.0000 mg | EXTENDED_RELEASE_TABLET | Freq: Two times a day (BID) | ORAL | 0 refills | Status: DC

## 2020-05-16 NOTE — Patient Instructions (Addendum)
____________________________________________________________________________________________  Drug Holidays (Slow)  What is a "Drug Holiday"? Drug Holiday: is the name given to the period of time during which a patient stops taking a medication(s) for the purpose of eliminating tolerance to the drug.  Benefits . Improved effectiveness of opioids. . Decreased opioid dose needed to achieve benefits. . Improved pain with lesser dose.  What is tolerance? Tolerance: is the progressive decreased in effectiveness of a drug due to its repetitive use. With repetitive use, the body gets use to the medication and as a consequence, it loses its effectiveness. This is a common problem seen with opioid pain medications. As a result, a larger dose of the drug is needed to achieve the same effect that used to be obtained with a smaller dose.  How long should a "Drug Holiday" last? You should stay off of the pain medicine for at least 14 consecutive days. (2 weeks)  Should I stop the medicine "cold [place]"? No. You should always coordinate with your Pain Specialist so that he/she can provide you with the correct medication dose to make the transition as smoothly as possible.  How do I stop the medicine? Slowly. You will be instructed to decrease the daily amount of pills that you take by one (1) pill every seven (7) days. This is called a "slow downward taper" of your dose. For example: if you normally take four (4) pills per day, you will be asked to drop this dose to three (3) pills per day for seven (7) days, then to two (2) pills per day for seven (7) days, then to one (1) per day for seven (7) days, and at the end of those last seven (7) days, this is when the "Drug Holiday" would start.   Will I have withdrawals? By doing a "slow downward taper" like this one, it is unlikely that you will experience any significant withdrawal symptoms. Typically, what triggers withdrawals is the sudden stop of a high  dose opioid therapy. Withdrawals can usually be avoided by slowly decreasing the dose over a prolonged period of time. If you do not follow these instructions and decide to stop your medication abruptly, withdrawals may be possible.  What are withdrawals? Withdrawals: refers to the wide range of symptoms that occur after stopping or dramatically reducing opiate drugs after heavy and prolonged use. Withdrawal symptoms do not occur to patients that use low dose opioids, or those who take the medication sporadically. Contrary to benzodiazepine (example: Valium, Xanax, etc.) or alcohol withdrawals ("Delirium Tremens"), opioid withdrawals are not lethal. Withdrawals are the physical manifestation of the body getting rid of the excess receptors.  Expected Symptoms Early symptoms of withdrawal may include: . Agitation . Anxiety . Muscle aches . Increased tearing . Insomnia . Runny nose . Sweating . Yawning  Late symptoms of withdrawal may include: . Abdominal cramping . Diarrhea . Dilated pupils . Goose bumps . Nausea . Vomiting  Will I experience withdrawals? Due to the slow nature of the taper, it is very unlikely that you will experience any.  What is a slow taper? Taper: refers to the gradual decrease in dose.  (Last update: 03/20/2020) ____________________________________________________________________________________________     Opioid Overdose Opioids are drugs that are often used to treat pain. Opioids include illegal drugs, such as heroin, as well as prescription pain medicines, such as codeine, morphine, hydrocodone, oxycodone, and fentanyl. An opioid overdose happens when you take too much of an opioid. An overdose may be intentional or accidental and can  happen with any type of opioid. The effects of an overdose can be mild, dangerous, or even deadly. Opioid overdose is a medical emergency. What are the causes? This condition may be caused by:  Taking too much of an  opioid on purpose.  Taking too much of an opioid by accident.  Using two or more substances that contain opioids at the same time.  Taking an opioid with a substance that affects your heart, breathing, or blood pressure. These include alcohol, tranquilizers, sleeping pills, illegal drugs, and some over-the-counter medicines. This condition may also happen due to an error made by:  A health care provider who prescribes a medicine.  The pharmacist who fills the prescription order. What increases the risk? This condition is more likely in:  Children. They may be attracted to colorful pills. Because of a child's small size, even a small amount of a drug can be dangerous.  Older people. They may be taking many different drugs. Older people may have difficulty reading labels or remembering when they last took their medicine. They may also be more sensitive to the effects of opioids.  People with chronic medical conditions, especially heart, liver, kidney, or neurological diseases.  People who take an opioid for a long period of time.  People who use: ? Illegal drugs. IV heroin is especially dangerous. ? Other substances, including alcohol, while using an opioid.  People who have: ? A history of drug or alcohol abuse. ? Certain mental health conditions. ? A history of previous drug overdoses.  People who take opioids that are not prescribed for them. What are the signs or symptoms? Symptoms of this condition depend on the type of opioid and the amount that was taken. Common symptoms include:  Sleepiness or difficulty waking from sleep.  Decrease in attention.  Confusion.  Slurred speech.  Slowed breathing and a slow pulse (bradycardia).  Nausea and vomiting.  Abnormally small pupils. Signs and symptoms that require emergency treatment include:  Cold, clammy, and pale skin.  Blue lips and fingernails.  Vomiting.  Gurgling sounds in the throat.  A pulse that is very  slow or difficult to detect.  Breathing that is very irregular, slow, noisy, or difficult to detect.  Limp body.  Inability to respond to speech or be awakened from sleep (stupor).  Seizures. How is this diagnosed? This condition is diagnosed based on your symptoms and medical history. It is important to tell your health care provider:  About all of the opioids that you took.  When you took the opioids.  Whether you were drinking alcohol or using marijuana, cocaine, or other drugs. Your health care provider will do a physical exam. This exam may include:  Checking and monitoring your heart rate and rhythm, breathing rate, temperature, and blood pressure (vital signs).  Measuring oxygen levels in your blood.  Checking for abnormally small pupils. You may also have blood tests or urine tests. You may have X-rays if you are having severe breathing problems. How is this treated? This condition requires immediate medical treatment and hospitalization. Treatment is given in the hospital intensive care (ICU) setting. Supporting your vital signs and your breathing is the first step in treating an opioid overdose. Treatment may also include:  Giving salts and minerals (electrolytes) along with fluids through an IV.  Inserting a breathing tube (endotracheal tube) in your airway to help you breathe if you cannot breathe on your own or you are in danger of not being able to breathe on  your own.  Giving oxygen through a small tube under your nose.  Passing a tube through your nose and into your stomach (nasogastric tube, or NG tube) to empty your stomach.  Giving medicines that: ? Increase your blood pressure. ? Relieve nausea and vomiting. ? Relieve abdominal pain and cramping. ? Reverse the effects of the opioid (naloxone).  Monitoring your heart and oxygen levels.  Ongoing counseling and mental health support if you intentionally overdosed or used an illegal drug. Follow these  instructions at home:  Medicines  Take over-the-counter and prescription medicines only as told by your health care provider.  Always ask your health care provider about possible side effects and interactions of any new medicine that you start taking.  Keep a list of all the medicines that you take, including over-the-counter medicines. Bring this list with you to all your medical visits. General instructions  Drink enough fluid to keep your urine pale yellow.  Keep all follow-up visits as told by your health care provider. This is important. How is this prevented?  Read the drug inserts that come with your opioid pain medicines.  Take medicines only as told by your health care provider. Do not take more medicine than you are told. Do not take medicines more frequently than you are told.  Do not drink alcohol or take sedatives when taking opioids.  Do not use illegal or recreational drugs, including cocaine, ecstasy, and marijuana.  Do not take opioid medicines that are not prescribed for you.  Store all medicines in safety containers that are out of the reach of children.  Get help if you are struggling with: ? Alcohol or drug use. ? Depression or another mental health problem. ? Thoughts of hurting yourself or another person.  Keep the phone number of your local poison control center near your phone or in your mobile phone. In the [place] the hotline of the [facility] is (610)557-5935.  If you were prescribed naloxone, make sure you understand how to take it. Contact a health care provider if you:  Need help understanding how to take your pain medicines.  Feel your medicines are too strong.  Are concerned that your pain medicines are not working well for your pain.  Develop new symptoms or side effects when you are taking medicines. Get help right away if:  You or someone else is having symptoms of an opioid overdose. Get help even if you are not  sure.  You have serious thoughts about hurting yourself or others.  You have: ? Chest pain. ? Difficulty breathing. ? A loss of consciousness. These symptoms may represent a serious problem that is an emergency. Do not wait to see if the symptoms will go away. Get medical help right away. Call your local emergency services (911 in the [place] Do not drive yourself to the hospital. If you ever feel like you may hurt yourself or others, or have thoughts about taking your own life, get help right away. You can go to your nearest emergency department or call:  Your local emergency services (911 in the [place]  A suicide crisis helpline, such as the National Suicide Prevention Lifeline at (301)180-4642. This is open 24 hours a day. Summary  Opioids are drugs that are often used to treat pain. Opioids include illegal drugs, such as heroin, as well as prescription pain medicines.  An opioid overdose happens when you take too much of an opioid.  Overdoses can be intentional or accidental.  Opioid overdose is very dangerous. It is a life-threatening emergency.  If you or someone you know is experiencing an opioid overdose, get help right away. This information is not intended to replace advice given to you by your health care provider. Make sure you discuss any questions you have with your health care provider. Document Revised: 08/04/2018 Document Reviewed: 08/04/2018 [employer] Patient Education  2020 [employer].

## 2020-05-16 NOTE — Progress Notes (Signed)
PROVIDER NOTE: Information contained herein reflects review and annotations entered in association with encounter. Interpretation of such information and data should be left to medically-trained personnel. Information provided to patient can be located elsewhere in the medical record under "Patient Instructions". Document created using STT-dictation technology, any transcriptional errors that may result from process are unintentional.    Patient: Kendra Nelson  Service Category: Procedure  Provider: Gaspar Cola, MD  DOB: 1961/02/15  DOS: 05/16/2020  Location: [facility]  MRN: [id number]  Setting: Ambulatory - outpatient  Referring Provider: No ref. provider found  Type: Established Patient  Specialty: Interventional Pain Management  PCP: Pcp, No   Primary Reason for Visit: Interventional Pain Management Treatment. CC: Back Pain (low)  Procedure:          Intrathecal Drug Delivery System (IDDS):  Type: Reservoir Refill [phone])       Region: Abdominal Laterality: Left  Type of Pump: Medtronic Synchromed II Delivery Route: Intrathecal Type of Pain Treated: Neuropathic/Nociceptive Primary Medication Class: Opioid/opiate  Medication, Concentration, Infusion Program, & Delivery Rate: Please see scanned programming printout.   Indications: 1. 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 DDD (degenerative disc disease), lumbar   4. Chronic low back pain (Primary Area of Pain) (Bilateral) (R>L)   5. Chronic lower extremity pain (Secondary Area of Pain) (Bilateral) (R>L)   6. Chronic lumbosacral radiculopathy (L5) (Left)   7. Presence of intrathecal pump   8. Adjustment and management of infusion pump   9. Opiate use (93.16 MME/day)    Pain Assessment: Self-Reported Pain Score: 4 /10             Reported level is compatible with observation.        Pharmacotherapy Assessment  Analgesic: Oxycodone IR 21m  4x/day(460mdayof oxycodone) (60MME/day) +  PF-(intrathecal)-Fentanyl (13.4 mcg/hr) MME/day: 60MME/day (oral) + 32.16 MME/day (intrathecal) = 93.16 MME/day (Aprox. 1.16 MME/day/kg).   Monitoring: [place] PMP: PDMP reviewed during this encounter.       Pharmacotherapy: No side-effects or adverse reactions reported. Compliance: No problems identified. Effectiveness: Clinically acceptable. Plan: Refer to "POC".  UDS:  Summary  Date Value Ref Range Status  03/23/2019 Note  Final    Comment:    ==================================================================== [employer] Select 13 (MW) ==================================================================== Test                             Result       Flag       Units Drug Present and Declared for Prescription Verification   Oxycodone                      4297         EXPECTED   ng/mg creat   Oxymorphone                    1874         EXPECTED   ng/mg creat   Noroxycodone                   3680         EXPECTED   ng/mg creat   Noroxymorphone                 467          EXPECTED   ng/mg creat    Sources of oxycodone  are scheduled prescription medications.    Oxymorphone, noroxycodone, and noroxymorphone are expected    metabolites of oxycodone. Oxymorphone is also available as a    scheduled prescription medication. Drug Present not Declared for Prescription Verification   Fentanyl                       38           UNEXPECTED ng/mg creat   Norfentanyl                    57           UNEXPECTED ng/mg creat    Source of fentanyl is a scheduled prescription medication, including    IV, patch, and transmucosal formulations. Norfentanyl is an expected    metabolite of fentanyl. ==================================================================== Test                      Result    Flag   Units      Ref Range   Creatinine              107              mg/dL       >=20 ==================================================================== Declared Medications:  The flagging and interpretation on this report are based on the  following declared medications.  Unexpected results may arise from  inaccuracies in the declared medications.  **Note: The testing scope of this panel includes these medications:  Oxycodone  **Note: The testing scope of this panel does not include the  following reported medications:  Acitretin  Atorvastatin  Cyclobenzaprine  Lisinopril  Meloxicam  Metoprolol  Multivitamin  Ropinirole (Requip) ==================================================================== For clinical consultation, please call [phone]. ====================================================================     Intrathecal Pump Therapy Assessment  Manufacturer: Medtronic Synchromed Type: Programmable Volume: 40 mL reservoir MRI compatibility: Yes   Drug content:  Primary Medication Class: Opioid Primary Medication:PF-Fentanyl(1,000 mcg/mL) Secondary Medication:PF-Bupivacaine(20.0 mg/mL) Other Medication: see pump readout   Programming:  Type: Simple continuous. See pump readout for details.   Changes:  Medication Change: None at this point Rate Change: No change in rate  Reported side-effects or adverse reactions: None reported  Effectiveness: Described as relatively effective, allowing for increase in activities of daily living (ADL) Clinically meaningful improvement in function (CMIF): Sustained CMIF goals met  Plan: Pump refill today  Pre-op Assessment:  Kendra Nelson is a 59 y.o. (year old), female patient, seen today for interventional treatment. 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 Kendra Nelson has a current medication list which includes the following prescription(s): AMBULATORY NON FORMULARY MEDICATION, atorvastatin, lisinopril, [START ON 05/24/2020] morphine, [START ON 06/23/2020] morphine, [START ON 07/23/2020] morphine, and ropinirole. Her primarily concern today is the Back Pain (low)  Initial Vital Signs:  Pulse/HCG Rate: 79  Temp: 98.3 F (36.8 C) Resp: 18 BP: (!) 198/89 SpO2: 98 %  BMI: Estimated body mass index is 28.34 kg/m as calculated from the following:   Height as of this encounter: _0  (1.6 m).   Weight as of this encounter: 160 lb (72.6 kg).  Risk Assessment: Allergies: Reviewed. She is allergic to benadryl [diphenhydramine hcl] and bee venom.  Allergy Precautions: None required Coagulopathies: Reviewed. None identified.  Blood-thinner therapy:  None at this time Active Infection(s): Reviewed. None identified. Ms. Guerrera is afebrile  Site Confirmation: Ms. Lirette was asked to confirm the procedure and laterality before marking the site Procedure checklist: Completed Consent: Before the procedure and under the influence of no sedative(s), amnesic(s), or anxiolytics, the patient was informed of the treatment options, risks and possible complications. To fulfill our ethical and legal obligations, as recommended by the American Medical Association's Code of Ethics, I have informed the patient of my clinical impression; the nature and purpose of the treatment or procedure; the risks, benefits, and possible complications of the intervention; the alternatives, including doing nothing; the risk(s) and benefit(s) of the alternative treatment(s) or procedure(s); and the risk(s) and benefit(s) of doing nothing.  Ms. Dodds was provided with information about the general risks and possible complications associated with most interventional procedures. These include, but are not limited  to: failure to achieve desired goals, infection, bleeding, organ or nerve damage, allergic reactions, paralysis, and/or death.  In addition, she was informed of those risks and possible complications associated to this particular procedure, which include, but are not limited to: damage to the implant; failure to decrease pain; local, systemic, or serious CNS infections, intraspinal abscess with possible cord compression and paralysis, or life-threatening such as meningitis; bleeding; organ damage; nerve injury or damage with subsequent sensory, motor, and/or autonomic system dysfunction, resulting in transient or permanent pain, numbness, and/or weakness of one or several areas of the body; allergic reactions, either minor or major life-threatening, such as anaphylactic or anaphylactoid reactions.  Furthermore, Ms. Appleton was informed of those risks and complications associated with the medications. These include, but are not limited to: allergic reactions (i.e.: anaphylactic or anaphylactoid reactions); endorphine suppression; bradycardia and/or hypotension; water retention and/or peripheral vascular relaxation leading to lower extremity edema and possible stasis ulcers; respiratory depression and/or shortness of breath; decreased metabolic rate leading to weight gain; swelling or edema; medication-induced neural toxicity; particulate matter embolism and blood vessel occlusion with resultant organ, and/or nervous system infarction; and/or intrathecal granuloma formation with possible spinal cord compression and permanent paralysis.  Before refilling the pump Ms. Metzer was informed that some of the medications used in the devise may not be FDA approved for such use and therefore it constitutes an off-label use of the medications.  Finally, she was informed that Medicine is not an exact science; therefore, there is also the possibility of unforeseen or unpredictable risks and/or possible complications  that may result in a catastrophic outcome. The patient indicated having understood very clearly. We have given the patient no guarantees and we have made no promises. Enough time was given to the patient to ask questions, all of which were answered to the patient's satisfaction. Ms. Snook has indicated that she wanted to continue with the procedure. Attestation: I, the ordering provider, attest that I have discussed with the patient the benefits, risks, side-effects, alternatives, likelihood of achieving goals, and potential problems during recovery for the procedure that I have provided informed consent. Date  Time: 05/16/2020 10:12 AM  Pre-Procedure Preparation:  Monitoring: As per clinic protocol. Respiration, ETCO2, SpO2, BP, heart rate and rhythm monitor placed and checked for adequate function Safety Precautions: Patient was assessed for positional comfort and pressure points before starting the procedure. Time-out: I initiated and conducted the "Time-out" before starting the procedure, as per protocol. The patient was asked to participate by confirming the accuracy of the "Time Out" information. Verification of the correct person, site, and procedure were performed  and confirmed by me, the nursing staff, and the patient. "Time-out" conducted as per Joint Commission's Universal Protocol (UP.01.01.01). Time: 1029  Description of Procedure:          Position: Supine Target Area: Central-port of intrathecal pump. Approach: Anterior, 90 degree angle approach. Area Prepped: Entire Area around the pump implant. DuraPrep (Iodine Povacrylex [0.7% available iodine] and Isopropyl Alcohol, 74% w/w) Safety Precautions: Aspiration looking for blood return was conducted prior to all injections. At no point did we inject any substances, as a needle was being advanced. No attempts were made at seeking any paresthesias. Safe injection practices and needle disposal techniques used. Medications properly  checked for expiration dates. SDV (single dose vial) medications used. Description of the Procedure: Protocol guidelines were followed. Two nurses trained to do implant refills were present during the entire procedure. The refill medication was checked by both healthcare providers as well as the patient. The patient was included in the "Time-out" to verify the medication. The patient was placed in position. The pump was identified. The area was prepped in the usual manner. The sterile template was positioned over the pump, making sure the side-port location matched that of the pump. Both, the pump and the template were held for stability. The needle provided in the Medtronic Kit was then introduced thru the center of the template and into the central port. The pump content was aspirated and discarded volume documented. The new medication was slowly infused into the pump, thru the filter, making sure to avoid overpressure of the device. The needle was then removed and the area cleansed, making sure to leave some of the prepping solution back to take advantage of its long term bactericidal properties. The pump was interrogated and programmed to reflect the correct medication, volume, and dosage. The program was printed and taken to the physician for approval. Once checked and signed by the physician, a copy was provided to the patient and another scanned into the EMR. Vitals:   05/16/20 1011 05/16/20 1012  BP:  (!) 198/89  Pulse:  79  Resp:  18  Temp:  98.3 F (36.8 C)  SpO2:  98%  Weight: 160 lb (72.6 kg)   Height: _0  (1.6 m)     Start Time: 1029 hrs. End Time: 1040 hrs. Materials & Medications: Medtronic Refill Kit Medication(s): Please see chart orders for details.  Imaging Guidance:          Type of Imaging Technique: None used Indication(s): N/A Exposure Time: No patient exposure Contrast: None used. Fluoroscopic Guidance: N/A Ultrasound Guidance: N/A Interpretation: N/A  Antibiotic  Prophylaxis:   Anti-infectives (From admission, onward)   None     Indication(s): None identified  Post-operative Assessment:  Post-procedure Vital Signs:  Pulse/HCG Rate: 79  Temp: 98.3 F (36.8 C) Resp: 18 BP: (!) 198/89 SpO2: 98 %  EBL: None  Complications: No immediate post-treatment complications observed by team, or reported by patient.  Note: The patient tolerated the entire procedure well. A repeat set of vitals were taken after the procedure and the patient was kept under observation following institutional policy, for this type of procedure. Post-procedural neurological assessment was performed, showing return to baseline, prior to discharge. The patient was provided with post-procedure discharge instructions, including a section on how to identify potential problems. Should any problems arise concerning this procedure, the patient was given instructions to immediately contact us, at any time, without hesitation. In any case, we plan to contact the patient by telephone for  a follow-up status report regarding this interventional procedure.  Comments:  No additional relevant information.  Plan of Care  Orders:  Orders Placed This Encounter  Procedures  . PUMP REFILL    Maintain Protocol by having two(2) healthcare providers during procedure and programming.    Scheduling Instructions:     Please refill intrathecal pump today.    Order Specific Question:   Where will this procedure be performed?    Answer:   [facility]  . PUMP REFILL    Whenever possible schedule on a procedure today.    Standing Status:   Future    Standing Expiration Date:   10/13/2020    Scheduling Instructions:     Please schedule intrathecal pump refill based on pump programming. Avoid schedule intervals of more than 120 days (4 months).    Order Specific Question:   Where will this procedure be performed?    Answer:   [facility]  . Informed Consent Details:  Physician/Practitioner Attestation; Transcribe to consent form and obtain patient signature    Provider Attestation: I, [place] Dossie Arbour, MD, (Pain Management Specialist), the physician/practitioner, attest that I have discussed with the patient the benefits, risks, side effects, alternatives, likelihood of achieving goals and potential problems during recovery for the procedure that I have provided informed consent.    Scheduling Instructions:     Procedure: Intrathecal Pump Refill     Attending Physician: Beatriz Chancellor A. Dossie Arbour, MD     Indications: Chronic Pain Syndrome (G89.4)     Transcribe to consent form and obtain patient signature.   Chronic Opioid Analgesic:  Oxycodone IR 22m 4x/day(455mdayof oxycodone) (60MME/day) +  PF-(intrathecal)-Fentanyl (13.4 mcg/hr) MME/day: 60MME/day (oral) + 32.16 MME/day (intrathecal) = 93.16 MME/day (Aprox. 1.16 MME/day/kg).   Medications ordered for procedure: Meds ordered this encounter  Medications  . morphine (MS CONTIN) 30 MG 12 hr tablet    Sig: Take 1 tablet (30 mg total) by mouth every 12 (twelve) hours. Must last 30 days. Do not break tablet    Dispense:  60 tablet    Refill:  0    Chronic Pain: STOP Act (Not applicable) Fill 1 day early if closed on refill date. Avoid benzodiazepines within 8 hours of opioids  . morphine (MS CONTIN) 30 MG 12 hr tablet    Sig: Take 1 tablet (30 mg total) by mouth every 12 (twelve) hours. Must last 30 days. Do not break tablet    Dispense:  60 tablet    Refill:  0    Chronic Pain: STOP Act (Not applicable) Fill 1 day early if closed on refill date. Avoid benzodiazepines within 8 hours of opioids  . morphine (MS CONTIN) 30 MG 12 hr tablet    Sig: Take 1 tablet (30 mg total) by mouth every 12 (twelve) hours. Must last 30 days. Do not break tablet    Dispense:  60 tablet    Refill:  0    Chronic Pain: STOP Act (Not applicable) Fill 1 day early if closed on refill date. Avoid benzodiazepines within 8  hours of opioids   Medications administered: BrHassan Rowan. Rae had no medications administered during this visit.  See the medical record for exact dosing, route, and time of administration.  Follow-up plan:   Return for Pump Refill (Max:17m61mo      Interventional management options: Planned, scheduled, and/or pending:   Intrathecal pump refill as per pump programming.   Under consideration:   Diagnostic left IA knee  joint inj  Diagnostic bilateral L2 TFESI  Diagnostic left genicular NB  Possible left genicular nerve RFA  Diagnostic caudal ESI + epidurogram Possible Racz procedure Diagnostic bilateral lumbar facet block Possible bilateral lumbar facet RFA (unlikely due to extensive lumbar fusion from L3-S1)    Therapeutic/palliative (PRN):   Therapeutic/palliative intrathecal pump  management (analysis and refill)        Recent Visits Date Type Provider Dept  02/20/20 Procedure visit Milinda Pointer, MD [facility]  Showing recent visits within past 90 days and meeting all other requirements Today's Visits Date Type Provider Dept  05/16/20 Procedure visit Milinda Pointer, MD [facility]  Showing today's visits and meeting all other requirements Future Appointments No visits were found meeting these conditions. Showing future appointments within next 90 days and meeting all other requirements  Disposition: Discharge home  Discharge (Date  Time): 05/16/2020; 1055 hrs.   Primary Care Physician: Pcp, No Location: [facility] Note by: Gaspar Cola, MD Date: 05/16/2020; Time: 11:09 AM  Disclaimer:  Medicine is not an Chief Strategy Officer. The only guarantee in medicine is that nothing is guaranteed. It is important to note that the decision to proceed with this intervention was based on the information collected from the patient. The Data and conclusions were drawn from the patient's questionnaire, the interview, and  the physical examination. Because the information was provided in large part by the patient, it cannot be guaranteed that it has not been purposely or unconsciously manipulated. Every effort has been made to obtain as much relevant data as possible for this evaluation. It is important to note that the conclusions that lead to this procedure are derived in large part from the available data. Always take into account that the treatment will also be dependent on availability of resources and existing treatment guidelines, considered by other Pain Management Practitioners as being common knowledge and practice, at the time of the intervention. For Medico-Legal purposes, it is also important to point out that variation in procedural techniques and pharmacological choices are the acceptable norm. The indications, contraindications, technique, and results of the above procedure should only be interpreted and judged by a Board-Certified Interventional Pain Specialist with extensive familiarity and expertise in the same exact procedure and technique.

## 2020-05-16 NOTE — Progress Notes (Signed)
Nursing Pain Medication Assessment:  Safety precautions to be maintained throughout the outpatient stay will include: orient to surroundings, keep bed in low position, maintain call bell within reach at all times, provide assistance with transfer out of bed and ambulation.  Medication Inspection Compliance: Pill count conducted under aseptic conditions, in front of the patient. Neither the pills nor the bottle was removed from the patient's sight at any time. Once count was completed pills were immediately returned to the patient in their original bottle.  Medication: Morphine ER (MSContin) Pill/Patch Count: 17 of 60 pills remain Pill/Patch Appearance: Markings consistent with prescribed medication Bottle Appearance: Standard pharmacy container. Clearly labeled. Filled Date: 08 / 25 / 2021 Last Medication intake:  Yesterday

## 2020-05-17 ENCOUNTER — Telehealth: Payer: Self-pay

## 2020-05-17 NOTE — Telephone Encounter (Signed)
Post pump refill follow up phone call.  Patient states she is doing good,.

## 2020-05-21 ENCOUNTER — Encounter: Payer: Medicare HMO | Admitting: Pain Medicine

## 2020-05-29 MED FILL — Medication: INTRATHECAL | Qty: 1 | Status: AC

## 2020-06-12 ENCOUNTER — Encounter (HOSPITAL_COMMUNITY): Payer: Self-pay | Admitting: Emergency Medicine

## 2020-06-12 ENCOUNTER — Emergency Department (HOSPITAL_COMMUNITY): Payer: Medicare HMO

## 2020-06-12 ENCOUNTER — Emergency Department (HOSPITAL_COMMUNITY)
Admission: EM | Admit: 2020-06-12 | Discharge: 2020-06-12 | Disposition: A | Discharge status: Home / Self Care | Payer: Medicare HMO | Attending: Emergency Medicine | Admitting: Emergency Medicine

## 2020-06-12 DIAGNOSIS — R519 Headache, unspecified: Secondary | ICD-10-CM | POA: Diagnosis not present

## 2020-06-12 DIAGNOSIS — Z96652 Presence of left artificial knee joint: Secondary | ICD-10-CM | POA: Insufficient documentation

## 2020-06-12 DIAGNOSIS — J019 Acute sinusitis, unspecified: Secondary | ICD-10-CM | POA: Diagnosis not present

## 2020-06-12 DIAGNOSIS — Z87891 Personal history of nicotine dependence: Secondary | ICD-10-CM | POA: Diagnosis not present

## 2020-06-12 DIAGNOSIS — Z79891 Long term (current) use of opiate analgesic: Secondary | ICD-10-CM | POA: Diagnosis not present

## 2020-06-12 DIAGNOSIS — R9431 Abnormal electrocardiogram [ECG] [EKG]: Secondary | ICD-10-CM | POA: Insufficient documentation

## 2020-06-12 DIAGNOSIS — I10 Essential (primary) hypertension: Secondary | ICD-10-CM | POA: Insufficient documentation

## 2020-06-12 DIAGNOSIS — Z79899 Other long term (current) drug therapy: Secondary | ICD-10-CM | POA: Diagnosis not present

## 2020-06-12 LAB — CBC
HCT: 48.3 % — ABNORMAL HIGH (ref 36.0–46.0)
Hemoglobin: 15.8 g/dL — ABNORMAL HIGH (ref 12.0–15.0)
MCH: 30.1 pg (ref 26.0–34.0)
MCHC: 32.7 g/dL (ref 30.0–36.0)
MCV: 92 fL (ref 80.0–100.0)
Platelets: 218 10*3/uL (ref 150–400)
RBC: 5.25 MIL/uL — ABNORMAL HIGH (ref 3.87–5.11)
RDW: 13 % (ref 11.5–15.5)
WBC: 10 10*3/uL (ref 4.0–10.5)
nRBC: 0 % (ref 0.0–0.2)

## 2020-06-12 LAB — I-STAT BETA HCG BLOOD, ED (MC, WL, AP ONLY): I-stat hCG, quantitative: <5 m[IU]/mL (ref <5)

## 2020-06-12 LAB — BASIC METABOLIC PANEL
Anion gap: 13 (ref 5–15)
BUN: 11 mg/dL (ref 6–20)
CO2: 24 mmol/L (ref 22–32)
Calcium: 9.5 mg/dL (ref 8.9–10.3)
Chloride: 104 mmol/L (ref 98–111)
Creatinine, Ser: 0.79 mg/dL (ref 0.44–1.00)
GFR, Estimated: >60 mL/min (ref >60)
Glucose, Bld: 125 mg/dL — ABNORMAL HIGH (ref 70–99)
Potassium: 4 mmol/L (ref 3.5–5.1)
Sodium: 141 mmol/L (ref 135–145)

## 2020-06-12 LAB — TROPONIN I (HIGH SENSITIVITY)
Troponin I (High Sensitivity): 11 ng/L (ref <18)
Troponin I (High Sensitivity): 14 ng/L (ref <18)

## 2020-06-12 MED ORDER — AMOXICILLIN-POT CLAVULANATE 875-125 MG PO TABS: 1.0000 | ORAL_TABLET | Freq: Two times a day (BID) | ORAL | 0 refills | Status: DC

## 2020-06-12 MED ORDER — ATENOLOL 25 MG PO TABS
12.5000 mg | ORAL_TABLET | Freq: Once | ORAL | Status: AC
  Administered 2020-06-12: 12.5 mg via ORAL
  Filled 2020-06-12: qty 1

## 2020-06-12 MED ORDER — ACETAMINOPHEN 500 MG PO TABS
1000.0000 mg | ORAL_TABLET | Freq: Once | ORAL | Status: AC
  Administered 2020-06-12: 1000 mg via ORAL
  Filled 2020-06-12: qty 2

## 2020-06-12 MED ORDER — LISINOPRIL 20 MG PO TABS: 40.0000 mg | ORAL_TABLET | ORAL | 0 refills | Status: DC

## 2020-06-12 NOTE — ED Triage Notes (Signed)
Pt reports went to pcp for what she thinks is a sinus infection yesterday. Had ekg done and was found to be in bigeminy. States she was supposed to come on to ED yesterday but had to get her kids taken care of. Denies chest pain or sob.

## 2020-06-12 NOTE — Discharge Instructions (Signed)
Please follow up with cardiologist outpatient for further evaluation of your abnormal EKG.  Discuss if you need additional blood pressure medications for better management of your high blood pressure.  Take Augmentin as prescribed for sinusitis.  Take over the counter tylenol for headache.  Avoid Sudafed as it can increase your blood pressure.  Return to the ER if you have any concerns.

## 2020-06-12 NOTE — ED Provider Notes (Signed)
[facility] EMERGENCY DEPARTMENT Provider Note   CSN: [id number] Arrival date & time: 06/12/20  1033     History Chief Complaint  Patient presents with  . Abnormal ECG    Kendra Nelson is a 59 y.o. female.  The history is provided by the patient. No language interpreter was used.     59 year old female significant history of hypertension, hyperlipidemia, neuromuscular disorder, anxiety, presenting for evaluation of abnormal EKG. patient report for more than 2 weeks she has had sinus pressure, congestion, and sensation of face fullness. Symptoms moderate in severity but has become progressively worse. For the past 3 days she also endorsed  sharp throbbing headache worse with head movement. She has been taking multiple over-the-counter medication including Sudafed, NyQuil, and Vicks vapor without adequate relief. She does not complain of any fever, diplopia, confusion, neck stiffness, chest pain trouble breathing focal numbness or weakness. She denies any vision changes. She ran out of her blood pressure medication today. She was seen by her PCP yesterday for complaint. At that time, an EKG was obtained and patient was told to come to the ER for further evaluation as her EKG is abnormal. She has family activities that she has to attend for and decided to come here today. She has been vaccinated with for COVID-19.   Past Medical History:  Diagnosis Date  . Anxiety    off of valium- stress  . Arthritis    left knee- rhumatoid- hasnt seen specialist   . Chronic lower back pain   . Complication of anesthesia    WOKE DURING BACK SURGERY ; urinary retention 12/20/2017  . Hyperlipidemia   . Hypertension   . Migraine    "when seasons change" (12/20/2017)  . Neuromuscular disorder (HCC)   . Restless leg syndrome   . Seasonal allergies   . Skin abnormalities   . Wears glasses     Patient Active Problem List   Diagnosis Date Noted  . Weight gain 03/13/2019  . End  of battery life of intrathecal infusion pump 12/13/2018  . Primary osteoarthritis of left knee 12/20/2017  . Degenerative arthritis of left knee 12/16/2017  . 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 Effusion of knee (Left) 09/29/2017  . DDD (degenerative disc disease), lumbar 09/29/2017  . Lumbar intervertebral disc protrusion (L2-3) 09/29/2017  . Restless legs syndrome 07/11/2017  . Lumbar facet syndrome (Bilateral) (R>L) 06/16/2017  . Drug-related hair loss 05/31/2017  . Screening for cervical cancer 05/30/2017  . Neurogenic pain 05/26/2017  . Neuropathic pain 05/26/2017  . Chronic musculoskeletal pain 05/26/2017  . Presence of intrathecal pump 05/25/2017  . Adjustment and management of infusion pump 05/25/2017  . Disorder of skeletal system 05/25/2017  . Chronic pain syndrome 05/25/2017  . Problems influencing health status 05/25/2017  . 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 Chronic low back pain (Primary Area of Pain) (Bilateral) (R>L) 05/25/2017  . Chronic lower extremity pain (Secondary Area of Pain) (Bilateral) (R>L) 05/25/2017  . Long term prescription opiate use 05/25/2017  . Opiate use (93.16 MME/day) 05/25/2017  . Cigarette nicotine dependence without complication 11/24/2016  . Psoriasis 11/24/2016  . Need for influenza vaccination 08/26/2016  . 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  . Pure hypercholesterolemia 02/06/2016  . Need for hepatitis C screening test 02/03/2016  . Palpitations 02/03/2016  . Pharmacologic therapy 02/03/2016  . Chronic knee pain (Left) 01/01/2016  . Adjustment disorder with anxiety 04/02/2015  . Chronic lumbosacral radiculopathy (  L5) (Left) 04/02/2015  . Primary insomnia 02/18/2015  . Chronic back pain greater than 3 months duration 11/12/2014  . Agoraphobia with panic disorder 05/23/2014  . Muscle spasm of back 09/11/2013  . Benign essential hypertension 11/06/2011  . Unspecified  constipation 10/05/2007  . Abnormal weight gain 10/05/2007  . Opioid-induced constipation (OIC) 02/10/2007    Past Surgical History:  Procedure Laterality Date  . BACK SURGERY     8-9 SURGERIES; lower back  . COLONOSCOPY    . INTRATHECAL PUMP REVISION N/A 05/09/2019   Procedure: Intrathecal pump change;  Surgeon: Odette Fraction, MD;  Location: [facility] OR;  Service: Neurosurgery;  Laterality: N/A;  Intrathecal pump change  . JOINT REPLACEMENT    . PAIN PUMP IMPLANTATION  05/10/2012   Procedure: PAIN PUMP INSERTION;  Surgeon: Maeola Harman, MD;  Location: [facility];  Service: Neurosurgery;  Laterality: N/A;  Pump replacement  . SPINAL CORD STIMULATOR BATTERY EXCHANGE N/A 06/09/2016   Procedure: IMPLANTABLE PULSE GENERATOR REPLACEMENT WITH RECHARGEABLE BATTERY;  Surgeon: Maeola Harman, MD;  Location: [facility] OR;  Service: Neurosurgery;  Laterality: N/A;  . TOTAL KNEE ARTHROPLASTY Left 12/20/2017  . TOTAL KNEE ARTHROPLASTY Left 12/20/2017   Procedure: TOTAL KNEE ARTHROPLASTY;  Surgeon: Gean Birchwood, MD;  Location: [facility] OR;  Service: Orthopedics;  Laterality: Left;  Marland Kitchen VAGINAL HYSTERECTOMY       OB History   No obstetric history on file.     Family History  Problem Relation Age of Onset  . Aortic aneurysm Mother   . Diabetes Maternal Grandmother     Social History   Tobacco Use  . Smoking status: Former Smoker    Packs/day: 1.00    Years: 30.00    Pack years: 30.00    Types: Cigarettes    Quit date: 11/12/2018    Years since quitting: 1.5  . Smokeless tobacco: Never Used  Vaping Use  . Vaping Use: Former  Substance Use Topics  . Alcohol use: No  . Drug use: Yes    Types: Oxycodone    Home Medications Prior to Admission medications   Medication Sig Start Date End Date Taking? Authorizing Provider  AMBULATORY NON FORMULARY MEDICATION Medication Name:  Intrathecal pump Fentanyl 1,000.0 mcg/ml Bupivacaine 20.0 mg/ml 40 ml pump Rate 357.2 mcg/day    [provider]    atorvastatin (LIPITOR) 20 MG tablet Take 20 mg by mouth every morning.  04/29/16   [provider]  lisinopril (PRINIVIL,ZESTRIL) 20 MG tablet Take 40 mg by mouth every morning.  05/27/16   [provider]  morphine (MS CONTIN) 30 MG 12 hr tablet Take 1 tablet (30 mg total) by mouth every 12 (twelve) hours. Must last 30 days. Do not break tablet 05/24/20 06/23/20  Delano Metz, MD  morphine (MS CONTIN) 30 MG 12 hr tablet Take 1 tablet (30 mg total) by mouth every 12 (twelve) hours. Must last 30 days. Do not break tablet 06/23/20 07/23/20  Delano Metz, MD  morphine (MS CONTIN) 30 MG 12 hr tablet Take 1 tablet (30 mg total) by mouth every 12 (twelve) hours. Must last 30 days. Do not break tablet 07/23/20 08/22/20  Delano Metz, MD  rOPINIRole (REQUIP) 0.5 MG tablet Take 1 mg by mouth at bedtime. Restless legs 10/22/17   [provider]    Allergies    Benadryl [diphenhydramine hcl] and Bee venom  Review of Systems   Review of Systems  All other systems reviewed and are negative.   Physical Exam Updated Vital  Signs BP (!) 207/85 (BP Location: Right Arm)   Pulse (!) 41   Temp 98.7 F (37.1 C)   Resp 18   SpO2 94%   Physical Exam Vitals and nursing note reviewed.  Constitutional:      General: She is not in acute distress.    Appearance: She is well-developed.  HENT:     Head: Normocephalic and atraumatic.     Comments: Tenderness to percussion of the frontal and maxillary sinus    Right Ear: Tympanic membrane normal.     Left Ear: Tympanic membrane normal.     Nose: Nose normal.     Mouth/Throat:     Mouth: Mucous membranes are moist.     Pharynx: No oropharyngeal exudate or posterior oropharyngeal erythema.  Eyes:     Extraocular Movements: Extraocular movements intact.     Conjunctiva/sclera: Conjunctivae normal.     Pupils: Pupils are equal, round, and reactive to light.  Cardiovascular:     Rate and Rhythm: Rhythm irregular.      Pulses: Normal pulses.     Heart sounds: Normal heart sounds.  Pulmonary:     Effort: Pulmonary effort is normal.     Breath sounds: Normal breath sounds.  Abdominal:     Palpations: Abdomen is soft.     Tenderness: There is no abdominal tenderness.  Musculoskeletal:     Cervical back: Neck supple.  Skin:    Findings: No rash.  Neurological:     Mental Status: She is alert and oriented to person, place, and time.     GCS: GCS eye subscore is 4. GCS verbal subscore is 5. GCS motor subscore is 6.     Cranial Nerves: Cranial nerves are intact.     Sensory: Sensation is intact.     Motor: Motor function is intact.     ED Results / Procedures / Treatments   Labs (all labs ordered are listed, but only abnormal results are displayed) Labs Reviewed  BASIC METABOLIC PANEL - Abnormal; Notable for the following components:      Result Value   Glucose, Bld 125 (*)    All other components within normal limits  CBC - Abnormal; Notable for the following components:   RBC 5.25 (*)    Hemoglobin 15.8 (*)    HCT 48.3 (*)    All other components within normal limits  I-STAT BETA HCG BLOOD, ED ([facility], [facility], [facility] ONLY)  TROPONIN I (HIGH SENSITIVITY)  TROPONIN I (HIGH SENSITIVITY)    EKG EKG Interpretation  Date/Time:  Wednesday June 12 2020 11:05:20 EDT Ventricular Rate:  87 PR Interval:  126 QRS Duration: 76 QT Interval:  410 QTC Calculation: 493 R Axis:   45 Text Interpretation: Sinus rhythm with frequent Premature ventricular complexes Biatrial enlargement Nonspecific ST abnormality Prolonged QT Abnormal ECG No significant change since last tracing Confirmed by Susy Frizzle [phone]) on 06/12/2020 12:16:36 PM   Radiology DG Chest 2 View  Result Date: 06/12/2020 CLINICAL DATA:  Low heart rate, bigeminy. EXAM: CHEST - 2 VIEW COMPARISON:  12/09/2017 chest radiograph and prior. FINDINGS: Bibasilar atelectasis. No pneumothorax or pleural effusion. Mild cardiomegaly. No acute osseous  abnormality. Partially imaged neurostimulator lead and posterior spinal fixation hardware. IMPRESSION: Bibasilar atelectasis.  No acute airspace disease. Mild cardiomegaly. Electronically Signed   By: Stana Bunting M.D.   On: 06/12/2020 11:39    Procedures Procedures (including critical care time)  Medications Ordered in ED Medications  atenolol (TENORMIN) tablet 12.5 mg (12.5 mg  Oral Given 06/12/20 1426)  acetaminophen (TYLENOL) tablet 1,000 mg (1,000 mg Oral Given 06/12/20 1425)    ED Course  I have reviewed the triage vital signs and the nursing notes.  Pertinent labs & imaging results that were available during my care of the patient were reviewed by me and considered in my medical decision making (see chart for details).    MDM Rules/Calculators/A&P                          BP (!) 156/97   Pulse 77   Temp 98.7 F (37.1 C)   Resp 20   Ht 5\' 3"  (1.6 m)   Wt 81.6 kg   SpO2 96%   BMI 31.89 kg/m   Final Clinical Impression(s) / ED Diagnoses Final diagnoses:  Abnormal EKG  Acute sinusitis, recurrence not specified, unspecified location    Rx / DC Orders ED Discharge Orders         Ordered    lisinopril (ZESTRIL) 20 MG tablet  BH-each morning        06/12/20 1511         1:56 PM patient here with concerns of abnormal EKG when PCP noted bigeminy on her EKG from yesterday as well as high blood pressure and patient was recommended to come to the ER. She was initially evaluated by her PCP for sinus pain for several weeks. Today, EKG did show bigeminy but similar to prior. No associated chest pain shortness of breath or other symptoms concerning for ACS. She is asymptomatic from that standpoint. She is hypertensive with blood pressure of 207/85. Patient did mention her blood pressure has been elevated for the past several months. She is taking lisinopril however ran out of the medication this morning. She is also on several over-the-counter cold medication which may  contribute to her high blood pressure. She does endorse a throbbing headache for the past several days. In the setting of high blood pressure and headache, will obtain head CT scan however my suspicion for subarachnoid hemorrhage is low. She does not have any focal neuro deficit. Will provide blood pressure medication, Tylenol for headache.  3:13 PM Care discussed with oncoming provider who will f/u on head CT scan and likely d/c afterward.   2:57 PM Blood pressure did improve with atenolol.  Headaches improved with Tylenol.  Labs are reassuring.  Chest x-ray is unremarkable.  Head CT scan is currently pending.  Will recommend patient to follow-up with cardiology outpatient for further evaluation and managements of her bigeminy.  Encourage patient to continue taking her lisinopril but should also discuss with her or her cardiologist if she needs a second agent for better blood pressure control.  I will also prescribe Augmentin for her sinus pain which is likely related to sinusitis given prolonged duration of her symptoms.   06/14/20, PA-C 06/12/20 1513    06/14/20, MD 06/13/20 [phone]

## 2020-06-12 NOTE — ED Notes (Signed)
Pt given d/c papers. All questions answered.

## 2020-06-12 NOTE — ED Provider Notes (Addendum)
1553: CT head negative. Discussed results with patient. Trop 11 > 14. She is not having any CP.   Discharge paperwork prepared by previous EDPA including lisinopril prescription. Will dc with augmentin.    Liberty Handy, PA-C 06/12/20 1554    Liberty Handy, PA-C 06/12/20 1555    Rolan Bucco, MD 06/12/20 [phone]

## 2020-07-17 ENCOUNTER — Other Ambulatory Visit: Payer: Self-pay

## 2020-07-17 MED ORDER — PAIN MANAGEMENT IT PUMP REFILL: 1.0000 | Freq: Once | INTRATHECAL | 0 refills | Status: AC

## 2020-08-11 NOTE — Progress Notes (Signed)
PROVIDER NOTE: Information contained herein reflects review and annotations entered in association with encounter. Interpretation of such information and data should be left to medically-trained personnel. Information provided to patient can be located elsewhere in the medical record under "Patient Instructions". Document created using STT-dictation technology, any transcriptional errors that may result from process are unintentional.    Patient: Kendra Nelson  Service Category: Procedure  Provider: Gaspar Cola, MD  DOB: 28-May-1961  DOS: 08/15/2020  Location: [facility]  MRN: [id number]  Setting: Ambulatory - outpatient  Referring Provider: No ref. provider found  Type: Established Patient  Specialty: Interventional Pain Management  PCP: Berkley Harvey, NP   Primary Reason for Visit: Interventional Pain Management Treatment. CC: Back Pain (Mid to lower left is worse )  Procedure:          Intrathecal Drug Delivery System (IDDS):  Type: Reservoir Refill [phone]) No rate change Region: Abdominal Laterality: Left  Type of Pump: Medtronic Synchromed II Delivery Route: Intrathecal Type of Pain Treated: Neuropathic/Nociceptive Primary Medication Class: Opioid/opiate  Medication, Concentration, Infusion Program, & Delivery Rate: Please see scanned programming printout.   Indications: 1. 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 DDD (degenerative disc disease), lumbar   4. Chronic low back pain (Primary Area of Pain) (Bilateral) (R>L)   5. Chronic lower extremity pain (Secondary Area of Pain) (Bilateral) (R>L)   6. Chronic lumbosacral radiculopathy (L5) (Left)   7. Presence of intrathecal pump   8. Adjustment and management of infusion pump   9. Pharmacologic therapy   10. Uncomplicated opioid dependence ([place])    Pain Assessment: Self-Reported Pain Score: 0-No pain/10             Reported level is compatible  with observation.         RTCB: 11/20/2020  Pharmacotherapy Assessment  Analgesic: Oxycodone IR 15m 4x/day(444mdayof oxycodone) (60MME/day) +  PF-(intrathecal)-Fentanyl (13.4 mcg/hr) MME/day: 60MME/day (oral) + 32.16 MME/day (intrathecal) = 93.16 MME/day (Aprox. 1.16 MME/day/kg).   Monitoring: [place] PMP: PDMP reviewed during this encounter.       Pharmacotherapy: No side-effects or adverse reactions reported. Compliance: No problems identified. Effectiveness: Clinically acceptable. Plan: Refer to "POC".  UDS:  Summary  Date Value Ref Range Status  03/23/2019 Note  Final    Comment:    ==================================================================== [employer] Select 13 (MW) ==================================================================== Test                             Result       Flag       Units Drug Present and Declared for Prescription Verification   Oxycodone                      4297         EXPECTED   ng/mg creat   Oxymorphone                    1874         EXPECTED   ng/mg creat   Noroxycodone                   3680         EXPECTED   ng/mg creat   Noroxymorphone                 467  EXPECTED   ng/mg creat    Sources of oxycodone are scheduled prescription medications.    Oxymorphone, noroxycodone, and noroxymorphone are expected    metabolites of oxycodone. Oxymorphone is also available as a    scheduled prescription medication. Drug Present not Declared for Prescription Verification   Fentanyl                       38           UNEXPECTED ng/mg creat   Norfentanyl                    57           UNEXPECTED ng/mg creat    Source of fentanyl is a scheduled prescription medication, including    IV, patch, and transmucosal formulations. Norfentanyl is an expected    metabolite of fentanyl. ==================================================================== Test                      Result    Flag   Units      Ref Range   Creatinine               107              mg/dL      >=20 ==================================================================== Declared Medications:  The flagging and interpretation on this report are based on the  following declared medications.  Unexpected results may arise from  inaccuracies in the declared medications.  **Note: The testing scope of this panel includes these medications:  Oxycodone  **Note: The testing scope of this panel does not include the  following reported medications:  Acitretin  Atorvastatin  Cyclobenzaprine  Lisinopril  Meloxicam  Metoprolol  Multivitamin  Ropinirole (Requip) ==================================================================== For clinical consultation, please call [phone]. ====================================================================     Intrathecal Pump Therapy Assessment  Manufacturer: Medtronic Synchromed Type: Programmable Volume: 40 mL reservoir MRI compatibility: Yes   Drug content:  Primary Medication Class:Opioid Primary Medication:PF-Fentanyl(1,000 mcg/mL) Secondary Medication:PF-Bupivacaine(20.0 mg/mL) Other Medication:see pump readout   Programming:  Type: Simple continuous. See pump readout for details.   Changes:  Medication Change: None at this point Rate Change: No change in rate  Reported side-effects or adverse reactions: None reported  Effectiveness: Described as relatively effective, allowing for increase in activities of daily living (ADL) Clinically meaningful improvement in function (CMIF): Sustained CMIF goals met  Plan: Pump refill today  Pre-op H&P Assessment:  Kendra Nelson is a 59 y.o. (year old), female patient, seen today for interventional treatment. 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 Kendra Nelson has a current medication list which includes the following prescription(s): AMBULATORY NON FORMULARY MEDICATION, atorvastatin, lisinopril, [START ON 08/22/2020] morphine, [START ON 09/21/2020] morphine, [START ON 10/21/2020] morphine, and ropinirole. Her primarily concern today is the Back Pain (Mid to lower left is worse )  Initial Vital Signs:  Pulse/HCG Rate: 78ECG Heart Rate: 70 Temp: (!) 97.3 F (36.3 C) Resp: 16 BP: (!) 206/113 SpO2: 99 %  BMI: Estimated body mass index is 30.11 kg/m as calculated from the following:   Height as of this encounter: $RemoveBeforeD'5\' 3"'hDzXBpnWHoVLBm$  (1.6 m).   Weight as of this encounter: 170 lb (77.1 kg).  Risk Assessment: Allergies: Reviewed. She is allergic  to benadryl [diphenhydramine hcl] and bee venom.  Allergy Precautions: None required Coagulopathies: Reviewed. None identified.  Blood-thinner therapy: None at this time Active Infection(s): Reviewed. None identified. Ms. Hainer is afebrile  Site Confirmation: Ms. Long was asked to confirm the procedure and laterality before marking the site Procedure checklist: Completed Consent: Before the procedure and under the influence of no sedative(s), amnesic(s), or anxiolytics, the patient was informed of the treatment options, risks and possible complications. To fulfill our ethical and legal obligations, as recommended by the American Medical Association's Code of Ethics, I have informed the patient of my clinical impression; the nature and purpose of the treatment or procedure; the risks, benefits, and possible complications of the intervention; the alternatives, including doing nothing; the risk(s) and benefit(s) of the alternative treatment(s) or procedure(s); and the risk(s) and benefit(s) of doing nothing.  Ms. Riederer was provided with information about the general risks and possible complications  associated with most interventional procedures. These include, but are not limited to: failure to achieve desired goals, infection, bleeding, organ or nerve damage, allergic reactions, paralysis, and/or death.  In addition, she was informed of those risks and possible complications associated to this particular procedure, which include, but are not limited to: damage to the implant; failure to decrease pain; local, systemic, or serious CNS infections, intraspinal abscess with possible cord compression and paralysis, or life-threatening such as meningitis; bleeding; organ damage; nerve injury or damage with subsequent sensory, motor, and/or autonomic system dysfunction, resulting in transient or permanent pain, numbness, and/or weakness of one or several areas of the body; allergic reactions, either minor or major life-threatening, such as anaphylactic or anaphylactoid reactions.  Furthermore, Ms. Hendricksen was informed of those risks and complications associated with the medications. These include, but are not limited to: allergic reactions (i.e.: anaphylactic or anaphylactoid reactions); endorphine suppression; bradycardia and/or hypotension; water retention and/or peripheral vascular relaxation leading to lower extremity edema and possible stasis ulcers; respiratory depression and/or shortness of breath; decreased metabolic rate leading to weight gain; swelling or edema; medication-induced neural toxicity; particulate matter embolism and blood vessel occlusion with resultant organ, and/or nervous system infarction; and/or intrathecal granuloma formation with possible spinal cord compression and permanent paralysis.  Before refilling the pump Ms. Pellot was informed that some of the medications used in the devise may not be FDA approved for such use and therefore it constitutes an off-label use of the medications.  Finally, she was informed that Medicine is not an exact science; therefore, there is also  the possibility of unforeseen or unpredictable risks and/or possible complications that may result in a catastrophic outcome. The patient indicated having understood very clearly. We have given the patient no guarantees and we have made no promises. Enough time was given to the patient to ask questions, all of which were answered to the patient's satisfaction. Ms. Meriweather has indicated that she wanted to continue with the procedure. Attestation: I, the ordering provider, attest that I have discussed with the patient the benefits, risks, side-effects, alternatives, likelihood of achieving goals, and potential problems during recovery for the procedure that I have provided informed consent. Date  Time: 08/15/2020 12:57 PM  Pre-Procedure Preparation:  Monitoring: As per clinic protocol. Respiration, ETCO2, SpO2, BP, heart rate and rhythm monitor placed and checked for adequate function Safety Precautions: Patient was assessed for positional comfort and pressure points before starting the procedure. Time-out: I initiated and conducted the "Time-out" before starting the procedure, as per protocol. The patient was asked to participate  by confirming the accuracy of the "Time Out" information. Verification of the correct person, site, and procedure were performed and confirmed by me, the nursing staff, and the patient. "Time-out" conducted as per Joint Commission's Universal Protocol (UP.01.01.01). Time: 1252  Description of Procedure:          Position: Supine Target Area: Central-port of intrathecal pump. Approach: Anterior, 90 degree angle approach. Area Prepped: Entire Area around the pump implant. DuraPrep (Iodine Povacrylex [0.7% available iodine] and Isopropyl Alcohol, 74% w/w) Safety Precautions: Aspiration looking for blood return was conducted prior to all injections. At no point did we inject any substances, as a needle was being advanced. No attempts were made at seeking any paresthesias. Safe  injection practices and needle disposal techniques used. Medications properly checked for expiration dates. SDV (single dose vial) medications used. Description of the Procedure: Protocol guidelines were followed. Two nurses trained to do implant refills were present during the entire procedure. The refill medication was checked by both healthcare providers as well as the patient. The patient was included in the "Time-out" to verify the medication. The patient was placed in position. The pump was identified. The area was prepped in the usual manner. The sterile template was positioned over the pump, making sure the side-port location matched that of the pump. Both, the pump and the template were held for stability. The needle provided in the Medtronic Kit was then introduced thru the center of the template and into the central port. The pump content was aspirated and discarded volume documented. The new medication was slowly infused into the pump, thru the filter, making sure to avoid overpressure of the device. The needle was then removed and the area cleansed, making sure to leave some of the prepping solution back to take advantage of its long term bactericidal properties. The pump was interrogated and programmed to reflect the correct medication, volume, and dosage. The program was printed and taken to the physician for approval. Once checked and signed by the physician, a copy was provided to the patient and another scanned into the EMR. Vitals:   08/15/20 1255 08/15/20 1303 08/15/20 1330  BP: (!) 206/113 (!) 200/91 (!) 165/97  Pulse: 78    Resp: 16    Temp: (!) 97.3 F (36.3 C)    TempSrc: Temporal    SpO2: 99%    Weight: 170 lb (77.1 kg)    Height: $Remove'5\' 3"'JFjSZRw$  (1.6 m)      Start Time: 1252 hrs. End Time: 1315 hrs. Materials & Medications: Medtronic Refill Kit Medication(s): Please see chart orders for details.  Imaging Guidance:          Type of Imaging Technique: None used Indication(s):  N/A Exposure Time: No patient exposure Contrast: None used. Fluoroscopic Guidance: N/A Ultrasound Guidance: N/A Interpretation: N/A  Antibiotic Prophylaxis:   Anti-infectives (From admission, onward)   None     Indication(s): None identified  Post-operative Assessment:  Post-procedure Vital Signs:  Pulse/HCG Rate: 7870 Temp: (!) 97.3 F (36.3 C) Resp: 16 BP: (!) 165/97 SpO2: 99 %  EBL: None  Complications: No immediate post-treatment complications observed by team, or reported by patient.  Note: The patient tolerated the entire procedure well. A repeat set of vitals were taken after the procedure and the patient was kept under observation following institutional policy, for this type of procedure. Post-procedural neurological assessment was performed, showing return to baseline, prior to discharge. The patient was provided with post-procedure discharge instructions, including a section on how to  identify potential problems. Should any problems arise concerning this procedure, the patient was given instructions to immediately contact us, at any time, without hesitation. In any case, we plan to contact the patient by telephone for a follow-up status report regarding this interventional procedure.  Comments:  No additional relevant information.  Plan of Care  Orders:  Orders Placed This Encounter  Procedures  . PUMP REFILL    Maintain Protocol by having two(2) healthcare providers during procedure and programming.    Scheduling Instructions:     Please refill intrathecal pump today.    Order Specific Question:   Where will this procedure be performed?    Answer:   [facility]  . PUMP REFILL    Whenever possible schedule on a procedure today.    Standing Status:   Future    Standing Expiration Date:   01/12/2021    Scheduling Instructions:     Please schedule intrathecal pump refill based on pump programming. Avoid schedule intervals of more than 120 days (4  months).    Order Specific Question:   Where will this procedure be performed?    Answer:   [facility]  . Informed Consent Details: Physician/Practitioner Attestation; Transcribe to consent form and obtain patient signature    Transcribe to consent form and obtain patient signature.    Order Specific Question:   Physician/Practitioner attestation of informed consent for procedure/surgical case    Answer:   I, the physician/practitioner, attest that I have discussed with the patient the benefits, risks, side effects, alternatives, likelihood of achieving goals and potential problems during recovery for the procedure that I have provided informed consent.    Order Specific Question:   Procedure    Answer:   Intrathecal pump refill    Order Specific Question:   Physician/Practitioner performing the procedure    Answer:   Attending Physician: Kathlen Brunswick. Dossie Arbour, MD & designated trained staff    Order Specific Question:   Indication/Reason    Answer:   Chronic Pain Syndrome (G89.4), presence of an intrathecal pump (Z97.8)   Chronic Opioid Analgesic:  Oxycodone IR $RemoveBefo'10mg'kwvtKkzzQUb$  4x/da'40mg'$ /dayof oxycodone) (60MME/day) +  PF-(intrathecal)-Fentanyl (13.4 mcg/hr) MME/day: 60MME/day (oral) + 32.16 MME/day (intrathecal) = 93.16 MME/day (Aprox. 1.16 MME/day/kg).   Medications ordered for procedure: Meds ordered this encounter  Medications  . morphine (MS CONTIN) 30 MG 12 hr tablet    Sig: Take 1 tablet (30 mg total) by mouth every 12 (twelve) hours. Must last 30 days. Do not break tablet    Dispense:  60 tablet    Refill:  0    Chronic Pain: STOP Act (Not applicable) Fill 1 day early if closed on refill date. Avoid benzodiazepines within 8 hours of opioids  . morphine (MS CONTIN) 30 MG 12 hr tablet    Sig: Take 1 tablet (30 mg total) by mouth every 12 (twelve) hours. Must last 30 days. Do not break tablet    Dispense:  60 tablet    Refill:  0    Chronic Pain: STOP Act (Not applicable) Fill  1 day early if closed on refill date. Avoid benzodiazepines within 8 hours of opioids  . morphine (MS CONTIN) 30 MG 12 hr tablet    Sig: Take 1 tablet (30 mg total) by mouth every 12 (twelve) hours. Must last 30 days. Do not break tablet    Dispense:  60 tablet    Refill:  0    Chronic Pain: STOP Act (Not applicable) Fill  1 day early if closed on refill date. Avoid benzodiazepines within 8 hours of opioids   Medications administered: Hassan Rowan A. Blasco had no medications administered during this visit.  See the medical record for exact dosing, route, and time of administration.  Follow-up plan:   Return for Pump Refill (Max:64mo.       Interventional management options: Planned, scheduled, and/or pending:   Intrathecal pump refill as per pump programming.   Under consideration:   Diagnostic left IA knee joint inj  Diagnostic bilateral L2 TFESI  Diagnostic left genicular NB  Possible left genicular nerve RFA  Diagnostic caudal ESI + epidurogram Possible Racz procedure Diagnostic bilateral lumbar facet block Possible bilateral lumbar facet RFA (unlikely due to extensive lumbar fusion from L3-S1)    Therapeutic/palliative (PRN):   Therapeutic/palliative intrathecal pump  management (analysis and refill)     Recent Visits No visits were found meeting these conditions. Showing recent visits within past 90 days and meeting all other requirements Today's Visits Date Type Provider Dept  08/15/20 Procedure visit NMilinda Pointer MD [facility]  Showing today's visits and meeting all other requirements Future Appointments Date Type Provider Dept  11/12/20 Appointment NMilinda Pointer MD [facility]  Showing future appointments within next 90 days and meeting all other requirements  Disposition: Discharge home  Discharge (Date  Time): 08/15/2020;   hrs.   Primary Care Physician: J[facility]erkley Harvey NP [facility]  Facility Note by: FGaspar Cola MD Date: 08/15/2020; Time: 2:48 PM  Disclaimer:  Medicine is not an eChief Strategy Officer The only guarantee in medicine is that nothing is guaranteed. It is important to note that the decision to proceed with this intervention was based on the information collected from the patient. The Data and conclusions were drawn from the patient's questionnaire, the interview, and the physical examination. Because the information was provided in large part by the patient, it cannot be guaranteed that it has not been purposely or unconsciously manipulated. Every effort has been made to obtain as much relevant data as possible for this evaluation. It is important to note that the conclusions that lead to this procedure are derived in large part from the available data. Always take into account that the treatment will also be dependent on availability of resources and existing treatment guidelines, considered by other Pain Management Practitioners as being common knowledge and practice, at the time of the intervention. For Medico-Legal purposes, it is also important to point out that variation in procedural techniques and pharmacological choices are the acceptable norm. The indications, contraindications, technique, and results of the above procedure should only be interpreted and judged by a Board-Certified Interventional Pain Specialist with extensive familiarity and expertise in the same exact procedure and technique.

## 2020-08-15 ENCOUNTER — Other Ambulatory Visit: Payer: Self-pay

## 2020-08-15 ENCOUNTER — Encounter: Payer: Self-pay | Admitting: Pain Medicine

## 2020-08-15 ENCOUNTER — Ambulatory Visit: Payer: Medicare HMO | Attending: Pain Medicine | Admitting: Pain Medicine

## 2020-08-15 VITALS — BP 165/97 | HR 78 | Temp 97.3°F | Resp 16 | Ht 63.0 in | Wt 170.0 lb

## 2020-08-15 DIAGNOSIS — Z451 Encounter for adjustment and management of infusion pump: Secondary | ICD-10-CM | POA: Diagnosis not present

## 2020-08-15 DIAGNOSIS — G894 Chronic pain syndrome: Secondary | ICD-10-CM | POA: Diagnosis not present

## 2020-08-15 DIAGNOSIS — G8929 Other chronic pain: Secondary | ICD-10-CM

## 2020-08-15 DIAGNOSIS — M79605 Pain in left leg: Secondary | ICD-10-CM

## 2020-08-15 DIAGNOSIS — M5417 Radiculopathy, lumbosacral region: Secondary | ICD-10-CM

## 2020-08-15 DIAGNOSIS — M5442 Lumbago with sciatica, left side: Secondary | ICD-10-CM | POA: Diagnosis not present

## 2020-08-15 DIAGNOSIS — M79604 Pain in right leg: Secondary | ICD-10-CM

## 2020-08-15 DIAGNOSIS — M5136 Other intervertebral disc degeneration, lumbar region: Secondary | ICD-10-CM | POA: Diagnosis not present

## 2020-08-15 DIAGNOSIS — M961 Postlaminectomy syndrome, not elsewhere classified: Secondary | ICD-10-CM

## 2020-08-15 DIAGNOSIS — M5441 Lumbago with sciatica, right side: Secondary | ICD-10-CM

## 2020-08-15 DIAGNOSIS — Z79899 Other long term (current) drug therapy: Secondary | ICD-10-CM

## 2020-08-15 DIAGNOSIS — F1120 Opioid dependence, uncomplicated: Secondary | ICD-10-CM | POA: Diagnosis not present

## 2020-08-15 DIAGNOSIS — Z978 Presence of other specified devices: Secondary | ICD-10-CM

## 2020-08-15 MED ORDER — MORPHINE SULFATE ER 30 MG PO TBCR: 30.0000 mg | EXTENDED_RELEASE_TABLET | Freq: Two times a day (BID) | ORAL | 0 refills | Status: DC

## 2020-08-15 NOTE — Patient Instructions (Signed)
____________________________________________________________________________________________  Medication Rules  Purpose: To inform patients, and their family members, of our rules and regulations.  Applies to: All patients receiving prescriptions (written or electronic).  Pharmacy of record: Pharmacy where electronic prescriptions will be sent. If written prescriptions are taken to a different pharmacy, please inform the nursing staff. The pharmacy listed in the electronic medical record should be the one where you would like electronic prescriptions to be sent.  Electronic prescriptions: In compliance with the [place] Strengthen Opioid Misuse Prevention (STOP) Act of 2017 (Session Law 2017-74/H243), effective August 31, 2018, all controlled substances must be electronically prescribed. Calling prescriptions to the pharmacy will cease to exist.  Prescription refills: Only during scheduled appointments. Applies to all prescriptions.  NOTE: The following applies primarily to controlled substances (Opioid* Pain Medications).   Type of encounter (visit): For patients receiving controlled substances, face-to-face visits are required. (Not an option or up to the patient.)  Patient's responsibilities: 1. Pain Pills: Bring all pain pills to every appointment (except for procedure appointments). 2. Pill Bottles: Bring pills in original pharmacy bottle. Always bring the newest bottle. Bring bottle, even if empty. 3. Medication refills: You are responsible for knowing and keeping track of what medications you take and those you need refilled. The day before your appointment: write a list of all prescriptions that need to be refilled. The day of the appointment: give the list to the admitting nurse. Prescriptions will be written only during appointments. No prescriptions will be written on procedure days. If you forget a medication: it will not be "Called in", "Faxed", or "electronically sent".  You will need to get another appointment to get these prescribed. No early refills. Do not call asking to have your prescription filled early. 4. Prescription Accuracy: You are responsible for carefully inspecting your prescriptions before leaving our office. Have the discharge nurse carefully go over each prescription with you, before taking them home. Make sure that your name is accurately spelled, that your address is correct. Check the name and dose of your medication to make sure it is accurate. Check the number of pills, and the written instructions to make sure they are clear and accurate. Make sure that you are given enough medication to last until your next medication refill appointment. 5. Taking Medication: Take medication as prescribed. When it comes to controlled substances, taking less pills or less frequently than prescribed is permitted and encouraged. Never take more pills than instructed. Never take medication more frequently than prescribed.  6. Inform other Doctors: Always inform, all of your healthcare providers, of all the medications you take. 7. Pain Medication from other Providers: You are not allowed to accept any additional pain medication from any other Doctor or Healthcare provider. There are two exceptions to this rule. (see below) In the event that you require additional pain medication, you are responsible for notifying us, as stated below. 8. Cough Medicine: Often these contain an opioid, such as codeine or hydrocodone. Never accept or take cough medicine containing these opioids if you are already taking an opioid* medication. The combination may cause respiratory failure and death. 9. Medication Agreement: You are responsible for carefully reading and following our Medication Agreement. This must be signed before receiving any prescriptions from our practice. Safely store a copy of your signed Agreement. Violations to the Agreement will result in no further prescriptions.  (Additional copies of our Medication Agreement are available upon request.) 10. Laws, Rules, & Regulations: All patients are expected to follow all   Federal and State Laws, Statutes, Rules, & Regulations. Ignorance of the Laws does not constitute a valid excuse.  11. Illegal drugs and Controlled Substances: The use of illegal substances (including, but not limited to marijuana and its derivatives) and/or the illegal use of any controlled substances is strictly prohibited. Violation of this rule may result in the immediate and permanent discontinuation of any and all prescriptions being written by our practice. The use of any illegal substances is prohibited. 12. Adopted CDC guidelines & recommendations: Target dosing levels will be at or below 60 MME/day. Use of benzodiazepines** is not recommended.  Exceptions: There are only two exceptions to the rule of not receiving pain medications from other Healthcare Providers. 1. Exception #1 (Emergencies): In the event of an emergency (i.e.: accident requiring emergency care), you are allowed to receive additional pain medication. However, you are responsible for: As soon as you are able, call our office [phone], at any time of the day or night, and leave a message stating your name, the date and nature of the emergency, and the name and dose of the medication prescribed. In the event that your call is answered by a member of our staff, make sure to document and save the date, time, and the name of the person that took your information.  2. Exception #2 (Planned Surgery): In the event that you are scheduled by another doctor or dentist to have any type of surgery or procedure, you are allowed (for a period no longer than 30 days), to receive additional pain medication, for the acute post-op pain. However, in this case, you are responsible for picking up a copy of our "Post-op Pain Management for Surgeons" handout, and giving it to your surgeon or dentist. This  document is available at our office, and does not require an appointment to obtain it. Simply go to our office during business hours (Monday-Thursday from 8:00 AM to 4:00 PM) (Friday 8:00 AM to 12:00 Noon) or if you have a scheduled appointment with us, prior to your surgery, and ask for it by name. In addition, you are responsible for: calling our office [phone], at any time of the day or night, and leaving a message stating your name, name of your surgeon, type of surgery, and date of procedure or surgery. Failure to comply with your responsibilities may result in termination of therapy involving the controlled substances.  *Opioid medications include: morphine, codeine, oxycodone, oxymorphone, hydrocodone, hydromorphone, meperidine, tramadol, tapentadol, buprenorphine, fentanyl, methadone. **Benzodiazepine medications include: diazepam (Valium), alprazolam (Xanax), clonazepam (Klonopine), lorazepam (Ativan), clorazepate (Tranxene), chlordiazepoxide (Librium), estazolam (Prosom), oxazepam (Serax), temazepam (Restoril), triazolam (Halcion) (Last updated: 07/29/2020) ____________________________________________________________________________________________   ____________________________________________________________________________________________  Medication Recommendations and Reminders  Applies to: All patients receiving prescriptions (written and/or electronic).  Medication Rules & Regulations: These rules and regulations exist for your safety and that of others. They are not flexible and neither are we. Dismissing or ignoring them will be considered "non-compliance" with medication therapy, resulting in complete and irreversible termination of such therapy. (See document titled "Medication Rules" for more details.) In all conscience, because of safety reasons, we cannot continue providing a therapy where the patient does not follow instructions.  Pharmacy of record:   Definition:  This is the pharmacy where your electronic prescriptions will be sent.   We do not endorse any particular pharmacy, however, we have experienced problems with [facility] not securing enough medication supply for the community.  We do not restrict you in your choice of pharmacy. However,   once we write for your prescriptions, we will NOT be re-sending more prescriptions to fix restricted supply problems created by your pharmacy, or your insurance.   The pharmacy listed in the electronic medical record should be the one where you want electronic prescriptions to be sent.  If you choose to change pharmacy, simply notify our nursing staff.  Recommendations:  Keep all of your pain medications in a safe place, under lock and key, even if you live alone. We will NOT replace lost, stolen, or damaged medication.  After you fill your prescription, take 1 week's worth of pills and put them away in a safe place. You should keep a separate, properly labeled bottle for this purpose. The remainder should be kept in the original bottle. Use this as your primary supply, until it runs out. Once it's gone, then you know that you have 1 week's worth of medicine, and it is time to come in for a prescription refill. If you do this correctly, it is unlikely that you will ever run out of medicine.  To make sure that the above recommendation works, it is very important that you make sure your medication refill appointments are scheduled at least 1 week before you run out of medicine. To do this in an effective manner, make sure that you do not leave the office without scheduling your next medication management appointment. Always ask the nursing staff to show you in your prescription , when your medication will be running out. Then arrange for the receptionist to get you a return appointment, at least 7 days before you run out of medicine. Do not wait until you have 1 or 2 pills left, to come in. This is very poor planning and  does not take into consideration that we may need to cancel appointments due to bad weather, sickness, or emergencies affecting our staff.  DO NOT ACCEPT A "Partial Fill": If for any reason your pharmacy does not have enough pills/tablets to completely fill or refill your prescription, do not allow for a "partial fill". The law allows the pharmacy to complete that prescription within 72 hours, without requiring a new prescription. If they do not fill the rest of your prescription within those 72 hours, you will need a separate prescription to fill the remaining amount, which we will NOT provide. If the reason for the partial fill is your insurance, you will need to talk to the pharmacist about payment alternatives for the remaining tablets, but again, DO NOT ACCEPT A PARTIAL FILL, unless you can trust your pharmacist to obtain the remainder of the pills within 72 hours.  Prescription refills and/or changes in medication(s):   Prescription refills, and/or changes in dose or medication, will be conducted only during scheduled medication management appointments. (Applies to both, written and electronic prescriptions.)  No refills on procedure days. No medication will be changed or started on procedure days. No changes, adjustments, and/or refills will be conducted on a procedure day. Doing so will interfere with the diagnostic portion of the procedure.  No phone refills. No medications will be "called into the pharmacy".  No Fax refills.  No weekend refills.  No Holliday refills.  No after hours refills.  Remember:  Business hours are:  Monday to Thursday 8:00 AM to 4:00 PM Provider's Schedule: Drexel Ivey, MD - Appointments are:  Medication management: Monday and Wednesday 8:00 AM to 4:00 PM Procedure day: Tuesday and Thursday 7:30 AM to 4:00 PM Bilal Lateef, MD - Appointments are:    Medication management: Tuesday and Thursday 8:00 AM to 4:00 PM Procedure day: Monday and Wednesday  7:30 AM to 4:00 PM (Last update: 03/20/2020) ____________________________________________________________________________________________   ____________________________________________________________________________________________  CBD (cannabidiol) WARNING  Applicable to: All individuals currently taking or considering taking CBD (cannabidiol) and, more important, all patients taking opioid analgesic controlled substances (pain medication). (Example: oxycodone; oxymorphone; hydrocodone; hydromorphone; morphine; methadone; tramadol; tapentadol; fentanyl; buprenorphine; butorphanol; dextromethorphan; meperidine; codeine; etc.)  Legal status: CBD remains a Schedule I drug prohibited for any use. CBD is illegal with one exception. In the [place], CBD has a limited Food and Drug Administration (FDA) approval for the treatment of two specific types of epilepsy disorders. Only one CBD product has been approved by the FDA for this purpose: "Epidiolex". FDA is aware that some companies are marketing products containing cannabis and cannabis-derived compounds in ways that violate the Federal Food, Drug and Cosmetic Act (FD&C Act) and that may put the health and safety of consumers at risk. The FDA, a Federal agency, has not enforced the CBD status since 2018.   Legality: Some manufacturers ship CBD products nationally, which is illegal. Often such products are sold online and are therefore available throughout the country. CBD is openly sold in head shops and health food stores in some states where such sales have not been explicitly legalized. Selling unapproved products with unsubstantiated therapeutic claims is not only a violation of the law, but also can put patients at risk, as these products have not been proven to be safe or effective. Federal illegality makes it difficult to conduct research on CBD.  Reference: "FDA Regulation of Cannabis and Cannabis-Derived Products, Including Cannabidiol  (CBD)" - https://www.fda.gov/news-events/public-health-focus/fda-regulation-cannabis-and-cannabis-derived-products-including-cannabidiol-cbd  Warning: CBD is not FDA approved and has not undergo the same manufacturing controls as prescription drugs.  This means that the purity and safety of available CBD may be questionable. Most of the time, despite manufacturer's claims, it is contaminated with THC (delta-9-tetrahydrocannabinol - the chemical in marijuana responsible for the "HIGH").  When this is the case, the THC contaminant will trigger a positive urine drug screen (UDS) test for Marijuana (carboxy-THC). Because a positive UDS for any illicit substance is a violation of our medication agreement, your opioid analgesics (pain medicine) may be permanently discontinued.  MORE ABOUT CBD  General Information: CBD  is a derivative of the Marijuana (cannabis sativa) plant discovered in 1940. It is one of the 113 identified substances found in Marijuana. It accounts for up to 40% of the plant's extract. As of 2018, preliminary clinical studies on CBD included research for the treatment of anxiety, movement disorders, and pain. CBD is available and consumed in multiple forms, including inhalation of smoke or vapor, as an aerosol spray, and by mouth. It may be supplied as an oil containing CBD, capsules, dried cannabis, or as a liquid solution. CBD is thought not to be as psychoactive as THC (delta-9-tetrahydrocannabinol - the chemical in marijuana responsible for the "HIGH"). Studies suggest that CBD may interact with different biological target receptors in the body, including cannabinoid and other neurotransmitter receptors. As of 2018 the mechanism of action for its biological effects has not been determined.  Side-effects  Adverse reactions: Dry mouth, diarrhea, decreased appetite, fatigue, drowsiness, malaise, weakness, sleep disturbances, and others.  Drug interactions: CBC may interact with other  medications such as blood-thinners. (Last update: 04/06/2020) ____________________________________________________________________________________________    

## 2020-08-16 ENCOUNTER — Telehealth: Payer: Self-pay

## 2020-08-16 NOTE — Telephone Encounter (Signed)
Post pump refill follow up.  LM 

## 2020-08-20 MED FILL — Medication: INTRATHECAL | Qty: 1 | Status: AC

## 2020-08-21 ENCOUNTER — Telehealth: Payer: Self-pay | Admitting: Pain Medicine

## 2020-08-21 NOTE — Telephone Encounter (Signed)
Patient husband called about picking meds up early. I told him that it could not be refilled until tomorrow (08/22/20).

## 2020-09-25 DIAGNOSIS — J00 Acute nasopharyngitis [common cold]: Secondary | ICD-10-CM | POA: Diagnosis not present

## 2020-09-25 DIAGNOSIS — R69 Illness, unspecified: Secondary | ICD-10-CM | POA: Diagnosis not present

## 2020-10-16 ENCOUNTER — Other Ambulatory Visit: Payer: Self-pay

## 2020-10-16 MED ORDER — PAIN MANAGEMENT IT PUMP REFILL: 1.0000 | Freq: Once | INTRATHECAL | 0 refills | Status: AC

## 2020-11-07 DIAGNOSIS — S61411A Laceration without foreign body of right hand, initial encounter: Secondary | ICD-10-CM | POA: Diagnosis not present

## 2020-11-07 DIAGNOSIS — S61401A Unspecified open wound of right hand, initial encounter: Secondary | ICD-10-CM | POA: Diagnosis not present

## 2020-11-09 NOTE — Progress Notes (Addendum)
PROVIDER NOTE: Information contained herein reflects review and annotations entered in association with encounter. Interpretation of such information and data should be left to medically-trained personnel. Information provided to patient can be located elsewhere in the medical record under "Patient Instructions". Document created using STT-dictation technology, any transcriptional errors that may result from process are unintentional.    Patient: Kendra Nelson  Service Category: Procedure  Provider: Gaspar Cola, MD  DOB: 1960/11/25  DOS: 11/12/2020  Location: [facility]  MRN: [id number]  Setting: Ambulatory - outpatient  Referring Provider: Berkley Harvey, NP  Type: Established Patient  Specialty: Interventional Pain Management  PCP: Berkley Harvey, NP   Primary Reason for Visit: Interventional Pain Management Treatment. CC: Back Pain (low)  Procedure:          Intrathecal Drug Delivery System (IDDS):  Type: Reservoir Refill [phone]) No rate change Region: Abdominal Laterality: Left  Type of Pump: Medtronic Synchromed II Delivery Route: Intrathecal Type of Pain Treated: Neuropathic/Nociceptive Primary Medication Class: Opioid/opiate  Medication, Concentration, Infusion Program, & Delivery Rate: Please see scanned programming printout.   Indications: 1. 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 DDD (degenerative disc disease), lumbar   4. Chronic low back pain (Primary Area of Pain) (Bilateral) (R>L)   5. Chronic lower extremity pain (Secondary Area of Pain) (Bilateral) (R>L)   6. Chronic lumbosacral radiculopathy (L5) (Left)   7. Presence of intrathecal pump   8. Adjustment and management of infusion pump   9. Pharmacologic therapy   10. Uncomplicated opioid dependence ([place])    Pain Assessment: Self-Reported Pain Score: 3 /10             Reported level is compatible with observation.         RTCB:  02/17/2021  Pharmacotherapy Assessment  Analgesic: Morphine (MS Contin) 30 mg PO BID (60 mg/day of morphine) (60 MME/day) +  PF-(intrathecal)-Fentanyl (13.4 mcg/hr)  changed from Oxycodone IR 10 mg 4x/day (40 mg/day of oxycodone) (60 MME/day), due to national shortage MME/day: 60 MME/day (oral) + 32.16 MME/day (intrathecal) = 93.16 MME/day (Aprox. 1.16 MME/day/kg).   Monitoring: [place] PMP: PDMP reviewed during this encounter.       Pharmacotherapy: No side-effects or adverse reactions reported. Compliance: No problems identified. Effectiveness: Clinically acceptable. Plan: Refer to "POC".  UDS:  Summary  Date Value Ref Range Status  03/23/2019 Note  Final    Comment:    ==================================================================== [employer] Select 13 (MW) ==================================================================== Test                             Result       Flag       Units Drug Present and Declared for Prescription Verification   Oxycodone                      4297         EXPECTED   ng/mg creat   Oxymorphone                    1874         EXPECTED   ng/mg creat   Noroxycodone                   3680         EXPECTED   ng/mg creat   Noroxymorphone  467          EXPECTED   ng/mg creat    Sources of oxycodone are scheduled prescription medications.    Oxymorphone, noroxycodone, and noroxymorphone are expected    metabolites of oxycodone. Oxymorphone is also available as a    scheduled prescription medication. Drug Present not Declared for Prescription Verification   Fentanyl                       38           UNEXPECTED ng/mg creat   Norfentanyl                    57           UNEXPECTED ng/mg creat    Source of fentanyl is a scheduled prescription medication, including    IV, patch, and transmucosal formulations. Norfentanyl is an expected    metabolite of fentanyl. ==================================================================== Test                       Result    Flag   Units      Ref Range   Creatinine              107              mg/dL      >=20 ==================================================================== Declared Medications:  The flagging and interpretation on this report are based on the  following declared medications.  Unexpected results may arise from  inaccuracies in the declared medications.  **Note: The testing scope of this panel includes these medications:  Oxycodone  **Note: The testing scope of this panel does not include the  following reported medications:  Acitretin  Atorvastatin  Cyclobenzaprine  Lisinopril  Meloxicam  Metoprolol  Multivitamin  Ropinirole (Requip) ==================================================================== For clinical consultation, please call [phone]. ====================================================================     Intrathecal Pump Therapy Assessment  Manufacturer: Medtronic Synchromed Type: Programmable Volume: 40 mL reservoir MRI compatibility: Yes   Drug content:  Primary Medication Class: Opioid Primary Medication: PF-Fentanyl (1,000 mcg/mL)  Secondary Medication: PF-Bupivacaine (20.0 mg/mL)  Other Medication: see pump readout   Programming:  Type: Simple continuous. See pump readout for details.   Changes:  Medication Change: None at this point Rate Change: No change in rate  Reported side-effects or adverse reactions: None reported  Effectiveness: Described as relatively effective, allowing for increase in activities of daily living (ADL) Clinically meaningful improvement in function (CMIF): Sustained CMIF goals met  Plan: Pump refill today  Pre-op H&P Assessment:  Kendra Nelson is a 60 y.o. (year old), female patient, seen today for interventional treatment. 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 Kendra Nelson has a current medication list which includes the following prescription(s): AMBULATORY NON FORMULARY MEDICATION, atorvastatin, lisinopril, ropinirole, [START ON 11/19/2020] morphine, [START ON 12/19/2020] morphine, and [START ON 01/18/2021] morphine. Her primarily concern today is the Back Pain (low)  Initial Vital Signs:  Pulse/HCG Rate: 74  Temp: (!) 96.8 F (36 C) Resp: 16 BP: (!) 220/110 SpO2: 100 %  BMI: Estimated body mass index is 30.11 kg/m as calculated from the following:   Height as of this encounter: _0  (1.6 m).   Weight as of this encounter: 170 lb (  77.1 kg).  Risk Assessment: Allergies: Reviewed. She is allergic to benadryl [diphenhydramine hcl] and bee venom.  Allergy Precautions: None required Coagulopathies: Reviewed. None identified.  Blood-thinner therapy: None at this time Active Infection(s): Reviewed. None identified. Ms. Lennon is afebrile  Site Confirmation: Ms. Bucher was asked to confirm the procedure and laterality before marking the site Procedure checklist: Completed Consent: Before the procedure and under the influence of no sedative(s), amnesic(s), or anxiolytics, the patient was informed of the treatment options, risks and possible complications. To fulfill our ethical and legal obligations, as recommended by the American Medical Association's Code of Ethics, I have informed the patient of my clinical impression; the nature and purpose of the treatment or procedure; the risks, benefits, and possible complications of the intervention; the alternatives, including doing nothing; the risk(s) and benefit(s) of the alternative treatment(s) or procedure(s); and the risk(s) and benefit(s) of doing nothing.  Ms. Kovarik was provided with information about the general  risks and possible complications associated with most interventional procedures. These include, but are not limited to: failure to achieve desired goals, infection, bleeding, organ or nerve damage, allergic reactions, paralysis, and/or death.  In addition, she was informed of those risks and possible complications associated to this particular procedure, which include, but are not limited to: damage to the implant; failure to decrease pain; local, systemic, or serious CNS infections, intraspinal abscess with possible cord compression and paralysis, or life-threatening such as meningitis; bleeding; organ damage; nerve injury or damage with subsequent sensory, motor, and/or autonomic system dysfunction, resulting in transient or permanent pain, numbness, and/or weakness of one or several areas of the body; allergic reactions, either minor or major life-threatening, such as anaphylactic or anaphylactoid reactions.  Furthermore, Ms. Gingerich was informed of those risks and complications associated with the medications. These include, but are not limited to: allergic reactions (i.e.: anaphylactic or anaphylactoid reactions); endorphine suppression; bradycardia and/or hypotension; water retention and/or peripheral vascular relaxation leading to lower extremity edema and possible stasis ulcers; respiratory depression and/or shortness of breath; decreased metabolic rate leading to weight gain; swelling or edema; medication-induced neural toxicity; particulate matter embolism and blood vessel occlusion with resultant organ, and/or nervous system infarction; and/or intrathecal granuloma formation with possible spinal cord compression and permanent paralysis.  Before refilling the pump Ms. Hewes was informed that some of the medications used in the devise may not be FDA approved for such use and therefore it constitutes an off-label use of the medications.  Finally, she was informed that Medicine is not an exact  science; therefore, there is also the possibility of unforeseen or unpredictable risks and/or possible complications that may result in a catastrophic outcome. The patient indicated having understood very clearly. We have given the patient no guarantees and we have made no promises. Enough time was given to the patient to ask questions, all of which were answered to the patient's satisfaction. Ms. Cadenas has indicated that she wanted to continue with the procedure. Attestation: I, the ordering provider, attest that I have discussed with the patient the benefits, risks, side-effects, alternatives, likelihood of achieving goals, and potential problems during recovery for the procedure that I have provided informed consent. Date  Time: 11/12/2020  1:25 PM  Pre-Procedure Preparation:  Monitoring: As per clinic protocol. Respiration, ETCO2, SpO2, BP, heart rate and rhythm monitor placed and checked for adequate function Safety Precautions: Patient was assessed for positional comfort and pressure points before starting the procedure. Time-out: I initiated and conducted the "Time-out" before starting  the procedure, as per protocol. The patient was asked to participate by confirming the accuracy of the "Time Out" information. Verification of the correct person, site, and procedure were performed and confirmed by me, the nursing staff, and the patient. "Time-out" conducted as per Joint Commission's Universal Protocol (UP.01.01.01). Time: 1333  Description of Procedure:          Position: Supine Target Area: Central-port of intrathecal pump. Approach: Anterior, 90 degree angle approach. Area Prepped: Entire Area around the pump implant. DuraPrep (Iodine Povacrylex [0.7% available iodine] and Isopropyl Alcohol, 74% w/w) Safety Precautions: Aspiration looking for blood return was conducted prior to all injections. At no point did we inject any substances, as a needle was being advanced. No attempts were made  at seeking any paresthesias. Safe injection practices and needle disposal techniques used. Medications properly checked for expiration dates. SDV (single dose vial) medications used. Description of the Procedure: Protocol guidelines were followed. Two nurses trained to do implant refills were present during the entire procedure. The refill medication was checked by both healthcare providers as well as the patient. The patient was included in the "Time-out" to verify the medication. The patient was placed in position. The pump was identified. The area was prepped in the usual manner. The sterile template was positioned over the pump, making sure the side-port location matched that of the pump. Both, the pump and the template were held for stability. The needle provided in the Medtronic Kit was then introduced thru the center of the template and into the central port. The pump content was aspirated and discarded volume documented. The new medication was slowly infused into the pump, thru the filter, making sure to avoid overpressure of the device. The needle was then removed and the area cleansed, making sure to leave some of the prepping solution back to take advantage of its long term bactericidal properties. The pump was interrogated and programmed to reflect the correct medication, volume, and dosage. The program was printed and taken to the physician for approval. Once checked and signed by the physician, a copy was provided to the patient and another scanned into the EMR. Vitals:   11/12/20 1324 11/12/20 1325 11/12/20 1328  BP:  (!) 220/110 (!) 220/102  Pulse:  74   Resp: 16    Temp: (!) 96.8 F (36 C)    SpO2: 100%    Weight: 170 lb (77.1 kg)    Height: _0  (1.6 m)      Start Time: 1333 hrs. End Time: 1345 hrs. Materials & Medications: Medtronic Refill Kit Medication(s): Please see chart orders for details.  Imaging Guidance:          Type of Imaging Technique: None used Indication(s):  N/A Exposure Time: No patient exposure Contrast: None used. Fluoroscopic Guidance: N/A Ultrasound Guidance: N/A Interpretation: N/A  Antibiotic Prophylaxis:   Anti-infectives (From admission, onward)    None      Indication(s): None identified  Post-operative Assessment:  Post-procedure Vital Signs:  Pulse/HCG Rate: 74  Temp: (!) 96.8 F (36 C) Resp: 16 BP: (!) 220/102 SpO2: 100 %  EBL: None  Complications: No immediate post-treatment complications observed by team, or reported by patient.  Note: The patient tolerated the entire procedure well. A repeat set of vitals were taken after the procedure and the patient was kept under observation following institutional policy, for this type of procedure. Post-procedural neurological assessment was performed, showing return to baseline, prior to discharge. The patient was provided with post-procedure  discharge instructions, including a section on how to identify potential problems. Should any problems arise concerning this procedure, the patient was given instructions to immediately contact us, at any time, without hesitation. In any case, we plan to contact the patient by telephone for a follow-up status report regarding this interventional procedure.  Comments:  No additional relevant information.  Plan of Care  Orders:  Orders Placed This Encounter  Procedures   PUMP REFILL    Maintain Protocol by having two(2) healthcare providers during procedure and programming.    Scheduling Instructions:     Please refill intrathecal pump today.    Order Specific Question:   Where will this procedure be performed?    Answer:   [facility]   PUMP REFILL    Whenever possible schedule on a procedure today.    Standing Status:   Future    Standing Expiration Date:   04/11/2021    Scheduling Instructions:     Please schedule intrathecal pump refill based on pump programming. Avoid schedule intervals of more than 120 days (4  months).    Order Specific Question:   Where will this procedure be performed?    Answer:   [facility]   Informed Consent Details: Physician/Practitioner Attestation; Transcribe to consent form and obtain patient signature    Transcribe to consent form and obtain patient signature.    Order Specific Question:   Physician/Practitioner attestation of informed consent for procedure/surgical case    Answer:   I, the physician/practitioner, attest that I have discussed with the patient the benefits, risks, side effects, alternatives, likelihood of achieving goals and potential problems during recovery for the procedure that I have provided informed consent.    Order Specific Question:   Procedure    Answer:   Intrathecal pump refill    Order Specific Question:   Physician/Practitioner performing the procedure    Answer:   Attending Physician: Kathlen Brunswick. Dossie Arbour, MD & designated trained staff    Order Specific Question:   Indication/Reason    Answer:   Chronic Pain Syndrome (G89.4), presence of an intrathecal pump (Z97.8)   Chronic Opioid Analgesic:  Morphine (MS Contin) 30 mg PO BID (60 mg/day of morphine) (60 MME/day) +  PF-(intrathecal)-Fentanyl (13.4 mcg/hr)  changed from Oxycodone IR 10 mg 4x/day (40 mg/day of oxycodone) (60 MME/day), due to national shortage MME/day: 60 MME/day (oral) + 32.16 MME/day (intrathecal) = 93.16 MME/day (Aprox. 1.16 MME/day/kg).   Medications ordered for procedure: Meds ordered this encounter  Medications   morphine (MS CONTIN) 30 MG 12 hr tablet    Sig: Take 1 tablet (30 mg total) by mouth every 12 (twelve) hours. Must last 30 days. Do not break tablet    Dispense:  60 tablet    Refill:  0    Chronic Pain: STOP Act (Not applicable) Fill 1 day early if closed on refill date. Avoid benzodiazepines within 8 hours of opioids   morphine (MS CONTIN) 30 MG 12 hr tablet    Sig: Take 1 tablet (30 mg total) by mouth every 12 (twelve) hours. Must last 30 days.  Do not break tablet    Dispense:  60 tablet    Refill:  0    Chronic Pain: STOP Act (Not applicable) Fill 1 day early if closed on refill date. Avoid benzodiazepines within 8 hours of opioids   morphine (MS CONTIN) 30 MG 12 hr tablet    Sig: Take 1 tablet (30 mg total) by mouth every  12 (twelve) hours. Must last 30 days. Do not break tablet    Dispense:  60 tablet    Refill:  0    Chronic Pain: STOP Act (Not applicable) Fill 1 day early if closed on refill date. Avoid benzodiazepines within 8 hours of opioids   Medications administered: Hassan Rowan A. Buckles had no medications administered during this visit.  See the medical record for exact dosing, route, and time of administration.  Follow-up plan:   Return for Pump Refill (Max:14mo.       Interventional management options: Planned, scheduled, and/or pending:   Intrathecal pump refill as per pump programming.   Under consideration:   Diagnostic left IA knee joint inj  Diagnostic bilateral L2 TFESI  Diagnostic left genicular NB  Possible left genicular nerve RFA  Diagnostic caudal ESI + epidurogram  Possible Racz procedure  Diagnostic bilateral lumbar facet block  Possible bilateral lumbar facet RFA  (unlikely due to extensive lumbar fusion from L3-S1)    Therapeutic/palliative (PRN):   Therapeutic/palliative intrathecal pump  management (analysis and refill)     Recent Visits Date Type Provider Dept  08/15/20 Procedure visit NMilinda Pointer MD [facility]  Showing recent visits within past 90 days and meeting all other requirements Today's Visits Date Type Provider Dept  11/12/20 Procedure visit NMilinda Pointer MD [facility]  Showing today's visits and meeting all other requirements Future Appointments No visits were found meeting these conditions. Showing future appointments within next 90 days and meeting all other requirements  Disposition: Discharge home  Discharge (Date  Time):  11/12/2020; 1400 hrs.   Primary Care Physician: JBerkley Harvey NP [facility] Note by: FGaspar Cola MD Date: 11/12/2020; Time: 1:53 PM  Disclaimer:  Medicine is not an eChief Strategy Officer The only guarantee in medicine is that nothing is guaranteed. It is important to note that the decision to proceed with this intervention was based on the information collected from the patient. The Data and conclusions were drawn from the patient's questionnaire, the interview, and the physical examination. Because the information was provided in large part by the patient, it cannot be guaranteed that it has not been purposely or unconsciously manipulated. Every effort has been made to obtain as much relevant data as possible for this evaluation. It is important to note that the conclusions that lead to this procedure are derived in large part from the available data. Always take into account that the treatment will also be dependent on availability of resources and existing treatment guidelines, considered by other Pain Management Practitioners as being common knowledge and practice, at the time of the intervention. For Medico-Legal purposes, it is also important to point out that variation in procedural techniques and pharmacological choices are the acceptable norm. The indications, contraindications, technique, and results of the above procedure should only be interpreted and judged by a Board-Certified Interventional Pain Specialist with extensive familiarity and expertise in the same exact procedure and technique.

## 2020-11-12 ENCOUNTER — Other Ambulatory Visit: Payer: Self-pay

## 2020-11-12 ENCOUNTER — Encounter: Payer: Self-pay | Admitting: Pain Medicine

## 2020-11-12 ENCOUNTER — Ambulatory Visit: Payer: Medicare HMO | Attending: Pain Medicine | Admitting: Pain Medicine

## 2020-11-12 VITALS — BP 220/102 | HR 74 | Temp 96.8°F | Resp 16 | Ht 63.0 in | Wt 170.0 lb

## 2020-11-12 DIAGNOSIS — M5442 Lumbago with sciatica, left side: Secondary | ICD-10-CM | POA: Insufficient documentation

## 2020-11-12 DIAGNOSIS — M5441 Lumbago with sciatica, right side: Secondary | ICD-10-CM | POA: Diagnosis not present

## 2020-11-12 DIAGNOSIS — F1120 Opioid dependence, uncomplicated: Secondary | ICD-10-CM

## 2020-11-12 DIAGNOSIS — M79605 Pain in left leg: Secondary | ICD-10-CM | POA: Diagnosis not present

## 2020-11-12 DIAGNOSIS — R69 Illness, unspecified: Secondary | ICD-10-CM | POA: Diagnosis not present

## 2020-11-12 DIAGNOSIS — M5136 Other intervertebral disc degeneration, lumbar region: Secondary | ICD-10-CM | POA: Diagnosis not present

## 2020-11-12 DIAGNOSIS — Z451 Encounter for adjustment and management of infusion pump: Secondary | ICD-10-CM

## 2020-11-12 DIAGNOSIS — Z978 Presence of other specified devices: Secondary | ICD-10-CM | POA: Diagnosis not present

## 2020-11-12 DIAGNOSIS — M961 Postlaminectomy syndrome, not elsewhere classified: Secondary | ICD-10-CM

## 2020-11-12 DIAGNOSIS — M79604 Pain in right leg: Secondary | ICD-10-CM | POA: Diagnosis not present

## 2020-11-12 DIAGNOSIS — M5417 Radiculopathy, lumbosacral region: Secondary | ICD-10-CM | POA: Diagnosis not present

## 2020-11-12 DIAGNOSIS — G8929 Other chronic pain: Secondary | ICD-10-CM

## 2020-11-12 DIAGNOSIS — Z79899 Other long term (current) drug therapy: Secondary | ICD-10-CM | POA: Diagnosis not present

## 2020-11-12 DIAGNOSIS — M51369 Other intervertebral disc degeneration, lumbar region without mention of lumbar back pain or lower extremity pain: Secondary | ICD-10-CM

## 2020-11-12 DIAGNOSIS — G894 Chronic pain syndrome: Secondary | ICD-10-CM | POA: Diagnosis not present

## 2020-11-12 MED ORDER — MORPHINE SULFATE ER 30 MG PO TBCR: 30.0000 mg | EXTENDED_RELEASE_TABLET | Freq: Two times a day (BID) | ORAL | 0 refills | Status: DC

## 2020-11-12 NOTE — Patient Instructions (Addendum)
____________________________________________________________________________________________  CBD (cannabidiol) WARNING  Applicable to: All individuals currently taking or considering taking CBD (cannabidiol) and, more important, all patients taking opioid analgesic controlled substances (pain medication). (Example: oxycodone; oxymorphone; hydrocodone; hydromorphone; morphine; methadone; tramadol; tapentadol; fentanyl; buprenorphine; butorphanol; dextromethorphan; meperidine; codeine; etc.)  Legal status: CBD remains a Schedule I drug prohibited for any use. CBD is illegal with one exception. In the [place], CBD has a limited Food and Drug Administration (FDA) approval for the treatment of two specific types of epilepsy disorders. Only one CBD product has been approved by the FDA for this purpose: "Epidiolex". FDA is aware that some companies are marketing products containing cannabis and cannabis-derived compounds in ways that violate the Federal Food, Drug and Cosmetic Act (FD&C Act) and that may put the health and safety of consumers at risk. The FDA, a Federal agency, has not enforced the CBD status since 2018.   Legality: Some manufacturers ship CBD products nationally, which is illegal. Often such products are sold online and are therefore available throughout the country. CBD is openly sold in head shops and health food stores in some states where such sales have not been explicitly legalized. Selling unapproved products with unsubstantiated therapeutic claims is not only a violation of the law, but also can put patients at risk, as these products have not been proven to be safe or effective. Federal illegality makes it difficult to conduct research on CBD.  Reference: "FDA Regulation of Cannabis and Cannabis-Derived Products, Including Cannabidiol (CBD)" -  https://www.fda.gov/news-events/public-health-focus/fda-regulation-cannabis-and-cannabis-derived-products-including-cannabidiol-cbd  Warning: CBD is not FDA approved and has not undergo the same manufacturing controls as prescription drugs.  This means that the purity and safety of available CBD may be questionable. Most of the time, despite manufacturer's claims, it is contaminated with THC (delta-9-tetrahydrocannabinol - the chemical in marijuana responsible for the "HIGH").  When this is the case, the THC contaminant will trigger a positive urine drug screen (UDS) test for Marijuana (carboxy-THC). Because a positive UDS for any illicit substance is a violation of our medication agreement, your opioid analgesics (pain medicine) may be permanently discontinued.  MORE ABOUT CBD  General Information: CBD  is a derivative of the Marijuana (cannabis sativa) plant discovered in 1940. It is one of the 113 identified substances found in Marijuana. It accounts for up to 40% of the plant's extract. As of 2018, preliminary clinical studies on CBD included research for the treatment of anxiety, movement disorders, and pain. CBD is available and consumed in multiple forms, including inhalation of smoke or vapor, as an aerosol spray, and by mouth. It may be supplied as an oil containing CBD, capsules, dried cannabis, or as a liquid solution. CBD is thought not to be as psychoactive as THC (delta-9-tetrahydrocannabinol - the chemical in marijuana responsible for the "HIGH"). Studies suggest that CBD may interact with different biological target receptors in the body, including cannabinoid and other neurotransmitter receptors. As of 2018 the mechanism of action for its biological effects has not been determined.  Side-effects  Adverse reactions: Dry mouth, diarrhea, decreased appetite, fatigue, drowsiness, malaise, weakness, sleep disturbances, and others.  Drug interactions: CBC may interact with other medications  such as blood-thinners. (Last update: 04/06/2020) ____________________________________________________________________________________________   ____________________________________________________________________________________________  Medication Rules  Purpose: To inform patients, and their family members, of our rules and regulations.  Applies to: All patients receiving prescriptions (written or electronic).  Pharmacy of record: Pharmacy where electronic prescriptions will be sent. If written prescriptions are taken to a different pharmacy, please inform   the nursing staff. The pharmacy listed in the electronic medical record should be the one where you would like electronic prescriptions to be sent.  Electronic prescriptions: In compliance with the [place] (STOP) Act of 2017 (Session Lanny Cramp [id number]), effective August 31, 2018, all controlled substances must be electronically prescribed. Calling prescriptions to the pharmacy will cease to exist.  Prescription refills: Only during scheduled appointments. Applies to all prescriptions.  NOTE: The following applies primarily to controlled substances (Opioid* Pain Medications).   Type of encounter (visit): For patients receiving controlled substances, face-to-face visits are required. (Not an option or up to the patient.)  Patient's responsibilities: 1. Pain Pills: Bring all pain pills to every appointment (except for procedure appointments). 2. Pill Bottles: Bring pills in original pharmacy bottle. Always bring the newest bottle. Bring bottle, even if empty. 3. Medication refills: You are responsible for knowing and keeping track of what medications you take and those you need refilled. The day before your appointment: write a list of all prescriptions that need to be refilled. The day of the appointment: give the list to the admitting nurse. Prescriptions will be written only during  appointments. No prescriptions will be written on procedure days. If you forget a medication: it will not be "Called in", "Faxed", or "electronically sent". You will need to get another appointment to get these prescribed. No early refills. Do not call asking to have your prescription filled early. 4. Prescription Accuracy: You are responsible for carefully inspecting your prescriptions before leaving our office. Have the discharge nurse carefully go over each prescription with you, before taking them home. Make sure that your name is accurately spelled, that your address is correct. Check the name and dose of your medication to make sure it is accurate. Check the number of pills, and the written instructions to make sure they are clear and accurate. Make sure that you are given enough medication to last until your next medication refill appointment. 5. Taking Medication: Take medication as prescribed. When it comes to controlled substances, taking less pills or less frequently than prescribed is permitted and encouraged. Never take more pills than instructed. Never take medication more frequently than prescribed.  6. Inform other Doctors: Always inform, all of your healthcare providers, of all the medications you take. 7. Pain Medication from other Providers: You are not allowed to accept any additional pain medication from any other Doctor or Healthcare provider. There are two exceptions to this rule. (see below) In the event that you require additional pain medication, you are responsible for notifying us, as stated below. 8. Cough Medicine: Often these contain an opioid, such as codeine or hydrocodone. Never accept or take cough medicine containing these opioids if you are already taking an opioid* medication. The combination may cause respiratory failure and death. 9. Medication Agreement: You are responsible for carefully reading and following our Medication Agreement. This must be signed before  receiving any prescriptions from our practice. Safely store a copy of your signed Agreement. Violations to the Agreement will result in no further prescriptions. (Additional copies of our Medication Agreement are available upon request.) 10. Laws, Rules, & Regulations: All patients are expected to follow all Federal and [employer], [employer], Rules, [employer] [facility]. Ignorance of the Laws does not constitute a valid excuse.  11. Illegal drugs and Controlled Substances: The use of illegal substances (including, but not limited to marijuana and its derivatives) and/or the illegal use of any controlled substances is strictly prohibited.  Violation of this rule may result in the immediate and permanent discontinuation of any and all prescriptions being written by our practice. The use of any illegal substances is prohibited. 12. Adopted CDC guidelines & recommendations: Target dosing levels will be at or below 60 MME/day. Use of benzodiazepines** is not recommended.  Exceptions: There are only two exceptions to the rule of not receiving pain medications from other Healthcare Providers. 1. Exception #1 (Emergencies): In the event of an emergency (i.e.: accident requiring emergency care), you are allowed to receive additional pain medication. However, you are responsible for: As soon as you are able, call our office [phone], at any time of the day or night, and leave a message stating your name, the date and nature of the emergency, and the name and dose of the medication prescribed. In the event that your call is answered by a member of our staff, make sure to document and save the date, time, and the name of the person that took your information.  2. Exception #2 (Planned Surgery): In the event that you are scheduled by another doctor or dentist to have any type of surgery or procedure, you are allowed (for a period no longer than 30 days), to receive additional pain medication, for the acute post-op pain.  However, in this case, you are responsible for picking up a copy of our "Post-op Pain Management for Surgeons" handout, and giving it to your surgeon or dentist. This document is available at our office, and does not require an appointment to obtain it. Simply go to our office during business hours (Monday-Thursday from 8:00 AM to 4:00 PM) (Friday 8:00 AM to 12:00 Noon) or if you have a scheduled appointment with [place], prior to your surgery, and ask for it by name. In addition, you are responsible for: calling our office [phone], at any time of the day or night, and leaving a message stating your name, name of your surgeon, type of surgery, and date of procedure or surgery. Failure to comply with your responsibilities may result in termination of therapy involving the controlled substances.  *Opioid medications include: morphine, codeine, oxycodone, oxymorphone, hydrocodone, hydromorphone, meperidine, tramadol, tapentadol, buprenorphine, fentanyl, methadone. **Benzodiazepine medications include: diazepam (Valium), alprazolam (Xanax), clonazepam (Klonopine), lorazepam (Ativan), clorazepate (Tranxene), chlordiazepoxide (Librium), estazolam (Prosom), oxazepam (Serax), temazepam (Restoril), triazolam (Halcion) (Last updated: 07/29/2020) ____________________________________________________________________________________________   ____________________________________________________________________________________________  Medication Recommendations and Reminders  Applies to: All patients receiving prescriptions (written and/or electronic).  Medication Rules & Regulations: These rules and regulations exist for your safety and that of others. They are not flexible and neither are we. Dismissing or ignoring them will be considered "non-compliance" with medication therapy, resulting in complete and irreversible termination of such therapy. (See document titled "Medication Rules" for more details.) In all  conscience, because of safety reasons, we cannot continue providing a therapy where the patient does not follow instructions.  Pharmacy of record:   Definition: This is the pharmacy where your electronic prescriptions will be sent.   We do not endorse any particular pharmacy, however, we have experienced problems with [facility] not securing enough medication supply for the community.  We do not restrict you in your choice of pharmacy. However, once we write for your prescriptions, we will NOT be re-sending more prescriptions to fix restricted supply problems created by your pharmacy, or your insurance.   The pharmacy listed in the electronic medical record should be the one where you want electronic prescriptions to be sent.  If you  choose to change pharmacy, simply notify our nursing staff.  Recommendations:  Keep all of your pain medications in a safe place, under lock and key, even if you live alone. We will NOT replace lost, stolen, or damaged medication.  After you fill your prescription, take 1 week's worth of pills and put them away in a safe place. You should keep a separate, properly labeled bottle for this purpose. The remainder should be kept in the original bottle. Use this as your primary supply, until it runs out. Once it's gone, then you know that you have 1 week's worth of medicine, and it is time to come in for a prescription refill. If you do this correctly, it is unlikely that you will ever run out of medicine.  To make sure that the above recommendation works, it is very important that you make sure your medication refill appointments are scheduled at least 1 week before you run out of medicine. To do this in an effective manner, make sure that you do not leave the office without scheduling your next medication management appointment. Always ask the nursing staff to show you in your prescription , when your medication will be running out. Then arrange for the receptionist to  get you a return appointment, at least 7 days before you run out of medicine. Do not wait until you have 1 or 2 pills left, to come in. This is very poor planning and does not take into consideration that we may need to cancel appointments due to bad weather, sickness, or emergencies affecting our staff.  DO NOT ACCEPT A "Partial Fill": If for any reason your pharmacy does not have enough pills/tablets to completely fill or refill your prescription, do not allow for a "partial fill". The law allows the pharmacy to complete that prescription within 72 hours, without requiring a new prescription. If they do not fill the rest of your prescription within those 72 hours, you will need a separate prescription to fill the remaining amount, which we will NOT provide. If the reason for the partial fill is your insurance, you will need to talk to the pharmacist about payment alternatives for the remaining tablets, but again, DO NOT ACCEPT A PARTIAL FILL, unless you can trust your pharmacist to obtain the remainder of the pills within 72 hours.  Prescription refills and/or changes in medication(s):   Prescription refills, and/or changes in dose or medication, will be conducted only during scheduled medication management appointments. (Applies to both, written and electronic prescriptions.)  No refills on procedure days. No medication will be changed or started on procedure days. No changes, adjustments, and/or refills will be conducted on a procedure day. Doing so will interfere with the diagnostic portion of the procedure.  No phone refills. No medications will be "called into the pharmacy".  No Fax refills.  No weekend refills.  No Holliday refills.  No after hours refills.  Remember:  Business hours are:  Monday to Thursday 8:00 AM to 4:00 PM Provider's Schedule: Delano Metz, MD - Appointments are:  Medication management: Monday and Wednesday 8:00 AM to 4:00 PM Procedure day: Tuesday and  Thursday 7:30 AM to 4:00 PM Edward Jolly, MD - Appointments are:  Medication management: Tuesday and Thursday 8:00 AM to 4:00 PM Procedure day: Monday and Wednesday 7:30 AM to 4:00 PM (Last update: 03/20/2020) ____________________________________________________________________________________________   ____________________________________________________________________________________________  Drug Holidays (Slow)  What is a "Drug Holiday"? Drug Holiday: is the name given to the period of time during  which a patient stops taking a medication(s) for the purpose of eliminating tolerance to the drug.  Benefits . Improved effectiveness of opioids. . Decreased opioid dose needed to achieve benefits. . Improved pain with lesser dose.  What is tolerance? Tolerance: is the progressive decreased in effectiveness of a drug due to its repetitive use. With repetitive use, the body gets use to the medication and as a consequence, it loses its effectiveness. This is a common problem seen with opioid pain medications. As a result, a larger dose of the drug is needed to achieve the same effect that used to be obtained with a smaller dose.  How long should a "Drug Holiday" last? You should stay off of the pain medicine for at least 14 consecutive days. (2 weeks)  Should I stop the medicine "cold [place]"? No. You should always coordinate with your Pain Specialist so that he/she can provide you with the correct medication dose to make the transition as smoothly as possible.  How do I stop the medicine? Slowly. You will be instructed to decrease the daily amount of pills that you take by one (1) pill every seven (7) days. This is called a "slow downward taper" of your dose. For example: if you normally take four (4) pills per day, you will be asked to drop this dose to three (3) pills per day for seven (7) days, then to two (2) pills per day for seven (7) days, then to one (1) per day for seven (7) days,  and at the end of those last seven (7) days, this is when the "Drug Holiday" would start.   Will I have withdrawals? By doing a "slow downward taper" like this one, it is unlikely that you will experience any significant withdrawal symptoms. Typically, what triggers withdrawals is the sudden stop of a high dose opioid therapy. Withdrawals can usually be avoided by slowly decreasing the dose over a prolonged period of time. If you do not follow these instructions and decide to stop your medication abruptly, withdrawals may be possible.  What are withdrawals? Withdrawals: refers to the wide range of symptoms that occur after stopping or dramatically reducing opiate drugs after heavy and prolonged use. Withdrawal symptoms do not occur to patients that use low dose opioids, or those who take the medication sporadically. Contrary to benzodiazepine (example: Valium, Xanax, etc.) or alcohol withdrawals ("Delirium Tremens"), opioid withdrawals are not lethal. Withdrawals are the physical manifestation of the body getting rid of the excess receptors.  Expected Symptoms Early symptoms of withdrawal may include: . Agitation . Anxiety . Muscle aches . Increased tearing . Insomnia . Runny nose . Sweating . Yawning  Late symptoms of withdrawal may include: . Abdominal cramping . Diarrhea . Dilated pupils . Goose bumps . Nausea . Vomiting  Will I experience withdrawals? Due to the slow nature of the taper, it is very unlikely that you will experience any.  What is a slow taper? Taper: refers to the gradual decrease in dose.  (Last update: 03/20/2020) ____________________________________________________________________________________________     Opioid Overdose Opioids are drugs that are often used to treat pain. Opioids include illegal drugs, such as heroin, as well as prescription pain medicines, such as codeine, morphine, hydrocodone, oxycodone, and fentanyl. An opioid overdose happens  when you take too much of an opioid. An overdose may be intentional or accidental and can happen with any type of opioid. The effects of an overdose can be mild, dangerous, or even deadly. Opioid overdose is a  medical emergency. What are the causes? This condition may be caused by:  Taking too much of an opioid on purpose.  Taking too much of an opioid by accident.  Using two or more substances that contain opioids at the same time.  Taking an opioid with a substance that affects your heart, breathing, or blood pressure. These include alcohol, tranquilizers, sleeping pills, illegal drugs, and some over-the-counter medicines. This condition may also happen due to an error made by:  A health care provider who prescribes a medicine.  The pharmacist who fills the prescription order. What increases the risk? This condition is more likely in:  Children. They may be attracted to colorful pills. Because of a child's small size, even a small amount of a drug can be dangerous.  Older people. They may be taking many different drugs. Older people may have difficulty reading labels or remembering when they last took their medicine. They may also be more sensitive to the effects of opioids.  People with chronic medical conditions, especially heart, liver, kidney, or neurological diseases.  People who take an opioid for a long period of time.  People who use: ? Illegal drugs. IV heroin is especially dangerous. ? Other substances, including alcohol, while using an opioid.  People who have: ? A history of drug or alcohol abuse. ? Certain mental health conditions. ? A history of previous drug overdoses.  People who take opioids that are not prescribed for them. What are the signs or symptoms? Symptoms of this condition depend on the type of opioid and the amount that was taken. Common symptoms include:  Sleepiness or difficulty waking from sleep.  Decrease in  attention.  Confusion.  Slurred speech.  Slowed breathing and a slow pulse (bradycardia).  Nausea and vomiting.  Abnormally small pupils. Signs and symptoms that require emergency treatment include:  Cold, clammy, and pale skin.  Blue lips and fingernails.  Vomiting.  Gurgling sounds in the throat.  A pulse that is very slow or difficult to detect.  Breathing that is very irregular, slow, noisy, or difficult to detect.  Limp body.  Inability to respond to speech or be awakened from sleep (stupor).  Seizures. How is this diagnosed? This condition is diagnosed based on your symptoms and medical history. It is important to tell your health care provider:  About all of the opioids that you took.  When you took the opioids.  Whether you were drinking alcohol or using marijuana, cocaine, or other drugs. Your health care provider will do a physical exam. This exam may include:  Checking and monitoring your heart rate and rhythm, breathing rate, temperature, and blood pressure (vital signs).  Measuring oxygen levels in your blood.  Checking for abnormally small pupils. You may also have blood tests or urine tests. You may have X-rays if you are having severe breathing problems. How is this treated? This condition requires immediate medical treatment and hospitalization. Treatment is given in the hospital intensive care (ICU) setting. Supporting your vital signs and your breathing is the first step in treating an opioid overdose. Treatment may also include:  Giving salts and minerals (electrolytes) along with fluids through an IV.  Inserting a breathing tube (endotracheal tube) in your airway to help you breathe if you cannot breathe on your own or you are in danger of not being able to breathe on your own.  Giving oxygen through a small tube under your nose.  Passing a tube through your nose and into your stomach (  nasogastric tube, or NG tube) to empty your  stomach.  Giving medicines that: ? Increase your blood pressure. ? Relieve nausea and vomiting. ? Relieve abdominal pain and cramping. ? Reverse the effects of the opioid (naloxone).  Monitoring your heart and oxygen levels.  Ongoing counseling and mental health support if you intentionally overdosed or used an illegal drug. Follow these instructions at home: Medicines  Take over-the-counter and prescription medicines only as told by your health care provider.  Always ask your health care provider about possible side effects and interactions of any new medicine that you start taking.  Keep a list of all the medicines that you take, including over-the-counter medicines. Bring this list with you to all your medical visits. General instructions  Drink enough fluid to keep your urine pale yellow.  Keep all follow-up visits as told by your health care provider. This is important.   How is this prevented?  Read the drug inserts that come with your opioid pain medicines.  Take medicines only as told by your health care provider. Do not take more medicine than you are told. Do not take medicines more frequently than you are told.  Do not drink alcohol or take sedatives when taking opioids.  Do not use illegal or recreational drugs, including cocaine, ecstasy, and marijuana.  Do not take opioid medicines that are not prescribed for you.  Store all medicines in safety containers that are out of the reach of children.  Get help if you are struggling with: ? Alcohol or drug use. ? Depression or another mental health problem. ? Thoughts of hurting yourself or another person.  Keep the phone number of your local poison control center near your phone or in your mobile phone. In the [place] the hotline of the [facility] is (917) 834-2512.  If you were prescribed naloxone, make sure you understand how to take it. Contact a health care provider if you:  Need help  understanding how to take your pain medicines.  Feel your medicines are too strong.  Are concerned that your pain medicines are not working well for your pain.  Develop new symptoms or side effects when you are taking medicines. Get help right away if:  You or someone else is having symptoms of an opioid overdose. Get help even if you are not sure.  You have serious thoughts about hurting yourself or others.  You have: ? Chest pain. ? Difficulty breathing. ? A loss of consciousness. These symptoms may represent a serious problem that is an emergency. Do not wait to see if the symptoms will go away. Get medical help right away. Call your local emergency services (911 in the [place] Do not drive yourself to the hospital. If you ever feel like you may hurt yourself or others, or have thoughts about taking your own life, get help right away. You can go to your nearest emergency department or call:  Your local emergency services (911 in the [place]  A suicide crisis helpline, such as the National Suicide Prevention Lifeline at 503 289 1394. This is open 24 hours a day. Summary  Opioids are drugs that are often used to treat pain. Opioids include illegal drugs, such as heroin, as well as prescription pain medicines.  An opioid overdose happens when you take too much of an opioid.  Overdoses can be intentional or accidental.  Opioid overdose is very dangerous. It is a life-threatening emergency.  If you or someone you know is experiencing an opioid  overdose, get help right away. This information is not intended to replace advice given to you by your health care provider. Make sure you discuss any questions you have with your health care provider. Document Revised: 08/04/2018 Document Reviewed: 08/04/2018 Elsevier Patient Education  2021 [employer].

## 2020-11-12 NOTE — Progress Notes (Signed)
Nursing Pain Medication Assessment:  Safety precautions to be maintained throughout the outpatient stay will include: orient to surroundings, keep bed in low position, maintain call bell within reach at all times, provide assistance with transfer out of bed and ambulation.  Medication Inspection Compliance: Pill count conducted under aseptic conditions, in front of the patient. Neither the pills nor the bottle was removed from the patient's sight at any time. Once count was completed pills were immediately returned to the patient in their original bottle.  Medication: Morphine ER (MSContin) Pill/Patch Count: 15 of 60 pills remain Pill/Patch Appearance: Markings consistent with prescribed medication Bottle Appearance: Standard pharmacy container. Clearly labeled. Filled Date:02 / 21 / 2022 Last Medication intake:  Today

## 2020-11-13 ENCOUNTER — Telehealth: Payer: Self-pay

## 2020-11-13 NOTE — Telephone Encounter (Signed)
Post procedure follow up phone call.  LM 

## 2020-11-14 MED FILL — Medication: INTRATHECAL | Qty: 1 | Status: AC

## 2020-11-18 DIAGNOSIS — Z1321 Encounter for screening for nutritional disorder: Secondary | ICD-10-CM | POA: Diagnosis not present

## 2020-11-18 DIAGNOSIS — Z1231 Encounter for screening mammogram for malignant neoplasm of breast: Secondary | ICD-10-CM | POA: Diagnosis not present

## 2020-11-18 DIAGNOSIS — R69 Illness, unspecified: Secondary | ICD-10-CM | POA: Diagnosis not present

## 2020-11-18 DIAGNOSIS — Z1329 Encounter for screening for other suspected endocrine disorder: Secondary | ICD-10-CM | POA: Diagnosis not present

## 2020-11-18 DIAGNOSIS — S65311D Laceration of deep palmar arch of right hand, subsequent encounter: Secondary | ICD-10-CM | POA: Diagnosis not present

## 2020-11-18 DIAGNOSIS — X58XXXD Exposure to other specified factors, subsequent encounter: Secondary | ICD-10-CM | POA: Diagnosis not present

## 2020-11-18 DIAGNOSIS — S65311A Laceration of deep palmar arch of right hand, initial encounter: Secondary | ICD-10-CM | POA: Diagnosis not present

## 2020-11-18 DIAGNOSIS — I10 Essential (primary) hypertension: Secondary | ICD-10-CM | POA: Diagnosis not present

## 2020-11-18 DIAGNOSIS — Z4802 Encounter for removal of sutures: Secondary | ICD-10-CM | POA: Diagnosis not present

## 2020-11-18 DIAGNOSIS — F17210 Nicotine dependence, cigarettes, uncomplicated: Secondary | ICD-10-CM | POA: Diagnosis not present

## 2020-11-18 DIAGNOSIS — Z1211 Encounter for screening for malignant neoplasm of colon: Secondary | ICD-10-CM | POA: Diagnosis not present

## 2020-11-18 DIAGNOSIS — Z13220 Encounter for screening for lipoid disorders: Secondary | ICD-10-CM | POA: Diagnosis not present

## 2020-11-18 DIAGNOSIS — R001 Bradycardia, unspecified: Secondary | ICD-10-CM | POA: Diagnosis not present

## 2020-11-18 DIAGNOSIS — R5383 Other fatigue: Secondary | ICD-10-CM | POA: Diagnosis not present

## 2020-12-18 ENCOUNTER — Inpatient Hospital Stay (HOSPITAL_COMMUNITY)
Admission: EM | Admit: 2020-12-18 | Discharge: 2020-12-20 | DRG: 464 | Disposition: A | Discharge status: Home Health | Payer: Medicare HMO | Attending: Orthopedic Surgery | Admitting: Orthopedic Surgery

## 2020-12-18 ENCOUNTER — Emergency Department (HOSPITAL_COMMUNITY): Payer: Medicare HMO

## 2020-12-18 ENCOUNTER — Other Ambulatory Visit: Payer: Self-pay

## 2020-12-18 DIAGNOSIS — I10 Essential (primary) hypertension: Secondary | ICD-10-CM | POA: Diagnosis not present

## 2020-12-18 DIAGNOSIS — G894 Chronic pain syndrome: Secondary | ICD-10-CM | POA: Diagnosis present

## 2020-12-18 DIAGNOSIS — S83105A Unspecified dislocation of left knee, initial encounter: Secondary | ICD-10-CM | POA: Diagnosis not present

## 2020-12-18 DIAGNOSIS — Z87891 Personal history of nicotine dependence: Secondary | ICD-10-CM

## 2020-12-18 DIAGNOSIS — T84028A Dislocation of other internal joint prosthesis, initial encounter: Secondary | ICD-10-CM | POA: Diagnosis not present

## 2020-12-18 DIAGNOSIS — M5134 Other intervertebral disc degeneration, thoracic region: Secondary | ICD-10-CM | POA: Diagnosis present

## 2020-12-18 DIAGNOSIS — I472 Ventricular tachycardia: Secondary | ICD-10-CM | POA: Diagnosis not present

## 2020-12-18 DIAGNOSIS — D62 Acute posthemorrhagic anemia: Secondary | ICD-10-CM | POA: Diagnosis not present

## 2020-12-18 DIAGNOSIS — M5136 Other intervertebral disc degeneration, lumbar region: Secondary | ICD-10-CM | POA: Diagnosis present

## 2020-12-18 DIAGNOSIS — Z888 Allergy status to other drugs, medicaments and biological substances status: Secondary | ICD-10-CM

## 2020-12-18 DIAGNOSIS — Z96652 Presence of left artificial knee joint: Secondary | ICD-10-CM

## 2020-12-18 DIAGNOSIS — T84021A Dislocation of internal left hip prosthesis, initial encounter: Secondary | ICD-10-CM | POA: Diagnosis not present

## 2020-12-18 DIAGNOSIS — Z79899 Other long term (current) drug therapy: Secondary | ICD-10-CM

## 2020-12-18 DIAGNOSIS — Z471 Aftercare following joint replacement surgery: Secondary | ICD-10-CM | POA: Diagnosis not present

## 2020-12-18 DIAGNOSIS — W19XXXA Unspecified fall, initial encounter: Secondary | ICD-10-CM | POA: Diagnosis not present

## 2020-12-18 DIAGNOSIS — M25562 Pain in left knee: Secondary | ICD-10-CM | POA: Diagnosis not present

## 2020-12-18 DIAGNOSIS — E7800 Pure hypercholesterolemia, unspecified: Secondary | ICD-10-CM | POA: Diagnosis present

## 2020-12-18 DIAGNOSIS — G2581 Restless legs syndrome: Secondary | ICD-10-CM | POA: Diagnosis present

## 2020-12-18 DIAGNOSIS — I4892 Unspecified atrial flutter: Secondary | ICD-10-CM | POA: Diagnosis not present

## 2020-12-18 DIAGNOSIS — I493 Ventricular premature depolarization: Secondary | ICD-10-CM | POA: Diagnosis not present

## 2020-12-18 DIAGNOSIS — G8929 Other chronic pain: Secondary | ICD-10-CM | POA: Diagnosis present

## 2020-12-18 DIAGNOSIS — Y793 Surgical instruments, materials and orthopedic devices (including sutures) associated with adverse incidents: Secondary | ICD-10-CM | POA: Diagnosis present

## 2020-12-18 DIAGNOSIS — T84023A Instability of internal left knee prosthesis, initial encounter: Secondary | ICD-10-CM | POA: Diagnosis not present

## 2020-12-18 DIAGNOSIS — I480 Paroxysmal atrial fibrillation: Secondary | ICD-10-CM | POA: Diagnosis not present

## 2020-12-18 DIAGNOSIS — E785 Hyperlipidemia, unspecified: Secondary | ICD-10-CM | POA: Diagnosis present

## 2020-12-18 DIAGNOSIS — Z20822 Contact with and (suspected) exposure to covid-19: Secondary | ICD-10-CM | POA: Diagnosis present

## 2020-12-18 DIAGNOSIS — S80919A Unspecified superficial injury of unspecified knee, initial encounter: Secondary | ICD-10-CM | POA: Diagnosis not present

## 2020-12-18 DIAGNOSIS — M5417 Radiculopathy, lumbosacral region: Secondary | ICD-10-CM | POA: Diagnosis present

## 2020-12-18 DIAGNOSIS — R609 Edema, unspecified: Secondary | ICD-10-CM | POA: Diagnosis not present

## 2020-12-18 DIAGNOSIS — Z91030 Bee allergy status: Secondary | ICD-10-CM

## 2020-12-18 DIAGNOSIS — Z833 Family history of diabetes mellitus: Secondary | ICD-10-CM

## 2020-12-18 MED ORDER — HYDROMORPHONE HCL 1 MG/ML IJ SOLN
1.0000 mg | Freq: Once | INTRAMUSCULAR | Status: AC
Start: 2020-12-18 — End: 2020-12-18
  Administered 2020-12-18: 1 mg via INTRAVENOUS
  Filled 2020-12-18: qty 1

## 2020-12-18 NOTE — ED Triage Notes (Signed)
Pt from home sitting on floor with legs crossed [place] style and when attempting to get up felt shooting pain to knee and then noticed deformity Hx knee replacement affected leg 4 yrs ago/

## 2020-12-18 NOTE — ED Provider Notes (Addendum)
[facility]-EMERGENCY DEPT Provider Note   CSN: [id number] Arrival date & time: 12/18/20  2137     History Chief Complaint  Patient presents with  . Knee Injury    Left knee dislocation    Kendra Nelson is a 60 y.o. female.  The history is provided by the patient and medical records.    60 y.o. F with hx of anxiety, arthritis, chronic pain, restless legs, HLP, HTN, presenting to the ED for left knee pain/deformity.  States she was sitting on the floor cross legged and went to straight her legs and stand up but leg would not fully straighten and she fell to the floor.  No head injury or LOC.  States she is not able to move her leg at all without severe pain.  Had knee replacement in this leg 4 years ago by Dr. Turner Daniels, no real issues since that time.  Last PO intake around dinner time.  Past Medical History:  Diagnosis Date  . Anxiety    off of valium- stress  . Arthritis    left knee- rhumatoid- hasnt seen specialist   . Chronic lower back pain   . Complication of anesthesia    WOKE DURING BACK SURGERY ; urinary retention 12/20/2017  . Hyperlipidemia   . Hypertension   . Migraine    "when seasons change" (12/20/2017)  . Neuromuscular disorder (HCC)   . Restless leg syndrome   . Seasonal allergies   . Skin abnormalities   . Wears glasses     Patient Active Problem List   Diagnosis Date Noted  . Uncomplicated opioid dependence (HCC) 08/15/2020  . Weight gain 03/13/2019  . End of battery life of intrathecal infusion pump 12/13/2018  . Primary osteoarthritis of left knee 12/20/2017  . Degenerative arthritis of left knee 12/16/2017  . 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 Effusion of knee (Left) 09/29/2017  . DDD (degenerative disc disease), lumbar 09/29/2017  . Lumbar intervertebral disc protrusion (L2-3) 09/29/2017  . Restless legs syndrome 07/11/2017  . Lumbar facet syndrome (Bilateral) (R>L) 06/16/2017  . Drug-related hair  loss 05/31/2017  . Screening for cervical cancer 05/30/2017  . Neurogenic pain 05/26/2017  . Neuropathic pain 05/26/2017  . Chronic musculoskeletal pain 05/26/2017  . Presence of intrathecal pump 05/25/2017  . Adjustment and management of infusion pump 05/25/2017  . Disorder of skeletal system 05/25/2017  . Chronic pain syndrome 05/25/2017  . Problems influencing health status 05/25/2017  . 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 Chronic low back pain (Primary Area of Pain) (Bilateral) (R>L) 05/25/2017  . Chronic lower extremity pain (Secondary Area of Pain) (Bilateral) (R>L) 05/25/2017  . Long term prescription opiate use 05/25/2017  . Opiate use (93.16 MME/day) 05/25/2017  . Cigarette nicotine dependence without complication 11/24/2016  . Psoriasis 11/24/2016  . Need for influenza vaccination 08/26/2016  . 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  . Battery end of life of spinal cord stimulator 06/04/2016  . Pure hypercholesterolemia 02/06/2016  . Need for hepatitis C screening test 02/03/2016  . Palpitations 02/03/2016  . Pharmacologic therapy 02/03/2016  . Chronic knee pain (Left) 01/01/2016  . Adjustment disorder with anxiety 04/02/2015  . Chronic lumbosacral radiculopathy (L5) (Left) 04/02/2015  . Primary insomnia 02/18/2015  . Chronic back pain greater than 3 months duration 11/12/2014  . Agoraphobia with panic disorder 05/23/2014  . Muscle spasm of back 09/11/2013  . Benign essential hypertension 11/06/2011  . Unspecified constipation 10/05/2007  .  Abnormal weight gain 10/05/2007  . Opioid-induced constipation (OIC) 02/10/2007    Past Surgical History:  Procedure Laterality Date  . BACK SURGERY     8-9 SURGERIES; lower back  . COLONOSCOPY    . INTRATHECAL PUMP REVISION N/A 05/09/2019   Procedure: Intrathecal pump change;  Surgeon: Odette Fraction, MD;  Location: [facility] OR;  Service: Neurosurgery;  Laterality: N/A;  Intrathecal pump change  .  JOINT REPLACEMENT    . PAIN PUMP IMPLANTATION  05/10/2012   Procedure: PAIN PUMP INSERTION;  Surgeon: Maeola Harman, MD;  Location: [facility];  Service: Neurosurgery;  Laterality: N/A;  Pump replacement  . SPINAL CORD STIMULATOR BATTERY EXCHANGE N/A 06/09/2016   Procedure: IMPLANTABLE PULSE GENERATOR REPLACEMENT WITH RECHARGEABLE BATTERY;  Surgeon: Maeola Harman, MD;  Location[facility] OR;  Service: Neurosurgery;  Laterality: N/A;  . TOTAL KNEE ARTHROPLASTY Left 12/20/2017  . TOTAL KNEE ARTHROPLASTY Left 12/20/2017   Procedure: TOTAL KNEE ARTHROPLASTY;  Surgeon: Gean Birchwood, MD;  Location: [facility] OR;  Service: Orthopedics;  Laterality: Left;  Marland Kitchen VAGINAL HYSTERECTOMY       OB History   No obstetric history on file.     Family History  Problem Relation Age of Onset  . Aortic aneurysm Mother   . Diabetes Maternal Grandmother     Social History   Tobacco Use  . Smoking status: Former Smoker    Packs/day: 1.00    Years: 30.00    Pack years: 30.00    Types: Cigarettes    Quit date: 11/12/2018    Years since quitting: 2.1  . Smokeless tobacco: Never Used  Vaping Use  . Vaping Use: Former  Substance Use Topics  . Alcohol use: No  . Drug use: Yes    Types: Oxycodone    Home Medications Prior to Admission medications   Medication Sig Start Date End Date Taking? Authorizing Provider  AMBULATORY NON FORMULARY MEDICATION Medication Name:  Intrathecal pump Fentanyl 1,000.0 mcg/ml Bupivacaine 20.0 mg/ml 40 ml pump Rate 357.2 mcg/day    [provider]  atorvastatin (LIPITOR) 20 MG tablet Take 20 mg by mouth every morning.  04/29/16   [provider]  lisinopril (ZESTRIL) 20 MG tablet Take 2 tablets (40 mg total) by mouth every morning. 06/12/20   Fayrene Helper, PA-C  morphine (MS CONTIN) 30 MG 12 hr tablet Take 1 tablet (30 mg total) by mouth every 12 (twelve) hours. Must last 30 days. Do not break tablet 11/19/20 12/19/20  Delano Metz, MD  morphine (MS CONTIN) 30 MG 12  hr tablet Take 1 tablet (30 mg total) by mouth every 12 (twelve) hours. Must last 30 days. Do not break tablet 12/19/20 01/18/21  Delano Metz, MD  morphine (MS CONTIN) 30 MG 12 hr tablet Take 1 tablet (30 mg total) by mouth every 12 (twelve) hours. Must last 30 days. Do not break tablet 01/18/21 02/17/21  Delano Metz, MD  rOPINIRole (REQUIP) 0.5 MG tablet Take 1 mg by mouth at bedtime. Restless legs 10/22/17   [provider]    Allergies    Benadryl [diphenhydramine hcl] and Bee venom  Review of Systems   Review of Systems  Musculoskeletal: Positive for arthralgias.  All other systems reviewed and are negative.   Physical Exam Updated Vital Signs BP (!) 186/84   Pulse 70   Temp 99 F (37.2 C) (Oral)   Resp 18   SpO2 98%   Physical Exam Vitals and nursing note reviewed.  Constitutional:  Appearance: She is well-developed.  HENT:     Head: Normocephalic and atraumatic.  Eyes:     Conjunctiva/sclera: Conjunctivae normal.     Pupils: Pupils are equal, round, and reactive to light.  Cardiovascular:     Rate and Rhythm: Normal rate and regular rhythm.     Heart sounds: Normal heart sounds.  Pulmonary:     Effort: Pulmonary effort is normal.     Breath sounds: Normal breath sounds.  Abdominal:     General: Bowel sounds are normal.     Palpations: Abdomen is soft.  Musculoskeletal:        General: Normal range of motion.     Cervical back: Normal range of motion.     Comments: Left knee deformity present, held in flexed position, no tenderness along lower leg or into hip, DP pulse intact, foot warm and well perfused, moving toes as normal  Skin:    General: Skin is warm and dry.  Neurological:     Mental Status: She is alert and oriented to person, place, and time.     ED Results / Procedures / Treatments   Labs (all labs ordered are listed, but only abnormal results are displayed) Labs Reviewed  I-STAT CHEM 8, ED - Abnormal; Notable for the  following components:      Result Value   Calcium, Ion 1.10 (*)    All other components within normal limits  RESP PANEL BY RT-PCR (FLU A&B, COVID) ARPGX2    EKG None  Radiology CT ANGIO LOW EXTREM LEFT W &/OR WO CONTRAST  Result Date: 12/19/2020 CLINICAL DATA:  Recent dislocation of the knee prosthesis with pain, initial encounter EXAM: CT ANGIOGRAPHY OF THE LEFT LOWEREXTREMITY TECHNIQUE: Multidetector CT imaging of the left lowerwas performed using the standard protocol during bolus administration of intravenous contrast. Multiplanar CT image reconstructions and MIPs were obtained to evaluate the vascular anatomy. CONTRAST:  OMNIPAQUE IOHEXOL 350 MG/ML SOLN COMPARISON:  Plain film from earlier in the same day. FINDINGS: Vascular: Distal abdominal aorta and iliac vessels are well visualized bilaterally. Right lower extremity: Right lower extremity is included in this imaging due to a wide field of view. The common femoral, superficial femoral and popliteal artery appear within normal limits. The popliteal trifurcation is patent with three-vessel runoff to the level of the right ankle. Left lower extremity: Common femoral and superficial femoral artery are widely patent. Popliteal artery is well visualized within normal popliteal trifurcation. A portion of the mid popliteal artery is not visualized for a few images due to scatter artifact from the knee prosthesis. Despite this the distal aspect of the popliteal artery demonstrates a normal caliber similar to that seen in the proximal popliteal artery. Three-vessel runoff is noted to the level of the left ankle. No focal area of extravasation is noted. Nonvascular: Posterior displacement of the tibial component with respect to the femoral component is noted consistent with that seen on recent plain film examination. No other acute bony abnormality is noted. Postsurgical changes in the lumbar spine are noted. Visualized visceral structures are  unremarkable. Stimulator packs are noted over the anterior lower pelvic wall. Bladder is within normal limits. No muscular abnormality is seen. No focal hematoma is noted. Of the MIP images confirms the above findings. IMPRESSION: Changes consistent with posterior subluxation/dislocation of the tibial component with respect to the femoral component of the knee prosthesis on the left. Three-vessel runoff bilaterally without evidence of definitive arterial injury. No soft tissue hematoma is noted.  Electronically Signed   By: Alcide Clever M.D.   On: 12/19/2020 01:58   DG Knee Complete 4 Views Left  Result Date: 12/18/2020 CLINICAL DATA:  Painful knee with deformity EXAM: LEFT KNEE - COMPLETE 4+ VIEW COMPARISON:  04/12/2010 FINDINGS: Status post left knee replacement. There is posterior dislocation of the tibial component with respect to the femoral component on lateral view. No significant knee effusion. No fracture is seen. IMPRESSION: Status post left knee replacement with posterior dislocation of the tibial component with respect to the femoral component. Electronically Signed   By: Jasmine Pang M.D.   On: 12/18/2020 22:54    Procedures Procedures   Medications Ordered in ED Medications  HYDROmorphone (DILAUDID) injection 1 mg (1 mg Intravenous Given 12/19/20 0554)  HYDROmorphone (DILAUDID) injection 1 mg (1 mg Intravenous Given 12/18/20 2353)  iohexol (OMNIPAQUE) 350 MG/ML injection 100 mL (100 mLs Intravenous Contrast Given 12/19/20 0113)    ED Course  I have reviewed the triage vital signs and the nursing notes.  Pertinent labs & imaging results that were available during my care of the patient were reviewed by me and considered in my medical decision making (see chart for details).    MDM Rules/Calculators/A&P  60 y.o. F here with what appears to be dislocation of left artificial knee.  She was sitting on floor crosslegged and when trying to stand leg would not straighten all the way  causing her to fall.  Knee replacement was done 4 years ago by Dr. Turner Daniels, no issues up until this point.  She does have frank deformity on exam but leg is NVI with good distal pulses, still moving foot/toes normally, warm and well perfused.  X-ray confirms posterior dislocation of tibial component.  Given high risk of vascular injury associated with this, will get CTA of left leg.  Patient given pain control.    CTA without findings of vascular injury.  Discussed with on call orthopedics, Dr. Turner Daniels-- will plan to take to OR this morning.  Keep comfortable until then, continue to check PMS of leg.  Patient and husband updated, they acknowledged understanding and are agreeable with plan.  6:11 AM Patient has remained stable throughout the night.  Pain is controlled.  Leg remains NVI, still wiggling toes.  Foot warm and well perfused.  6:21 AM Dr. Turner Daniels has evaluated patient in ED.  Plan for OR shortly.  Final Clinical Impression(s) / ED Diagnoses Final diagnoses:  Knee dislocation, left, initial encounter    Rx / DC Orders ED Discharge Orders    None       Garlon Hatchet, PA-C 12/19/20 0611    Garlon Hatchet, PA-C 12/19/20 [id number]    Jacalyn Lefevre, MD 12/20/20 [phone]

## 2020-12-19 ENCOUNTER — Encounter (HOSPITAL_COMMUNITY)
Admission: EM | Disposition: A | Discharge status: Home Health | Payer: Self-pay | Source: Home / Self Care | Attending: Orthopedic Surgery

## 2020-12-19 ENCOUNTER — Emergency Department (HOSPITAL_COMMUNITY): Payer: Medicare HMO

## 2020-12-19 ENCOUNTER — Emergency Department (HOSPITAL_COMMUNITY): Payer: Medicare HMO | Admitting: Certified Registered Nurse Anesthetist

## 2020-12-19 ENCOUNTER — Encounter (HOSPITAL_COMMUNITY): Payer: Self-pay

## 2020-12-19 DIAGNOSIS — Z20822 Contact with and (suspected) exposure to covid-19: Secondary | ICD-10-CM | POA: Diagnosis not present

## 2020-12-19 DIAGNOSIS — I4892 Unspecified atrial flutter: Secondary | ICD-10-CM | POA: Diagnosis not present

## 2020-12-19 DIAGNOSIS — E785 Hyperlipidemia, unspecified: Secondary | ICD-10-CM | POA: Diagnosis not present

## 2020-12-19 DIAGNOSIS — G8929 Other chronic pain: Secondary | ICD-10-CM

## 2020-12-19 DIAGNOSIS — M5441 Lumbago with sciatica, right side: Secondary | ICD-10-CM

## 2020-12-19 DIAGNOSIS — Z96652 Presence of left artificial knee joint: Secondary | ICD-10-CM

## 2020-12-19 DIAGNOSIS — M5442 Lumbago with sciatica, left side: Secondary | ICD-10-CM

## 2020-12-19 DIAGNOSIS — I10 Essential (primary) hypertension: Secondary | ICD-10-CM

## 2020-12-19 DIAGNOSIS — R9431 Abnormal electrocardiogram [ECG] [EKG]: Secondary | ICD-10-CM | POA: Diagnosis not present

## 2020-12-19 DIAGNOSIS — M5134 Other intervertebral disc degeneration, thoracic region: Secondary | ICD-10-CM | POA: Diagnosis not present

## 2020-12-19 DIAGNOSIS — I472 Ventricular tachycardia: Secondary | ICD-10-CM | POA: Diagnosis not present

## 2020-12-19 DIAGNOSIS — G894 Chronic pain syndrome: Secondary | ICD-10-CM

## 2020-12-19 DIAGNOSIS — G8918 Other acute postprocedural pain: Secondary | ICD-10-CM | POA: Diagnosis not present

## 2020-12-19 DIAGNOSIS — T84093A Other mechanical complication of internal left knee prosthesis, initial encounter: Secondary | ICD-10-CM | POA: Diagnosis not present

## 2020-12-19 DIAGNOSIS — S83105A Unspecified dislocation of left knee, initial encounter: Secondary | ICD-10-CM | POA: Diagnosis not present

## 2020-12-19 DIAGNOSIS — I493 Ventricular premature depolarization: Secondary | ICD-10-CM | POA: Diagnosis not present

## 2020-12-19 DIAGNOSIS — Z833 Family history of diabetes mellitus: Secondary | ICD-10-CM | POA: Diagnosis not present

## 2020-12-19 DIAGNOSIS — T84028A Dislocation of other internal joint prosthesis, initial encounter: Secondary | ICD-10-CM | POA: Diagnosis not present

## 2020-12-19 DIAGNOSIS — Z87891 Personal history of nicotine dependence: Secondary | ICD-10-CM | POA: Diagnosis not present

## 2020-12-19 DIAGNOSIS — Y793 Surgical instruments, materials and orthopedic devices (including sutures) associated with adverse incidents: Secondary | ICD-10-CM | POA: Diagnosis not present

## 2020-12-19 DIAGNOSIS — M5417 Radiculopathy, lumbosacral region: Secondary | ICD-10-CM | POA: Diagnosis not present

## 2020-12-19 DIAGNOSIS — I480 Paroxysmal atrial fibrillation: Secondary | ICD-10-CM

## 2020-12-19 DIAGNOSIS — E7800 Pure hypercholesterolemia, unspecified: Secondary | ICD-10-CM | POA: Diagnosis not present

## 2020-12-19 DIAGNOSIS — Z91030 Bee allergy status: Secondary | ICD-10-CM | POA: Diagnosis not present

## 2020-12-19 DIAGNOSIS — T84023A Instability of internal left knee prosthesis, initial encounter: Secondary | ICD-10-CM | POA: Diagnosis not present

## 2020-12-19 DIAGNOSIS — Z888 Allergy status to other drugs, medicaments and biological substances status: Secondary | ICD-10-CM | POA: Diagnosis not present

## 2020-12-19 DIAGNOSIS — D62 Acute posthemorrhagic anemia: Secondary | ICD-10-CM | POA: Diagnosis not present

## 2020-12-19 DIAGNOSIS — M5136 Other intervertebral disc degeneration, lumbar region: Secondary | ICD-10-CM | POA: Diagnosis not present

## 2020-12-19 DIAGNOSIS — Z79899 Other long term (current) drug therapy: Secondary | ICD-10-CM | POA: Diagnosis not present

## 2020-12-19 DIAGNOSIS — G2581 Restless legs syndrome: Secondary | ICD-10-CM | POA: Diagnosis not present

## 2020-12-19 HISTORY — PX: TOTAL KNEE REVISION: SHX996

## 2020-12-19 LAB — I-STAT CHEM 8, ED
BUN: 17 mg/dL (ref 6–20)
Calcium, Ion: 1.1 mmol/L — ABNORMAL LOW (ref 1.15–1.40)
Chloride: 107 mmol/L (ref 98–111)
Creatinine, Ser: 0.7 mg/dL (ref 0.44–1.00)
Glucose, Bld: 97 mg/dL (ref 70–99)
HCT: 43 % (ref 36.0–46.0)
Hemoglobin: 14.6 g/dL (ref 12.0–15.0)
Potassium: 4.1 mmol/L (ref 3.5–5.1)
Sodium: 141 mmol/L (ref 135–145)
TCO2: 27 mmol/L (ref 22–32)

## 2020-12-19 LAB — COMPREHENSIVE METABOLIC PANEL
ALT: 26 U/L (ref 0–44)
AST: 27 U/L (ref 15–41)
Albumin: 4.1 g/dL (ref 3.5–5.0)
Alkaline Phosphatase: 108 U/L (ref 38–126)
Anion gap: 10 (ref 5–15)
BUN: 10 mg/dL (ref 6–20)
CO2: 25 mmol/L (ref 22–32)
Calcium: 9.1 mg/dL (ref 8.9–10.3)
Chloride: 106 mmol/L (ref 98–111)
Creatinine, Ser: 0.57 mg/dL (ref 0.44–1.00)
GFR, Estimated: >60 mL/min (ref >60)
Glucose, Bld: 156 mg/dL — ABNORMAL HIGH (ref 70–99)
Potassium: 3.9 mmol/L (ref 3.5–5.1)
Sodium: 141 mmol/L (ref 135–145)
Total Bilirubin: 0.7 mg/dL (ref 0.3–1.2)
Total Protein: 7.7 g/dL (ref 6.5–8.1)

## 2020-12-19 LAB — TYPE AND SCREEN
ABO/RH(D): O POS
Antibody Screen: NEGATIVE

## 2020-12-19 LAB — T4, FREE: Free T4: 1.1 ng/dL (ref 0.61–1.12)

## 2020-12-19 LAB — RESP PANEL BY RT-PCR (FLU A&B, COVID) ARPGX2
Influenza A by PCR: NEGATIVE
Influenza B by PCR: NEGATIVE
SARS Coronavirus 2 by RT PCR: NEGATIVE

## 2020-12-19 LAB — MAGNESIUM: Magnesium: 2 mg/dL (ref 1.7–2.4)

## 2020-12-19 LAB — PROTIME-INR
INR: 1 (ref 0.8–1.2)
Prothrombin Time: 13.1 seconds (ref 11.4–15.2)

## 2020-12-19 LAB — MRSA PCR SCREENING: MRSA by PCR: NEGATIVE

## 2020-12-19 LAB — TSH: TSH: 0.705 u[IU]/mL (ref 0.350–4.500)

## 2020-12-19 SURGERY — TOTAL KNEE REVISION
Anesthesia: General | Site: Knee | Laterality: Left

## 2020-12-19 MED ORDER — MENTHOL 3 MG MT LOZG: 1.0000 | LOZENGE | OROMUCOSAL | Status: DC | PRN

## 2020-12-19 MED ORDER — TIZANIDINE HCL 2 MG PO TABS: 2.0000 mg | ORAL_TABLET | Freq: Four times a day (QID) | ORAL | 0 refills | Status: DC | PRN

## 2020-12-19 MED ORDER — LIDOCAINE 2% (20 MG/ML) 5 ML SYRINGE
INTRAMUSCULAR | Status: DC | PRN
  Administered 2020-12-19: 60 mg via INTRAVENOUS

## 2020-12-19 MED ORDER — ONDANSETRON HCL 4 MG PO TABS: 4.0000 mg | ORAL_TABLET | Freq: Four times a day (QID) | ORAL | Status: DC | PRN

## 2020-12-19 MED ORDER — DEXMEDETOMIDINE (PRECEDEX) IN NS 20 MCG/5ML (4 MCG/ML) IV SYRINGE
PREFILLED_SYRINGE | INTRAVENOUS | Status: AC
  Filled 2020-12-19: qty 10

## 2020-12-19 MED ORDER — TRANEXAMIC ACID 1000 MG/10ML IV SOLN
INTRAVENOUS | Status: DC | PRN
  Administered 2020-12-19: 2000 mg via TOPICAL

## 2020-12-19 MED ORDER — ASPIRIN EC 81 MG PO TBEC: 81.0000 mg | DELAYED_RELEASE_TABLET | Freq: Two times a day (BID) | ORAL | 0 refills | Status: DC

## 2020-12-19 MED ORDER — POVIDONE-IODINE 10 % EX SWAB
2.0000 "application " | Freq: Once | CUTANEOUS | Status: AC
  Administered 2020-12-19: 2 via TOPICAL

## 2020-12-19 MED ORDER — PHENOL 1.4 % MT LIQD: 1.0000 | OROMUCOSAL | Status: DC | PRN

## 2020-12-19 MED ORDER — METOPROLOL TARTRATE 5 MG/5ML IV SOLN
INTRAVENOUS | Status: AC
  Filled 2020-12-19: qty 5

## 2020-12-19 MED ORDER — DILTIAZEM HCL-DEXTROSE 125-5 MG/125ML-% IV SOLN (PREMIX)
5.0000 mg/h | INTRAVENOUS | Status: DC
  Filled 2020-12-19: qty 125

## 2020-12-19 MED ORDER — FENTANYL CITRATE (PF) 100 MCG/2ML IJ SOLN
50.0000 ug | INTRAMUSCULAR | Status: AC
  Administered 2020-12-19: 100 ug via INTRAVENOUS

## 2020-12-19 MED ORDER — ACETAMINOPHEN 500 MG PO TABS
500.0000 mg | ORAL_TABLET | Freq: Four times a day (QID) | ORAL | Status: AC
  Administered 2020-12-19 – 2020-12-20 (×4): 500 mg via ORAL
  Filled 2020-12-19 (×4): qty 1

## 2020-12-19 MED ORDER — ROPINIROLE HCL 1 MG PO TABS
1.0000 mg | ORAL_TABLET | Freq: Every day | ORAL | Status: DC
  Administered 2020-12-19: 1 mg via ORAL
  Filled 2020-12-19 (×2): qty 1

## 2020-12-19 MED ORDER — HYDROMORPHONE HCL 1 MG/ML IJ SOLN: 0.2500 mg | INTRAMUSCULAR | Status: DC | PRN

## 2020-12-19 MED ORDER — MORPHINE SULFATE (PF) 2 MG/ML IV SOLN: 0.5000 mg | INTRAVENOUS | Status: DC | PRN

## 2020-12-19 MED ORDER — AMISULPRIDE (ANTIEMETIC) 5 MG/2ML IV SOLN: 10.0000 mg | Freq: Once | INTRAVENOUS | Status: DC | PRN

## 2020-12-19 MED ORDER — ALUM & MAG HYDROXIDE-SIMETH 200-200-20 MG/5ML PO SUSP: 30.0000 mL | ORAL | Status: DC | PRN

## 2020-12-19 MED ORDER — DILTIAZEM HCL-DEXTROSE 125-5 MG/125ML-% IV SOLN (PREMIX)
5.0000 mg/h | INTRAVENOUS | Status: DC
  Administered 2020-12-19: 5 mg/h via INTRAVENOUS
  Filled 2020-12-19: qty 125

## 2020-12-19 MED ORDER — MIDAZOLAM HCL 2 MG/2ML IJ SOLN
1.0000 mg | INTRAMUSCULAR | Status: AC
  Administered 2020-12-19: 2 mg via INTRAVENOUS

## 2020-12-19 MED ORDER — EPHEDRINE SULFATE-NACL 50-0.9 MG/10ML-% IV SOSY
PREFILLED_SYRINGE | INTRAVENOUS | Status: DC | PRN
  Administered 2020-12-19 (×2): 10 mg via INTRAVENOUS

## 2020-12-19 MED ORDER — BUPIVACAINE LIPOSOME 1.3 % IJ SUSP
INTRAMUSCULAR | Status: DC | PRN
  Administered 2020-12-19: 20 mL

## 2020-12-19 MED ORDER — LACTATED RINGERS IV BOLUS: 250.0000 mL | Freq: Once | INTRAVENOUS | Status: DC

## 2020-12-19 MED ORDER — METHOCARBAMOL 500 MG PO TABS
500.0000 mg | ORAL_TABLET | Freq: Four times a day (QID) | ORAL | Status: DC | PRN
  Administered 2020-12-20: 500 mg via ORAL
  Filled 2020-12-19: qty 1

## 2020-12-19 MED ORDER — CHLORHEXIDINE GLUCONATE CLOTH 2 % EX PADS
6.0000 | MEDICATED_PAD | Freq: Every day | CUTANEOUS | Status: DC
  Administered 2020-12-19: 6 via TOPICAL

## 2020-12-19 MED ORDER — DEXAMETHASONE SODIUM PHOSPHATE 10 MG/ML IJ SOLN
8.0000 mg | Freq: Once | INTRAMUSCULAR | Status: AC
  Administered 2020-12-19: 5 mg via INTRAVENOUS

## 2020-12-19 MED ORDER — LISINOPRIL 10 MG PO TABS
40.0000 mg | ORAL_TABLET | Freq: Every day | ORAL | Status: DC
  Administered 2020-12-20: 40 mg via ORAL
  Filled 2020-12-19: qty 4

## 2020-12-19 MED ORDER — CEFAZOLIN SODIUM-DEXTROSE 2-4 GM/100ML-% IV SOLN
2.0000 g | INTRAVENOUS | Status: AC
  Administered 2020-12-19: 2 g via INTRAVENOUS
  Filled 2020-12-19: qty 100

## 2020-12-19 MED ORDER — BUPIVACAINE LIPOSOME 1.3 % IJ SUSP
20.0000 mL | Freq: Once | INTRAMUSCULAR | Status: DC
  Filled 2020-12-19: qty 20

## 2020-12-19 MED ORDER — LACTATED RINGERS IV SOLN: INTRAVENOUS | Status: DC

## 2020-12-19 MED ORDER — 0.9 % SODIUM CHLORIDE (POUR BTL) OPTIME
TOPICAL | Status: DC | PRN
  Administered 2020-12-19: 400 mL

## 2020-12-19 MED ORDER — ONDANSETRON HCL 4 MG/2ML IJ SOLN: 4.0000 mg | Freq: Four times a day (QID) | INTRAMUSCULAR | Status: DC | PRN

## 2020-12-19 MED ORDER — OXYCODONE HCL 5 MG PO TABS: 5.0000 mg | ORAL_TABLET | Freq: Once | ORAL | Status: DC | PRN

## 2020-12-19 MED ORDER — DEXAMETHASONE SODIUM PHOSPHATE 10 MG/ML IJ SOLN
INTRAMUSCULAR | Status: AC
  Filled 2020-12-19: qty 1

## 2020-12-19 MED ORDER — ADENOSINE 6 MG/2ML IV SOLN
INTRAVENOUS | Status: AC
  Filled 2020-12-19: qty 2

## 2020-12-19 MED ORDER — BISACODYL 5 MG PO TBEC: 5.0000 mg | DELAYED_RELEASE_TABLET | Freq: Every day | ORAL | Status: DC | PRN

## 2020-12-19 MED ORDER — KETAMINE HCL 10 MG/ML IJ SOLN
INTRAMUSCULAR | Status: AC
  Filled 2020-12-19: qty 1

## 2020-12-19 MED ORDER — METOCLOPRAMIDE HCL 5 MG PO TABS: 5.0000 mg | ORAL_TABLET | Freq: Three times a day (TID) | ORAL | Status: DC | PRN

## 2020-12-19 MED ORDER — ONDANSETRON HCL 4 MG/2ML IJ SOLN
INTRAMUSCULAR | Status: DC | PRN
  Administered 2020-12-19: 4 mg via INTRAVENOUS

## 2020-12-19 MED ORDER — SODIUM CHLORIDE (PF) 0.9 % IJ SOLN
INTRAMUSCULAR | Status: AC
  Filled 2020-12-19: qty 70

## 2020-12-19 MED ORDER — CHLORHEXIDINE GLUCONATE 4 % EX LIQD: 60.0000 mL | Freq: Once | CUTANEOUS | Status: DC

## 2020-12-19 MED ORDER — LACTATED RINGERS IV BOLUS: 500.0000 mL | Freq: Once | INTRAVENOUS | Status: DC

## 2020-12-19 MED ORDER — EPHEDRINE 5 MG/ML INJ
INTRAVENOUS | Status: AC
  Filled 2020-12-19: qty 10

## 2020-12-19 MED ORDER — PANTOPRAZOLE SODIUM 40 MG PO TBEC
40.0000 mg | DELAYED_RELEASE_TABLET | Freq: Every day | ORAL | Status: DC
  Administered 2020-12-19 – 2020-12-20 (×2): 40 mg via ORAL
  Filled 2020-12-19 (×2): qty 1

## 2020-12-19 MED ORDER — CELECOXIB 200 MG PO CAPS
200.0000 mg | ORAL_CAPSULE | Freq: Two times a day (BID) | ORAL | Status: DC
  Administered 2020-12-19 – 2020-12-20 (×2): 200 mg via ORAL
  Filled 2020-12-19 (×3): qty 1

## 2020-12-19 MED ORDER — MIDAZOLAM HCL 2 MG/2ML IJ SOLN
INTRAMUSCULAR | Status: AC
  Filled 2020-12-19: qty 2

## 2020-12-19 MED ORDER — FENTANYL CITRATE (PF) 100 MCG/2ML IJ SOLN
INTRAMUSCULAR | Status: AC
  Filled 2020-12-19: qty 2

## 2020-12-19 MED ORDER — POVIDONE-IODINE 10 % EX SWAB: 2.0000 "application " | Freq: Once | CUTANEOUS | Status: DC

## 2020-12-19 MED ORDER — DOCUSATE SODIUM 100 MG PO CAPS
100.0000 mg | ORAL_CAPSULE | Freq: Two times a day (BID) | ORAL | Status: DC
  Administered 2020-12-19 – 2020-12-20 (×2): 100 mg via ORAL
  Filled 2020-12-19 (×2): qty 1

## 2020-12-19 MED ORDER — AMIODARONE LOAD VIA INFUSION
150.0000 mg | Freq: Once | INTRAVENOUS | Status: AC
  Administered 2020-12-19: 150 mg via INTRAVENOUS
  Filled 2020-12-19: qty 83.34

## 2020-12-19 MED ORDER — DEXAMETHASONE SODIUM PHOSPHATE 10 MG/ML IJ SOLN
10.0000 mg | Freq: Once | INTRAMUSCULAR | Status: AC
  Administered 2020-12-20: 10 mg via INTRAVENOUS
  Filled 2020-12-19: qty 1

## 2020-12-19 MED ORDER — HYDROMORPHONE HCL 1 MG/ML IJ SOLN
1.0000 mg | INTRAMUSCULAR | Status: AC | PRN
  Administered 2020-12-19 (×3): 1 mg via INTRAVENOUS
  Filled 2020-12-19 (×3): qty 1

## 2020-12-19 MED ORDER — METOCLOPRAMIDE HCL 5 MG/ML IJ SOLN: 5.0000 mg | Freq: Three times a day (TID) | INTRAMUSCULAR | Status: DC | PRN

## 2020-12-19 MED ORDER — PROPOFOL 10 MG/ML IV BOLUS
INTRAVENOUS | Status: DC | PRN
  Administered 2020-12-19: 200 mg via INTRAVENOUS

## 2020-12-19 MED ORDER — KETAMINE HCL 10 MG/ML IJ SOLN
INTRAMUSCULAR | Status: DC | PRN
  Administered 2020-12-19: 20 mg via INTRAVENOUS
  Administered 2020-12-19: 30 mg via INTRAVENOUS

## 2020-12-19 MED ORDER — SODIUM CHLORIDE (PF) 0.9 % IJ SOLN
INTRAMUSCULAR | Status: DC | PRN
  Administered 2020-12-19: 50 mL

## 2020-12-19 MED ORDER — TRANEXAMIC ACID 1000 MG/10ML IV SOLN
2000.0000 mg | INTRAVENOUS | Status: DC
  Filled 2020-12-19: qty 20

## 2020-12-19 MED ORDER — ACETAMINOPHEN 325 MG PO TABS: 325.0000 mg | ORAL_TABLET | Freq: Four times a day (QID) | ORAL | Status: DC | PRN

## 2020-12-19 MED ORDER — AMIODARONE HCL IN DEXTROSE 360-4.14 MG/200ML-% IV SOLN
30.0000 mg/h | INTRAVENOUS | Status: DC
  Administered 2020-12-20: 30 mg/h via INTRAVENOUS
  Filled 2020-12-19: qty 200

## 2020-12-19 MED ORDER — ONDANSETRON HCL 4 MG/2ML IJ SOLN: 4.0000 mg | Freq: Once | INTRAMUSCULAR | Status: DC | PRN

## 2020-12-19 MED ORDER — POLYETHYLENE GLYCOL 3350 17 G PO PACK: 17.0000 g | PACK | Freq: Every day | ORAL | Status: DC | PRN

## 2020-12-19 MED ORDER — IOHEXOL 350 MG/ML SOLN
100.0000 mL | Freq: Once | INTRAVENOUS | Status: AC | PRN
  Administered 2020-12-19: 100 mL via INTRAVENOUS

## 2020-12-19 MED ORDER — ASPIRIN 81 MG PO CHEW
81.0000 mg | CHEWABLE_TABLET | Freq: Two times a day (BID) | ORAL | Status: DC
  Administered 2020-12-19 – 2020-12-20 (×2): 81 mg via ORAL
  Filled 2020-12-19 (×2): qty 1

## 2020-12-19 MED ORDER — ROPIVACAINE HCL 5 MG/ML IJ SOLN
INTRAMUSCULAR | Status: DC | PRN
  Administered 2020-12-19: 30 mL via PERINEURAL

## 2020-12-19 MED ORDER — TRANEXAMIC ACID-NACL 1000-0.7 MG/100ML-% IV SOLN
1000.0000 mg | INTRAVENOUS | Status: AC
Start: 2020-12-19 — End: 2020-12-19
  Administered 2020-12-19: 1000 mg via INTRAVENOUS
  Filled 2020-12-19: qty 100

## 2020-12-19 MED ORDER — HYDROCODONE-ACETAMINOPHEN 5-325 MG PO TABS: 1.0000 | ORAL_TABLET | ORAL | Status: DC | PRN

## 2020-12-19 MED ORDER — BUPIVACAINE-EPINEPHRINE (PF) 0.25% -1:200000 IJ SOLN
INTRAMUSCULAR | Status: AC
  Filled 2020-12-19: qty 30

## 2020-12-19 MED ORDER — METHOCARBAMOL 1000 MG/10ML IJ SOLN
500.0000 mg | Freq: Four times a day (QID) | INTRAVENOUS | Status: DC | PRN
  Filled 2020-12-19: qty 5

## 2020-12-19 MED ORDER — BUPIVACAINE LIPOSOME 1.3 % IJ SUSP: 20.0000 mL | Freq: Once | INTRAMUSCULAR | Status: DC

## 2020-12-19 MED ORDER — METOPROLOL TARTRATE 5 MG/5ML IV SOLN
INTRAVENOUS | Status: DC | PRN
  Administered 2020-12-19: 5 mg via INTRAVENOUS

## 2020-12-19 MED ORDER — DILTIAZEM LOAD VIA INFUSION
10.0000 mg | Freq: Once | INTRAVENOUS | Status: DC
  Filled 2020-12-19: qty 10

## 2020-12-19 MED ORDER — FENTANYL CITRATE (PF) 250 MCG/5ML IJ SOLN
INTRAMUSCULAR | Status: AC
  Filled 2020-12-19: qty 5

## 2020-12-19 MED ORDER — BUPIVACAINE-EPINEPHRINE 0.25% -1:200000 IJ SOLN
INTRAMUSCULAR | Status: DC | PRN
  Administered 2020-12-19: 30 mL

## 2020-12-19 MED ORDER — TRANEXAMIC ACID-NACL 1000-0.7 MG/100ML-% IV SOLN
1000.0000 mg | Freq: Once | INTRAVENOUS | Status: AC
  Administered 2020-12-19: 1000 mg via INTRAVENOUS
  Filled 2020-12-19 (×2): qty 100

## 2020-12-19 MED ORDER — FLEET ENEMA 7-19 GM/118ML RE ENEM: 1.0000 | ENEMA | Freq: Once | RECTAL | Status: DC | PRN

## 2020-12-19 MED ORDER — TRANEXAMIC ACID 1000 MG/10ML IV SOLN: 2000.0000 mg | Freq: Once | INTRAVENOUS | Status: DC

## 2020-12-19 MED ORDER — HYDROCODONE-ACETAMINOPHEN 7.5-325 MG PO TABS
1.0000 | ORAL_TABLET | ORAL | Status: DC | PRN
  Administered 2020-12-19 – 2020-12-20 (×2): 1 via ORAL
  Filled 2020-12-19 (×2): qty 1

## 2020-12-19 MED ORDER — DEXMEDETOMIDINE (PRECEDEX) IN NS 20 MCG/5ML (4 MCG/ML) IV SYRINGE
PREFILLED_SYRINGE | INTRAVENOUS | Status: DC | PRN
  Administered 2020-12-19 (×3): 4 ug via INTRAVENOUS
  Administered 2020-12-19: 8 ug via INTRAVENOUS

## 2020-12-19 MED ORDER — KCL IN DEXTROSE-NACL 20-5-0.45 MEQ/L-%-% IV SOLN
INTRAVENOUS | Status: DC
  Filled 2020-12-19: qty 1000

## 2020-12-19 MED ORDER — ATORVASTATIN CALCIUM 10 MG PO TABS
20.0000 mg | ORAL_TABLET | Freq: Every morning | ORAL | Status: DC
  Administered 2020-12-20: 20 mg via ORAL
  Filled 2020-12-19: qty 2

## 2020-12-19 MED ORDER — FENTANYL CITRATE (PF) 100 MCG/2ML IJ SOLN
INTRAMUSCULAR | Status: DC | PRN
  Administered 2020-12-19 (×2): 50 ug via INTRAVENOUS
  Administered 2020-12-19: 100 ug via INTRAVENOUS

## 2020-12-19 MED ORDER — MORPHINE SULFATE ER 30 MG PO TBCR
30.0000 mg | EXTENDED_RELEASE_TABLET | Freq: Two times a day (BID) | ORAL | Status: DC
  Administered 2020-12-19 – 2020-12-20 (×2): 30 mg via ORAL
  Filled 2020-12-19 (×2): qty 1

## 2020-12-19 MED ORDER — ESMOLOL HCL 100 MG/10ML IV SOLN
INTRAVENOUS | Status: DC | PRN
  Administered 2020-12-19: 50 mg via INTRAVENOUS

## 2020-12-19 MED ORDER — ORAL CARE MOUTH RINSE
15.0000 mL | Freq: Two times a day (BID) | OROMUCOSAL | Status: DC
  Administered 2020-12-19 – 2020-12-20 (×2): 15 mL via OROMUCOSAL

## 2020-12-19 MED ORDER — DEXTROSE-NACL 5-0.45 % IV SOLN: INTRAVENOUS | Status: DC

## 2020-12-19 MED ORDER — AMIODARONE HCL IN DEXTROSE 360-4.14 MG/200ML-% IV SOLN
60.0000 mg/h | INTRAVENOUS | Status: AC
  Administered 2020-12-19 (×2): 60 mg/h via INTRAVENOUS
  Filled 2020-12-19 (×2): qty 200

## 2020-12-19 MED ORDER — PROPOFOL 10 MG/ML IV BOLUS
INTRAVENOUS | Status: AC
  Filled 2020-12-19: qty 40

## 2020-12-19 MED ORDER — ONDANSETRON HCL 4 MG/2ML IJ SOLN
INTRAMUSCULAR | Status: AC
  Filled 2020-12-19: qty 2

## 2020-12-19 MED ORDER — OXYCODONE HCL 5 MG/5ML PO SOLN: 5.0000 mg | Freq: Once | ORAL | Status: DC | PRN

## 2020-12-19 SURGICAL SUPPLY — 57 items
ATTUNE PSRP INSR SZ 5 10M KNEE (Insert) ×1 IMPLANT
BAG DECANTER FOR FLEXI CONT (MISCELLANEOUS) ×2 IMPLANT
BAG SPEC THK2 15X12 ZIP CLS (MISCELLANEOUS) ×1
BAG ZIPLOCK 12X15 (MISCELLANEOUS) ×2 IMPLANT
BLADE OSCILLATING/SAGITTAL (BLADE)
BLADE SAG 18X100X1.27 (BLADE) ×1 IMPLANT
BLADE SAW SGTL 11.0X1.19X90.0M (BLADE) ×1 IMPLANT
BLADE SAW SGTL 81X20 HD (BLADE) ×1 IMPLANT
BLADE SURG SZ10 CARB STEEL (BLADE) ×3 IMPLANT
BLADE SW THK.38XMED LNG THN (BLADE) ×2 IMPLANT
BNDG CMPR MED 10X6 ELC LF (GAUZE/BANDAGES/DRESSINGS) ×1
BNDG CMPR MED 15X6 ELC VLCR LF (GAUZE/BANDAGES/DRESSINGS) ×1
BNDG ELASTIC 6X10 VLCR STRL LF (GAUZE/BANDAGES/DRESSINGS) ×3 IMPLANT
BNDG ELASTIC 6X15 VLCR STRL LF (GAUZE/BANDAGES/DRESSINGS) ×1 IMPLANT
BOWL SMART MIX CTS (DISPOSABLE) ×1 IMPLANT
BRUSH FEMORAL CANAL (MISCELLANEOUS) ×1 IMPLANT
BUR OVAL CARBIDE 4.0 (BURR) IMPLANT
COVER SURGICAL LIGHT HANDLE (MISCELLANEOUS) ×2 IMPLANT
COVER WAND RF STERILE (DRAPES) IMPLANT
CUFF TOURN SGL QUICK 34 (TOURNIQUET CUFF) ×2
CUFF TRNQT CYL 34X4.125X (TOURNIQUET CUFF) ×1 IMPLANT
DECANTER SPIKE VIAL GLASS SM (MISCELLANEOUS) ×4 IMPLANT
DRAPE ORTHO SPLIT 77X108 STRL (DRAPES) ×4
DRAPE SURG ORHT 6 SPLT 77X108 (DRAPES) ×2 IMPLANT
DRAPE U-SHAPE 47X51 STRL (DRAPES) ×2 IMPLANT
DRESSING AQUACEL AG SP 3.5X10 (GAUZE/BANDAGES/DRESSINGS) IMPLANT
DRSG AQUACEL AG ADV 3.5X14 (GAUZE/BANDAGES/DRESSINGS) IMPLANT
DRSG AQUACEL AG SP 3.5X10 (GAUZE/BANDAGES/DRESSINGS) ×2
DURAPREP 26ML APPLICATOR (WOUND CARE) ×2 IMPLANT
ELECT REM PT RETURN 15FT ADLT (MISCELLANEOUS) ×2 IMPLANT
GLOVE SRG 8 PF TXTR STRL LF DI (GLOVE) ×1 IMPLANT
GLOVE SURG ENC MOIS LTX SZ7.5 (GLOVE) ×2 IMPLANT
GLOVE SURG ENC MOIS LTX SZ8.5 (GLOVE) ×2 IMPLANT
GLOVE SURG UNDER POLY LF SZ8 (GLOVE) ×2
GLOVE SURG UNDER POLY LF SZ9 (GLOVE) ×2 IMPLANT
GOWN STRL REUS W/TWL XL LVL3 (GOWN DISPOSABLE) ×5 IMPLANT
HANDPIECE INTERPULSE COAX TIP (DISPOSABLE) ×2
HOOD PEEL AWAY FLYTE STAYCOOL (MISCELLANEOUS) ×7 IMPLANT
KIT TURNOVER KIT A (KITS) ×2 IMPLANT
NDL HYPO 21X1.5 SAFETY (NEEDLE) ×2 IMPLANT
NEEDLE HYPO 21X1.5 SAFETY (NEEDLE) ×4 IMPLANT
NS IRRIG 1000ML POUR BTL (IV SOLUTION) ×2 IMPLANT
PACK TOTAL KNEE CUSTOM (KITS) ×2 IMPLANT
PENCIL SMOKE EVACUATOR (MISCELLANEOUS) IMPLANT
PROTECTOR NERVE ULNAR (MISCELLANEOUS) ×2 IMPLANT
SET HNDPC FAN SPRY TIP SCT (DISPOSABLE) ×1 IMPLANT
STAPLER VISISTAT 35W (STAPLE) IMPLANT
SUT VIC AB 1 CTX 36 (SUTURE) ×4
SUT VIC AB 1 CTX36XBRD ANBCTR (SUTURE) ×1 IMPLANT
SUT VIC AB 3-0 CT1 27 (SUTURE) ×4
SUT VIC AB 3-0 CT1 TAPERPNT 27 (SUTURE) ×1 IMPLANT
SWAB COLLECTION DEVICE MRSA (MISCELLANEOUS) IMPLANT
SWAB CULTURE ESWAB REG 1ML (MISCELLANEOUS) IMPLANT
SYR CONTROL 10ML LL (SYRINGE) ×4 IMPLANT
TRAY FOLEY MTR SLVR 16FR STAT (SET/KITS/TRAYS/PACK) ×1 IMPLANT
WATER STERILE IRR 1000ML POUR (IV SOLUTION) ×4 IMPLANT
WRAP KNEE MAXI GEL POST OP (GAUZE/BANDAGES/DRESSINGS) ×2 IMPLANT

## 2020-12-19 NOTE — ED Notes (Signed)
Pt repositioned, pillows given for comfort and support. Offered warm blankets. Pt remains NPO.

## 2020-12-19 NOTE — Discharge Instructions (Addendum)
INSTRUCTIONS AFTER JOINT REPLACEMENT   o Remove items at home which could result in a fall. This includes throw rugs or furniture in walking pathways o ICE to the affected joint every three hours while awake for 30 minutes at a time, for at least the first 3-5 days, and then as needed for pain and swelling.  Continue to use ice for pain and swelling. You may notice swelling that will progress down to the foot and ankle.  This is normal after surgery.  Elevate your leg when you are not up walking on it.   o Continue to use the breathing machine you got in the hospital (incentive spirometer) which will help keep your temperature down.  It is common for your temperature to cycle up and down following surgery, especially at night when you are not up moving around and exerting yourself.  The breathing machine keeps your lungs expanded and your temperature down.   DIET:  As you were doing prior to hospitalization, we recommend a well-balanced diet.  DRESSING / WOUND CARE / SHOWERING  Keep the surgical dressing until follow up.  The dressing is water proof, so you can shower without any extra covering.  IF THE DRESSING FALLS OFF or the wound gets wet inside, change the dressing with sterile gauze.  Please use good hand washing techniques before changing the dressing.  Do not use any lotions or creams on the incision until instructed by your surgeon.    ACTIVITY  o Increase activity slowly as tolerated, but follow the weight bearing instructions below.   o No driving for 6 weeks or until further direction given by your physician.  You cannot drive while taking narcotics.  o No lifting or carrying greater than 10 lbs. until further directed by your surgeon. o Avoid periods of inactivity such as sitting longer than an hour when not asleep. This helps prevent blood clots.  o You may return to work once you are authorized by your doctor.     WEIGHT BEARING   Weight bearing as tolerated with assist  device (walker, cane, etc) as directed, use it as long as suggested by your surgeon or therapist, typically at least 4-6 weeks.   EXERCISES  Results after joint replacement surgery are often greatly improved when you follow the exercise, range of motion and muscle strengthening exercises prescribed by your doctor. Safety measures are also important to protect the joint from further injury. Any time any of these exercises cause you to have increased pain or swelling, decrease what you are doing until you are comfortable again and then slowly increase them. If you have problems or questions, call your caregiver or physical therapist for advice.   Rehabilitation is important following a joint replacement. After just a few days of immobilization, the muscles of the leg can become weakened and shrink (atrophy).  These exercises are designed to build up the tone and strength of the thigh and leg muscles and to improve motion. Often times heat used for twenty to thirty minutes before working out will loosen up your tissues and help with improving the range of motion but do not use heat for the first two weeks following surgery (sometimes heat can increase post-operative swelling).   These exercises can be done on a training (exercise) mat, on the floor, on a table or on a bed. Use whatever works the best and is most comfortable for you.    Use music or television while you are exercising so that   the exercises are a pleasant break in your day. This will make your life better with the exercises acting as a break in your routine that you can look forward to.   Perform all exercises about fifteen times, three times per day or as directed.  You should exercise both the operative leg and the other leg as well.  Exercises include:   . Quad Sets - Tighten up the muscle on the front of the thigh (Quad) and hold for 5-10 seconds.   . Straight Leg Raises - With your knee straight (if you were given a brace, keep it on),  lift the leg to 60 degrees, hold for 3 seconds, and slowly lower the leg.  Perform this exercise against resistance later as your leg gets stronger.  . Leg Slides: Lying on your back, slowly slide your foot toward your buttocks, bending your knee up off the floor (only go as far as is comfortable). Then slowly slide your foot back down until your leg is flat on the floor again.  . Angel Wings: Lying on your back spread your legs to the side as far apart as you can without causing discomfort.  . Hamstring Strength:  Lying on your back, push your heel against the floor with your leg straight by tightening up the muscles of your buttocks.  Repeat, but this time bend your knee to a comfortable angle, and push your heel against the floor.  You may put a pillow under the heel to make it more comfortable if necessary.   A rehabilitation program following joint replacement surgery can speed recovery and prevent re-injury in the future due to weakened muscles. Contact your doctor or a physical therapist for more information on knee rehabilitation.    CONSTIPATION  Constipation is defined medically as fewer than three stools per week and severe constipation as less than one stool per week.  Even if you have a regular bowel pattern at home, your normal regimen is likely to be disrupted due to multiple reasons following surgery.  Combination of anesthesia, postoperative narcotics, change in appetite and fluid intake all can affect your bowels.   YOU MUST use at least one of the following options; they are listed in order of increasing strength to get the job done.  They are all available over the counter, and you may need to use some, POSSIBLY even all of these options:    Drink plenty of fluids (prune juice may be helpful) and high fiber foods Colace 100 mg by mouth twice a day  Senokot for constipation as directed and as needed Dulcolax (bisacodyl), take with full glass of water  Miralax (polyethylene glycol)  once or twice a day as needed.  If you have tried all these things and are unable to have a bowel movement in the first 3-4 days after surgery call either your surgeon or your primary doctor.    If you experience loose stools or diarrhea, hold the medications until you stool forms back up.  If your symptoms do not get better within 1 week or if they get worse, check with your doctor.  If you experience "the worst abdominal pain ever" or develop nausea or vomiting, please contact the office immediately for further recommendations for treatment.   ITCHING:  If you experience itching with your medications, try taking only a single pain pill, or even half a pain pill at a time.  You can also use Benadryl over the counter for itching or also to   help with sleep.   TED HOSE STOCKINGS:  Use stockings on both legs until for at least 2 weeks or as directed by physician office. They may be removed at night for sleeping.  MEDICATIONS:  See your medication summary on the "After Visit Summary" that nursing will review with you.  You may have some home medications which will be placed on hold until you complete the course of blood thinner medication.  It is important for you to complete the blood thinner medication as prescribed.  PRECAUTIONS:  If you experience chest pain or shortness of breath - call 911 immediately for transfer to the hospital emergency department.   If you develop a fever greater that 101 F, purulent drainage from wound, increased redness or drainage from wound, foul odor from the wound/dressing, or calf pain - CONTACT YOUR SURGEON.                                                   FOLLOW-UP APPOINTMENTS:  If you do not already have a post-op appointment, please call the office for an appointment to be seen by your surgeon.  Guidelines for how soon to be seen are listed in your "After Visit Summary", but are typically between 1-4 weeks after surgery.  OTHER INSTRUCTIONS:   Knee  Replacement:  Do not place pillow under knee, focus on keeping the knee straight while resting. CPM instructions: 0-90 degrees, 2 hours in the morning, 2 hours in the afternoon, and 2 hours in the evening. Place foam block, curve side up under heel at all times except when in CPM or when walking.  DO NOT modify, tear, cut, or change the foam block in any way.  POST-OPERATIVE OPIOID TAPER INSTRUCTIONS: . It is important to wean off of your opioid medication as soon as possible. If you do not need pain medication after your surgery it is ok to stop day one. . Opioids include: o Codeine, Hydrocodone(Norco, Vicodin), Oxycodone(Percocet, oxycontin) and hydromorphone amongst others.  . Long term and even short term use of opiods can cause: o Increased pain response o Dependence o Constipation o Depression o Respiratory depression o And more.  . Withdrawal symptoms can include o Flu like symptoms o Nausea, vomiting o And more . Techniques to manage these symptoms o Hydrate well o Eat regular healthy meals o Stay active o Use relaxation techniques(deep breathing, meditating, yoga) . Do Not substitute Alcohol to help with tapering . If you have been on opioids for less than two weeks and do not have pain than it is ok to stop all together.  . Plan to wean off of opioids o This plan should start within one week post op of your joint replacement. o Maintain the same interval or time between taking each dose and first decrease the dose.  o Cut the total daily intake of opioids by one tablet each day o Next start to increase the time between doses. o The last dose that should be eliminated is the evening dose.     MAKE SURE YOU:  . Understand these instructions.  . Get help right away if you are not doing well or get worse.    Thank you for letting us be a part of your medical care team.  It is a privilege we respect greatly.  We hope these instructions will   help you stay on track for a fast  and full recovery!    Information on my medicine - ELIQUIS (apixaban)  This medication education was reviewed with me or my healthcare representative as part of my discharge preparation.  The pharmacist that spoke with me during my hospital stay was:  Rollene Fare, [facility]  Why was Eliquis prescribed for you? Eliquis was prescribed for you to reduce the risk of blood clots forming after orthopedic surgery.    What do You need to know about Eliquis? Take your Eliquis TWICE DAILY - one tablet in the morning and one tablet in the evening with or without food.  It would be best to take the dose about the same time each day.  If you have difficulty swallowing the tablet whole please discuss with your pharmacist how to take the medication safely.  Take Eliquis exactly as prescribed by your doctor and DO NOT stop taking Eliquis without talking to the doctor who prescribed the medication.  Stopping without other medication to take the place of Eliquis may increase your risk of developing a clot.  After discharge, you should have regular check-up appointments with your healthcare provider that is prescribing your Eliquis.  What do you do if you miss a dose? If a dose of ELIQUIS is not taken at the scheduled time, take it as soon as possible on the same day and twice-daily administration should be resumed.  The dose should not be doubled to make up for a missed dose.  Do not take more than one tablet of ELIQUIS at the same time.  Important Safety Information A possible side effect of Eliquis is bleeding. You should call your healthcare provider right away if you experience any of the following: ? Bleeding from an injury or your nose that does not stop. ? Unusual colored urine (red or dark brown) or unusual colored stools (red or black). ? Unusual bruising for unknown reasons. ? A serious fall or if you hit your head (even if there is no bleeding).  Some medicines may interact with  Eliquis and might increase your risk of bleeding or clotting while on Eliquis. To help avoid this, consult your healthcare provider or pharmacist prior to using any new prescription or non-prescription medications, including herbals, vitamins, non-steroidal anti-inflammatory drugs (NSAIDs) and supplements.  This website has more information on Eliquis (apixaban): http://www.eliquis.com/eliquis/home

## 2020-12-19 NOTE — Interval H&P Note (Signed)
History and Physical Interval Note:  12/19/2020 12:09 PM  Kendra Nelson  has presented today for surgery, with the diagnosis of Revision Of Left Total Knee, Attune Total Knee.  The various methods of treatment have been discussed with the patient and family. After consideration of risks, benefits and other options for treatment, the patient has consented to  Procedure(s): TOTAL KNEE REVISION/ Total Knee Polynethylene Barring, Attune Total Knee (Left) as a surgical intervention.  The patient's history has been reviewed, patient examined, no change in status, stable for surgery.  I have reviewed the patient's chart and labs.  Questions were answered to the patient's satisfaction.     [name]

## 2020-12-19 NOTE — Consult Note (Addendum)
Cardiology Consultation:   Patient ID: Kendra Nelson MRN: [id number]; DOB: [birth year]  Admit date: 12/18/2020 Date of Consult: 12/19/2020  PCP:  Iona Hansen, NP   [facility]  Cardiologist:  No primary care provider on file. NEW Advanced Practice Provider:  No care team member to display Electrophysiologist:  None        Patient Profile:   Kendra Nelson is a 60 y.o. female with a hx of HTN, HLD, chronic back pain with hx spinal cord stimulator, palpitations, tobacco use who is being seen today for the evaluation of atrial fib and NSVT at the request of Dr. Turner Daniels and Dr. Clemens Catholic.  History of Present Illness:   [name] with hx of HTN, and chronic back pain and now total knee today.  Post op on arrival to PACU pt with narrow complex tachycardia with HR >200 and runs of V tach.  She was asymptomatic and given 50 mg of esmolol and HR to 150s.  Then 5 mg IV metoprolol given with HR to 130 to 140s. At this point a fib with RVR.  EKG was done.  Pt without any complaints..    EKG:  The EKG was personally reviewed and demonstrates:  A fib with RVR at 146  Prior EKG 05/2020 with freq PVCs. Telemetry:  Telemetry was personally reviewed and demonstrates:  SR   On the 21st Na 141, K+ 4.1 Cr 0.70 Hgb 14.6 INR 1.0  COVID neg.   BP 145/72 to 172/96 then 128/117.   Past Medical History:  Diagnosis Date  . Anxiety    off of valium- stress  . Arthritis    left knee- rhumatoid- hasnt seen specialist   . Chronic lower back pain   . Complication of anesthesia    WOKE DURING BACK SURGERY ; urinary retention 12/20/2017  . Hyperlipidemia   . Hypertension   . Migraine    "when seasons change" (12/20/2017)  . Neuromuscular disorder (HCC)   . Restless leg syndrome   . Seasonal allergies   . Skin abnormalities   . Wears glasses     Past Surgical History:  Procedure Laterality Date  . BACK SURGERY     8-9 SURGERIES; lower back  . COLONOSCOPY    .  INTRATHECAL PUMP REVISION N/A 05/09/2019   Procedure: Intrathecal pump change;  Surgeon: Odette Fraction, MD;  Location: [facility] OR;  Service: Neurosurgery;  Laterality: N/A;  Intrathecal pump change  . JOINT REPLACEMENT    . PAIN PUMP IMPLANTATION  05/10/2012   Procedure: PAIN PUMP INSERTION;  Surgeon: Maeola Harman, MD;  Location: [facility];  Service: Neurosurgery;  Laterality: N/A;  Pump replacement  . SPINAL CORD STIMULATOR BATTERY EXCHANGE N/A 06/09/2016   Procedure: IMPLANTABLE PULSE GENERATOR REPLACEMENT WITH RECHARGEABLE BATTERY;  Surgeon: Maeola Harman, MD;  Location: [facility] OR;  Service: Neurosurgery;  Laterality: N/A;  . TOTAL KNEE ARTHROPLASTY Left 12/20/2017  . TOTAL KNEE ARTHROPLASTY Left 12/20/2017   Procedure: TOTAL KNEE ARTHROPLASTY;  Surgeon: Gean Birchwood, MD;  Location: [facility] OR;  Service: Orthopedics;  Laterality: Left;  Marland Kitchen VAGINAL HYSTERECTOMY       Home Medications:  Prior to Admission medications   Medication Sig Start Date End Date Taking? Authorizing Provider  AMBULATORY NON FORMULARY MEDICATION Medication Name:  Intrathecal pump Fentanyl 1,000.0 mcg/ml Bupivacaine 20.0 mg/ml 40 ml pump Rate 357.2 mcg/day   Yes [provider]  aspirin EC 81 MG tablet Take 1 tablet (81 mg total) by mouth 2 (  two) times daily. 12/19/20  Yes Dannielle Burn K, PA-C  atorvastatin (LIPITOR) 20 MG tablet Take 20 mg by mouth every morning.  04/29/16  Yes [provider]  lisinopril (ZESTRIL) 20 MG tablet Take 2 tablets (40 mg total) by mouth every morning. 06/12/20  Yes Fayrene Helper, PA-C  morphine (MS CONTIN) 30 MG 12 hr tablet Take 1 tablet (30 mg total) by mouth every 12 (twelve) hours. Must last 30 days. Do not break tablet 11/19/20 12/19/20 Yes Delano Metz, MD  morphine (MS CONTIN) 30 MG 12 hr tablet Take 1 tablet (30 mg total) by mouth every 12 (twelve) hours. Must last 30 days. Do not break tablet 12/19/20 01/18/21 Yes Delano Metz, MD  morphine (MS CONTIN) 30 MG 12 hr tablet  Take 1 tablet (30 mg total) by mouth every 12 (twelve) hours. Must last 30 days. Do not break tablet 01/18/21 02/17/21 Yes Delano Metz, MD  rOPINIRole (REQUIP) 0.5 MG tablet Take 1 mg by mouth at bedtime. Restless legs 10/22/17  Yes [provider]  tiZANidine (ZANAFLEX) 2 MG tablet Take 1 tablet (2 mg total) by mouth every 6 (six) hours as needed. 12/19/20  Yes Allena Katz, PA-C    Inpatient Medications: Scheduled Meds: . adenosine      . bupivacaine liposome  20 mL Other Once  . chlorhexidine  60 mL Topical Once  . fentaNYL      . metoprolol tartrate      . midazolam      . povidone-iodine  2 application Topical Once  . tranexamic acid (CYKLOKAPRON) topical - INTRAOP  2,000 mg Topical To OR   Continuous Infusions: . dextrose 5 % and 0.45% NaCl    . diltiazem (CARDIZEM) infusion    . lactated ringers Stopped (12/19/20 1342)   PRN Meds: 0.9 % irrigation (POUR BTL), amisulpride, bupivacaine liposome, bupivacaine-EPINEPHrine, HYDROmorphone (DILAUDID) injection, ondansetron (ZOFRAN) IV, oxyCODONE **OR** oxyCODONE, sodium chloride (PF), tranexamic acid (CYKLOKAPRON) topical - INTRAOP  Allergies:    Allergies  Allergen Reactions  . Benadryl [Diphenhydramine Hcl] Hives and Rash  . Bee Venom Other (See Comments)    UNSPECIFIED REACTION     Social History:   Social History   Socioeconomic History  . Marital status: Married    Spouse name: Not on file  . Number of children: Not on file  . Years of education: Not on file  . Highest education level: Not on file  Occupational History  . Not on file  Tobacco Use  . Smoking status: Former Smoker    Packs/day: 1.00    Years: 30.00    Pack years: 30.00    Types: Cigarettes    Quit date: 11/12/2018    Years since quitting: 2.1  . Smokeless tobacco: Never Used  Vaping Use  . Vaping Use: Former  Substance and Sexual Activity  . Alcohol use: No  . Drug use: Yes    Types: Oxycodone  . Sexual activity: Not  Currently  Other Topics Concern  . Not on file  Social History Narrative  . Not on file   Social Determinants of Health   Financial Resource Strain: Not on file  Food Insecurity: Not on file  Transportation Needs: Not on file  Physical Activity: Not on file  Stress: Not on file  Social Connections: Not on file  Intimate Partner Violence: Not on file    Family History:    Family History  Problem Relation Age of Onset  . Aortic aneurysm Mother   .  Diabetes Maternal Grandmother      ROS:  Please see the history of present illness.  General:no colds or fevers, no weight changes Skin:no rashes or ulcers HEENT:no blurred vision, no congestion CV:see HPI PUL:see HPI GI:no diarrhea constipation or melena, no indigestion GU:no hematuria, no dysuria MS:no joint pain, no claudication, chronic back pain, arthritis  Neuro:no syncope, no lightheadedness Endo:no diabetes, no thyroid disease  All other ROS reviewed and negative.     Physical Exam/Data:   Vitals:   12/19/20 1400 12/19/20 1415 12/19/20 1430 12/19/20 1445  BP: (!) 145/72 137/88 (!) 172/96   Pulse: (!) 170 (!) 37 (!) 118 (!) 123  Resp: Temp:      TempSrc:      SpO2: 95% 92% 94% 94%  Weight:      Height:        Intake/Output Summary (Last 24 hours) at 12/19/2020 1518 Last data filed at 12/19/2020 1341 Gross per 24 hour  Intake 1200 ml  Output 100 ml  Net 1100 ml   Last 3 Weights 12/19/2020 11/12/2020 08/15/2020  Weight (lbs) 168 lb 170 lb 170 lb  Weight (kg) 76.204 kg 77.111 kg 77.111 kg     Body mass index is 29.76 kg/m.   EXAM per Dr. Jacques Navy   Relevant CV Studies: None  Echo ordered  Laboratory Data:  High Sensitivity Troponin:  No results for input(s): TROPONINIHS in the last 720 hours.   Chemistry Recent Labs  Lab 12/19/20 0006  NA 141  K 4.1  CL 107  GLUCOSE 97  BUN 17  CREATININE 0.70    No results for input(s): PROT, ALBUMIN, AST, ALT, ALKPHOS, BILITOT in the last 168  hours. Hematology Recent Labs  Lab 12/19/20 0006  HGB 14.6  HCT 43.0   BNPNo results for input(s): BNP, PROBNP in the last 168 hours.  DDimer No results for input(s): DDIMER in the last 168 hours.   Radiology/Studies:  CT ANGIO LOW EXTREM LEFT W &/OR WO CONTRAST  Result Date: 12/19/2020 CLINICAL DATA:  Recent dislocation of the knee prosthesis with pain, initial encounter EXAM: CT ANGIOGRAPHY OF THE LEFT LOWEREXTREMITY TECHNIQUE: Multidetector CT imaging of the left lowerwas performed using the standard protocol during bolus administration of intravenous contrast. Multiplanar CT image reconstructions and MIPs were obtained to evaluate the vascular anatomy. CONTRAST:  OMNIPAQUE IOHEXOL 350 MG/ML SOLN COMPARISON:  Plain film from earlier in the same day. FINDINGS: Vascular: Distal abdominal aorta and iliac vessels are well visualized bilaterally. Right lower extremity: Right lower extremity is included in this imaging due to a wide field of view. The common femoral, superficial femoral and popliteal artery appear within normal limits. The popliteal trifurcation is patent with three-vessel runoff to the level of the right ankle. Left lower extremity: Common femoral and superficial femoral artery are widely patent. Popliteal artery is well visualized within normal popliteal trifurcation. A portion of the mid popliteal artery is not visualized for a few images due to scatter artifact from the knee prosthesis. Despite this the distal aspect of the popliteal artery demonstrates a normal caliber similar to that seen in the proximal popliteal artery. Three-vessel runoff is noted to the level of the left ankle. No focal area of extravasation is noted. Nonvascular: Posterior displacement of the tibial component with respect to the femoral component is noted consistent with that seen on recent plain film examination. No other acute bony abnormality is noted. Postsurgical changes in the lumbar spine are  noted. Visualized visceral structures are unremarkable. Stimulator packs are noted over the anterior lower pelvic wall. Bladder is within normal limits. No muscular abnormality is seen. No focal hematoma is noted. Of the MIP images confirms the above findings. IMPRESSION: Changes consistent with posterior subluxation/dislocation of the tibial component with respect to the femoral component of the knee prosthesis on the left. Three-vessel runoff bilaterally without evidence of definitive arterial injury. No soft tissue hematoma is noted. Electronically Signed   By: Alcide Clever M.D.   On: 12/19/2020 01:58   DG Knee Complete 4 Views Left  Result Date: 12/18/2020 CLINICAL DATA:  Painful knee with deformity EXAM: LEFT KNEE - COMPLETE 4+ VIEW COMPARISON:  04/12/2010 FINDINGS: Status post left knee replacement. There is posterior dislocation of the tibial component with respect to the femoral component on lateral view. No significant knee effusion. No fracture is seen. IMPRESSION: Status post left knee replacement with posterior dislocation of the tibial component with respect to the femoral component. Electronically Signed   By: Jasmine Pang M.D.   On: 12/18/2020 22:54     Assessment and Plan:   1. PAF and NSVT with RVR up to 200 post op.  Hx of palpitations.  Rate slowed after esmolol. She was also given 5 mg IV metoprolol. Now in A fib still with RVR.  Dr. Jacques Navy to review strips. No acute ST changes  Will check Echo and repeat lytes and Mg+ and TSH. No chest pain. No SOB --CHA2DS2VASc is 2, now post op.  Will defer anticoagulation to Dr. Jacques Navy   Risk Assessment/Risk Scores:          CHA2DS2-VASc Score = 2  This indicates a 2.2% annual risk of stroke. The patient's score is based upon: CHF History: No HTN History: Yes Diabetes History: No Stroke History: No Vascular Disease History: No Age Score: 0 Gender Score: 1       For questions or updates, please contact [facility] Please  consult [email or web address] for contact info under    Signed, Nada Boozer, NP  12/19/2020 3:18 PM  ---------------------------------------------------------------------------------------------   History and all data above reviewed.  Patient examined.  I agree with the findings as above.  Kendra Nelson is a 60 year old female seen in the PACU for atrial flutter with rapid ventricular response and runs of nonsustained ventricular tachycardia.  She has no known past cardiovascular history and otherwise history includes hypertension and hyperlipidemia.  She has a spinal cord stimulator and chronic pain.  History of tobacco use.  She has reported palpitations to her PCP in the past.  She has ECGs documenting PVCs and bigeminy.  No known structural heart disease and she tells me she has had an echocardiogram in the past which was normal.  We do not have access to these images or report.  She has no symptoms at baseline and presented for outpatient total knee surgery today.  She was anticipated as same-day discharge, however on arrival to the PACU she had a 30 beat run of nonsustained VT and multiple subsequent runs of a wide-complex tachycardia.  She also has a narrow complex irregular tachycardia which appears to be atrial flutter with rapid ventricular response with variable conduction.  She was given 5 mg of IV metoprolol.  Upon receiving the consultation, we recommended IV diltiazem.  She has no exertional symptoms of chest pain or shortness of breath.  Denies PND orthopnea.  Denies leg swelling at home.  Denies lifetime syncope.  Endorses snoring and has never  been screened for sleep apnea.  Electrolytes within normal limits on presentation.  Constitutional: No acute distress Eyes: pupils equally round and reactive to light, sclera non-icteric, normal conjunctiva and lids ENMT: normal dentition, moist mucous membranes Cardiovascular: Irregular and tachycardic, no murmurs. S1 and S2 normal. Radial  pulses normal bilaterally. No jugular venous distention.  Respiratory: clear to auscultation bilaterally GI : normal bowel sounds, soft and nontender. No distention.   MSK: Left leg is wrapped. NEURO: grossly nonfocal exam, moves all extremities. PSYCH: alert and oriented x 3, normal mood and affect.   All available labs, radiology testing, previous records reviewed. Agree with documented assessment and plan of my colleague as stated above with the following additions or changes:  Principal Problem:   S/P total knee arthroplasty, left Active Problems:   Benign essential hypertension   Pure hypercholesterolemia   Chronic pain syndrome   Chronic low back pain (Primary Area of Pain) (Bilateral) (R>L)    Plan: ECG seems most consistent with atrial flutter primarily with 2-1 conduction but some variability.  Telemetry reviewed documenting atrial flutter with rapid ventricular response and longest run of nonsustained ventricular tachycardia of 30 beats.  She was not hemodynamically unstable at that time.  This may also represent atrial fibrillation with aberrancy, but with a history of PVCs on prior ECGs, would not want to exclude the possibility that this wide-complex tachycardia represents nonsustained VT.  She has been on diltiazem for approximately 2 hours with no significant impact in her rates.  To somewhat suppress the nonsustained VT and addressed the atrial flutter, will start amiodarone with bolus and infusion.  I discussed role of anticoagulation with the surgical team Dr. Turner Daniels and Dannielle Burn, PA.  Anticoagulation would be a risk for bleeding postoperatively if started today.  Her CHA2DS2-VASc score is 2 (1 for hypertension 1 for female).  This is likely short-lived postoperative atrial flutter, however with a CHADS2 vasc of 2, we will consider anticoagulation going forward.  Dr. Turner Daniels suggest that half dose Eliquis could be considered, this would be underdosing for the patient's atrial  fibrillation, therefore may not be sufficient.  We will review telemetry overnight and will discuss recommendations for anticoagulation in the morning.  With report of palpitations at home I cannot be certain she is not having atrial fibrillation in the ambulatory setting as well.  Would consider placing a cardiac monitor prior to hospital discharge or and close outpatient follow-up for burden of atrial fibrillation/atrial flutter.  Would also want to place a cardiac monitor for ventricular arrhythmias and PVCs.  She may need short-term p.o. amiodarone home-going which could be reconsidered at outpatient follow-up for duration.   Length of Stay:  LOS: 0 days   Parke Poisson, MD [facility] 4:49 PM  12/19/2020

## 2020-12-19 NOTE — Transfer of Care (Signed)
Immediate Anesthesia Transfer of Care Note  Patient: Kendra Nelson  Procedure(s) Performed: TOTAL KNEE REVISION/ Total Knee Polynethylene Barring, Attune Total Knee (Left Knee)  Patient Location: PACU  Anesthesia Type:GA combined with regional for post-op pain  Level of Consciousness: awake, alert  and patient cooperative  Airway & Oxygen Therapy: Patient Spontanous Breathing and Patient connected to face mask oxygen  Post-op Assessment: Report given to RN   Post vital signs: Reviewed and Anesthesiologist at bedside d/t Increased HR  Last Vitals:  Vitals Value Taken Time  BP 137/88 12/19/20 1415  Temp    Pulse 105 12/19/20 1418  Resp 15 12/19/20 1418  SpO2 91 % 12/19/20 1418  Vitals shown include unvalidated device data.  Last Pain:  Vitals:   12/19/20 1108  TempSrc:   PainSc: 10-Worst pain ever         Complications: No complications documented.

## 2020-12-19 NOTE — Anesthesia Procedure Notes (Signed)
Procedure Name: LMA Insertion Date/Time: 12/19/2020 12:25 PM Performed by: Wynonia Sours, CRNA Pre-anesthesia Checklist: Patient identified, Emergency Drugs available, Suction available, Patient being monitored and Timeout performed Patient Re-evaluated:Patient Re-evaluated prior to induction Oxygen Delivery Method: Circle system utilized Preoxygenation: Pre-oxygenation with 100% oxygen Induction Type: IV induction LMA: LMA with gastric port inserted LMA Size: 4.0 Number of attempts: 1 Placement Confirmation: positive ETCO2 and breath sounds checked- equal and bilateral Tube secured with: Tape Dental Injury: Teeth and Oropharynx as per pre-operative assessment

## 2020-12-19 NOTE — Op Note (Signed)
PATIENT ID:      Kendra Nelson  MRN:     [id number] DOB/AGE:    01-Aug-1961 / 60 y.o.       OPERATIVE REPORT   DATE OF PROCEDURE:  12/19/2020      PREOPERATIVE DIAGNOSIS:   Posterior dislocation of DePuy attune left total knee arthroplasty index procedure was in April 2019.     Estimated body mass index is 29.76 kg/m as calculated from the following:   Height as of this encounter: 5\' 3"  (1.6 m).   Weight as of this encounter: 76.2 kg.                                                       POSTOPERATIVE DIAGNOSIS:   Same                                                                  PROCEDURE:  Procedure(s): Revision of left total knee arthroplasty with removal of attune RP bearing 5 mm and revision to an attune RP bearing number 510 mm    SURGEON:  ASSISTANT:   [name]. Tomi Likens   (Present and scrubbed throughout the case, critical for assistance with exposure, retraction, instrumentation, and closure.)        ANESTHESIA: General LMA, 20cc Exparel, 50cc 0.25% Marcaine EBL: 300 cc FLUID REPLACEMENT: 1500 cc crystaloid TOURNIQUET: DRAINS: None TRANEXAMIC ACID: 1gm IV, 2gm topical COMPLICATIONS:  None         INDICATIONS FOR PROCEDURE: Patient sustained posterior dislocation of left total knee yesterday sitting on the floor [employer] style playing cards with her children she went to stand could not extend the knee had immediate onset of pain.  Was taken to the emergency room at [facility] radiographs revealed that the post on the tibial bearing had jumped posterior to the cam locking her knee in flexion.  She denies any recent or distant trauma to the left knee 8 months ago she fell and injured the right knee but the left knee was fine.  Of note the patient is in chronic pain management and does take 30 mg of MS Contin twice a day for a number of years.  She denies any fevers or chills.  Plain radiographs show no evidence of loosening of the implants.  She is  taken for closed possible open reduction and revision to a thicker bearing if her ligaments are unstable.  Risks and benefits of surgery have been discussed, questions answered.   DESCRIPTION OF PROCEDURE: The patient identified by armband, received  IV antibiotics, in the holding area at [facility]. Patient taken to the operating room, appropriate anesthetic monitors were attached, and general LMA anesthesia was  induced.  The knee was hyperflexed anterior superior traction applied to the tibia and the knee reduced.  In 45 degrees flexion collateral ligamentous instability was noted.  IV Tranexamic acid was given.Tourniquet applied high to the operative thigh. Lateral post and foot positioner applied to the table, the lower extremity was then prepped and draped in usual sterile fashion from the toes  to the tourniquet. Time-out procedure was performed. Tomi Likens. [facility] PAC, was present and scrubbed throughout the case, critical for assistance with, positioning, exposure, retraction, instrumentation, and closure.The skin and subcutaneous tissue along the incision was injected with 20 cc of a mixture of Exparel and Marcaine solution, using a 20-gauge by 1-1/2 inch needle. We began the operation, with the knee flexed 130 degrees, by making the anterior midline incision starting at handbreadth above the patella going over the patella 1 cm medial to and 4 cm distal to the tibial tubercle. Small bleeders in the skin and the subcutaneous tissue identified and cauterized. Transverse retinaculum was incised and reflected medially and a medial parapatellar arthrotomy was accomplished.  The joint fluid is clear with no sign of infection.  We elevated the medial superficial collateral ligament removed scar tissue from the prepatellar region everted the patella and hyperflexed the knee exposing the femoral and tibial implants which were noted to be well fixed.  There was somewhere along the posterior edges of the 5 mm  bearing and some gapping laterally about 2 to 3 mm in hyperflexion.  Using Hohmann retractors to translate the tibia anteriorly we then extracted the 5 mm bearing and performed trial reductions with an 8 and 10 mm bearing finding the best stability with a 10 mm bearing.  The knee could not be dislocated posteriorly in any position with a 10 mm bearing.  At this point the trial components were removed.  A new number 5 x 10 mm RP bearing was inserted the knee was reduced taken through range of motion and stability was noted to be excellent.  The periarticular tissues were then infiltrated with Exparel mixed with Marcaine solution using 10 cc syringes with 22-gauge needles.The wound was irrigated out with normal saline solution pulse lavage. The rest of the Exparel was injected into the parapatellar arthrotomy, subcutaneous tissues, and periosteal tissues. The parapatellar arthrotomy was closed with running #1 Vicryl suture. The subcutaneous tissue with 3-0 undyed Vicryl suture, and the skin with running 3-0 SQ vicryl. An Aquacil and Ace wrap were applied. The patient was taken to recovery room without difficulty.   [name] 12/19/2020, 1:09 PM

## 2020-12-19 NOTE — H&P (Signed)
TOTAL KNEE ADMISSION H&P  Patient is being admitted for Revision left total knee arthroplasy, spin out of DePuy attune polyethylene bearing index procedure was April 2019  Subjective:  Chief Complaint: Painful left total knee arthroplasty after bearing spin out yesterday evening 12/18/2020.  HPI: Kendra Nelson, 60 y.o. female, I performed primary left total knee arthroplasty using DePuy attune components in April 2019 postoperatively she did well and was last seen 6 months later just prior to the COVID pandemic.  She has not been to our office since then and reports that her total knee has done well up until yesterday evening when she was playing with her children was sitting with her hips externally rotated and knee flexed about 110 degrees "[place] style" for about 30 minutes once to get up and could not extend the knee.  She had the immediate onset of pain and was taken to the [facility] emergency room where plain radiographs were obtained showing she had posteriorly subluxed the tibia under the femur moving the post of the RP implant posterior to the cam.  CT angiogram was accomplished showing that there was no vascular compromise and her toes remained pink and well perfused.  Comorbidities include pain management she is on long-acting morphine 30 mg twice daily for chronic back issues under the care of Dr. Laban Emperor.  She denies any loss of consciousness she denies any recent injury she states that 8 months ago she did fall and bruised her contralateral right knee but the left total knee has had no issues since her surgery.  Prior to seeing the patient I did review her x-rays.  Patient Active Problem List   Diagnosis Date Noted  . Uncomplicated opioid dependence (HCC) 08/15/2020  . Weight gain 03/13/2019  . End of battery life of intrathecal infusion pump 12/13/2018  . Primary osteoarthritis of left knee 12/20/2017  . Degenerative arthritis of left knee 12/16/2017  . DDD (degenerative disc  disease), 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  . Effusion of knee (Left) 09/29/2017  . DDD (degenerative disc disease), lumbar 09/29/2017  . Lumbar intervertebral disc protrusion (L2-3) 09/29/2017  . Restless legs syndrome 07/11/2017  . Lumbar facet syndrome (Bilateral) (R>L) 06/16/2017  . Drug-related hair loss 05/31/2017  . Screening for cervical cancer 05/30/2017  . Neurogenic pain 05/26/2017  . Neuropathic pain 05/26/2017  . Chronic musculoskeletal pain 05/26/2017  . Presence of intrathecal pump 05/25/2017  . Adjustment and management of infusion pump 05/25/2017  . Disorder of skeletal system 05/25/2017  . Chronic pain syndrome 05/25/2017  . Problems influencing health status 05/25/2017  . 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 Chronic low back pain (Primary Area of Pain) (Bilateral) (R>L) 05/25/2017  . Chronic lower extremity pain (Secondary Area of Pain) (Bilateral) (R>L) 05/25/2017  . Long term prescription opiate use 05/25/2017  . Opiate use (93.16 MME/day) 05/25/2017  . Cigarette nicotine dependence without complication 11/24/2016  . Psoriasis 11/24/2016  . Need for influenza vaccination 08/26/2016  . 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  . Battery end of life of spinal cord stimulator 06/04/2016  . Pure hypercholesterolemia 02/06/2016  . Need for hepatitis C screening test 02/03/2016  . Palpitations 02/03/2016  . Pharmacologic therapy 02/03/2016  . Chronic knee pain (Left) 01/01/2016  . Adjustment disorder with anxiety 04/02/2015  . Chronic lumbosacral radiculopathy (L5) (Left) 04/02/2015  . Primary insomnia 02/18/2015  . Chronic back pain greater than 3 months duration 11/12/2014  . Agoraphobia with panic disorder 05/23/2014  . Muscle spasm  of back 09/11/2013  . Benign essential hypertension 11/06/2011  . Unspecified constipation 10/05/2007  . Abnormal weight gain 10/05/2007  . Opioid-induced constipation (OIC) 02/10/2007   Past Medical History:   Diagnosis Date  . Anxiety    off of valium- stress  . Arthritis    left knee- rhumatoid- hasnt seen specialist   . Chronic lower back pain   . Complication of anesthesia    WOKE DURING BACK SURGERY ; urinary retention 12/20/2017  . Hyperlipidemia   . Hypertension   . Migraine    "when seasons change" (12/20/2017)  . Neuromuscular disorder (HCC)   . Restless leg syndrome   . Seasonal allergies   . Skin abnormalities   . Wears glasses     Past Surgical History:  Procedure Laterality Date  . BACK SURGERY     8-9 SURGERIES; lower back  . COLONOSCOPY    . INTRATHECAL PUMP REVISION N/A 05/09/2019   Procedure: Intrathecal pump change;  Surgeon: Odette Fraction, MD;  Location: [facility] OR;  Service: Neurosurgery;  Laterality: N/A;  Intrathecal pump change  . JOINT REPLACEMENT    . PAIN PUMP IMPLANTATION  05/10/2012   Procedure: PAIN PUMP INSERTION;  Surgeon: Maeola Harman, MD;  Location: [facility];  Service: Neurosurgery;  Laterality: N/A;  Pump replacement  . SPINAL CORD STIMULATOR BATTERY EXCHANGE N/A 06/09/2016   Procedure: IMPLANTABLE PULSE GENERATOR REPLACEMENT WITH RECHARGEABLE BATTERY;  Surgeon: Maeola Harman, MD;  Location: [facility] OR;  Service: Neurosurgery;  Laterality: N/A;  . TOTAL KNEE ARTHROPLASTY Left 12/20/2017  . TOTAL KNEE ARTHROPLASTY Left 12/20/2017   Procedure: TOTAL KNEE ARTHROPLASTY;  Surgeon: Gean Birchwood, MD;  Location: [facility] OR;  Service: Orthopedics;  Laterality: Left;  Marland Kitchen VAGINAL HYSTERECTOMY      Current Facility-Administered Medications  Medication Dose Route Frequency Provider Last Rate Last Admin  . HYDROmorphone (DILAUDID) injection 1 mg  1 mg Intravenous Q2H PRN Garlon Hatchet, PA-C   1 mg at 12/19/20 6283   Current Outpatient Medications  Medication Sig Dispense Refill Last Dose  . AMBULATORY NON FORMULARY MEDICATION Medication Name:  Intrathecal pump Fentanyl 1,000.0 mcg/ml Bupivacaine 20.0 mg/ml 40 ml pump Rate 357.2 mcg/day   12/18/2020 at Unknown time  .  atorvastatin (LIPITOR) 20 MG tablet Take 20 mg by mouth every morning.    12/18/2020 at am  . lisinopril (ZESTRIL) 20 MG tablet Take 2 tablets (40 mg total) by mouth every morning. 30 tablet 0 12/18/2020 at am  . morphine (MS CONTIN) 30 MG 12 hr tablet Take 1 tablet (30 mg total) by mouth every 12 (twelve) hours. Must last 30 days. Do not break tablet 60 tablet 0 12/17/2020 at 10pm  . morphine (MS CONTIN) 30 MG 12 hr tablet Take 1 tablet (30 mg total) by mouth every 12 (twelve) hours. Must last 30 days. Do not break tablet 60 tablet 0 12/17/2020 at 10pm  . [START ON 01/18/2021] morphine (MS CONTIN) 30 MG 12 hr tablet Take 1 tablet (30 mg total) by mouth every 12 (twelve) hours. Must last 30 days. Do not break tablet 60 tablet 0 12/17/2020 at 10pm  . rOPINIRole (REQUIP) 0.5 MG tablet Take 1 mg by mouth at bedtime. Restless legs   12/17/2020 at Unknown time   Allergies  Allergen Reactions  . Benadryl [Diphenhydramine Hcl] Hives and Rash  . Bee Venom Other (See Comments)    UNSPECIFIED REACTION     Social History   Tobacco Use  . Smoking status: Former Smoker  Packs/day: 1.00    Years: 30.00    Pack years: 30.00    Types: Cigarettes    Quit date: 11/12/2018    Years since quitting: 2.1  . Smokeless tobacco: Never Used  Substance Use Topics  . Alcohol use: No    Family History  Problem Relation Age of Onset  . Aortic aneurysm Mother   . Diabetes Maternal Grandmother      Review of Systems : Patient denies any chest pain shortness of breath numbness or tingling in her feet.  She denies any history of cardiovascular event, stroke, dizziness or syncope. Objective:  Physical Exam: Well-nourished well-developed 60 year old female examined in hospital bed she does not appear to be in any distress.  Her left knee is held flexed 80 degrees obvious posterior deformity of the proximal tibia under the femur.  1+ tender to palpation.  Has maybe 10 degrees motion on either side of where she is holding  her knee at this time.  Her toes are pink and well-perfused.  Skin is intact there is no blanching the wound is well-healed she has a 1+ effusion to palpation.  Vital signs in last 24 hours: Temp:  [99 F (37.2 C)] 99 F (37.2 C) (04/20 2221) Pulse Rate:  [70-76] 73 (04/21 0550) Resp:  [13-21] 16 (04/21 0550) BP: (165-203)/(81-95) 179/95 (04/21 0550) SpO2:  [96 %-100 %] 96 % (04/21 0550)  Labs: Hemoglobin 14.6, hematocrit 43, creatinine 0.7, sodium 141 potassium 4.1   Estimated body mass index is 30.11 kg/m as calculated from the following:   Height as of 11/12/20: 5\' 3"  (1.6 m).   Weight as of 11/12/20: 77.1 kg.    Assessment/Plan:  60 year old female with posterior subluxation of left total knee, spin out of the RP bearing that was placed 3 years ago.  I am not exactly sure how this happened based on her history as she states she never flex it past 110 degrees and I have never seen this happen with a DePuy attune RP knee.  1 possibility would be that the post on the bearing is broken which is unlikely since the knee is locked posterior to the cam.  The other possibility is that she did in fact flex to 150 degrees, the patient is on high doses of morphine and may not be aware of what she was doing when the post dislocated posteriorly.  In any event, the plan is the same.  She needs to be taken to the operating room placed under general anesthesia and a close reduction attempted.  If her collateral ligaments are stable after the closed reduction in the knee cannot be redislocated we would stop there.  Assuming there has been some damage of the collateral ligaments in the knee is loose we would then proceed with revision of the polyethylene bearing which is a 5 mm bearing and upsizing it probably to an 8 to 10 mm bearing.  The risk and benefits were discussed at length with the patient.  There is really no other alternative form of treatment that is reasonable as the knee is locked in 80 degrees  of flexion and is unusable.  Plan is to do the surgery as soon as a room is available today she will probably able to be discharged later today or tomorrow depending on how her surgery goes.

## 2020-12-19 NOTE — ED Notes (Signed)
Blood consent signed

## 2020-12-19 NOTE — Progress Notes (Signed)
Assisted  Dr. Jaynie Collins with Left Adductor Canal block. Side rails up, monitors on throughout procedure. See vital signs in flow sheet. Tolerated Procedure well.

## 2020-12-19 NOTE — Consult Note (Signed)
[facility] Medical Consultation  Kendra Nelson NKN:[id number] DOB: 11-Aug-1961 DOA: 12/18/2020 PCP: Iona Hansen, NP   Requesting physician: Dr Homero Fellers  Date of consultation: 12/19/2020   Reason for consultation: medical management of pain, hypertension and cardiac arrythmia  Impression/Recommendations Principal Problem:   S/P total knee arthroplasty, left Active Problems:   Benign essential hypertension   Pure hypercholesterolemia   Chronic pain syndrome   Chronic low back pain (Primary Area of Pain) (Bilateral) (R>L) Non sustained Ventricular tachycardia Paroxysmal atrial fibrillation/flutter   Status post revision of left total knee arthroplasty.  Management as per primary team.  Patient with a history of left total knee arthroplasty in April 2019.  Narrow complex tachycardia-A.  Flutter with 2:1 block/fibrillation with concerns for nonsustained ventricular tachycardia.  Cardiology has been consulted.  Plan is to continue amiodarone drip and Cardizem drip. Follow cardiology recommendations.  Continue to monitor in telemetry unit.  Continue gentle fluid hydration. Monitor electrolytes.  Replace as necessary.  History of essential hypertension.  We will closely monitor.  Patient takes lisinopril at home.  Will resume.  History of hyperlipidemia Patient takes Lipitor at home.  Will resume.  History of chronic back pain chronic pain syndrome. Multiple back surgeries and spinal cord stimulator.  Follows up with pain clinic.  On morphine as outpatient.  Will resume while in the hospital  [facility] team will followup again tomorrow. Please contact me if I can be of assistance in the meanwhile. Thank you for this consultation.  Chief Complaint: Left knee pain  HPI:  Patient is a 60 years old female with history of hypertension, hyperlipidemia, chronic back pain with history of a spinal cord stimulator and tobacco use disorder presented to the hospital for total left knee  replacement.  Patient does have history of chronic back pain and is on oral morphine and has a history of spinal cord stimulator.  At the PACU patient was noted to have narrow complex tachycardia with heart rate more than 200 and runs of V. tach.  Patient was asymptomatic at that time and received 50 mg IV was followed in the heart rate did slightly improve.  Patient also received metoprolol and subsequently rhythm was atrial fibrillation with RVR.  Cardiology was consulted for this rhythm.  Review of Systems:  All systems were reviewed and were negative except as in the history of presenting illness  Past Medical History:  Diagnosis Date  . Anxiety    off of valium- stress  . Arthritis    left knee- rhumatoid- hasnt seen specialist   . Chronic lower back pain   . Complication of anesthesia    WOKE DURING BACK SURGERY ; urinary retention 12/20/2017  . Hyperlipidemia   . Hypertension   . Migraine    "when seasons change" (12/20/2017)  . Neuromuscular disorder (HCC)   . Restless leg syndrome   . Seasonal allergies   . Skin abnormalities   . Wears glasses    Past Surgical History:  Procedure Laterality Date  . BACK SURGERY     8-9 SURGERIES; lower back  . COLONOSCOPY    . INTRATHECAL PUMP REVISION N/A 05/09/2019   Procedure: Intrathecal pump change;  Surgeon: Odette Fraction, MD;  Location: [facility] OR;  Service: Neurosurgery;  Laterality: N/A;  Intrathecal pump change  . JOINT REPLACEMENT    . PAIN PUMP IMPLANTATION  05/10/2012   Procedure: PAIN PUMP INSERTION;  Surgeon: Maeola Harman, MD;  Location: [facility];  Service: Neurosurgery;  Laterality: N/A;  Pump replacement  . SPINAL CORD STIMULATOR BATTERY EXCHANGE N/A 06/09/2016   Procedure: IMPLANTABLE PULSE GENERATOR REPLACEMENT WITH RECHARGEABLE BATTERY;  Surgeon: Maeola Harman, MD;  Location: [facility] OR;  Service: Neurosurgery;  Laterality: N/A;  . TOTAL KNEE ARTHROPLASTY Left 12/20/2017  . TOTAL KNEE ARTHROPLASTY Left 12/20/2017   Procedure:  TOTAL KNEE ARTHROPLASTY;  Surgeon: Gean Birchwood, MD;  Location: [facility] OR;  Service: Orthopedics;  Laterality: Left;  Marland Kitchen VAGINAL HYSTERECTOMY     Social History:  reports that she quit smoking about 2 years ago. Her smoking use included cigarettes. She has a 30.00 pack-year smoking history. She has never used smokeless tobacco. She reports current drug use. Drug: Oxycodone. She reports that she does not drink alcohol.  Allergies  Allergen Reactions  . Benadryl [Diphenhydramine Hcl] Hives and Rash  . Bee Venom Other (See Comments)    UNSPECIFIED REACTION    Family History  Problem Relation Age of Onset  . Aortic aneurysm Mother   . Diabetes Maternal Grandmother     Prior to Admission medications   Medication Sig Start Date End Date Taking? Authorizing Provider  AMBULATORY NON FORMULARY MEDICATION Medication Name:  Intrathecal pump Fentanyl 1,000.0 mcg/ml Bupivacaine 20.0 mg/ml 40 ml pump Rate 357.2 mcg/day   Yes [provider]  aspirin EC 81 MG tablet Take 1 tablet (81 mg total) by mouth 2 (two) times daily. 12/19/20  Yes Dannielle Burn K, PA-C  atorvastatin (LIPITOR) 20 MG tablet Take 20 mg by mouth every morning.  04/29/16  Yes [provider]  lisinopril (ZESTRIL) 20 MG tablet Take 2 tablets (40 mg total) by mouth every morning. 06/12/20  Yes Fayrene Helper, PA-C  morphine (MS CONTIN) 30 MG 12 hr tablet Take 1 tablet (30 mg total) by mouth every 12 (twelve) hours. Must last 30 days. Do not break tablet 11/19/20 12/19/20 Yes Delano Metz, MD  morphine (MS CONTIN) 30 MG 12 hr tablet Take 1 tablet (30 mg total) by mouth every 12 (twelve) hours. Must last 30 days. Do not break tablet 12/19/20 01/18/21 Yes Delano Metz, MD  morphine (MS CONTIN) 30 MG 12 hr tablet Take 1 tablet (30 mg total) by mouth every 12 (twelve) hours. Must last 30 days. Do not break tablet 01/18/21 02/17/21 Yes Delano Metz, MD  rOPINIRole (REQUIP) 0.5 MG tablet Take 1 mg by mouth at bedtime.  Restless legs 10/22/17  Yes [provider]  tiZANidine (ZANAFLEX) 2 MG tablet Take 1 tablet (2 mg total) by mouth every 6 (six) hours as needed. 12/19/20  Yes Allena Katz, PA-C    Physical Exam: Blood pressure (!) 148/96, pulse 86, temperature 98.6 F (37 C), resp. rate 13, height 5\' 3"  (1.6 m), weight 76.2 kg, SpO2 97 %. Vitals:   12/19/20 1615 12/19/20 1630  BP: (!) 144/93 (!) 148/96  Pulse: (!) 107 86  Resp: 14 13  Temp:  98.6 F (37 C)  SpO2: 94% 97%   Body mass index is 29.76 kg/m.   Intake/Output Summary (Last 24 hours) at 12/19/2020 1651 Last data filed at 12/19/2020 1600 Gross per 24 hour  Intake 1200 ml  Output 1100 ml  Net 100 ml     General:  Average built, not in obvious distress, obese on nasal cannula oxygen HENT: Normocephalic, pupils equally reacting to light and accommodation.  No scleral pallor or icterus noted. Oral mucosa is moist.  Chest:  Clear breath sounds.  Diminished breath sounds bilaterally. No crackles or wheezes.  CVS: S1 &S2 heard.  No murmur.  Irregular rhythm Abdomen: Soft, nontender, nondistended.  Bowel sounds are heard.  Liver is not palpable, no abdominal mass palpated.  Abdominal scar noted Extremities: No cyanosis, clubbing or edema.  Peripheral pulses are palpable.  Status post left knee surgery. Psych: Alert, awake and oriented, normal mood CNS:  No cranial nerve deficits. .  Moves extremities.  No sensory deficits noted.  Skin: Warm and dry.  No rashes noted.  Laboratory Data:  CBC: Recent Labs  Lab 12/19/20 0006  HGB 14.6  HCT 43.0    Basic Metabolic Panel: Recent Labs  Lab 12/19/20 0006 12/19/20 1548  NA 141 141  K 4.1 3.9  CL 107 106  CO2  --  25  GLUCOSE 97 156*  BUN 17 10  CREATININE 0.70 0.57  CALCIUM  --  9.1  MG  --  2.0    Liver Function Tests: Recent Labs  Lab 12/19/20 1548  AST 27  ALT 26  ALKPHOS 108  BILITOT 0.7  PROT 7.7  ALBUMIN 4.1   No results for input(s): LIPASE, AMYLASE in  the last 168 hours. No results for input(s): AMMONIA in the last 168 hours.  Cardiac Enzymes: No results for input(s): CKTOTAL, CKMB, CKMBINDEX, TROPONINI in the last 168 hours.  BNP: Invalid input(s): POCBNP  CBG: No results for input(s): GLUCAP in the last 168 hours.  Radiological Exams on Admission: CT ANGIO LOW EXTREM LEFT W &/OR WO CONTRAST  Result Date: 12/19/2020 CLINICAL DATA:  Recent dislocation of the knee prosthesis with pain, initial encounter EXAM: CT ANGIOGRAPHY OF THE LEFT LOWEREXTREMITY TECHNIQUE: Multidetector CT imaging of the left lowerwas performed using the standard protocol during bolus administration of intravenous contrast. Multiplanar CT image reconstructions and MIPs were obtained to evaluate the vascular anatomy. CONTRAST:  OMNIPAQUE IOHEXOL 350 MG/ML SOLN COMPARISON:  Plain film from earlier in the same day. FINDINGS: Vascular: Distal abdominal aorta and iliac vessels are well visualized bilaterally. Right lower extremity: Right lower extremity is included in this imaging due to a wide field of view. The common femoral, superficial femoral and popliteal artery appear within normal limits. The popliteal trifurcation is patent with three-vessel runoff to the level of the right ankle. Left lower extremity: Common femoral and superficial femoral artery are widely patent. Popliteal artery is well visualized within normal popliteal trifurcation. A portion of the mid popliteal artery is not visualized for a few images due to scatter artifact from the knee prosthesis. Despite this the distal aspect of the popliteal artery demonstrates a normal caliber similar to that seen in the proximal popliteal artery. Three-vessel runoff is noted to the level of the left ankle. No focal area of extravasation is noted. Nonvascular: Posterior displacement of the tibial component with respect to the femoral component is noted consistent with that seen on recent plain film examination. No  other acute bony abnormality is noted. Postsurgical changes in the lumbar spine are noted. Visualized visceral structures are unremarkable. Stimulator packs are noted over the anterior lower pelvic wall. Bladder is within normal limits. No muscular abnormality is seen. No focal hematoma is noted. Of the MIP images confirms the above findings. IMPRESSION: Changes consistent with posterior subluxation/dislocation of the tibial component with respect to the femoral component of the knee prosthesis on the left. Three-vessel runoff bilaterally without evidence of definitive arterial injury. No soft tissue hematoma is noted. Electronically Signed   By: Alcide Clever M.D.   On: 12/19/2020 01:58   DG Knee Complete 4 Views  Left  Result Date: 12/18/2020 CLINICAL DATA:  Painful knee with deformity EXAM: LEFT KNEE - COMPLETE 4+ VIEW COMPARISON:  04/12/2010 FINDINGS: Status post left knee replacement. There is posterior dislocation of the tibial component with respect to the femoral component on lateral view. No significant knee effusion. No fracture is seen. IMPRESSION: Status post left knee replacement with posterior dislocation of the tibial component with respect to the femoral component. Electronically Signed   By: Jasmine Pang M.D.   On: 12/18/2020 22:54    EKG: Independently reviewed.  Notable for atrial fibrillation  Time spent: 65 mins  Shevy Yaney [facility] 12/19/2020

## 2020-12-19 NOTE — Anesthesia Preprocedure Evaluation (Addendum)
Anesthesia Evaluation  Patient identified by MRN, date of birth, ID band Patient awake    Reviewed: Allergy & Precautions, NPO status , Patient's Chart, lab work & pertinent test results  History of Anesthesia Complications Negative for: history of anesthetic complications  Airway Mallampati: II  TM Distance: >3 FB Neck ROM: Full    Dental  (+) Teeth Intact, Dental Advisory Given   Pulmonary neg pulmonary ROS, Patient abstained from smoking., former smoker,    Pulmonary exam normal        Cardiovascular hypertension, Pt. on medications Normal cardiovascular exam  HLD   Neuro/Psych Anxiety Chronic back pain/FBSS with intrathecal opioid pump and chronic MS contin use    GI/Hepatic negative GI ROS,   Endo/Other  negative endocrine ROS  Renal/GU negative Renal ROS  negative genitourinary   Musculoskeletal  (+) Arthritis , narcotic dependent  Abdominal   Peds  Hematology negative hematology ROS (+)   Anesthesia Other Findings   Reproductive/Obstetrics                          Anesthesia Physical Anesthesia Plan  ASA: II  Anesthesia Plan: General   Post-op Pain Management: GA combined w/ Regional for post-op pain   Induction: Intravenous  PONV Risk Score and Plan: 3 and Ondansetron, Dexamethasone, Midazolam and Treatment may vary due to age or medical condition  Airway Management Planned: LMA  Additional Equipment: None  Intra-op Plan:   Post-operative Plan: Extubation in OR  Informed Consent: I have reviewed the patients History and Physical, chart, labs and discussed the procedure including the risks, benefits and alternatives for the proposed anesthesia with the patient or authorized representative who has indicated his/her understanding and acceptance.     Dental advisory given  Plan Discussed with:   Anesthesia Plan Comments: (Plan for GA as patient has intrathecal pump.  Block + ketamine for postop pain control. )      Anesthesia Quick Evaluation

## 2020-12-19 NOTE — Anesthesia Procedure Notes (Signed)
Anesthesia Regional Block: Adductor canal block   Pre-Anesthetic Checklist: ,, timeout performed, Correct Patient, Correct Site, Correct Laterality, Correct Procedure, Correct Position, site marked, Risks and benefits discussed,  Surgical consent,  Pre-op evaluation,  At surgeon's request and post-op pain management  Laterality: Left  Prep: chloraprep       Needles:  Injection technique: Single-shot  Needle Type: Echogenic Stimulator Needle     Needle Length: 10cm  Needle Gauge: 20     Additional Needles:   Procedures:,,,, ultrasound used (permanent image in chart),,,,  Narrative:  Start time: 12/19/2020 11:32 AM End time: 12/19/2020 11:37 AM Injection made incrementally with aspirations every 5 mL.  Performed by: Personally  Anesthesiologist: [name], MD  Additional Notes: Standard monitors applied. Skin prepped. Good needle visualization with ultrasound. Injection made in 5cc increments with no resistance to injection. Patient tolerated the procedure well.

## 2020-12-19 NOTE — ED Notes (Signed)
Report given to short stay. Will pick patient up between 1030-1100

## 2020-12-19 NOTE — Progress Notes (Signed)
Orthopedic Tech Progress Note Patient Details:  Kendra Nelson 1961-07-30 [id number]  Ortho Devices Type of Ortho Device: Bone foam zero knee Ortho Device/Splint Location: Left Foot Ortho Device/Splint Interventions: Application   Post Interventions Patient Tolerated: Well   Kendra Nelson 12/19/2020, 8:28 PM

## 2020-12-19 NOTE — ED Notes (Signed)
Consent signed.

## 2020-12-19 NOTE — Anesthesia Postprocedure Evaluation (Signed)
Anesthesia Post Note  Patient: Kendra Nelson  Procedure(s) Performed: TOTAL KNEE REVISION/ Total Knee Polynethylene Barring, Attune Total Knee (Left Knee)     Patient location during evaluation: PACU Anesthesia Type: General Level of consciousness: awake and alert Pain management: pain level controlled Vital Signs Assessment: post-procedure vital signs reviewed and stable Respiratory status: spontaneous breathing, nonlabored ventilation and respiratory function stable Cardiovascular status: blood pressure returned to baseline, tachycardic and unstable Postop Assessment: no apparent nausea or vomiting Anesthetic complications: yes Comments: On arrival to PACU patient developed extreme narrow complex tachycardia >200 bpm, with runs of V tach. Patient remained awake and alert with no symptoms or complaints, and BP normal to high. 50 mg esmolol given and brought HR to 150s. 5 mg IV metoprolol then given which brought HR to 130s-140s, at which time rhythm appeared to be A fib w/ RVR which was confirmed by 12-lead EKG. Surgeon was informed and advised to admit patient to telemetry unit and cardiology was consulted. Pt experienced no symptoms or complaints and was without nausea or pain.   Encounter Complications  Complication Outcome Phase Comment  Dysrhythmia  In Recovery Extreme tachycardia >200 bpm with episodes of V tach, and later A fib w/ RVR    Last Vitals:  Vitals:   12/19/20 1400 12/19/20 1415  BP: (!) 145/72 137/88  Pulse: (!) 170 (!) 37  Resp: 19 18  Temp:    SpO2: 95% 92%    Last Pain:  Vitals:   12/19/20 1108  TempSrc:   PainSc: 10-Worst pain ever                 [name]

## 2020-12-20 ENCOUNTER — Encounter (HOSPITAL_COMMUNITY): Payer: Self-pay | Admitting: Orthopedic Surgery

## 2020-12-20 ENCOUNTER — Inpatient Hospital Stay (HOSPITAL_COMMUNITY): Payer: Medicare HMO

## 2020-12-20 ENCOUNTER — Other Ambulatory Visit: Payer: Self-pay | Admitting: Physician Assistant

## 2020-12-20 DIAGNOSIS — I4729 Other ventricular tachycardia: Secondary | ICD-10-CM

## 2020-12-20 DIAGNOSIS — Z96652 Presence of left artificial knee joint: Secondary | ICD-10-CM | POA: Diagnosis not present

## 2020-12-20 DIAGNOSIS — G894 Chronic pain syndrome: Secondary | ICD-10-CM | POA: Diagnosis not present

## 2020-12-20 DIAGNOSIS — I484 Atypical atrial flutter: Secondary | ICD-10-CM

## 2020-12-20 DIAGNOSIS — R9431 Abnormal electrocardiogram [ECG] [EKG]: Secondary | ICD-10-CM | POA: Diagnosis not present

## 2020-12-20 DIAGNOSIS — I10 Essential (primary) hypertension: Secondary | ICD-10-CM | POA: Diagnosis not present

## 2020-12-20 DIAGNOSIS — M5442 Lumbago with sciatica, left side: Secondary | ICD-10-CM | POA: Diagnosis not present

## 2020-12-20 DIAGNOSIS — I480 Paroxysmal atrial fibrillation: Secondary | ICD-10-CM | POA: Diagnosis not present

## 2020-12-20 DIAGNOSIS — I472 Ventricular tachycardia: Secondary | ICD-10-CM

## 2020-12-20 LAB — CBC
HCT: 44.4 % (ref 36.0–46.0)
Hemoglobin: 14.6 g/dL (ref 12.0–15.0)
MCH: 30.7 pg (ref 26.0–34.0)
MCHC: 32.9 g/dL (ref 30.0–36.0)
MCV: 93.5 fL (ref 80.0–100.0)
Platelets: 210 10*3/uL (ref 150–400)
RBC: 4.75 MIL/uL (ref 3.87–5.11)
RDW: 13.4 % (ref 11.5–15.5)
WBC: 17.1 10*3/uL — ABNORMAL HIGH (ref 4.0–10.5)
nRBC: 0 % (ref 0.0–0.2)

## 2020-12-20 LAB — BASIC METABOLIC PANEL
Anion gap: 9 (ref 5–15)
BUN: 16 mg/dL (ref 6–20)
CO2: 26 mmol/L (ref 22–32)
Calcium: 9.4 mg/dL (ref 8.9–10.3)
Chloride: 104 mmol/L (ref 98–111)
Creatinine, Ser: 0.69 mg/dL (ref 0.44–1.00)
GFR, Estimated: >60 mL/min (ref >60)
Glucose, Bld: 191 mg/dL — ABNORMAL HIGH (ref 70–99)
Potassium: 3.7 mmol/L (ref 3.5–5.1)
Sodium: 139 mmol/L (ref 135–145)

## 2020-12-20 LAB — MAGNESIUM: Magnesium: 2.1 mg/dL (ref 1.7–2.4)

## 2020-12-20 LAB — ECHOCARDIOGRAM COMPLETE
Area-P 1/2: 3.42 cm2
Height: 63 in
S' Lateral: 2.8 cm
Weight: 2688 oz

## 2020-12-20 LAB — PHOSPHORUS: Phosphorus: 2.8 mg/dL (ref 2.5–4.6)

## 2020-12-20 MED ORDER — POTASSIUM CHLORIDE CRYS ER 20 MEQ PO TBCR
40.0000 meq | EXTENDED_RELEASE_TABLET | Freq: Once | ORAL | Status: AC
  Administered 2020-12-20: 40 meq via ORAL
  Filled 2020-12-20: qty 2

## 2020-12-20 MED ORDER — APIXABAN 2.5 MG PO TABS: 2.5000 mg | ORAL_TABLET | Freq: Two times a day (BID) | ORAL | 0 refills | Status: DC

## 2020-12-20 MED ORDER — METOPROLOL TARTRATE 25 MG PO TABS: 25.0000 mg | ORAL_TABLET | Freq: Three times a day (TID) | ORAL | 2 refills | Status: DC | PRN

## 2020-12-20 MED ORDER — APIXABAN 2.5 MG PO TABS
2.5000 mg | ORAL_TABLET | Freq: Two times a day (BID) | ORAL | Status: DC
  Administered 2020-12-20: 2.5 mg via ORAL
  Filled 2020-12-20: qty 1

## 2020-12-20 NOTE — TOC Transition Note (Signed)
Transition of Care [facility]) - CM/SW Discharge Note   Patient Details  Name: Kendra Nelson MRN: [id number] Date of Birth: Sep 23, 1960  [facility] CM/SW Contact:  Lanier Clam, RN Phone Number: 12/20/2020, 9:49 AM   Clinical Narrative: d/c home w/spouse-agree to HHc t accept insurance,& to provide services [facility] for [facility] HHPT ordered. Ordered dme but patient already has rw,3n1,cane,also a w/c. Spouse to transport home. No further CM needs.      Final next level of care: Home w Home Health Services Barriers to Discharge: No Barriers Identified   Patient Goals and CMS Choice Patient states their goals for this hospitalization and ongoing recovery are:: go home CMS Medicare.gov Compare Post Acute Care list provided to:: Patient Choice offered to / list presented to : Patient  Discharge Placement                       Discharge Plan and Services     Post Acute Care Choice: Home Health                      [facility] Agency: [facility] Date [facility] Agency Contacted: 12/20/20 Time HH Agency Contacted: [phone] Representative spoke with at [facility] Agency: Kathlene November  Social Determinants of Health (SDOH) Interventions     Readmission Risk Interventions No flowsheet data found.

## 2020-12-20 NOTE — Progress Notes (Signed)
This nurse has reviewed the patients AVS discharge instructions per Orthopedics request.   Patient verbalized understanded of all instructions, including follow up appointments, medication regiment, wound care, and personal care.   Patient has called her husband and will be wheeled down by this nurse when husband arrives

## 2020-12-20 NOTE — Progress Notes (Addendum)
Progress Note  Patient Name: Kendra Nelson Date of Encounter: 12/20/2020  [facility] Cardiologist: Parke Poisson, MD   Subjective   Feeling well. No chest pain, sob or palpitations.   Inpatient Medications    Scheduled Meds: . acetaminophen  500 mg Oral Q6H  . aspirin  81 mg Oral BID  . atorvastatin  20 mg Oral q AM  . celecoxib  200 mg Oral BID  . Chlorhexidine Gluconate Cloth  6 each Topical Daily  . dexamethasone (DECADRON) injection  10 mg Intravenous Once  . diltiazem  10 mg Intravenous Once  . docusate sodium  100 mg Oral BID  . lisinopril  40 mg Oral Daily  . mouth rinse  15 mL Mouth Rinse BID  . morphine  30 mg Oral Q12H  . pantoprazole  40 mg Oral Daily  . potassium chloride  40 mEq Oral Once  . rOPINIRole  1 mg Oral QHS   Continuous Infusions: . amiodarone 30 mg/hr (12/20/20 0619)  . dextrose 5 % and 0.45 % NaCl with KCl 20 mEq/L 10 mL/hr at 12/20/20 0300  . lactated ringers Stopped (12/19/20 1745)  . lactated ringers    . methocarbamol (ROBAXIN) IV     PRN Meds: acetaminophen, alum & mag hydroxide-simeth, bisacodyl, HYDROcodone-acetaminophen, HYDROcodone-acetaminophen, menthol-cetylpyridinium **OR** phenol, methocarbamol **OR** methocarbamol (ROBAXIN) IV, metoCLOPramide **OR** metoCLOPramide (REGLAN) injection, morphine injection, ondansetron **OR** ondansetron (ZOFRAN) IV, polyethylene glycol, sodium phosphate   Vital Signs    Vitals:   12/20/20 0300 12/20/20 0400 12/20/20 0500 12/20/20 0600  BP: (!) 147/58 (!) 142/61 (!) 148/63 140/66  Pulse: (!) 58 (!) 52 (!) 54 (!) 52  Resp: 15 11 12 13   Temp: 98.3 F (36.8 C)     TempSrc: Oral     SpO2: 97% 94% 94% 95%  Weight:      Height:        Intake/Output Summary (Last 24 hours) at 12/20/2020 0904 Last data filed at 12/20/2020 0300 Gross per 24 hour  Intake 1586.79 ml  Output 1100 ml  Net 486.79 ml   Last 3 Weights 12/19/2020 11/12/2020 08/15/2020  Weight (lbs) 168 lb 170 lb 170 lb   Weight (kg) 76.204 kg 77.111 kg 77.111 kg      Telemetry    Sinus bradycardia/rhythm, PVCS- Personally Reviewed  ECG  N/A  Physical Exam   GEN: No acute distress.   Neck: No JVD Cardiac: RRR, no murmurs, rubs, or gallops.  Respiratory: Clear to auscultation bilaterally. GI: Soft, nontender, non-distended  MS: No edema; wrapped L leg Neuro:  Nonfocal  Psych: Normal affect   Labs    High Sensitivity Troponin:  No results for input(s): TROPONINIHS in the last 720 hours.    Chemistry Recent Labs  Lab 12/19/20 0006 12/19/20 1548 12/20/20 0237  NA 141 141 139  K 4.1 3.9 3.7  CL 107 106 104  CO2  --  25 26  GLUCOSE 97 156* 191*  BUN 17 10 16   CREATININE 0.70 0.57 0.69  CALCIUM  --  9.1 9.4  PROT  --  7.7  --   ALBUMIN  --  4.1  --   AST  --  27  --   ALT  --  26  --   ALKPHOS  --  108  --   BILITOT  --  0.7  --   GFRNONAA  --  >60 >60  ANIONGAP  --  10 9     Hematology Recent Labs  Lab 12/19/20 0006 12/20/20 0237  WBC  --  17.1*  RBC  --  4.75  HGB 14.6 14.6  HCT 43.0 44.4  MCV  --  93.5  MCH  --  30.7  MCHC  --  32.9  RDW  --  13.4  PLT  --  210    BNPNo results for input(s): BNP, PROBNP in the last 168 hours.   DDimer No results for input(s): DDIMER in the last 168 hours.   Radiology    CT ANGIO LOW EXTREM LEFT W &/OR WO CONTRAST  Result Date: 12/19/2020 CLINICAL DATA:  Recent dislocation of the knee prosthesis with pain, initial encounter EXAM: CT ANGIOGRAPHY OF THE LEFT LOWEREXTREMITY TECHNIQUE: Multidetector CT imaging of the left lowerwas performed using the standard protocol during bolus administration of intravenous contrast. Multiplanar CT image reconstructions and MIPs were obtained to evaluate the vascular anatomy. CONTRAST:  OMNIPAQUE IOHEXOL 350 MG/ML SOLN COMPARISON:  Plain film from earlier in the same day. FINDINGS: Vascular: Distal abdominal aorta and iliac vessels are well visualized bilaterally. Right lower extremity: Right  lower extremity is included in this imaging due to a wide field of view. The common femoral, superficial femoral and popliteal artery appear within normal limits. The popliteal trifurcation is patent with three-vessel runoff to the level of the right ankle. Left lower extremity: Common femoral and superficial femoral artery are widely patent. Popliteal artery is well visualized within normal popliteal trifurcation. A portion of the mid popliteal artery is not visualized for a few images due to scatter artifact from the knee prosthesis. Despite this the distal aspect of the popliteal artery demonstrates a normal caliber similar to that seen in the proximal popliteal artery. Three-vessel runoff is noted to the level of the left ankle. No focal area of extravasation is noted. Nonvascular: Posterior displacement of the tibial component with respect to the femoral component is noted consistent with that seen on recent plain film examination. No other acute bony abnormality is noted. Postsurgical changes in the lumbar spine are noted. Visualized visceral structures are unremarkable. Stimulator packs are noted over the anterior lower pelvic wall. Bladder is within normal limits. No muscular abnormality is seen. No focal hematoma is noted. Of the MIP images confirms the above findings. IMPRESSION: Changes consistent with posterior subluxation/dislocation of the tibial component with respect to the femoral component of the knee prosthesis on the left. Three-vessel runoff bilaterally without evidence of definitive arterial injury. No soft tissue hematoma is noted. Electronically Signed   By: Alcide Clever M.D.   On: 12/19/2020 01:58   DG Knee Complete 4 Views Left  Result Date: 12/18/2020 CLINICAL DATA:  Painful knee with deformity EXAM: LEFT KNEE - COMPLETE 4+ VIEW COMPARISON:  04/12/2010 FINDINGS: Status post left knee replacement. There is posterior dislocation of the tibial component with respect to the femoral  component on lateral view. No significant knee effusion. No fracture is seen. IMPRESSION: Status post left knee replacement with posterior dislocation of the tibial component with respect to the femoral component. Electronically Signed   By: Jasmine Pang M.D.   On: 12/18/2020 22:54    Cardiac Studies   Pending echo   Patient Profile     60 y.o. female  with a hx of HTN, HLD, chronic back pain with hx spinal cord stimulator, palpitations, tobacco use seen for post operative aflutter/afib and NSVT.   Assessment & Plan    1. Atrial flutter with RVR - Noted 2:1 atrial flutter with some  variable rate. Some afib features. Asymptomatic.  - Converted to sinus rhythm on IV amiodarone midnight. Currently maintaining sinus rhythm at 50-60s. Still have 9 hours of IV amiodarone remain, will continue. Likely po amiodarone at discharge and outpatient monitor.  - Pending echo   2. NSVT - Longest run of nonsustained ventricular tachycardia of 30 beats - frequent PVCs on tele - Keep k > 4 (will give Kdur x1) and Mg > 2 - Amiodarone and monitor as above  Likely DC post echo. Dr. Jacques Navy to see.   For questions or updates, please contact [facility] Please consult [email or web address] for contact info under        Lorelei Pont, PA  12/20/2020, 9:04 AM    Patient seen and examined with [facility] PA.  Agree as above, with the following exceptions and changes as noted below.  She feels well and wants to go home.  Converted to sinus rhythm overnight. Gen: NAD, CV: RRR, no murmurs, Lungs: clear, Abd: soft, Extrem: Warm,  no edema, Neuro/Psych: alert and oriented x 3, normal mood and affect. All available labs, radiology testing, previous records reviewed.    Short-lived rapid atrial flutter postoperatively.  Suspect this was situational, however the patient does complain of palpitations at home and was awaiting a cardiology referral.  She also has PVCs on an old ECG and had concerning  stretches of nonsustained VT approximately 30 beats maximum in the PACU.  Recommendations: -Await echocardiogram for structural heart disease.  If normal, no further cardiac testing required in hospital and patient is stable for discharge. -Please provide patient with a prescription for metoprolol tartrate 25 mg prn palpitations.  Her heart rate is already 60 and she may not tolerate daily dosing. -At this point in time I do not think she needs home-going amiodarone given that her episode was short-lived. -CHADS2 vasc is 2, however there are some contraindications to full dose anticoagulation due to postoperative status.  If surgical team can define for [place] when it would be safe to start full dose anticoagulation, if this is needed in the future we will implement this at outpatient follow-up. -To better understand her burden of atrial flutter, PVCs, and nonsustained VT, we will place a 30-day event monitor, which will get mailed to her house.  Parke Poisson, MD 12/20/20 10:18 AM

## 2020-12-20 NOTE — Evaluation (Signed)
Physical Therapy Evaluation Patient Details Name: Kendra Nelson MRN: [id number] DOB: [birth year] Today's Date: 12/20/2020   History of Present Illness  Patient is a 60 years old female with history of hypertension, hyperlipidemia, chronic back pain with history of a spinal cord stimulator and tobacco use disorder presented to the hospital for total left knee replacement 12/19/20.  Patient does have history of chronic back pain and is on oral morphine and has a history of spinal cord stimulator.  At the PACU patient was noted to have narrow complex tachycardia with heart rate more than 200 and runs of V. tach.  Clinical Impression  The patient is eager to ambulate. Ambulated x 150' with Rw. HR max 67. Patient has DME. Plans HHPT per patient. Pt admitted with above diagnosis.  Pt currently with functional limitations due to the deficits listed below (see PT Problem List). Pt will benefit from skilled PT to increase their independence and safety with mobility to allow discharge to the venue listed below.      Follow Up Recommendations Follow surgeon's recommendation for DC plan and follow-up therapies    Equipment Recommendations  None recommended by PT    Recommendations for Other Services       Precautions / Restrictions Precautions Precautions: Knee;Fall Precaution Comments: HR Restrictions Weight Bearing Restrictions: No      Mobility  Bed Mobility Overal bed mobility: Needs Assistance Bed Mobility: Supine to Sit     Supine to sit: Supervision          Transfers Overall transfer level: Needs assistance   Transfers: Sit to/from Stand Sit to Stand: Supervision         General transfer comment: cues for safety  Ambulation/Gait Ambulation/Gait assistance: Supervision Gait Distance (Feet): 150 Feet Assistive device: Rolling walker (2 wheeled) Gait Pattern/deviations: Step-through pattern     General Gait Details: cues for safety, releases Rw at times to 'Talk  with hands"  Stairs            Wheelchair Mobility    Modified Rankin (Stroke Patients Only)       Balance     Sitting balance-Leahy Scale: Normal     Standing balance support: During functional activity;Bilateral upper extremity supported Standing balance-Leahy Scale: Good Standing balance comment: with Rw                             Pertinent Vitals/Pain Pain Assessment: 0-10 Pain Score: 4  Pain Location: left knee Pain Descriptors / Indicators: Discomfort Pain Intervention(s): Monitored during session;Premedicated before session    Home Living Family/patient expects to be discharged to:: Private residence Living Arrangements: Spouse/significant other;Children Available Help at Discharge: Family;Available 24 hours/day Type of Home: House Home Access: Stairs to enter   [employer] of Steps: 1 Home Layout: Able to live on main level with bedroom/bathroom Home Equipment: Wheelchair - manual;Cane - single point;Crutches      Prior Function Level of Independence: Independent               Hand Dominance        Extremity/Trunk Assessment   Upper Extremity Assessment Upper Extremity Assessment: Overall WFL for tasks assessed    Lower Extremity Assessment Lower Extremity Assessment: LLE deficits/detail LLE Deficits / Details: + SLR, knee flexion 5-60    Cervical / Trunk Assessment Cervical / Trunk Assessment: Normal  Communication   Communication: No difficulties  Cognition Arousal/Alertness: Awake/alert Behavior During Therapy: WFL for tasks  assessed/performed Overall Cognitive Status: Within Functional Limits for tasks assessed                                        General Comments      Exercises     Assessment/Plan    PT Assessment Patient needs continued PT services  PT Problem List Decreased strength;Decreased mobility;Decreased safety awareness;Decreased range of motion;Decreased knowledge  of precautions;Decreased activity tolerance;Cardiopulmonary status limiting activity;Pain       PT Treatment Interventions DME instruction;Therapeutic activities;Gait training;Therapeutic exercise;Patient/family education;Functional mobility training    PT Goals (Current goals can be found in the Care Plan section)  Acute Rehab PT Goals Patient Stated Goal: go home PT Goal Formulation: With patient Time For Goal Achievement: 12/27/20 Potential to Achieve Goals: Good    Frequency 7X/week   Barriers to discharge        Co-evaluation               AM-PAC PT "6 Clicks" Mobility  Outcome Measure Help needed turning from your back to your side while in a flat bed without using bedrails?: None Help needed moving from lying on your back to sitting on the side of a flat bed without using bedrails?: None Help needed moving to and from a bed to a chair (including a wheelchair)?: A Little Help needed standing up from a chair using your arms (e.g., wheelchair or bedside chair)?: A Little Help needed to walk in hospital room?: A Little Help needed climbing 3-5 steps with a railing? : A Little 6 Click Score: 20    End of Session Equipment Utilized During Treatment: Gait belt Activity Tolerance: Patient tolerated treatment well Patient left: in chair Nurse Communication: Mobility status PT Visit Diagnosis: Difficulty in walking, not elsewhere classified (R26.2)    Time: 1235-1310 PT Time Calculation (min) (ACUTE ONLY): 35 min   Charges:     PT Treatments $Gait Training: 8-22 mins Low evaluation       12/20/2020  Blanchard Kelch PT Acute Rehabilitation Services Pager [phone] Office [phone]    Rada Hay 12/20/2020, 1:32 PM

## 2020-12-20 NOTE — Progress Notes (Signed)
Will schedule monitor

## 2020-12-20 NOTE — Progress Notes (Signed)
PROGRESS NOTE  Kendra Nelson:[id number] DOB: June 26, 1961 DOA: 12/18/2020 PCP: Iona Hansen, NP   LOS: 1 day   Brief narrative: Patient is a 60 years old female with history of hypertension, hyperlipidemia, chronic back pain with history of a spinal cord stimulator and tobacco use disorder presented to the hospital for total left knee replacement.  Patient does have history of chronic back pain and is on oral morphine and has a history of spinal cord stimulator.  At the PACU patient was noted to have narrow complex tachycardia with heart rate more than 200 and runs of V. tach.  Patient was asymptomatic at that time and received 50 mg IV was followed in the heart rate did slightly improve.  Patient also received metoprolol and subsequently rhythm was atrial fibrillation with RVR.  Cardiology was consulted for this rhythm.  Assessment/Plan:  Principal Problem:   S/P total knee arthroplasty, left Active Problems:   Benign essential hypertension   Pure hypercholesterolemia   Chronic pain syndrome   Chronic low back pain (Primary Area of Pain) (Bilateral) (R>L)  Status post revision of left total knee arthroplasty.  Management as per primary team.  Patient with a history of left total knee arthroplasty in April 2019.  Narrow complex tachycardia-A.  Flutter with 2:1 block/fibrillation with concerns for nonsustained ventricular tachycardia.  Cardiology on board and is on amiodarone drip.  Plan is p.o. amiodarone and as needed metoprolol on discharge with outpatient monitor.  History of essential hypertension.   On lisinopril at home.  Continue lisinopril while in the hospital.  History of hyperlipidemia Patient takes Lipitor at home.   History of chronic back pain. chronic pain syndrome. Multiple back surgeries and history of spinal cord stimulator.  Follows up with pain clinic.  On morphine as outpatient.    Continue MS Contin  DVT prophylaxis: SCDs Start: 12/19/20 1707 Place TED  hose Start: 12/19/20 1707    Code Status: Full code  Family Communication: None today  Status is: Inpatient   Consultants:  Cardiology  Procedures:  Left total knee arthroplasty  Anti-infectives:  . none  Anti-infectives (From admission, onward)   Start     Dose/Rate Route Frequency Ordered Stop   12/19/20 1030  ceFAZolin (ANCEF) IVPB 2g/100 mL premix        2 g 200 mL/hr over 30 Minutes Intravenous On call to O.R. 12/19/20 0904 12/19/20 1256       Subjective: Today, patient was seen and examined at bedside.  Patient denies overt pain nausea vomiting fever or chest pain or palpitation.  No shortness of breath.  Wants to go home  Objective: Vitals:   12/20/20 0900 12/20/20 1030  BP: (!) 152/58 (!) 154/73  Pulse: (!) 55   Resp: 15   Temp:    SpO2: 95%     Intake/Output Summary (Last 24 hours) at 12/20/2020 1100 Last data filed at 12/20/2020 0300 Gross per 24 hour  Intake 1586.79 ml  Output 1100 ml  Net 486.79 ml   Filed Weights   12/19/20 1108  Weight: 76.2 kg   Body mass index is 29.76 kg/m.   Physical Exam: GENERAL: Patient is alert awake and oriented. Not in obvious distress.  On room air.   HENT: No scleral pallor or icterus. Pupils equally reactive to light. Oral mucosa is moist NECK: is supple, no gross swelling noted. CHEST: Clear to auscultation. No crackles or wheezes.  Diminished breath sounds bilaterally. CVS: S1 and S2 heard, no murmur.  Irregular rhythm ABDOMEN: Soft, non-tender, bowel sounds are present. EXTREMITIES: Status post left knee surgery with brace CNS: Cranial nerves are intact. No focal motor deficits. SKIN: warm and dry without rashes.  Data Review: I have personally reviewed the following laboratory data and studies,  CBC: Recent Labs  Lab 12/19/20 0006 12/20/20 0237  WBC  --  17.1*  HGB 14.6 14.6  HCT 43.0 44.4  MCV  --  93.5  PLT  --  210   Basic Metabolic Panel: Recent Labs  Lab 12/19/20 0006  12/19/20 1548 12/20/20 0237  NA 141 141 139  K 4.1 3.9 3.7  CL 107 106 104  CO2  --  25 26  GLUCOSE 97 156* 191*  BUN CREATININE 0.70 0.57 0.69  CALCIUM  --  9.1 9.4  MG  --  2.0 2.1  PHOS  --   --  2.8   Liver Function Tests: Recent Labs  Lab 12/19/20 1548  AST 27  ALT 26  ALKPHOS 108  BILITOT 0.7  PROT 7.7  ALBUMIN 4.1   No results for input(s): LIPASE, AMYLASE in the last 168 hours. No results for input(s): AMMONIA in the last 168 hours. Cardiac Enzymes: No results for input(s): CKTOTAL, CKMB, CKMBINDEX, TROPONINI in the last 168 hours. BNP (last 3 results) No results for input(s): BNP in the last 8760 hours.  ProBNP (last 3 results) No results for input(s): PROBNP in the last 8760 hours.  CBG: No results for input(s): GLUCAP in the last 168 hours. Recent Results (from the past 240 hour(s))  Resp Panel by RT-PCR (Flu A&B, Covid) Nasopharyngeal Swab     Status: None   Collection Time: 12/18/20 11:50 PM   Specimen: Nasopharyngeal Swab; Nasopharyngeal(NP) swabs in vial transport medium  Result Value Ref Range Status   SARS Coronavirus 2 by RT PCR NEGATIVE NEGATIVE Final    Comment: (NOTE) SARS-CoV-2 target nucleic acids are NOT DETECTED.  The SARS-CoV-2 RNA is generally detectable in upper respiratory specimens during the acute phase of infection. The lowest concentration of SARS-CoV-2 viral copies this assay can detect is 138 copies/mL. A negative result does not preclude SARS-Cov-2 infection and should not be used as the sole basis for treatment or other patient management decisions. A negative result may occur with  improper specimen collection/handling, submission of specimen other than nasopharyngeal swab, presence of viral mutation(s) within the areas targeted by this assay, and inadequate number of viral copies(<138 copies/mL). A negative result must be combined with clinical observations, patient history, and epidemiological information. The  expected result is Negative.  Fact Sheet for Patients:  BloggerCourse.com  Fact Sheet for Healthcare Providers:  SeriousBroker.it  This test is no t yet approved or cleared by the [place] FDA and  has been authorized for detection and/or diagnosis of SARS-CoV-2 by FDA under an Emergency Use Authorization (EUA). This EUA will remain  in effect (meaning this test can be used) for the duration of the COVID-19 declaration under Section 564(b)(1) of the Act, 21 U.S.C.section 360bbb-3(b)(1), unless the authorization is terminated  or revoked sooner.       Influenza A by PCR NEGATIVE NEGATIVE Final   Influenza B by PCR NEGATIVE NEGATIVE Final    Comment: (NOTE) The Xpert Xpress SARS-CoV-2/FLU/RSV plus assay is intended as an aid in the diagnosis of influenza from Nasopharyngeal swab specimens and should not be used as a sole basis for treatment. Nasal washings and aspirates are unacceptable for Xpert Xpress SARS-CoV-2/FLU/RSV  testing.  Fact Sheet for Patients: BloggerCourse.com  Fact Sheet for Healthcare Providers: SeriousBroker.it  This test is not yet approved or cleared by the [place] FDA and has been authorized for detection and/or diagnosis of SARS-CoV-2 by FDA under an Emergency Use Authorization (EUA). This EUA will remain in effect (meaning this test can be used) for the duration of the COVID-19 declaration under Section 564(b)(1) of the Act, 21 U.S.C. section 360bbb-3(b)(1), unless the authorization is terminated or revoked.  Performed at [facility], [address], [place], [place] [address]   MRSA PCR Screening     Status: None   Collection Time: 12/19/20  4:53 PM   Specimen: Nasal Mucosa; Nasopharyngeal  Result Value Ref Range Status   MRSA by PCR NEGATIVE NEGATIVE Final    Comment:        The GeneXpert MRSA Assay (FDA approved for  NASAL specimens only), is one component of a comprehensive MRSA colonization surveillance program. It is not intended to diagnose MRSA infection nor to guide or monitor treatment for MRSA infections. Performed at [facility], [address] [place], [place] [address]      Studies: CT ANGIO LOW EXTREM LEFT W &/OR WO CONTRAST  Result Date: 12/19/2020 CLINICAL DATA:  Recent dislocation of the knee prosthesis with pain, initial encounter EXAM: CT ANGIOGRAPHY OF THE LEFT LOWEREXTREMITY TECHNIQUE: Multidetector CT imaging of the left lowerwas performed using the standard protocol during bolus administration of intravenous contrast. Multiplanar CT image reconstructions and MIPs were obtained to evaluate the vascular anatomy. CONTRAST:  OMNIPAQUE IOHEXOL 350 MG/ML SOLN COMPARISON:  Plain film from earlier in the same day. FINDINGS: Vascular: Distal abdominal aorta and iliac vessels are well visualized bilaterally. Right lower extremity: Right lower extremity is included in this imaging due to a wide field of view. The common femoral, superficial femoral and popliteal artery appear within normal limits. The popliteal trifurcation is patent with three-vessel runoff to the level of the right ankle. Left lower extremity: Common femoral and superficial femoral artery are widely patent. Popliteal artery is well visualized within normal popliteal trifurcation. A portion of the mid popliteal artery is not visualized for a few images due to scatter artifact from the knee prosthesis. Despite this the distal aspect of the popliteal artery demonstrates a normal caliber similar to that seen in the proximal popliteal artery. Three-vessel runoff is noted to the level of the left ankle. No focal area of extravasation is noted. Nonvascular: Posterior displacement of the tibial component with respect to the femoral component is noted consistent with that seen on recent plain film examination. No  other acute bony abnormality is noted. Postsurgical changes in the lumbar spine are noted. Visualized visceral structures are unremarkable. Stimulator packs are noted over the anterior lower pelvic wall. Bladder is within normal limits. No muscular abnormality is seen. No focal hematoma is noted. Of the MIP images confirms the above findings. IMPRESSION: Changes consistent with posterior subluxation/dislocation of the tibial component with respect to the femoral component of the knee prosthesis on the left. Three-vessel runoff bilaterally without evidence of definitive arterial injury. No soft tissue hematoma is noted. Electronically Signed   By: Alcide Clever M.D.   On: 12/19/2020 01:58   DG Knee Complete 4 Views Left  Result Date: 12/18/2020 CLINICAL DATA:  Painful knee with deformity EXAM: LEFT KNEE - COMPLETE 4+ VIEW COMPARISON:  04/12/2010 FINDINGS: Status post left knee replacement. There is posterior dislocation of the tibial component with  respect to the femoral component on lateral view. No significant knee effusion. No fracture is seen. IMPRESSION: Status post left knee replacement with posterior dislocation of the tibial component with respect to the femoral component. Electronically Signed   By: Jasmine Pang M.D.   On: 12/18/2020 22:54      Joycelyn Das, MD  [facility] 12/20/2020  If 7PM-7AM, please contact night-coverage

## 2020-12-20 NOTE — Progress Notes (Addendum)
PATIENT ID: Kendra Nelson  MRN: [id number]  DOB/AGE:  05/10/1961 / 60 y.o. y.o.  1 Day Post-Op Procedure(s) (LRB): TOTAL KNEE REVISION/ Total Knee Polynethylene Barring, Attune Total Knee (Left)    PROGRESS NOTE Subjective: Patient is alert, oriented, no Nausea, no Vomiting, yes passing gas. Taking PO well. Denies SOB, Chest or Calf Pain. Using Incentive Spirometer, PAS in place. Ambulate WBAT, Patient reports pain as 5/10 .    Objective: Vital signs in last 24 hours: Vitals:   12/20/20 0300 12/20/20 0400 12/20/20 0500 12/20/20 0600  BP: (!) 147/58 (!) 142/61 (!) 148/63 140/66  Pulse: (!) 58 (!) 52 (!) 54 (!) 52  Resp: 15 11 12 13   Temp: 98.3 F (36.8 C)     TempSrc: Oral     SpO2: 97% 94% 94% 95%  Weight:      Height:          Intake/Output from previous day: I/O last 3 completed shifts: In: 1586.8 [I.V.:1386.8; IV Piggyback:200] Out: 1100 [Urine:1000; Blood:100]   Intake/Output this shift: No intake/output data recorded.   LABORATORY DATA: Recent Labs    12/19/20 0006 12/19/20 0006 12/19/20 0917 12/19/20 1548 12/20/20 0237  WBC  --   --   --   --  17.1*  HGB 14.6  --   --   --  14.6  HCT 43.0  --   --   --  44.4  PLT  --   --   --   --  210  NA 141  --   --  141 139  K 4.1  --   --  3.9 3.7  CL 107  --   --  106 104  CO2  --   --   --  25 26  BUN 17  --   --  10 16  CREATININE 0.70  --   --  0.57 0.69  GLUCOSE 97  --   --  156* 191*  INR  --   --  1.0  --   --   CALCIUM  --    < >  --  9.1 9.4   < > = values in this interval not displayed.    Examination: Neurologically intact Neurovascular intact Sensation intact distally Intact pulses distally Dorsiflexion/Plantar flexion intact Incision: dressing C/D/I and no drainage No cellulitis present Compartment soft}  Assessment:   1 Day Post-Op Procedure(s) (LRB): TOTAL KNEE REVISION/ Total Knee Polynethylene Barring, Attune Total Knee (Left) ADDITIONAL DIAGNOSIS: Expected Acute Blood Loss Anemia,  Hypertension and Cardiac Arrythmia atrial flutter/fib Anticipated LOS equal to or greater than 2 midnights due to - Age 14 and older with one or more of the following:  - Obesity  - Expected need for hospital services (PT, OT, Nursing) required for safe  discharge  - Anticipated need for postoperative skilled nursing care or inpatient rehab  - Active co-morbidities: cardiac arrythmia    Plan: PT/OT WBAT, AROM and PROM  DVT Prophylaxis:  [id number], Eliquis 2.5 mg BID x 2 weeks ok to switch to full dose after the 2 weeks, though this may change with recommendation from cardiology DISCHARGE PLAN: Home, when cleared by cardiology DISCHARGE NEEDS: HHPT, Walker and 3-in-1 comode seat We appreciate the care provided by the cardiology and hospitalist teams.    76 12/20/2020, 7:19 AM

## 2020-12-20 NOTE — Progress Notes (Signed)
  Echocardiogram 2D Echocardiogram has been performed.  Kendra Nelson 12/20/2020, 2:20 PM

## 2020-12-23 ENCOUNTER — Encounter: Payer: Self-pay | Admitting: *Deleted

## 2020-12-23 NOTE — Progress Notes (Signed)
Enrolled pt for [employer] to mail 30 day monitor. Will mail letter of instructions since pt doesn't have mychart.

## 2020-12-24 DIAGNOSIS — T84023D Instability of internal left knee prosthesis, subsequent encounter: Secondary | ICD-10-CM | POA: Diagnosis not present

## 2020-12-24 DIAGNOSIS — M5134 Other intervertebral disc degeneration, thoracic region: Secondary | ICD-10-CM | POA: Diagnosis not present

## 2020-12-24 DIAGNOSIS — M5136 Other intervertebral disc degeneration, lumbar region: Secondary | ICD-10-CM | POA: Diagnosis not present

## 2020-12-24 DIAGNOSIS — E7800 Pure hypercholesterolemia, unspecified: Secondary | ICD-10-CM | POA: Diagnosis not present

## 2020-12-24 DIAGNOSIS — G2581 Restless legs syndrome: Secondary | ICD-10-CM | POA: Diagnosis not present

## 2020-12-24 DIAGNOSIS — G894 Chronic pain syndrome: Secondary | ICD-10-CM | POA: Diagnosis not present

## 2020-12-24 DIAGNOSIS — M5417 Radiculopathy, lumbosacral region: Secondary | ICD-10-CM | POA: Diagnosis not present

## 2020-12-24 DIAGNOSIS — Z7901 Long term (current) use of anticoagulants: Secondary | ICD-10-CM | POA: Diagnosis not present

## 2020-12-24 DIAGNOSIS — Z471 Aftercare following joint replacement surgery: Secondary | ICD-10-CM | POA: Diagnosis not present

## 2020-12-24 DIAGNOSIS — R69 Illness, unspecified: Secondary | ICD-10-CM | POA: Diagnosis not present

## 2020-12-24 DIAGNOSIS — M069 Rheumatoid arthritis, unspecified: Secondary | ICD-10-CM | POA: Diagnosis not present

## 2020-12-24 DIAGNOSIS — L409 Psoriasis, unspecified: Secondary | ICD-10-CM | POA: Diagnosis not present

## 2020-12-24 DIAGNOSIS — G43909 Migraine, unspecified, not intractable, without status migrainosus: Secondary | ICD-10-CM | POA: Diagnosis not present

## 2020-12-24 DIAGNOSIS — Z87891 Personal history of nicotine dependence: Secondary | ICD-10-CM | POA: Diagnosis not present

## 2020-12-24 DIAGNOSIS — I10 Essential (primary) hypertension: Secondary | ICD-10-CM | POA: Diagnosis not present

## 2020-12-26 DIAGNOSIS — L409 Psoriasis, unspecified: Secondary | ICD-10-CM | POA: Diagnosis not present

## 2020-12-26 DIAGNOSIS — M5134 Other intervertebral disc degeneration, thoracic region: Secondary | ICD-10-CM | POA: Diagnosis not present

## 2020-12-26 DIAGNOSIS — M069 Rheumatoid arthritis, unspecified: Secondary | ICD-10-CM | POA: Diagnosis not present

## 2020-12-26 DIAGNOSIS — E7800 Pure hypercholesterolemia, unspecified: Secondary | ICD-10-CM | POA: Diagnosis not present

## 2020-12-26 DIAGNOSIS — G43909 Migraine, unspecified, not intractable, without status migrainosus: Secondary | ICD-10-CM | POA: Diagnosis not present

## 2020-12-26 DIAGNOSIS — G2581 Restless legs syndrome: Secondary | ICD-10-CM | POA: Diagnosis not present

## 2020-12-26 DIAGNOSIS — Z87891 Personal history of nicotine dependence: Secondary | ICD-10-CM | POA: Diagnosis not present

## 2020-12-26 DIAGNOSIS — R69 Illness, unspecified: Secondary | ICD-10-CM | POA: Diagnosis not present

## 2020-12-26 DIAGNOSIS — I10 Essential (primary) hypertension: Secondary | ICD-10-CM | POA: Diagnosis not present

## 2020-12-26 DIAGNOSIS — T84023D Instability of internal left knee prosthesis, subsequent encounter: Secondary | ICD-10-CM | POA: Diagnosis not present

## 2020-12-26 DIAGNOSIS — M5417 Radiculopathy, lumbosacral region: Secondary | ICD-10-CM | POA: Diagnosis not present

## 2020-12-26 DIAGNOSIS — M5136 Other intervertebral disc degeneration, lumbar region: Secondary | ICD-10-CM | POA: Diagnosis not present

## 2020-12-26 DIAGNOSIS — Z7901 Long term (current) use of anticoagulants: Secondary | ICD-10-CM | POA: Diagnosis not present

## 2020-12-26 DIAGNOSIS — G894 Chronic pain syndrome: Secondary | ICD-10-CM | POA: Diagnosis not present

## 2020-12-27 ENCOUNTER — Telehealth: Payer: Self-pay | Admitting: Internal Medicine

## 2020-12-27 ENCOUNTER — Ambulatory Visit (INDEPENDENT_AMBULATORY_CARE_PROVIDER_SITE_OTHER): Payer: Medicare HMO

## 2020-12-27 DIAGNOSIS — I4729 Other ventricular tachycardia: Secondary | ICD-10-CM

## 2020-12-27 DIAGNOSIS — I484 Atypical atrial flutter: Secondary | ICD-10-CM

## 2020-12-27 DIAGNOSIS — I472 Ventricular tachycardia: Secondary | ICD-10-CM

## 2020-12-27 MED ORDER — METOPROLOL SUCCINATE ER 25 MG PO TB24: 25.0000 mg | ORAL_TABLET | Freq: Every day | ORAL | 3 refills | Status: DC

## 2020-12-27 MED ORDER — ELIQUIS 5 MG PO TABS: 5.0000 mg | ORAL_TABLET | Freq: Two times a day (BID) | ORAL | 11 refills | Status: DC

## 2020-12-27 NOTE — Telephone Encounter (Signed)
Spoke with Kendra Nelson ([employer]) Baseline: atrial flutter - HR up to 130bpm  Monitor ordered at hosptial discharge Consult note from Jacques Navy MD Kendra Nelson with hx of HTN, and chronic back pain and now total knee today.  Post op on arrival to PACU pt with narrow complex tachycardia with HR >200 and runs of V tach.  She was asymptomatic and given 50 mg of esmolol and HR to 150s.  Then 5 mg IV metoprolol given with HR to 130 to 140s. At this point a fib with RVR.    _________________ Called patient who has no acute issues.   Her Physical Therapist told her to take med like PCP advised (as below) Patient said her PCP told her to take metoprolol tartrate 12.5mg  TID regardless of what hospital prescribed which is 25mg  TID PRN for palps She has been taking per PCP for about 1.5 days  She reports elevated BP readings 165/88 HR 56 190/88 HR 63  Her HR was in the 60s in hospital thus PRN dose was prescribed

## 2020-12-27 NOTE — Telephone Encounter (Signed)
   Kendra Nelson with [employer] to report critical heart monitor result

## 2020-12-27 NOTE — Telephone Encounter (Signed)
Monitor report and other notes (below) reviewed by DOD Dr. Jens Som  He would have patient ADD metoprolol succinate 25mg  daily He would increase eliquis 5mg  BID  ** still has PRN metoprolol tartrate but advised she will NOT take this on a scheduled basis  Relayed this information to patient and explained changes and reasoning. Rx(s) sent to pharmacy electronically.  Explained that if any other urgent alerts are received we will let her know also  Routed to MD/RN as 

## 2020-12-29 ENCOUNTER — Telehealth: Payer: Self-pay | Admitting: Internal Medicine

## 2020-12-29 NOTE — Telephone Encounter (Signed)
Received a call from [employer]. Ms. Kendra Nelson had two episodes of NSVT this evening, one lasting 6 beats and one lasting 8 beats. On a follow-up call, she reported that she was resting at home and did feel some palpitations. She otherwise feels well and is back in her chronic A. Fib. Instructed to call for NSVT lasting >12 beats. Will update Dr.Acharya.  Feliberto Harts, MD Cardiology [facility]

## 2020-12-30 ENCOUNTER — Telehealth: Payer: Self-pay

## 2020-12-30 DIAGNOSIS — M5136 Other intervertebral disc degeneration, lumbar region: Secondary | ICD-10-CM | POA: Diagnosis not present

## 2020-12-30 DIAGNOSIS — M5417 Radiculopathy, lumbosacral region: Secondary | ICD-10-CM | POA: Diagnosis not present

## 2020-12-30 DIAGNOSIS — Z7901 Long term (current) use of anticoagulants: Secondary | ICD-10-CM | POA: Diagnosis not present

## 2020-12-30 DIAGNOSIS — G43909 Migraine, unspecified, not intractable, without status migrainosus: Secondary | ICD-10-CM | POA: Diagnosis not present

## 2020-12-30 DIAGNOSIS — T84023D Instability of internal left knee prosthesis, subsequent encounter: Secondary | ICD-10-CM | POA: Diagnosis not present

## 2020-12-30 DIAGNOSIS — M5134 Other intervertebral disc degeneration, thoracic region: Secondary | ICD-10-CM | POA: Diagnosis not present

## 2020-12-30 DIAGNOSIS — G894 Chronic pain syndrome: Secondary | ICD-10-CM | POA: Diagnosis not present

## 2020-12-30 DIAGNOSIS — M069 Rheumatoid arthritis, unspecified: Secondary | ICD-10-CM | POA: Diagnosis not present

## 2020-12-30 DIAGNOSIS — Z87891 Personal history of nicotine dependence: Secondary | ICD-10-CM | POA: Diagnosis not present

## 2020-12-30 DIAGNOSIS — E7800 Pure hypercholesterolemia, unspecified: Secondary | ICD-10-CM | POA: Diagnosis not present

## 2020-12-30 DIAGNOSIS — L409 Psoriasis, unspecified: Secondary | ICD-10-CM | POA: Diagnosis not present

## 2020-12-30 DIAGNOSIS — R69 Illness, unspecified: Secondary | ICD-10-CM | POA: Diagnosis not present

## 2020-12-30 DIAGNOSIS — I10 Essential (primary) hypertension: Secondary | ICD-10-CM | POA: Diagnosis not present

## 2020-12-30 DIAGNOSIS — G2581 Restless legs syndrome: Secondary | ICD-10-CM | POA: Diagnosis not present

## 2020-12-30 MED ORDER — METOPROLOL SUCCINATE ER 25 MG PO TB24: 50.0000 mg | ORAL_TABLET | Freq: Every day | ORAL | 3 refills | Status: DC

## 2020-12-30 NOTE — Telephone Encounter (Signed)
Received 8 critical monitors from [employer] displaying the following information.  5/1 (4:03 pm)- Sinus tachycardia, simus rhythm with run of V-Tach (9 beats) HR 68 then 205  5/1 (4:10 pm)- A fib RVR with a run of Vtach (6-8 beats) HR 226-241  5/1 (5:07 pm) -A  Fib RVR HR 180  5/1 (5:20 pm)- Afib RVR with run of Vtach ( 4 beats) HR 261  5/1 (5:48 pm)- V tach (3-5 beats), A flutter HR 255  5/1 (5:54 pm)- V-tach (3 beats), A flutter HR 260  5/1 (7:49pm)- Afib with a run of V tach ( 6 beats) HR 221  5/1 (7:56 pm) A fib with EVE with a run of V tach (11 beats) HR 294  Nurse initially attempted to contact pt with no response, then called [employer] who report pt is currently in Afib with HR 166.  Nurse attempted to call again and was able to reach pt. She report during episodes she was watching an intense movie and could feel her heart racing. She also report profusely sweating for 2 hours. Pt state she then took a shower and felt much better. Pt state today she feels really week and doesn't feel like her heart is racing. However, she report she didn't start metoprolol succinate 25 mg daily until this morning around 5 am. Yesterday but only took tartrate. Pt checked BP and report 124/105, HR 68 but doesn't believe it's accurate.  Dr. [place] (DOD) made aware and reviewed strips. MD recommended to increase Metoprolol to 50 mg daily and schedule a sooner appointment.  Pt made aware and verbalized understanding. Appointment scheduled for 5/5 at 12:15 with Azalee Course, PA

## 2020-12-31 NOTE — Discharge Summary (Signed)
Patient ID: [name] MRN: [id number] DOB/AGE: 1961/08/17 60 y.o.  Admit date: 12/18/2020 Discharge date: 12/31/2020  Admission Diagnoses:  Principal Problem:   S/P total knee arthroplasty, left Active Problems:   Benign essential hypertension   Pure hypercholesterolemia   Chronic pain syndrome   Chronic low back pain (Primary Area of Pain) (Bilateral) (R>L)   Discharge Diagnoses:  Same  Past Medical History:  Diagnosis Date  . Anxiety    off of valium- stress  . Arthritis    left knee- rhumatoid- hasnt seen specialist   . Chronic lower back pain   . Complication of anesthesia    WOKE DURING BACK SURGERY ; urinary retention 12/20/2017  . Hyperlipidemia   . Hypertension   . Migraine    "when seasons change" (12/20/2017)  . Neuromuscular disorder (HCC)   . Restless leg syndrome   . Seasonal allergies   . Skin abnormalities   . Wears glasses     Surgeries: Procedure(s): TOTAL KNEE REVISION/ Total Knee Polynethylene Barring, Attune Total Knee on 12/19/2020   Consultants: Treatment Team:  Kendra Birchwood, MD [facility], [place], MD  Discharged Condition: Improved  Hospital Course: Kendra Nelson is an 60 y.o. female who was admitted 12/18/2020 for operative treatment ofS/P total knee arthroplasty, left. Patient has severe unremitting pain that affects sleep, daily activities, and work/hobbies. After pre-op clearance the patient was taken to the operating room on 12/19/2020 and underwent  Procedure(s): TOTAL KNEE REVISION/ Total Knee Polynethylene Barring, Attune Total Knee.    Patient was given perioperative antibiotics:  Anti-infectives (From admission, onward)   Start     Dose/Rate Route Frequency Ordered Stop   12/19/20 1030  ceFAZolin (ANCEF) IVPB 2g/100 mL premix        2 g 200 mL/hr over 30 Minutes Intravenous On call to O.R. 12/19/20 0904 12/19/20 1256       Patient was given sequential compression devices, early ambulation, and chemoprophylaxis to  prevent DVT.  Patient benefited maximally from hospital stay and there were no complications.    Recent vital signs: No data found.   Recent laboratory studies: No results for input(s): WBC, HGB, HCT, PLT, NA, K, CL, CO2, BUN, CREATININE, GLUCOSE, INR, CALCIUM in the last 72 hours.  Invalid input(s): PT, 2   Discharge Medications:   Allergies as of 12/20/2020      Reactions   Benadryl [diphenhydramine Hcl] Hives, Rash   Bee Venom Other (See Comments)   UNSPECIFIED REACTION   Dexamethasone Other (See Comments)   Systemic burning sensation with IV formulation.      Medication List    TAKE these medications   AMBULATORY NON FORMULARY MEDICATION Medication Name:  Intrathecal pump Fentanyl 1,000.0 mcg/ml Bupivacaine 20.0 mg/ml 40 ml pump Rate 357.2 mcg/day   atorvastatin 20 MG tablet Commonly known as: LIPITOR Take 20 mg by mouth every morning.   lisinopril 20 MG tablet Commonly known as: ZESTRIL Take 2 tablets (40 mg total) by mouth every morning.   metoprolol tartrate 25 MG tablet Commonly known as: LOPRESSOR Take 1 tablet (25 mg total) by mouth every 8 (eight) hours as needed (for palpitations).   morphine 30 MG 12 hr tablet Commonly known as: MS CONTIN Take 1 tablet (30 mg total) by mouth every 12 (twelve) hours. Must last 30 days. Do not break tablet   morphine 30 MG 12 hr tablet Commonly known as: MS CONTIN Take 1 tablet (30 mg total) by mouth every 12 (twelve) hours. Must last 30 days.  Do not break tablet   morphine 30 MG 12 hr tablet Commonly known as: MS CONTIN Take 1 tablet (30 mg total) by mouth every 12 (twelve) hours. Must last 30 days. Do not break tablet Start taking on: Jan 18, 2021   rOPINIRole 0.5 MG tablet Commonly known as: REQUIP Take 1 mg by mouth at bedtime. Restless legs   tiZANidine 2 MG tablet Commonly known as: ZANAFLEX Take 1 tablet (2 mg total) by mouth every 6 (six) hours as needed.            Discharge Care Instructions   (From admission, onward)         Start     Ordered   12/19/20 0000  Weight bearing as tolerated        12/19/20 1347          Diagnostic Studies: CT ANGIO LOW EXTREM LEFT W &/OR WO CONTRAST  Result Date: 12/19/2020 CLINICAL DATA:  Recent dislocation of the knee prosthesis with pain, initial encounter EXAM: CT ANGIOGRAPHY OF THE LEFT LOWEREXTREMITY TECHNIQUE: Multidetector CT imaging of the left lowerwas performed using the standard protocol during bolus administration of intravenous contrast. Multiplanar CT image reconstructions and MIPs were obtained to evaluate the vascular anatomy. CONTRAST:  OMNIPAQUE IOHEXOL 350 MG/ML SOLN COMPARISON:  Plain film from earlier in the same day. FINDINGS: Vascular: Distal abdominal aorta and iliac vessels are well visualized bilaterally. Right lower extremity: Right lower extremity is included in this imaging due to a wide field of view. The common femoral, superficial femoral and popliteal artery appear within normal limits. The popliteal trifurcation is patent with three-vessel runoff to the level of the right ankle. Left lower extremity: Common femoral and superficial femoral artery are widely patent. Popliteal artery is well visualized within normal popliteal trifurcation. A portion of the mid popliteal artery is not visualized for a few images due to scatter artifact from the knee prosthesis. Despite this the distal aspect of the popliteal artery demonstrates a normal caliber similar to that seen in the proximal popliteal artery. Three-vessel runoff is noted to the level of the left ankle. No focal area of extravasation is noted. Nonvascular: Posterior displacement of the tibial component with respect to the femoral component is noted consistent with that seen on recent plain film examination. No other acute bony abnormality is noted. Postsurgical changes in the lumbar spine are noted. Visualized visceral structures are unremarkable. Stimulator packs are  noted over the anterior lower pelvic wall. Bladder is within normal limits. No muscular abnormality is seen. No focal hematoma is noted. Of the MIP images confirms the above findings. IMPRESSION: Changes consistent with posterior subluxation/dislocation of the tibial component with respect to the femoral component of the knee prosthesis on the left. Three-vessel runoff bilaterally without evidence of definitive arterial injury. No soft tissue hematoma is noted. Electronically Signed   By: Alcide Clever M.D.   On: 12/19/2020 01:58   DG Knee Complete 4 Views Left  Result Date: 12/18/2020 CLINICAL DATA:  Painful knee with deformity EXAM: LEFT KNEE - COMPLETE 4+ VIEW COMPARISON:  04/12/2010 FINDINGS: Status post left knee replacement. There is posterior dislocation of the tibial component with respect to the femoral component on lateral view. No significant knee effusion. No fracture is seen. IMPRESSION: Status post left knee replacement with posterior dislocation of the tibial component with respect to the femoral component. Electronically Signed   By: Jasmine Pang M.D.   On: 12/18/2020 22:54   ECHOCARDIOGRAM COMPLETE  Result  Date: 12/20/2020    ECHOCARDIOGRAM REPORT   Patient Name:   Kendra Nelson Date of Exam: 12/20/2020 Medical Rec #:  [id number]          Height:       63.0 in Accession #:    [id number]         Weight:       168.0 lb Date of Birth:  19-Feb-1961           BSA:          1.796 m Patient Age:    59 years           BP:           137/64 mmHg Patient Gender: F                  HR:           54 bpm. Exam Location:  Inpatient Procedure: 2D Echo, Cardiac Doppler and Color Doppler                                MODIFIED REPORT:   This report was modified by Arvilla Meres MD on 12/20/2020 due to Update                                     report.  Indications:     R94.31 Abnormal EKG  History:         Patient has no prior history of Echocardiogram examinations.                  Risk  Factors:Hypertension and Dyslipidemia. Neuromuscular                  Disorder.  Sonographer:     Elmarie Shiley Dance Referring Phys:  [id number] Marialuisa Basara K Tasheem Elms Diagnosing Phys: Arvilla Meres MD IMPRESSIONS  1. Left ventricular ejection fraction, by estimation, is 60 to 65%. The left ventricle has normal function. The left ventricle has no regional wall motion abnormalities. Left ventricular diastolic parameters are consistent with Grade II diastolic dysfunction (pseudonormalization).  2. Right ventricular systolic function is normal. The right ventricular size is normal.  3. Left atrial size was moderately dilated.  4. The mitral valve is normal in structure. Trivial mitral valve regurgitation. No evidence of mitral stenosis.  5. The aortic valve is normal in structure. Aortic valve regurgitation is not visualized. No aortic stenosis is present.  6. The inferior vena cava is dilated in size with >50% respiratory variability, suggesting right atrial pressure of 8 mmHg. FINDINGS  Left Ventricle: Left ventricular ejection fraction, by estimation, is 60 to 65%. The left ventricle has normal function. The left ventricle has no regional wall motion abnormalities. The left ventricular internal cavity size was normal in size. There is  no left ventricular hypertrophy. Left ventricular diastolic parameters are consistent with Grade II diastolic dysfunction (pseudonormalization). Right Ventricle: The right ventricular size is normal. No increase in right ventricular wall thickness. Right ventricular systolic function is normal. Left Atrium: Left atrial size was moderately dilated. Right Atrium: Right atrial size was normal in size. Pericardium: There is no evidence of pericardial effusion. Mitral Valve: The mitral valve is normal in structure. There is mild thickening of the mitral valve leaflet(s). Trivial mitral valve regurgitation. No evidence of mitral valve stenosis. Tricuspid Valve: The tricuspid valve is  normal in  structure. Tricuspid valve regurgitation is trivial. No evidence of tricuspid stenosis. Aortic Valve: The aortic valve is normal in structure. Aortic valve regurgitation is not visualized. No aortic stenosis is present. Pulmonic Valve: The pulmonic valve was normal in structure. Pulmonic valve regurgitation is trivial. No evidence of pulmonic stenosis. Aorta: The aortic root is normal in size and structure. Venous: The inferior vena cava is dilated in size with greater than 50% respiratory variability, suggesting right atrial pressure of 8 mmHg. IAS/Shunts: No atrial level shunt detected by color flow Doppler.  LEFT VENTRICLE PLAX 2D LVIDd:         4.60 cm  Diastology LVIDs:         2.80 cm  LV e' medial:    9.46 cm/s LV PW:         1.20 cm  LV E/e' medial:  10.7 LV IVS:        0.90 cm  LV e' lateral:   8.70 cm/s LVOT diam:     2.10 cm  LV E/e' lateral: 11.6 LV SV:         69 LV SV Index:   39 LVOT Area:     3.46 cm  RIGHT VENTRICLE             IVC RV Basal diam:  2.50 cm     IVC diam: 2.10 cm RV S prime:     15.30 cm/s TAPSE (M-mode): 2.8 cm LEFT ATRIUM             Index       RIGHT ATRIUM           Index LA diam:        4.70 cm 2.62 cm/m  RA Area:     16.70 cm LA Vol (A2C):   91.3 ml 50.85 ml/m RA Volume:   45.40 ml  25.28 ml/m LA Vol (A4C):   80.1 ml 44.61 ml/m LA Biplane Vol: 84.7 ml 47.17 ml/m  AORTIC VALVE LVOT Vmax:   93.80 cm/s LVOT Vmean:  63.400 cm/s LVOT VTI:    0.200 m  AORTA Ao Root diam: 3.10 cm Ao Asc diam:  2.70 cm MITRAL VALVE MV Area (PHT): 3.42 cm     SHUNTS MV Decel Time: 222 msec     Systemic VTI:  0.20 m MV E velocity: 101.00 cm/s  Systemic Diam: 2.10 cm MV A velocity: 60.90 cm/s MV E/A ratio:  1.66 Arvilla Meres MD Electronically signed by Arvilla Meres MD Signature Date/Time: 12/20/2020/3:46:11 PM    Final (Updated)     Disposition: Discharge disposition: 01-Home or Self Care       Discharge Instructions    Call MD / Call 911   Complete by: As directed    If you  experience chest pain or shortness of breath, CALL 911 and be transported to the hospital emergency room.  If you develope a fever above 101 F, pus (white drainage) or increased drainage or redness at the wound, or calf pain, call your surgeon's office.   Constipation Prevention   Complete by: As directed    Drink plenty of fluids.  Prune juice may be helpful.  You may use a stool softener, such as Colace (over the counter) 100 mg twice a day.  Use MiraLax (over the counter) for constipation as needed.   Diet - low sodium heart healthy   Complete by: As directed    Driving restrictions   Complete by: As directed  No driving for 2 weeks   Increase activity slowly as tolerated   Complete by: As directed    Patient may shower   Complete by: As directed    You may shower without a dressing once there is no drainage.  Do not wash over the wound.  If drainage remains, cover wound with plastic wrap and then shower.   Post-operative opioid taper instructions:   Complete by: As directed    POST-OPERATIVE OPIOID TAPER INSTRUCTIONS: It is important to wean off of your opioid medication as soon as possible. If you do not need pain medication after your surgery it is ok to stop day one. Opioids include: Codeine, Hydrocodone(Norco, Vicodin), Oxycodone(Percocet, oxycontin) and hydromorphone amongst others.  Long term and even short term use of opiods can cause: Increased pain response Dependence Constipation Depression Respiratory depression And more.  Withdrawal symptoms can include Flu like symptoms Nausea, vomiting And more Techniques to manage these symptoms Hydrate well Eat regular healthy meals Stay active Use relaxation techniques(deep breathing, meditating, yoga) Do Not substitute Alcohol to help with tapering If you have been on opioids for less than two weeks and do not have pain than it is ok to stop all together.  Plan to wean off of opioids This plan should start within one  week post op of your joint replacement. Maintain the same interval or time between taking each dose and first decrease the dose.  Cut the total daily intake of opioids by one tablet each day Next start to increase the time between doses. The last dose that should be eliminated is the evening dose.      Weight bearing as tolerated   Complete by: As directed        Follow-up Information    Kendra Birchwood, MD In 2 weeks.   Specialty: Orthopedic Surgery Contact information: [address] [place] [address] [phone]        [facility] Follow up.   Specialty: Home Health Services Why: [facility] physical therapy Contact information: [address] [place] [address] [phone]        Parke Poisson, MD Follow up.   Specialties: Cardiology, Radiology Why: Office will call regarding monitor and follow-up visit. Contact information: [address] [place] [address] [phone]                Signed: Dannielle Burn 12/31/2020, 1:13 PM

## 2021-01-01 DIAGNOSIS — M5417 Radiculopathy, lumbosacral region: Secondary | ICD-10-CM | POA: Diagnosis not present

## 2021-01-01 DIAGNOSIS — I10 Essential (primary) hypertension: Secondary | ICD-10-CM | POA: Diagnosis not present

## 2021-01-01 DIAGNOSIS — L409 Psoriasis, unspecified: Secondary | ICD-10-CM | POA: Diagnosis not present

## 2021-01-01 DIAGNOSIS — T84023D Instability of internal left knee prosthesis, subsequent encounter: Secondary | ICD-10-CM | POA: Diagnosis not present

## 2021-01-01 DIAGNOSIS — Z87891 Personal history of nicotine dependence: Secondary | ICD-10-CM | POA: Diagnosis not present

## 2021-01-01 DIAGNOSIS — G2581 Restless legs syndrome: Secondary | ICD-10-CM | POA: Diagnosis not present

## 2021-01-01 DIAGNOSIS — R69 Illness, unspecified: Secondary | ICD-10-CM | POA: Diagnosis not present

## 2021-01-01 DIAGNOSIS — E7800 Pure hypercholesterolemia, unspecified: Secondary | ICD-10-CM | POA: Diagnosis not present

## 2021-01-01 DIAGNOSIS — G43909 Migraine, unspecified, not intractable, without status migrainosus: Secondary | ICD-10-CM | POA: Diagnosis not present

## 2021-01-01 DIAGNOSIS — M069 Rheumatoid arthritis, unspecified: Secondary | ICD-10-CM | POA: Diagnosis not present

## 2021-01-01 DIAGNOSIS — M5134 Other intervertebral disc degeneration, thoracic region: Secondary | ICD-10-CM | POA: Diagnosis not present

## 2021-01-01 DIAGNOSIS — G894 Chronic pain syndrome: Secondary | ICD-10-CM | POA: Diagnosis not present

## 2021-01-01 DIAGNOSIS — Z7901 Long term (current) use of anticoagulants: Secondary | ICD-10-CM | POA: Diagnosis not present

## 2021-01-01 DIAGNOSIS — M5136 Other intervertebral disc degeneration, lumbar region: Secondary | ICD-10-CM | POA: Diagnosis not present

## 2021-01-02 ENCOUNTER — Ambulatory Visit: Payer: Medicare HMO | Admitting: Physician Assistant

## 2021-01-02 ENCOUNTER — Telehealth: Payer: Self-pay

## 2021-01-02 NOTE — Telephone Encounter (Signed)
Spoke with pt regarding cancelled appointment today to see Azalee Course, PA-C. Pt states that she is having GI upset with diarrhea and can not possibly leave the house today. Pt states that she really tried to come and attempted to get ready to come and had to stop because of stomach upset and having to remain close to the toilet. Pt states that she does not normally cancel appointments. Pt made aware of importance of today's visit. Pt willing to remake appointment.  Appointment with Azalee Course, PA-C remade for 01/08/21 at 3:15pm. Pt instructed of ED precautions. Pt verbalizes understanding and will keep her appointment.

## 2021-01-03 DIAGNOSIS — T84023D Instability of internal left knee prosthesis, subsequent encounter: Secondary | ICD-10-CM | POA: Diagnosis not present

## 2021-01-03 DIAGNOSIS — R69 Illness, unspecified: Secondary | ICD-10-CM | POA: Diagnosis not present

## 2021-01-03 DIAGNOSIS — Z87891 Personal history of nicotine dependence: Secondary | ICD-10-CM | POA: Diagnosis not present

## 2021-01-03 DIAGNOSIS — M5417 Radiculopathy, lumbosacral region: Secondary | ICD-10-CM | POA: Diagnosis not present

## 2021-01-03 DIAGNOSIS — M069 Rheumatoid arthritis, unspecified: Secondary | ICD-10-CM | POA: Diagnosis not present

## 2021-01-03 DIAGNOSIS — L409 Psoriasis, unspecified: Secondary | ICD-10-CM | POA: Diagnosis not present

## 2021-01-03 DIAGNOSIS — G2581 Restless legs syndrome: Secondary | ICD-10-CM | POA: Diagnosis not present

## 2021-01-03 DIAGNOSIS — I10 Essential (primary) hypertension: Secondary | ICD-10-CM | POA: Diagnosis not present

## 2021-01-03 DIAGNOSIS — G894 Chronic pain syndrome: Secondary | ICD-10-CM | POA: Diagnosis not present

## 2021-01-03 DIAGNOSIS — G43909 Migraine, unspecified, not intractable, without status migrainosus: Secondary | ICD-10-CM | POA: Diagnosis not present

## 2021-01-03 DIAGNOSIS — Z7901 Long term (current) use of anticoagulants: Secondary | ICD-10-CM | POA: Diagnosis not present

## 2021-01-03 DIAGNOSIS — M5136 Other intervertebral disc degeneration, lumbar region: Secondary | ICD-10-CM | POA: Diagnosis not present

## 2021-01-03 DIAGNOSIS — M5134 Other intervertebral disc degeneration, thoracic region: Secondary | ICD-10-CM | POA: Diagnosis not present

## 2021-01-03 DIAGNOSIS — E7800 Pure hypercholesterolemia, unspecified: Secondary | ICD-10-CM | POA: Diagnosis not present

## 2021-01-06 ENCOUNTER — Telehealth: Payer: Self-pay

## 2021-01-06 DIAGNOSIS — G894 Chronic pain syndrome: Secondary | ICD-10-CM | POA: Diagnosis not present

## 2021-01-06 DIAGNOSIS — M5134 Other intervertebral disc degeneration, thoracic region: Secondary | ICD-10-CM | POA: Diagnosis not present

## 2021-01-06 DIAGNOSIS — M069 Rheumatoid arthritis, unspecified: Secondary | ICD-10-CM | POA: Diagnosis not present

## 2021-01-06 DIAGNOSIS — M5417 Radiculopathy, lumbosacral region: Secondary | ICD-10-CM | POA: Diagnosis not present

## 2021-01-06 DIAGNOSIS — T84023D Instability of internal left knee prosthesis, subsequent encounter: Secondary | ICD-10-CM | POA: Diagnosis not present

## 2021-01-06 DIAGNOSIS — G43909 Migraine, unspecified, not intractable, without status migrainosus: Secondary | ICD-10-CM | POA: Diagnosis not present

## 2021-01-06 DIAGNOSIS — G2581 Restless legs syndrome: Secondary | ICD-10-CM | POA: Diagnosis not present

## 2021-01-06 DIAGNOSIS — Z87891 Personal history of nicotine dependence: Secondary | ICD-10-CM | POA: Diagnosis not present

## 2021-01-06 DIAGNOSIS — L409 Psoriasis, unspecified: Secondary | ICD-10-CM | POA: Diagnosis not present

## 2021-01-06 DIAGNOSIS — R69 Illness, unspecified: Secondary | ICD-10-CM | POA: Diagnosis not present

## 2021-01-06 DIAGNOSIS — Z7901 Long term (current) use of anticoagulants: Secondary | ICD-10-CM | POA: Diagnosis not present

## 2021-01-06 DIAGNOSIS — E7800 Pure hypercholesterolemia, unspecified: Secondary | ICD-10-CM | POA: Diagnosis not present

## 2021-01-06 DIAGNOSIS — I10 Essential (primary) hypertension: Secondary | ICD-10-CM | POA: Diagnosis not present

## 2021-01-06 DIAGNOSIS — M5136 Other intervertebral disc degeneration, lumbar region: Secondary | ICD-10-CM | POA: Diagnosis not present

## 2021-01-06 NOTE — Telephone Encounter (Signed)
Left message for pt to call back.  [employer] Critical Event: noted 01/04/21 at 12:45am. Pt had 8 beat run of V-tach followed by bradycardia. Called to check on pt. Left message for pt to call back. Pt is scheduled for an office visit with Azalee Course, PA-C 01/07/21 at 3:15pm.

## 2021-01-06 NOTE — Telephone Encounter (Signed)
Patient has appointment to see Azalee Course PA-C on 01/07/21.

## 2021-01-07 ENCOUNTER — Encounter: Payer: Self-pay | Admitting: Physician Assistant

## 2021-01-07 ENCOUNTER — Ambulatory Visit (INDEPENDENT_AMBULATORY_CARE_PROVIDER_SITE_OTHER): Payer: Medicare HMO | Admitting: Physician Assistant

## 2021-01-07 ENCOUNTER — Telehealth: Payer: Self-pay

## 2021-01-07 ENCOUNTER — Other Ambulatory Visit: Payer: Self-pay

## 2021-01-07 VITALS — BP 188/87 | HR 49 | Ht 63.0 in | Wt 179.0 lb

## 2021-01-07 DIAGNOSIS — I10 Essential (primary) hypertension: Secondary | ICD-10-CM

## 2021-01-07 DIAGNOSIS — I480 Paroxysmal atrial fibrillation: Secondary | ICD-10-CM | POA: Diagnosis not present

## 2021-01-07 DIAGNOSIS — I4729 Other ventricular tachycardia: Secondary | ICD-10-CM

## 2021-01-07 DIAGNOSIS — E785 Hyperlipidemia, unspecified: Secondary | ICD-10-CM | POA: Diagnosis not present

## 2021-01-07 DIAGNOSIS — R0602 Shortness of breath: Secondary | ICD-10-CM | POA: Diagnosis not present

## 2021-01-07 DIAGNOSIS — I472 Ventricular tachycardia: Secondary | ICD-10-CM | POA: Diagnosis not present

## 2021-01-07 NOTE — Progress Notes (Signed)
Cardiology Office Note:    Date:  01/09/2021   ID:  Kendra Nelson, DOB 03/04/1961, MRN [id number]  PCP:  Iona Hansen, NP   [facility] Providers Cardiologist:  Parke Poisson, MD {   Referring MD: Iona Hansen, NP   Chief Complaint  Patient presents with  . Follow-up    Seen for Dr. Jacques Navy    History of Present Illness:    Kendra Nelson is a 60 y.o. female with a hx of hypertension, hyperlipidemia, chronic back pain s/p spinal cord stimulator, palpitation and tobacco abuse.  Patient was planning to undergo total knee surgery in April 2022 however postop on arrival to PACU, she had narrow complex tachycardia with heart rate greater than 200 and runs of V. tach.  She was asymptomatic and was given 50 mg of esmolol and IV metoprolol.  EKG confirmed atrial fibrillation with RVR.  She was seen by Dr. Doreene Nelson on 12/19/2020 for consultation.  EKG was felt to be atrial flutter with 2 1 conduction.  She was not sure if the wide-complex tachycardia tachycardia could be atrial fibrillation with aberrancy versus nonsustained VT.  Patient was started on amiodarone bolus and infusion.  She was converted to sinus rhythm on amiodarone overnight.  The longest run of nonsustained VT was 30 beats.  She was discharged on 25 mg metoprolol tartrate as needed for palpitation.  Amiodarone was stopped prior to discharge.  She was placed on a 30-day event monitor.  Patient was discharged on 2.5 mg twice a day of Eliquis and was cleared by orthopedic surgery to switch to full dose after 2 weeks.  Patient presents today for follow-up today along with her husband.  Recent heart monitor shows frequent PAF and nonsustained VT.  She has been switched to 5 mg twice a day of Eliquis.  Toprol-XL has been increased to 50 mg daily to allow better suppression of irregular heartbeat.  Heart rate today is borderline low.  Blood pressure is elevated.  I recommend continue to wear the heart monitor and hopefully in  the second half of the heart monitor report, we will see that her irregular heartbeat has decreased after beta-blocker was increased.  I discussed her case with DOD Dr. Jacques Navy, we recommend proceed with nuclear stress test given the frequency of nonsustained VT.  We will also refer her to EP service at the recommendation of Dr. Jacques Navy.   Past Medical History:  Diagnosis Date  . Anxiety    off of valium- stress  . Arthritis    left knee- rhumatoid- hasnt seen specialist   . Chronic lower back pain   . Complication of anesthesia    WOKE DURING BACK SURGERY ; urinary retention 12/20/2017  . Hyperlipidemia   . Hypertension   . Migraine    "when seasons change" (12/20/2017)  . Neuromuscular disorder (HCC)   . Restless leg syndrome   . Seasonal allergies   . Skin abnormalities   . Wears glasses     Past Surgical History:  Procedure Laterality Date  . BACK SURGERY     8-9 SURGERIES; lower back  . COLONOSCOPY    . INTRATHECAL PUMP REVISION N/A 05/09/2019   Procedure: Intrathecal pump change;  Surgeon: Odette Fraction, MD;  Location: [facility] OR;  Service: Neurosurgery;  Laterality: N/A;  Intrathecal pump change  . JOINT REPLACEMENT    . PAIN PUMP IMPLANTATION  05/10/2012   Procedure: PAIN PUMP INSERTION;  Surgeon: Maeola Harman, MD;  Location: [facility] NEURO  ORS;  Service: Neurosurgery;  Laterality: N/A;  Pump replacement  . SPINAL CORD STIMULATOR BATTERY EXCHANGE N/A 06/09/2016   Procedure: IMPLANTABLE PULSE GENERATOR REPLACEMENT WITH RECHARGEABLE BATTERY;  Surgeon: Maeola Harman, MD;  Location: [facility] OR;  Service: Neurosurgery;  Laterality: N/A;  . TOTAL KNEE ARTHROPLASTY Left 12/20/2017  . TOTAL KNEE ARTHROPLASTY Left 12/20/2017   Procedure: TOTAL KNEE ARTHROPLASTY;  Surgeon: Gean Birchwood, MD;  Location: [facility] OR;  Service: Orthopedics;  Laterality: Left;  . TOTAL KNEE REVISION Left 12/19/2020   Procedure: TOTAL KNEE REVISION/ Total Knee Polynethylene Barring, Attune Total Knee;  Surgeon: Gean Birchwood, MD;   Location: [facility] ORS;  Service: Orthopedics;  Laterality: Left;  Marland Kitchen VAGINAL HYSTERECTOMY      Current Medications: Current Meds  Medication Sig  . apixaban (ELIQUIS) 5 MG TABS tablet Take 1 tablet (5 mg total) by mouth 2 (two) times daily.  Marland Kitchen atorvastatin (LIPITOR) 20 MG tablet Take 20 mg by mouth every morning.   Marland Kitchen lisinopril (ZESTRIL) 20 MG tablet Take 2 tablets (40 mg total) by mouth every morning.  . metoprolol succinate (TOPROL-XL) 25 MG 24 hr tablet Take 2 tablets (50 mg total) by mouth daily.  . metoprolol tartrate (LOPRESSOR) 25 MG tablet Take 1 tablet (25 mg total) by mouth every 8 (eight) hours as needed (for palpitations).  . morphine (MS CONTIN) 30 MG 12 hr tablet Take 1 tablet (30 mg total) by mouth every 12 (twelve) hours. Must last 30 days. Do not break tablet  . rOPINIRole (REQUIP) 0.5 MG tablet Take 1 mg by mouth at bedtime. Restless legs  . tiZANidine (ZANAFLEX) 2 MG tablet Take 1 tablet (2 mg total) by mouth every 6 (six) hours as needed.     Allergies:   Benadryl [diphenhydramine hcl], Bee venom, and Dexamethasone   Social History   Socioeconomic History  . Marital status: Married    Spouse name: Not on file  . Number of children: Not on file  . Years of education: Not on file  . Highest education level: Not on file  Occupational History  . Not on file  Tobacco Use  . Smoking status: Former Smoker    Packs/day: 1.00    Years: 30.00    Pack years: 30.00    Types: Cigarettes    Quit date: 11/12/2018    Years since quitting: 2.1  . Smokeless tobacco: Never Used  Vaping Use  . Vaping Use: Former  Substance and Sexual Activity  . Alcohol use: No  . Drug use: Yes    Types: Oxycodone  . Sexual activity: Not Currently  Other Topics Concern  . Not on file  Social History Narrative  . Not on file   Social Determinants of Health   Financial Resource Strain: Not on file  Food Insecurity: Not on file  Transportation Needs: Not on file  Physical Activity: Not on  file  Stress: Not on file  Social Connections: Not on file     Family History: The patient's family history includes Aortic aneurysm in her mother; Diabetes in her maternal grandmother.  ROS:   Please see the history of present illness.     All other systems reviewed and are negative.  EKGs/Labs/Other Studies Reviewed:    The following studies were reviewed today:  Echo 12/20/2020 1. Left ventricular ejection fraction, by estimation, is 60 to 65%. The  left ventricle has normal function. The left ventricle has no regional  wall motion abnormalities. Left ventricular diastolic parameters are  consistent with  Grade II diastolic  dysfunction (pseudonormalization).  2. Right ventricular systolic function is normal. The right ventricular  size is normal.  3. Left atrial size was moderately dilated.  4. The mitral valve is normal in structure. Trivial mitral valve  regurgitation. No evidence of mitral stenosis.  5. The aortic valve is normal in structure. Aortic valve regurgitation is  not visualized. No aortic stenosis is present.  6. The inferior vena cava is dilated in size with >50% respiratory  variability, suggesting right atrial pressure of 8 mmHg.   EKG:  EKG is ordered today.  The ekg ordered today demonstrates sinus bradycardia, heart rate 49 bpm.  No significant ST-T wave changes.  Recent Labs: 12/19/2020: ALT 26; TSH 0.705 12/20/2020: BUN 16; Creatinine, Ser 0.69; Hemoglobin 14.6; Magnesium 2.1; Platelets 210; Potassium 3.7; Sodium 139  Recent Lipid Panel No results found for: CHOL, TRIG, HDL, CHOLHDL, VLDL, LDLCALC, LDLDIRECT   Risk Assessment/Calculations:    CHA2DS2-VASc Score = 2  This indicates a 2.2% annual risk of stroke. The patient's score is based upon: CHF History: No HTN History: Yes Diabetes History: No Stroke History: No Vascular Disease History: No Age Score: 0 Gender Score: 1       Physical Exam:    VS:  BP (!) 188/87   Pulse (!) 49    Ht 5\' 3"  (1.6 m)   Wt 179 lb (81.2 kg)   SpO2 98%   BMI 31.71 kg/m     Wt Readings from Last 3 Encounters:  01/07/21 179 lb (81.2 kg)  12/19/20 168 lb (76.2 kg)  11/12/20 170 lb (77.1 kg)     GEN:  Well nourished, well developed in no acute distress HEENT: Normal NECK: No JVD; No carotid bruits LYMPHATICS: No lymphadenopathy CARDIAC: RRR, no murmurs, rubs, gallops RESPIRATORY:  Clear to auscultation without rales, wheezing or rhonchi  ABDOMEN: Soft, non-tender, non-distended MUSCULOSKELETAL:  No edema; No deformity  SKIN: Warm and dry NEUROLOGIC:  Alert and oriented x 3 PSYCHIATRIC:  Normal affect   ASSESSMENT:    1. PAF (paroxysmal atrial fibrillation) (HCC)   2. SOB (shortness of breath)   3. NSVT (nonsustained ventricular tachycardia) (HCC)   4. Primary hypertension   5. Hyperlipidemia LDL goal <100    PLAN:    In order of problems listed above:  1. PAF: She was initially noted to have paroxysmal atrial fibrillation after surgery.  Since discharge, she continued to have frequent bursts of atrial fibrillation on heart monitor.  She was temporarily on amiodarone during the hospitalization after surgery, this has weaned off prior to discharge.  I am hesitant to start her on amiodarone given her young age.  Toprol-XL has increased to 50 mg daily to allow better suppression of irregular heartbeat.  She is currently in sinus bradycardia with heart rate of 49 bpm.  We will continue on current therapy  2. Shortness of breath: Likely related to recent surgery and PAF.  I discussed her case with DOD Dr. Jacques Navy, given the frequent PAF and nonsustained VT, we will refer her to electrophysiology service.  Prior to that, she will undergo Myoview to make sure there is no underlying ischemia to explain her nonsustained VT  3. Nonsustained VT: Frequent nonsustained VT seen on recent heart monitor report.  Plan to undergo Myoview to rule out underlying ischemia and referred to EP  service  4. Hypertension: Blood pressure is elevated today, however I do not have any previous blood pressure to compare to.  Plan to  reassess on follow-up  5. Hyperlipidemia: On Lipitor.   Shared Decision Making/Informed Consent The risks [chest pain, shortness of breath, cardiac arrhythmias, dizziness, blood pressure fluctuations, myocardial infarction, stroke/transient ischemic attack, nausea, vomiting, allergic reaction, radiation exposure, metallic taste sensation and life-threatening complications (estimated to be 1 in 10,000)], benefits (risk stratification, diagnosing coronary artery disease, treatment guidance) and alternatives of a nuclear stress test were discussed in detail with Kendra Nelson and she agrees to proceed.       Medication Adjustments/Labs and Tests Ordered: Current medicines are reviewed at length with the patient today.  Concerns regarding medicines are outlined above.  Orders Placed This Encounter  Procedures  . Ambulatory referral to Cardiac Electrophysiology  . MYOCARDIAL PERFUSION IMAGING  . EKG 12-Lead   No orders of the defined types were placed in this encounter.   Patient Instructions  Medication Instructions:  No Changes *If you need a refill on your cardiac medications before your next appointment, please call your pharmacy*   Lab Work: No Labs  If you have labs (blood work) drawn today and your tests are completely normal, you will receive your results only by: Marland Kitchen MyChart Message (if you have MyChart) OR . A paper copy in the mail If you have any lab test that is abnormal or we need to change your treatment, we will call you to review the results.   Testing/Procedures:[address], [address] Your physician has requested that you have a lexiscan myoview. For further information please visit [email or web address] Please follow instruction sheet, as given.    Follow-Up: At [facility], you and your health needs are our  priority.  As part of our continuing mission to provide you with exceptional heart care, we have created designated Provider Care Teams.  These Care Teams include your primary Cardiologist (physician) and Advanced Practice Providers (APPs -  Physician Assistants and Nurse Practitioners) who all work together to provide you with the care you need, when you need it.  Your next appointment:   02/19/2021 3:45   The format for your next appointment:   In Person  Provider:   Azalee Course PA-C        Signed, Azalee Course, [place]  01/09/2021 11:51 PM    [facility]

## 2021-01-07 NOTE — Patient Instructions (Addendum)
Medication Instructions:  No Changes *If you need a refill on your cardiac medications before your next appointment, please call your pharmacy*   Lab Work: No Labs  If you have labs (blood work) drawn today and your tests are completely normal, you will receive your results only by: Marland Kitchen MyChart Message (if you have MyChart) OR . A paper copy in the mail If you have any lab test that is abnormal or we need to change your treatment, we will call you to review the results.   Testing/Procedures:[address], [address] Your physician has requested that you have a lexiscan myoview. For further information please visit [email or web address] Please follow instruction sheet, as given.    Follow-Up: At [facility], you and your health needs are our priority.  As part of our continuing mission to provide you with exceptional heart care, we have created designated Provider Care Teams.  These Care Teams include your primary Cardiologist (physician) and Advanced Practice Providers (APPs -  Physician Assistants and Nurse Practitioners) who all work together to provide you with the care you need, when you need it.  Your next appointment:   02/19/2021 3:45   The format for your next appointment:   In Person  Provider:   Azalee Course PA-C

## 2021-01-08 ENCOUNTER — Telehealth: Payer: Self-pay | Admitting: Physician Assistant

## 2021-01-08 DIAGNOSIS — R69 Illness, unspecified: Secondary | ICD-10-CM | POA: Diagnosis not present

## 2021-01-08 DIAGNOSIS — G894 Chronic pain syndrome: Secondary | ICD-10-CM | POA: Diagnosis not present

## 2021-01-08 DIAGNOSIS — G2581 Restless legs syndrome: Secondary | ICD-10-CM | POA: Diagnosis not present

## 2021-01-08 DIAGNOSIS — M069 Rheumatoid arthritis, unspecified: Secondary | ICD-10-CM | POA: Diagnosis not present

## 2021-01-08 DIAGNOSIS — M5136 Other intervertebral disc degeneration, lumbar region: Secondary | ICD-10-CM | POA: Diagnosis not present

## 2021-01-08 DIAGNOSIS — Z7901 Long term (current) use of anticoagulants: Secondary | ICD-10-CM | POA: Diagnosis not present

## 2021-01-08 DIAGNOSIS — Z87891 Personal history of nicotine dependence: Secondary | ICD-10-CM | POA: Diagnosis not present

## 2021-01-08 DIAGNOSIS — G43909 Migraine, unspecified, not intractable, without status migrainosus: Secondary | ICD-10-CM | POA: Diagnosis not present

## 2021-01-08 DIAGNOSIS — I10 Essential (primary) hypertension: Secondary | ICD-10-CM | POA: Diagnosis not present

## 2021-01-08 DIAGNOSIS — E7800 Pure hypercholesterolemia, unspecified: Secondary | ICD-10-CM | POA: Diagnosis not present

## 2021-01-08 DIAGNOSIS — L409 Psoriasis, unspecified: Secondary | ICD-10-CM | POA: Diagnosis not present

## 2021-01-08 DIAGNOSIS — M5417 Radiculopathy, lumbosacral region: Secondary | ICD-10-CM | POA: Diagnosis not present

## 2021-01-08 DIAGNOSIS — M5134 Other intervertebral disc degeneration, thoracic region: Secondary | ICD-10-CM | POA: Diagnosis not present

## 2021-01-08 DIAGNOSIS — T84023D Instability of internal left knee prosthesis, subsequent encounter: Secondary | ICD-10-CM | POA: Diagnosis not present

## 2021-01-08 NOTE — Telephone Encounter (Signed)
Left message for patient to call and schedule Lexiscan myoview ordered by Azalee Course, PA

## 2021-01-09 ENCOUNTER — Other Ambulatory Visit (HOSPITAL_COMMUNITY): Payer: Self-pay | Admitting: Orthopedic Surgery

## 2021-01-09 ENCOUNTER — Ambulatory Visit (HOSPITAL_COMMUNITY)
Admission: RE | Admit: 2021-01-09 | Discharge: 2021-01-09 | Disposition: A | Discharge status: Home / Self Care | Payer: Medicare HMO | Source: Ambulatory Visit | Attending: Orthopedic Surgery | Admitting: Orthopedic Surgery

## 2021-01-09 ENCOUNTER — Encounter: Payer: Self-pay | Admitting: Physician Assistant

## 2021-01-09 ENCOUNTER — Other Ambulatory Visit: Payer: Self-pay

## 2021-01-09 DIAGNOSIS — M79662 Pain in left lower leg: Secondary | ICD-10-CM

## 2021-01-09 DIAGNOSIS — M7989 Other specified soft tissue disorders: Secondary | ICD-10-CM | POA: Diagnosis not present

## 2021-01-10 DIAGNOSIS — M5136 Other intervertebral disc degeneration, lumbar region: Secondary | ICD-10-CM | POA: Diagnosis not present

## 2021-01-10 DIAGNOSIS — Z7901 Long term (current) use of anticoagulants: Secondary | ICD-10-CM | POA: Diagnosis not present

## 2021-01-10 DIAGNOSIS — M5134 Other intervertebral disc degeneration, thoracic region: Secondary | ICD-10-CM | POA: Diagnosis not present

## 2021-01-10 DIAGNOSIS — G43909 Migraine, unspecified, not intractable, without status migrainosus: Secondary | ICD-10-CM | POA: Diagnosis not present

## 2021-01-10 DIAGNOSIS — G894 Chronic pain syndrome: Secondary | ICD-10-CM | POA: Diagnosis not present

## 2021-01-10 DIAGNOSIS — M069 Rheumatoid arthritis, unspecified: Secondary | ICD-10-CM | POA: Diagnosis not present

## 2021-01-10 DIAGNOSIS — T84023D Instability of internal left knee prosthesis, subsequent encounter: Secondary | ICD-10-CM | POA: Diagnosis not present

## 2021-01-10 DIAGNOSIS — G2581 Restless legs syndrome: Secondary | ICD-10-CM | POA: Diagnosis not present

## 2021-01-10 DIAGNOSIS — E7800 Pure hypercholesterolemia, unspecified: Secondary | ICD-10-CM | POA: Diagnosis not present

## 2021-01-10 DIAGNOSIS — I10 Essential (primary) hypertension: Secondary | ICD-10-CM | POA: Diagnosis not present

## 2021-01-10 DIAGNOSIS — M5417 Radiculopathy, lumbosacral region: Secondary | ICD-10-CM | POA: Diagnosis not present

## 2021-01-10 DIAGNOSIS — Z87891 Personal history of nicotine dependence: Secondary | ICD-10-CM | POA: Diagnosis not present

## 2021-01-10 DIAGNOSIS — R69 Illness, unspecified: Secondary | ICD-10-CM | POA: Diagnosis not present

## 2021-01-10 DIAGNOSIS — L409 Psoriasis, unspecified: Secondary | ICD-10-CM | POA: Diagnosis not present

## 2021-01-13 DIAGNOSIS — E7800 Pure hypercholesterolemia, unspecified: Secondary | ICD-10-CM | POA: Diagnosis not present

## 2021-01-13 DIAGNOSIS — Z7901 Long term (current) use of anticoagulants: Secondary | ICD-10-CM | POA: Diagnosis not present

## 2021-01-13 DIAGNOSIS — M069 Rheumatoid arthritis, unspecified: Secondary | ICD-10-CM | POA: Diagnosis not present

## 2021-01-13 DIAGNOSIS — Z87891 Personal history of nicotine dependence: Secondary | ICD-10-CM | POA: Diagnosis not present

## 2021-01-13 DIAGNOSIS — M5136 Other intervertebral disc degeneration, lumbar region: Secondary | ICD-10-CM | POA: Diagnosis not present

## 2021-01-13 DIAGNOSIS — G2581 Restless legs syndrome: Secondary | ICD-10-CM | POA: Diagnosis not present

## 2021-01-13 DIAGNOSIS — M5417 Radiculopathy, lumbosacral region: Secondary | ICD-10-CM | POA: Diagnosis not present

## 2021-01-13 DIAGNOSIS — M5134 Other intervertebral disc degeneration, thoracic region: Secondary | ICD-10-CM | POA: Diagnosis not present

## 2021-01-13 DIAGNOSIS — I10 Essential (primary) hypertension: Secondary | ICD-10-CM | POA: Diagnosis not present

## 2021-01-13 DIAGNOSIS — T84023D Instability of internal left knee prosthesis, subsequent encounter: Secondary | ICD-10-CM | POA: Diagnosis not present

## 2021-01-13 DIAGNOSIS — R69 Illness, unspecified: Secondary | ICD-10-CM | POA: Diagnosis not present

## 2021-01-13 DIAGNOSIS — G43909 Migraine, unspecified, not intractable, without status migrainosus: Secondary | ICD-10-CM | POA: Diagnosis not present

## 2021-01-13 DIAGNOSIS — G894 Chronic pain syndrome: Secondary | ICD-10-CM | POA: Diagnosis not present

## 2021-01-13 DIAGNOSIS — L409 Psoriasis, unspecified: Secondary | ICD-10-CM | POA: Diagnosis not present

## 2021-01-14 DIAGNOSIS — Z96652 Presence of left artificial knee joint: Secondary | ICD-10-CM | POA: Diagnosis not present

## 2021-01-14 DIAGNOSIS — Z471 Aftercare following joint replacement surgery: Secondary | ICD-10-CM | POA: Diagnosis not present

## 2021-01-15 ENCOUNTER — Telehealth (HOSPITAL_COMMUNITY): Payer: Self-pay | Admitting: *Deleted

## 2021-01-15 DIAGNOSIS — Z7901 Long term (current) use of anticoagulants: Secondary | ICD-10-CM | POA: Diagnosis not present

## 2021-01-15 DIAGNOSIS — M5417 Radiculopathy, lumbosacral region: Secondary | ICD-10-CM | POA: Diagnosis not present

## 2021-01-15 DIAGNOSIS — M5134 Other intervertebral disc degeneration, thoracic region: Secondary | ICD-10-CM | POA: Diagnosis not present

## 2021-01-15 DIAGNOSIS — M069 Rheumatoid arthritis, unspecified: Secondary | ICD-10-CM | POA: Diagnosis not present

## 2021-01-15 DIAGNOSIS — M5136 Other intervertebral disc degeneration, lumbar region: Secondary | ICD-10-CM | POA: Diagnosis not present

## 2021-01-15 DIAGNOSIS — G894 Chronic pain syndrome: Secondary | ICD-10-CM | POA: Diagnosis not present

## 2021-01-15 DIAGNOSIS — L409 Psoriasis, unspecified: Secondary | ICD-10-CM | POA: Diagnosis not present

## 2021-01-15 DIAGNOSIS — E7800 Pure hypercholesterolemia, unspecified: Secondary | ICD-10-CM | POA: Diagnosis not present

## 2021-01-15 DIAGNOSIS — Z87891 Personal history of nicotine dependence: Secondary | ICD-10-CM | POA: Diagnosis not present

## 2021-01-15 DIAGNOSIS — R69 Illness, unspecified: Secondary | ICD-10-CM | POA: Diagnosis not present

## 2021-01-15 DIAGNOSIS — G43909 Migraine, unspecified, not intractable, without status migrainosus: Secondary | ICD-10-CM | POA: Diagnosis not present

## 2021-01-15 DIAGNOSIS — I10 Essential (primary) hypertension: Secondary | ICD-10-CM | POA: Diagnosis not present

## 2021-01-15 DIAGNOSIS — G2581 Restless legs syndrome: Secondary | ICD-10-CM | POA: Diagnosis not present

## 2021-01-15 DIAGNOSIS — T84023D Instability of internal left knee prosthesis, subsequent encounter: Secondary | ICD-10-CM | POA: Diagnosis not present

## 2021-01-15 NOTE — Telephone Encounter (Signed)
Left message on voicemail per DPR in reference to upcoming appointment scheduled on 01/17/21 at 10:15 with detailed instructions given per Myocardial Perfusion Study Information Sheet for the test. LM to arrive 15 minutes early, and that it is imperative to arrive on time for appointment to keep from having the test rescheduled. If you need to cancel or reschedule your appointment, please call the office within 24 hours of your appointment. Failure to do so may result in a cancellation of your appointment, and a $50 no show fee. Phone number given for call back for any questions.

## 2021-01-17 ENCOUNTER — Ambulatory Visit (HOSPITAL_COMMUNITY): Payer: Medicare HMO | Attending: Internal Medicine

## 2021-01-17 ENCOUNTER — Other Ambulatory Visit: Payer: Self-pay

## 2021-01-17 VITALS — Ht 63.0 in | Wt 179.0 lb

## 2021-01-17 DIAGNOSIS — R0602 Shortness of breath: Secondary | ICD-10-CM | POA: Insufficient documentation

## 2021-01-17 DIAGNOSIS — I484 Atypical atrial flutter: Secondary | ICD-10-CM | POA: Diagnosis not present

## 2021-01-17 DIAGNOSIS — I480 Paroxysmal atrial fibrillation: Secondary | ICD-10-CM | POA: Diagnosis not present

## 2021-01-17 LAB — MYOCARDIAL PERFUSION IMAGING
LV dias vol: 111 mL (ref 46–106)
LV sys vol: 50 mL
Peak HR: 77 {beats}/min
Rest HR: 49 {beats}/min
SDS: 0
SRS: 0
SSS: 0
TID: 0.93

## 2021-01-17 MED ORDER — TECHNETIUM TC 99M TETROFOSMIN IV KIT
30.6000 | PACK | Freq: Once | INTRAVENOUS | Status: AC | PRN
  Administered 2021-01-17: 30.6 via INTRAVENOUS
  Filled 2021-01-17: qty 31

## 2021-01-17 MED ORDER — REGADENOSON 0.4 MG/5ML IV SOLN
0.4000 mg | Freq: Once | INTRAVENOUS | Status: AC
  Administered 2021-01-17: 0.4 mg via INTRAVENOUS

## 2021-01-17 MED ORDER — TECHNETIUM TC 99M TETROFOSMIN IV KIT
10.7000 | PACK | Freq: Once | INTRAVENOUS | Status: AC | PRN
Start: 2021-01-17 — End: 2021-01-17
  Administered 2021-01-17: 10.7 via INTRAVENOUS
  Filled 2021-01-17: qty 11

## 2021-01-21 ENCOUNTER — Other Ambulatory Visit: Payer: Self-pay

## 2021-01-21 ENCOUNTER — Telehealth: Payer: Self-pay | Admitting: Internal Medicine

## 2021-01-21 MED ORDER — PAIN MANAGEMENT IT PUMP REFILL: 1.0000 | Freq: Once | INTRATHECAL | 0 refills | Status: AC

## 2021-01-21 NOTE — Telephone Encounter (Signed)
Monitor strips reviewed with Dr. Allyson Sabal, DOD. No change in treatment plan or follow-up.

## 2021-01-21 NOTE — Telephone Encounter (Signed)
Received call directly from operator. Marisue Ivan with [employer] reports at 1244 central time patient had 11 beats of v tach with return to sinus rhythm. She is faxing the report to [facility] office. Spoke with patient who states she has been asymptomatic today. She reports that she is taking metoprolol succinate 50 mg every morning. When asked about additional metoprolol tartrate 25 mg which was prescribed for palpitations/fast HR, she reports she was advised to take Eliquis 2.5 mg for palpitations. I reviewed the instructions with her to take Eliquis 5 mg BID and to take the metoprolol tartrate 25 mg prn palpitations. She states she has only taken a few doses of Eliquis 2.5 mg in addition to her 5 mg BID. I will review rhythm strips with DOD and call patient back with additional advice if needed.

## 2021-01-21 NOTE — Telephone Encounter (Signed)
    Kendra Nelson with [employer] calling to give abnormal result

## 2021-01-22 DIAGNOSIS — L409 Psoriasis, unspecified: Secondary | ICD-10-CM | POA: Diagnosis not present

## 2021-01-22 DIAGNOSIS — M5417 Radiculopathy, lumbosacral region: Secondary | ICD-10-CM | POA: Diagnosis not present

## 2021-01-22 DIAGNOSIS — Z87891 Personal history of nicotine dependence: Secondary | ICD-10-CM | POA: Diagnosis not present

## 2021-01-22 DIAGNOSIS — T84023D Instability of internal left knee prosthesis, subsequent encounter: Secondary | ICD-10-CM | POA: Diagnosis not present

## 2021-01-22 DIAGNOSIS — M5136 Other intervertebral disc degeneration, lumbar region: Secondary | ICD-10-CM | POA: Diagnosis not present

## 2021-01-22 DIAGNOSIS — M5134 Other intervertebral disc degeneration, thoracic region: Secondary | ICD-10-CM | POA: Diagnosis not present

## 2021-01-22 DIAGNOSIS — G2581 Restless legs syndrome: Secondary | ICD-10-CM | POA: Diagnosis not present

## 2021-01-22 DIAGNOSIS — Z7901 Long term (current) use of anticoagulants: Secondary | ICD-10-CM | POA: Diagnosis not present

## 2021-01-22 DIAGNOSIS — G43909 Migraine, unspecified, not intractable, without status migrainosus: Secondary | ICD-10-CM | POA: Diagnosis not present

## 2021-01-22 DIAGNOSIS — I10 Essential (primary) hypertension: Secondary | ICD-10-CM | POA: Diagnosis not present

## 2021-01-22 DIAGNOSIS — M069 Rheumatoid arthritis, unspecified: Secondary | ICD-10-CM | POA: Diagnosis not present

## 2021-01-22 DIAGNOSIS — R69 Illness, unspecified: Secondary | ICD-10-CM | POA: Diagnosis not present

## 2021-01-22 DIAGNOSIS — E7800 Pure hypercholesterolemia, unspecified: Secondary | ICD-10-CM | POA: Diagnosis not present

## 2021-01-22 DIAGNOSIS — G894 Chronic pain syndrome: Secondary | ICD-10-CM | POA: Diagnosis not present

## 2021-01-24 ENCOUNTER — Telehealth: Payer: Self-pay | Admitting: Physician Assistant

## 2021-01-24 NOTE — Telephone Encounter (Signed)
   [employer] called the answering service after-hours today. Recent OV reviewed - h/o PAF and NSVT seen on event monitor. Patient on BB/Eliquis. Recent reassuring stress test. [employer] called to notify that patient had 5-8 beats NSVT, but also could be abberrancy given intermittent PAF seen as well as NSR. This is consistent with what was reporetd in recent OV. Spoke with patient's spouse who confirms patient was asymptomatic/sleeping at time of event. Will route to provider who last saw patient, Azalee Course. I do see he recommended referral to EP but do not see appt pending at this time.   Laurann Montana, PA-C

## 2021-01-28 NOTE — Telephone Encounter (Signed)
The patient was referral to EP service, not sure why the appt has not been scheduled at this time.

## 2021-01-30 ENCOUNTER — Ambulatory Visit: Payer: Medicare HMO | Admitting: Physician Assistant

## 2021-02-04 NOTE — Telephone Encounter (Signed)
Patient referred to EP, please see other telephone encounter.

## 2021-02-06 NOTE — Telephone Encounter (Signed)
Patient scheduled to see Azalee Course on 6/22 and Dr. Elberta Fortis on 6/24.

## 2021-02-14 ENCOUNTER — Ambulatory Visit: Payer: Medicare HMO | Admitting: Adult Health

## 2021-02-18 ENCOUNTER — Ambulatory Visit: Payer: Medicare HMO | Attending: Pain Medicine | Admitting: Pain Medicine

## 2021-02-18 ENCOUNTER — Other Ambulatory Visit: Payer: Self-pay

## 2021-02-18 ENCOUNTER — Telehealth: Payer: Self-pay | Admitting: *Deleted

## 2021-02-18 VITALS — BP 145/72 | HR 59 | Temp 96.8°F | Resp 16 | Ht 63.0 in | Wt 165.0 lb

## 2021-02-18 DIAGNOSIS — G894 Chronic pain syndrome: Secondary | ICD-10-CM | POA: Diagnosis not present

## 2021-02-18 DIAGNOSIS — M79604 Pain in right leg: Secondary | ICD-10-CM

## 2021-02-18 DIAGNOSIS — M5442 Lumbago with sciatica, left side: Secondary | ICD-10-CM

## 2021-02-18 DIAGNOSIS — Z451 Encounter for adjustment and management of infusion pump: Secondary | ICD-10-CM | POA: Diagnosis not present

## 2021-02-18 DIAGNOSIS — M5417 Radiculopathy, lumbosacral region: Secondary | ICD-10-CM | POA: Diagnosis not present

## 2021-02-18 DIAGNOSIS — F1120 Opioid dependence, uncomplicated: Secondary | ICD-10-CM | POA: Diagnosis not present

## 2021-02-18 DIAGNOSIS — Z978 Presence of other specified devices: Secondary | ICD-10-CM

## 2021-02-18 DIAGNOSIS — Z79899 Other long term (current) drug therapy: Secondary | ICD-10-CM | POA: Diagnosis not present

## 2021-02-18 DIAGNOSIS — G8929 Other chronic pain: Secondary | ICD-10-CM

## 2021-02-18 DIAGNOSIS — M5136 Other intervertebral disc degeneration, lumbar region: Secondary | ICD-10-CM | POA: Insufficient documentation

## 2021-02-18 DIAGNOSIS — R69 Illness, unspecified: Secondary | ICD-10-CM | POA: Diagnosis not present

## 2021-02-18 DIAGNOSIS — M961 Postlaminectomy syndrome, not elsewhere classified: Secondary | ICD-10-CM | POA: Insufficient documentation

## 2021-02-18 MED ORDER — MORPHINE SULFATE ER 30 MG PO TBCR: 30.0000 mg | EXTENDED_RELEASE_TABLET | Freq: Two times a day (BID) | ORAL | 0 refills | Status: DC

## 2021-02-18 NOTE — Progress Notes (Signed)
Nursing Pain Medication Assessment:  Safety precautions to be maintained throughout the outpatient stay will include: orient to surroundings, keep bed in low position, maintain call bell within reach at all times, provide assistance with transfer out of bed and ambulation.  Medication Inspection Compliance: Pill count conducted under aseptic conditions, in front of the patient. Neither the pills nor the bottle was removed from the patient's sight at any time. Once count was completed pills were immediately returned to the patient in their original bottle.  Medication: Morphine ER (MSContin) Pill/Patch Count:  0 of 60 pills remain Pill/Patch Appearance: Markings consistent with prescribed medication Bottle Appearance: Standard pharmacy container. Clearly labeled. Filled Date: 05 / 22 / 2022 Last Medication intake:  Today

## 2021-02-18 NOTE — Patient Instructions (Addendum)
____________________________________________________________________________________________  Medication Rules  Purpose: To inform patients, and their family members, of our rules and regulations.  Applies to: All patients receiving prescriptions (written or electronic).  Pharmacy of record: Pharmacy where electronic prescriptions will be sent. If written prescriptions are taken to a different pharmacy, please inform the nursing staff. The pharmacy listed in the electronic medical record should be the one where you would like electronic prescriptions to be sent.  Electronic prescriptions: In compliance with the [place] Strengthen Opioid Misuse Prevention (STOP) Act of 2017 (Session Law 2017-74/H243), effective August 31, 2018, all controlled substances must be electronically prescribed. Calling prescriptions to the pharmacy will cease to exist.  Prescription refills: Only during scheduled appointments. Applies to all prescriptions.  NOTE: The following applies primarily to controlled substances (Opioid* Pain Medications).   Type of encounter (visit): For patients receiving controlled substances, face-to-face visits are required. (Not an option or up to the patient.)  Patient's responsibilities: Pain Pills: Bring all pain pills to every appointment (except for procedure appointments). Pill Bottles: Bring pills in original pharmacy bottle. Always bring the newest bottle. Bring bottle, even if empty. Medication refills: You are responsible for knowing and keeping track of what medications you take and those you need refilled. The day before your appointment: write a list of all prescriptions that need to be refilled. The day of the appointment: give the list to the admitting nurse. Prescriptions will be written only during appointments. No prescriptions will be written on procedure days. If you forget a medication: it will not be "Called in", "Faxed", or "electronically sent". You will  need to get another appointment to get these prescribed. No early refills. Do not call asking to have your prescription filled early. Prescription Accuracy: You are responsible for carefully inspecting your prescriptions before leaving our office. Have the discharge nurse carefully go over each prescription with you, before taking them home. Make sure that your name is accurately spelled, that your address is correct. Check the name and dose of your medication to make sure it is accurate. Check the number of pills, and the written instructions to make sure they are clear and accurate. Make sure that you are given enough medication to last until your next medication refill appointment. Taking Medication: Take medication as prescribed. When it comes to controlled substances, taking less pills or less frequently than prescribed is permitted and encouraged. Never take more pills than instructed. Never take medication more frequently than prescribed.  Inform other Doctors: Always inform, all of your healthcare providers, of all the medications you take. Pain Medication from other Providers: You are not allowed to accept any additional pain medication from any other Doctor or Healthcare provider. There are two exceptions to this rule. (see below) In the event that you require additional pain medication, you are responsible for notifying us, as stated below. Cough Medicine: Often these contain an opioid, such as codeine or hydrocodone. Never accept or take cough medicine containing these opioids if you are already taking an opioid* medication. The combination may cause respiratory failure and death. Medication Agreement: You are responsible for carefully reading and following our Medication Agreement. This must be signed before receiving any prescriptions from our practice. Safely store a copy of your signed Agreement. Violations to the Agreement will result in no further prescriptions. (Additional copies of our  Medication Agreement are available upon request.) Laws, Rules, & Regulations: All patients are expected to follow all Federal and State Laws, Statutes, Rules, & Regulations. Ignorance of   the Laws does not constitute a valid excuse.  Illegal drugs and Controlled Substances: The use of illegal substances (including, but not limited to marijuana and its derivatives) and/or the illegal use of any controlled substances is strictly prohibited. Violation of this rule may result in the immediate and permanent discontinuation of any and all prescriptions being written by our practice. The use of any illegal substances is prohibited. Adopted CDC guidelines & recommendations: Target dosing levels will be at or below 60 MME/day. Use of benzodiazepines** is not recommended.  Exceptions: There are only two exceptions to the rule of not receiving pain medications from other Healthcare Providers. Exception #1 (Emergencies): In the event of an emergency (i.e.: accident requiring emergency care), you are allowed to receive additional pain medication. However, you are responsible for: As soon as you are able, call our office [phone], at any time of the day or night, and leave a message stating your name, the date and nature of the emergency, and the name and dose of the medication prescribed. In the event that your call is answered by a member of our staff, make sure to document and save the date, time, and the name of the person that took your information.  Exception #2 (Planned Surgery): In the event that you are scheduled by another doctor or dentist to have any type of surgery or procedure, you are allowed (for a period no longer than 30 days), to receive additional pain medication, for the acute post-op pain. However, in this case, you are responsible for picking up a copy of our "Post-op Pain Management for Surgeons" handout, and giving it to your surgeon or dentist. This document is available at our office, and  does not require an appointment to obtain it. Simply go to our office during business hours (Monday-Thursday from 8:00 AM to 4:00 PM) (Friday 8:00 AM to 12:00 Noon) or if you have a scheduled appointment with us, prior to your surgery, and ask for it by name. In addition, you are responsible for: calling our office [phone], at any time of the day or night, and leaving a message stating your name, name of your surgeon, type of surgery, and date of procedure or surgery. Failure to comply with your responsibilities may result in termination of therapy involving the controlled substances.  *Opioid medications include: morphine, codeine, oxycodone, oxymorphone, hydrocodone, hydromorphone, meperidine, tramadol, tapentadol, buprenorphine, fentanyl, methadone. **Benzodiazepine medications include: diazepam (Valium), alprazolam (Xanax), clonazepam (Klonopine), lorazepam (Ativan), clorazepate (Tranxene), chlordiazepoxide (Librium), estazolam (Prosom), oxazepam (Serax), temazepam (Restoril), triazolam (Halcion) (Last updated: 07/29/2020) ____________________________________________________________________________________________  ____________________________________________________________________________________________  Medication Recommendations and Reminders  Applies to: All patients receiving prescriptions (written and/or electronic).  Medication Rules & Regulations: These rules and regulations exist for your safety and that of others. They are not flexible and neither are we. Dismissing or ignoring them will be considered "non-compliance" with medication therapy, resulting in complete and irreversible termination of such therapy. (See document titled "Medication Rules" for more details.) In all conscience, because of safety reasons, we cannot continue providing a therapy where the patient does not follow instructions.  Pharmacy of record:  Definition: This is the pharmacy where your electronic  prescriptions will be sent.  We do not endorse any particular pharmacy, however, we have experienced problems with [facility] not securing enough medication supply for the community. We do not restrict you in your choice of pharmacy. However, once we write for your prescriptions, we will NOT be re-sending more prescriptions to fix restricted supply problems   created by your pharmacy, or your insurance.  The pharmacy listed in the electronic medical record should be the one where you want electronic prescriptions to be sent. If you choose to change pharmacy, simply notify our nursing staff.  Recommendations: Keep all of your pain medications in a safe place, under lock and key, even if you live alone. We will NOT replace lost, stolen, or damaged medication. After you fill your prescription, take 1 week's worth of pills and put them away in a safe place. You should keep a separate, properly labeled bottle for this purpose. The remainder should be kept in the original bottle. Use this as your primary supply, until it runs out. Once it's gone, then you know that you have 1 week's worth of medicine, and it is time to come in for a prescription refill. If you do this correctly, it is unlikely that you will ever run out of medicine. To make sure that the above recommendation works, it is very important that you make sure your medication refill appointments are scheduled at least 1 week before you run out of medicine. To do this in an effective manner, make sure that you do not leave the office without scheduling your next medication management appointment. Always ask the nursing staff to show you in your prescription , when your medication will be running out. Then arrange for the receptionist to get you a return appointment, at least 7 days before you run out of medicine. Do not wait until you have 1 or 2 pills left, to come in. This is very poor planning and does not take into consideration that we may need to  cancel appointments due to bad weather, sickness, or emergencies affecting our staff. DO NOT ACCEPT A "Partial Fill": If for any reason your pharmacy does not have enough pills/tablets to completely fill or refill your prescription, do not allow for a "partial fill". The law allows the pharmacy to complete that prescription within 72 hours, without requiring a new prescription. If they do not fill the rest of your prescription within those 72 hours, you will need a separate prescription to fill the remaining amount, which we will NOT provide. If the reason for the partial fill is your insurance, you will need to talk to the pharmacist about payment alternatives for the remaining tablets, but again, DO NOT ACCEPT A PARTIAL FILL, unless you can trust your pharmacist to obtain the remainder of the pills within 72 hours.  Prescription refills and/or changes in medication(s):  Prescription refills, and/or changes in dose or medication, will be conducted only during scheduled medication management appointments. (Applies to both, written and electronic prescriptions.) No refills on procedure days. No medication will be changed or started on procedure days. No changes, adjustments, and/or refills will be conducted on a procedure day. Doing so will interfere with the diagnostic portion of the procedure. No phone refills. No medications will be "called into the pharmacy". No Fax refills. No weekend refills. No Holliday refills. No after hours refills.  Remember:  Business hours are:  Monday to Thursday 8:00 AM to 4:00 PM Provider's Schedule: Francisco Naveira, MD - Appointments are:  Medication management: Monday and Wednesday 8:00 AM to 4:00 PM Procedure day: Tuesday and Thursday 7:30 AM to 4:00 PM Bilal Lateef, MD - Appointments are:  Medication management: Tuesday and Thursday 8:00 AM to 4:00 PM Procedure day: Monday and Wednesday 7:30 AM to 4:00 PM (Last update:  03/20/2020) ____________________________________________________________________________________________  ____________________________________________________________________________________________  CBD (cannabidiol) WARNING    Applicable to: All individuals currently taking or considering taking CBD (cannabidiol) and, more important, all patients taking opioid analgesic controlled substances (pain medication). (Example: oxycodone; oxymorphone; hydrocodone; hydromorphone; morphine; methadone; tramadol; tapentadol; fentanyl; buprenorphine; butorphanol; dextromethorphan; meperidine; codeine; etc.)  Legal status: CBD remains a Schedule I drug prohibited for any use. CBD is illegal with one exception. In the [place], CBD has a limited Food and Drug Administration (FDA) approval for the treatment of two specific types of epilepsy disorders. Only one CBD product has been approved by the FDA for this purpose: "Epidiolex". FDA is aware that some companies are marketing products containing cannabis and cannabis-derived compounds in ways that violate the Federal Food, Drug and Cosmetic Act (FD&C Act) and that may put the health and safety of consumers at risk. The FDA, a Federal agency, has not enforced the CBD status since 2018.   Legality: Some manufacturers ship CBD products nationally, which is illegal. Often such products are sold online and are therefore available throughout the country. CBD is openly sold in head shops and health food stores in some states where such sales have not been explicitly legalized. Selling unapproved products with unsubstantiated therapeutic claims is not only a violation of the law, but also can put patients at risk, as these products have not been proven to be safe or effective. Federal illegality makes it difficult to conduct research on CBD.  Reference: "FDA Regulation of Cannabis and Cannabis-Derived Products, Including Cannabidiol (CBD)" -  https://www.fda.gov/news-events/public-health-focus/fda-regulation-cannabis-and-cannabis-derived-products-including-cannabidiol-cbd  Warning: CBD is not FDA approved and has not undergo the same manufacturing controls as prescription drugs.  This means that the purity and safety of available CBD may be questionable. Most of the time, despite manufacturer's claims, it is contaminated with THC (delta-9-tetrahydrocannabinol - the chemical in marijuana responsible for the "HIGH").  When this is the case, the THC contaminant will trigger a positive urine drug screen (UDS) test for Marijuana (carboxy-THC). Because a positive UDS for any illicit substance is a violation of our medication agreement, your opioid analgesics (pain medicine) may be permanently discontinued.  MORE ABOUT CBD  General Information: CBD  is a derivative of the Marijuana (cannabis sativa) plant discovered in 1940. It is one of the 113 identified substances found in Marijuana. It accounts for up to 40% of the plant's extract. As of 2018, preliminary clinical studies on CBD included research for the treatment of anxiety, movement disorders, and pain. CBD is available and consumed in multiple forms, including inhalation of smoke or vapor, as an aerosol spray, and by mouth. It may be supplied as an oil containing CBD, capsules, dried cannabis, or as a liquid solution. CBD is thought not to be as psychoactive as THC (delta-9-tetrahydrocannabinol - the chemical in marijuana responsible for the "HIGH"). Studies suggest that CBD may interact with different biological target receptors in the body, including cannabinoid and other neurotransmitter receptors. As of 2018 the mechanism of action for its biological effects has not been determined.  Side-effects  Adverse reactions: Dry mouth, diarrhea, decreased appetite, fatigue, drowsiness, malaise, weakness, sleep disturbances, and others.  Drug interactions: CBC may interact with other medications  such as blood-thinners. (Last update: 04/06/2020) ____________________________________________________________________________________________  ____________________________________________________________________________________________  Drug Holidays (Slow)  What is a "Drug Holiday"? Drug Holiday: is the name given to the period of time during which a patient stops taking a medication(s) for the purpose of eliminating tolerance to the drug.  Benefits Improved effectiveness of opioids. Decreased opioid dose needed to achieve benefits. Improved pain with lesser dose.    What is tolerance? Tolerance: is the progressive decreased in effectiveness of a drug due to its repetitive use. With repetitive use, the body gets use to the medication and as a consequence, it loses its effectiveness. This is a common problem seen with opioid pain medications. As a result, a larger dose of the drug is needed to achieve the same effect that used to be obtained with a smaller dose.  How long should a "Drug Holiday" last? You should stay off of the pain medicine for at least 14 consecutive days. (2 weeks)  Should I stop the medicine "cold [place]"? No. You should always coordinate with your Pain Specialist so that he/she can provide you with the correct medication dose to make the transition as smoothly as possible.  How do I stop the medicine? Slowly. You will be instructed to decrease the daily amount of pills that you take by one (1) pill every seven (7) days. This is called a "slow downward taper" of your dose. For example: if you normally take four (4) pills per day, you will be asked to drop this dose to three (3) pills per day for seven (7) days, then to two (2) pills per day for seven (7) days, then to one (1) per day for seven (7) days, and at the end of those last seven (7) days, this is when the "Drug Holiday" would start.   Will I have withdrawals? By doing a "slow downward taper" like this one, it  is unlikely that you will experience any significant withdrawal symptoms. Typically, what triggers withdrawals is the sudden stop of a high dose opioid therapy. Withdrawals can usually be avoided by slowly decreasing the dose over a prolonged period of time. If you do not follow these instructions and decide to stop your medication abruptly, withdrawals may be possible.  What are withdrawals? Withdrawals: refers to the wide range of symptoms that occur after stopping or dramatically reducing opiate drugs after heavy and prolonged use. Withdrawal symptoms do not occur to patients that use low dose opioids, or those who take the medication sporadically. Contrary to benzodiazepine (example: Valium, Xanax, etc.) or alcohol withdrawals ("Delirium Tremens"), opioid withdrawals are not lethal. Withdrawals are the physical manifestation of the body getting rid of the excess receptors.  Expected Symptoms Early symptoms of withdrawal may include: Agitation Anxiety Muscle aches Increased tearing Insomnia Runny nose Sweating Yawning  Late symptoms of withdrawal may include: Abdominal cramping Diarrhea Dilated pupils Goose bumps Nausea Vomiting  Will I experience withdrawals? Due to the slow nature of the taper, it is very unlikely that you will experience any.  What is a slow taper? Taper: refers to the gradual decrease in dose.  (Last update: 03/20/2020) ____________________________________________________________________________________________   Opioid Overdose Opioids are drugs that are often used to treat pain. Opioids include illegal drugs, such as heroin, as well as prescription pain medicines, such as codeine, morphine, hydrocodone, oxycodone, and fentanyl. An opioid overdose happens when you take too much of an opioid. An overdose may be intentional or accidentaland can happen with any type of opioid. The effects of an overdose can be mild, dangerous, or even deadly. Opioidoverdose is  a medical emergency. What are the causes? This condition may be caused by: Taking too much of an opioid on purpose. Taking too much of an opioid by accident. Using two or more substances that contain opioids at the same time. Taking an opioid with a substance that affects your heart, breathing, or blood pressure. These include  alcohol, tranquilizers, sleeping pills, illegal drugs, and some over-the-counter medicines. This condition may also happen due to an error made by: A health care provider who prescribes a medicine. The pharmacist who fills the prescription order. What increases the risk? This condition is more likely in: Children. They may be attracted to colorful pills. Because of a child's small size, even a small amount of a drug can be dangerous. Older people. They may be taking many different drugs. Older people may have difficulty reading labels or remembering when they last took their medicine. They may also be more sensitive to the effects of opioids. People with chronic medical conditions, especially heart, liver, kidney, or neurological diseases. People who take an opioid for a long period of time. People who use: Illegal drugs. IV heroin is especially dangerous. Other substances, including alcohol, while using an opioid. People who have: A history of drug or alcohol abuse. Certain mental health conditions. A history of previous drug overdoses. People who take opioids that are not prescribed for them. What are the signs or symptoms? Symptoms of this condition depend on the type of opioid and the amount that was taken. Common symptoms include: Sleepiness or difficulty waking from sleep. Decrease in attention. Confusion. Slurred speech. Slowed breathing and a slow pulse (bradycardia). Nausea and vomiting. Abnormally small pupils. Signs and symptoms that require emergency treatment include: Cold, clammy, and pale skin. Blue lips and fingernails. Vomiting. Gurgling  sounds in the throat. A pulse that is very slow or difficult to detect. Breathing that is very irregular, slow, noisy, or difficult to detect. Limp body. Inability to respond to speech or be awakened from sleep (stupor). Seizures. How is this diagnosed? This condition is diagnosed based on your symptoms and medical history. It is important to tell your health care provider: About all of the opioids that you took. When you took the opioids. Whether you were drinking alcohol or using marijuana, cocaine, or other drugs. Your health care provider will do a physical exam. This exam may include: Checking and monitoring your heart rate and rhythm, breathing rate, temperature, and blood pressure (vital signs). Measuring oxygen levels in your blood. Checking for abnormally small pupils. You may also have blood tests or urine tests. You may have X-rays if you arehaving severe breathing problems. How is this treated? This condition requires immediate medical treatment and hospitalization. Treatment is given in the hospital intensive care (ICU) setting. Supporting your vital signs and your breathing is the first step in treating an opioid overdose. Treatment may also include: Giving salts and minerals (electrolytes) along with fluids through an IV. Inserting a breathing tube (endotracheal tube) in your airway to help you breathe if you cannot breathe on your own or you are in danger of not being able to breathe on your own. Giving oxygen through a small tube under your nose. Passing a tube through your nose and into your stomach (nasogastric tube, or NG tube) to empty your stomach. Giving medicines that: Increase your blood pressure. Relieve nausea and vomiting. Relieve abdominal pain and cramping. Reverse the effects of the opioid (naloxone). Monitoring your heart and oxygen levels. Ongoing counseling and mental health support if you intentionally overdosed or used an illegal drug. Follow these  instructions at home:  Medicines Take over-the-counter and prescription medicines only as told by your health care provider. Always ask your health care provider about possible side effects and interactions of any new medicine that you start taking. Keep a list of all the medicines  that you take, including over-the-counter medicines. Bring this list with you to all your medical visits. General instructions Drink enough fluid to keep your urine pale yellow. Keep all follow-up visits as told by your health care provider. This is important. How is this prevented? Read the drug inserts that come with your opioid pain medicines. Take medicines only as told by your health care provider. Do not take more medicine than you are told. Do not take medicines more frequently than you are told. Do not drink alcohol or take sedatives when taking opioids. Do not use illegal or recreational drugs, including cocaine, ecstasy, and marijuana. Do not take opioid medicines that are not prescribed for you. Store all medicines in safety containers that are out of the reach of children. Get help if you are struggling with: Alcohol or drug use. Depression or another mental health problem. Thoughts of hurting yourself or another person. Keep the phone number of your local poison control center near your phone or in your mobile phone. In the [place] the hotline of the [facility] is 816-369-9105. If you were prescribed naloxone, make sure you understand how to take it. Contact a health care provider if you: Need help understanding how to take your pain medicines. Feel your medicines are too strong. Are concerned that your pain medicines are not working well for your pain. Develop new symptoms or side effects when you are taking medicines. Get help right away if: You or someone else is having symptoms of an opioid overdose. Get help even if you are not sure. You have serious thoughts about hurting  yourself or others. You have: Chest pain. Difficulty breathing. A loss of consciousness. These symptoms may represent a serious problem that is an emergency. Do not wait to see if the symptoms will go away. Get medical help right away. Call your local emergency services (911 in the [place] Do not drive yourself to the hospital. If you ever feel like you may hurt yourself or others, or have thoughts about taking your own life, get help right away. You can go to your nearest emergency department or call: Your local emergency services (911 in the [place] A suicide crisis helpline, such as the National Suicide Prevention Lifeline at 435-098-6134. This is open 24 hours a day. Summary Opioids are drugs that are often used to treat pain. Opioids include illegal drugs, such as heroin, as well as prescription pain medicines. An opioid overdose happens when you take too much of an opioid. Overdoses can be intentional or accidental. Opioid overdose is very dangerous. It is a life-threatening emergency. If you or someone you know is experiencing an opioid overdose, get help right away. This information is not intended to replace advice given to you by your health care provider. Make sure you discuss any questions you have with your healthcare provider. Document Revised: 08/04/2018 Document Reviewed: 08/04/2018 [employer] Patient Education  2022 [employer].

## 2021-02-18 NOTE — Telephone Encounter (Signed)
Called pharmacy, cancelled the 3 scripts for MS Contin that contained the incorrect dates.

## 2021-02-18 NOTE — Progress Notes (Signed)
PROVIDER NOTE: Information contained herein reflects review and annotations entered in association with encounter. Interpretation of such information and data should be left to medically-trained personnel. Information provided to patient can be located elsewhere in the medical record under "Patient Instructions". Document created using STT-dictation technology, any transcriptional errors that may result from process are unintentional.    Patient: Kendra Nelson  Service Category: Procedure  Provider: Gaspar Cola, MD  DOB: March 10, 1961  DOS: 02/18/2021  Location: [facility]  MRN: [id number]  Setting: Ambulatory - outpatient  Referring Provider: Berkley Harvey, NP  Type: Established Patient  Specialty: Interventional Pain Management  PCP: Berkley Harvey, NP   Primary Reason for Visit: Interventional Pain Management Treatment. CC: Back Pain (low)  Procedure:          Intrathecal Drug Delivery System (IDDS):  Type: Reservoir Refill [phone])       Region: Abdominal Laterality: Left  Type of Pump: Medtronic Synchromed II Delivery Route: Intrathecal Type of Pain Treated: Neuropathic/Nociceptive Primary Medication Class: Opioid/opiate  Medication, Concentration, Infusion Program, & Delivery Rate: Please see scanned programming printout.   Indications: 1. 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 Chronic low back pain (Primary Area of Pain) (Bilateral) (R>L)   4. Chronic lower extremity pain (Secondary Area of Pain) (Bilateral) (R>L)   5. DDD (degenerative disc disease), lumbar   6. Chronic lumbosacral radiculopathy (L5) (Left)   7. Presence of intrathecal pump   8. Adjustment and management of infusion pump   9. Pharmacologic therapy   10. Uncomplicated opioid dependence ([place])    Pain Assessment: Self-Reported Pain Score: 3 /10             Reported level is compatible with observation.        Pharmacotherapy  Assessment  Analgesic: Morphine (MS Contin) 30 mg PO BID (60 mg/day of morphine) (60 MME/day) +  PF-(intrathecal)-Fentanyl (13.4 mcg/hr)  changed from Oxycodone IR 10 mg 4x/day (40 mg/day of oxycodone) (60 MME/day), due to national shortage MME/day: 60 MME/day (oral) + 32.16 MME/day (intrathecal) = 93.16 MME/day (Aprox. 1.16 MME/day/kg).   Monitoring: [place] PMP: PDMP reviewed during this encounter.       Pharmacotherapy: No side-effects or adverse reactions reported. Compliance: No problems identified. Effectiveness: Clinically acceptable. Plan: Refer to "POC".  UDS:  Summary  Date Value Ref Range Status  03/23/2019 Note  Final    Comment:    ==================================================================== ToxASSURE Select 13 (MW) ==================================================================== Test                             Result       Flag       Units Drug Present and Declared for Prescription Verification   Oxycodone                      4297         EXPECTED   ng/mg creat   Oxymorphone                    1874         EXPECTED   ng/mg creat   Noroxycodone                   3680         EXPECTED   ng/mg creat   Noroxymorphone  467          EXPECTED   ng/mg creat    Sources of oxycodone are scheduled prescription medications.    Oxymorphone, noroxycodone, and noroxymorphone are expected    metabolites of oxycodone. Oxymorphone is also available as a    scheduled prescription medication. Drug Present not Declared for Prescription Verification   Fentanyl                       38           UNEXPECTED ng/mg creat   Norfentanyl                    57           UNEXPECTED ng/mg creat    Source of fentanyl is a scheduled prescription medication, including    IV, patch, and transmucosal formulations. Norfentanyl is an expected    metabolite of fentanyl. ==================================================================== Test                      Result    Flag    Units      Ref Range   Creatinine              107              mg/dL      >=20 ==================================================================== Declared Medications:  The flagging and interpretation on this report are based on the  following declared medications.  Unexpected results may arise from  inaccuracies in the declared medications.  **Note: The testing scope of this panel includes these medications:  Oxycodone  **Note: The testing scope of this panel does not include the  following reported medications:  Acitretin  Atorvastatin  Cyclobenzaprine  Lisinopril  Meloxicam  Metoprolol  Multivitamin  Ropinirole (Requip) ==================================================================== For clinical consultation, please call [phone]. ====================================================================     Intrathecal Pump Therapy Assessment  Manufacturer: Medtronic Synchromed Type: Programmable Volume: 40 mL reservoir MRI compatibility: Yes   Drug content:  Primary Medication Class: Opioid Primary Medication: PF-Fentanyl (1,000 mcg/mL)  Secondary Medication: PF-Bupivacaine (20.0 mg/mL)  Other Medication: see pump readout   Programming:  Type: Simple continuous. See pump readout for details.   Changes:  Medication Change: None at this point Rate Change: No change in rate  Reported side-effects or adverse reactions: None reported  Effectiveness: Described as relatively effective, allowing for increase in activities of daily living (ADL) Clinically meaningful improvement in function (CMIF): Sustained CMIF goals met  Plan: Pump refill today  Pre-op H&P Assessment:  Ms. Ohlson is a 60 y.o. (year old), female patient, seen today for interventional treatment. 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 Ms. Duchemin has a current medication list which includes the following prescription(s): AMBULATORY NON FORMULARY MEDICATION, eliquis, atorvastatin, lisinopril, metoprolol succinate, ropinirole, tizanidine, metoprolol tartrate, morphine, [START ON 03/20/2021] morphine, and [START ON 04/19/2021] morphine. Her primarily concern today is the Back Pain (low)  Initial Vital Signs:  Pulse/HCG Rate: (!) 59  Temp: (!) 96.8 F (36 C) Resp: 16 BP: (!) 145/72 SpO2:    BMI: Estimated body mass index is 29.23 kg/m as calculated from the following:   Height as of this encounter: $RemoveBeforeD'5\' 3"'MqzbdRkEvBWplV$  (1.6 m).  Weight as of this encounter: 165 lb (74.8 kg).  Risk Assessment: Allergies: Reviewed. She is allergic to benadryl [diphenhydramine hcl], bee venom, and dexamethasone.  Allergy Precautions: None required Coagulopathies: Reviewed. None identified.  Blood-thinner therapy: None at this time Active Infection(s): Reviewed. None identified. Ms. Gellerman is afebrile  Site Confirmation: Ms. Frede was asked to confirm the procedure and laterality before marking the site Procedure checklist: Completed Consent: Before the procedure and under the influence of no sedative(s), amnesic(s), or anxiolytics, the patient was informed of the treatment options, risks and possible complications. To fulfill our ethical and legal obligations, as recommended by the American Medical Association's Code of Ethics, I have informed the patient of my clinical impression; the nature and purpose of the treatment or procedure; the risks, benefits, and possible complications of the intervention; the alternatives, including doing nothing; the risk(s) and benefit(s) of the alternative treatment(s) or procedure(s); and the risk(s) and benefit(s) of doing  nothing.  Ms. Sherry was provided with information about the general risks and possible complications associated with most interventional procedures. These include, but are not limited to: failure to achieve desired goals, infection, bleeding, organ or nerve damage, allergic reactions, paralysis, and/or death.  In addition, she was informed of those risks and possible complications associated to this particular procedure, which include, but are not limited to: damage to the implant; failure to decrease pain; local, systemic, or serious CNS infections, intraspinal abscess with possible cord compression and paralysis, or life-threatening such as meningitis; bleeding; organ damage; nerve injury or damage with subsequent sensory, motor, and/or autonomic system dysfunction, resulting in transient or permanent pain, numbness, and/or weakness of one or several areas of the body; allergic reactions, either minor or major life-threatening, such as anaphylactic or anaphylactoid reactions.  Furthermore, Ms. Humm was informed of those risks and complications associated with the medications. These include, but are not limited to: allergic reactions (i.e.: anaphylactic or anaphylactoid reactions); endorphine suppression; bradycardia and/or hypotension; water retention and/or peripheral vascular relaxation leading to lower extremity edema and possible stasis ulcers; respiratory depression and/or shortness of breath; decreased metabolic rate leading to weight gain; swelling or edema; medication-induced neural toxicity; particulate matter embolism and blood vessel occlusion with resultant organ, and/or nervous system infarction; and/or intrathecal granuloma formation with possible spinal cord compression and permanent paralysis.  Before refilling the pump Ms. Wanless was informed that some of the medications used in the devise may not be FDA approved for such use and therefore it constitutes an off-label use of the  medications.  Finally, she was informed that Medicine is not an exact science; therefore, there is also the possibility of unforeseen or unpredictable risks and/or possible complications that may result in a catastrophic outcome. The patient indicated having understood very clearly. We have given the patient no guarantees and we have made no promises. Enough time was given to the patient to ask questions, all of which were answered to the patient's satisfaction. Ms. Gravois has indicated that she wanted to continue with the procedure. Attestation: I, the ordering provider, attest that I have discussed with the patient the benefits, risks, side-effects, alternatives, likelihood of achieving goals, and potential problems during recovery for the procedure that I have provided informed consent. Date  Time: 02/18/2021  1:10 PM  Pre-Procedure Preparation:  Monitoring: As per clinic protocol. Respiration, ETCO2, SpO2, BP, heart rate and rhythm monitor placed and checked for adequate function Safety Precautions: Patient was assessed for positional comfort and pressure points before starting the procedure. Time-out:  I initiated and conducted the "Time-out" before starting the procedure, as per protocol. The patient was asked to participate by confirming the accuracy of the "Time Out" information. Verification of the correct person, site, and procedure were performed and confirmed by me, the nursing staff, and the patient. "Time-out" conducted as per Joint Commission's Universal Protocol (UP.01.01.01). Time: 1318  Description of Procedure:          Position: Supine Target Area: Central-port of intrathecal pump. Approach: Anterior, 90 degree angle approach. Area Prepped: Entire Area around the pump implant. DuraPrep (Iodine Povacrylex [0.7% available iodine] and Isopropyl Alcohol, 74% w/w) Safety Precautions: Aspiration looking for blood return was conducted prior to all injections. At no point did we  inject any substances, as a needle was being advanced. No attempts were made at seeking any paresthesias. Safe injection practices and needle disposal techniques used. Medications properly checked for expiration dates. SDV (single dose vial) medications used. Description of the Procedure: Protocol guidelines were followed. Two nurses trained to do implant refills were present during the entire procedure. The refill medication was checked by both healthcare providers as well as the patient. The patient was included in the "Time-out" to verify the medication. The patient was placed in position. The pump was identified. The area was prepped in the usual manner. The sterile template was positioned over the pump, making sure the side-port location matched that of the pump. Both, the pump and the template were held for stability. The needle provided in the Medtronic Kit was then introduced thru the center of the template and into the central port. The pump content was aspirated and discarded volume documented. The new medication was slowly infused into the pump, thru the filter, making sure to avoid overpressure of the device. The needle was then removed and the area cleansed, making sure to leave some of the prepping solution back to take advantage of its long term bactericidal properties. The pump was interrogated and programmed to reflect the correct medication, volume, and dosage. The program was printed and taken to the physician for approval. Once checked and signed by the physician, a copy was provided to the patient and another scanned into the EMR. Vitals:   02/18/21 1306  BP: (!) 145/72  Pulse: (!) 59  Resp: 16  Temp: (!) 96.8 F (36 C)  TempSrc: Temporal  Weight: 165 lb (74.8 kg)  Height: $Remove'5\' 3"'eBZvscn$  (1.6 m)    Start Time: 1318 hrs. End Time: 1329 hrs. Materials & Medications: Medtronic Refill Kit Medication(s): Please see chart orders for details.  Imaging Guidance:          Type of Imaging  Technique: None used Indication(s): N/A Exposure Time: No patient exposure Contrast: None used. Fluoroscopic Guidance: N/A Ultrasound Guidance: N/A Interpretation: N/A  Antibiotic Prophylaxis:   Anti-infectives (From admission, onward)    None      Indication(s): None identified  Post-operative Assessment:  Post-procedure Vital Signs:  Pulse/HCG Rate: (!) 59  Temp: (!) 96.8 F (36 C) Resp: 16 BP: (!) 145/72 SpO2:    EBL: None  Complications: No immediate post-treatment complications observed by team, or reported by patient.  Note: The patient tolerated the entire procedure well. A repeat set of vitals were taken after the procedure and the patient was kept under observation following institutional policy, for this type of procedure. Post-procedural neurological assessment was performed, showing return to baseline, prior to discharge. The patient was provided with post-procedure discharge instructions, including a section on how to identify  potential problems. Should any problems arise concerning this procedure, the patient was given instructions to immediately contact us, at any time, without hesitation. In any case, we plan to contact the patient by telephone for a follow-up status report regarding this interventional procedure.  Comments:  No additional relevant information.  Plan of Care  Orders:  Orders Placed This Encounter  Procedures   PUMP REFILL    Maintain Protocol by having two(2) healthcare providers during procedure and programming.    Scheduling Instructions:     Please refill intrathecal pump today.    Order Specific Question:   Where will this procedure be performed?    Answer:   [facility]   PUMP REFILL    Whenever possible schedule on a procedure today.    Standing Status:   Future    Standing Expiration Date:   07/18/2021    Scheduling Instructions:     Please schedule intrathecal pump refill based on pump programming. Avoid schedule  intervals of more than 120 days (4 months).    Order Specific Question:   Where will this procedure be performed?    Answer:   [facility]   ToxASSURE Select 13 (MW), Urine    Volume: 30 ml(s). Minimum 3 ml of urine is needed. Document temperature of fresh sample. Indications: Long term (current) use of opiate analgesic [phone])    Order Specific Question:   Release to patient    Answer:   Immediate   Informed Consent Details: Physician/Practitioner Attestation; Transcribe to consent form and obtain patient signature    Transcribe to consent form and obtain patient signature.    Order Specific Question:   Physician/Practitioner attestation of informed consent for procedure/surgical case    Answer:   I, the physician/practitioner, attest that I have discussed with the patient the benefits, risks, side effects, alternatives, likelihood of achieving goals and potential problems during recovery for the procedure that I have provided informed consent.    Order Specific Question:   Procedure    Answer:   Intrathecal pump refill    Order Specific Question:   Physician/Practitioner performing the procedure    Answer:   Attending Physician: Kathlen Brunswick. Dossie Arbour, MD & designated trained staff    Order Specific Question:   Indication/Reason    Answer:   Chronic Pain Syndrome (G89.4), presence of an intrathecal pump (Z97.8)   Nursing Instructions:    Please call this patient's pharmacy and cancel any older opioid analgesic scripts that may be left from prior visits.  Tell the pharmacist to only keep those sent today.    Chronic Opioid Analgesic:  Morphine (MS Contin) 30 mg PO BID (60 mg/day of morphine) (60 MME/day) +  PF-(intrathecal)-Fentanyl (13.4 mcg/hr)  changed from Oxycodone IR 10 mg 4x/day (40 mg/day of oxycodone) (60 MME/day), due to national shortage MME/day: 60 MME/day (oral) + 32.16 MME/day (intrathecal) = 93.16 MME/day (Aprox. 1.16 MME/day/kg).   Medications ordered for  procedure: Meds ordered this encounter  Medications   DISCONTD: morphine (MS CONTIN) 30 MG 12 hr tablet    Sig: Take 1 tablet (30 mg total) by mouth every 12 (twelve) hours. Must last 30 days. Do not break tablet    Dispense:  60 tablet    Refill:  0    Not a duplicate. Do NOT delete! Dispense 1 day early if closed on refill date. Avoid benzodiazepines within 8 hours of opioids. Do not send refill requests.   DISCONTD: morphine (MS CONTIN) 30 MG 12  hr tablet    Sig: Take 1 tablet (30 mg total) by mouth every 12 (twelve) hours. Must last 30 days. Do not break tablet    Dispense:  60 tablet    Refill:  0    Not a duplicate. Do NOT delete! Dispense 1 day early if closed on refill date. Avoid benzodiazepines within 8 hours of opioids. Do not send refill requests.   DISCONTD: morphine (MS CONTIN) 30 MG 12 hr tablet    Sig: Take 1 tablet (30 mg total) by mouth every 12 (twelve) hours. Must last 30 days. Do not break tablet    Dispense:  60 tablet    Refill:  0    Not a duplicate. Do NOT delete! Dispense 1 day early if closed on refill date. Avoid benzodiazepines within 8 hours of opioids. Do not send refill requests.   morphine (MS CONTIN) 30 MG 12 hr tablet    Sig: Take 1 tablet (30 mg total) by mouth every 12 (twelve) hours. Must last 30 days. Do not break tablet    Dispense:  60 tablet    Refill:  0    Not a duplicate. Do NOT delete! Dispense 1 day early if closed on refill date. Avoid benzodiazepines within 8 hours of opioids. Do not send refill requests.   morphine (MS CONTIN) 30 MG 12 hr tablet    Sig: Take 1 tablet (30 mg total) by mouth every 12 (twelve) hours. Must last 30 days. Do not break tablet    Dispense:  60 tablet    Refill:  0    Not a duplicate. Do NOT delete! Dispense 1 day early if closed on refill date. Avoid benzodiazepines within 8 hours of opioids. Do not send refill requests.   morphine (MS CONTIN) 30 MG 12 hr tablet    Sig: Take 1 tablet (30 mg total) by mouth every  12 (twelve) hours. Must last 30 days. Do not break tablet    Dispense:  60 tablet    Refill:  0    Not a duplicate. Do NOT delete! Dispense 1 day early if closed on refill date. Avoid benzodiazepines within 8 hours of opioids. Do not send refill requests.    Medications administered: Hassan Rowan A. Riva had no medications administered during this visit.  See the medical record for exact dosing, route, and time of administration.  Follow-up plan:   Return for Pump Refill (Max:73mo.      Interventional Therapies  Risk  Complexity Considerations:   Estimated body mass index is 29.23 kg/m as calculated from the following:   Height as of this encounter: _0  (1.6 m).   Weight as of this encounter: 165 lb (74.8 kg). ELIQUIS Anticoagulation (Stop: 3 days  Restart: 6 hours)   Planned  Pending:   Intrathecal pump refill as per pump programming.   Under consideration:   Diagnostic left IA knee joint inj  Diagnostic bilateral L2 TFESI  Diagnostic left genicular NB  Possible left genicular nerve RFA  Diagnostic caudal ESI + epidurogram  Possible Racz procedure  Diagnostic bilateral lumbar facet block  Possible bilateral lumbar facet RFA  (unlikely due to extensive lumbar fusion from L3-S1)    Completed:   None at this time   Therapeutic  Palliative (PRN) options:   Therapeutic/palliative intrathecal pump  management (analysis and refill)     Recent Visits No visits were found meeting these conditions. Showing recent visits within past 90 days and meeting all other requirements Today's  Visits Date Type Provider Dept  02/18/21 Procedure visit Milinda Pointer, MD [facility]  Showing today's visits and meeting all other requirements Future Appointments Date Type Provider Dept  05/15/21 Appointment Milinda Pointer, MD [facility]  Showing future appointments within next 90 days and meeting all other requirements Disposition: Discharge home  Discharge  (Date  Time): 02/18/2021; 1400 hrs.   Primary Care Physician: Berkley Harvey, NP Location: [facility] Note by: Gaspar Cola, MD Date: 02/18/2021; Time: 3:18 PM  Disclaimer:  Medicine is not an Chief Strategy Officer. The only guarantee in medicine is that nothing is guaranteed. It is important to note that the decision to proceed with this intervention was based on the information collected from the patient. The Data and conclusions were drawn from the patient's questionnaire, the interview, and the physical examination. Because the information was provided in large part by the patient, it cannot be guaranteed that it has not been purposely or unconsciously manipulated. Every effort has been made to obtain as much relevant data as possible for this evaluation. It is important to note that the conclusions that lead to this procedure are derived in large part from the available data. Always take into account that the treatment will also be dependent on availability of resources and existing treatment guidelines, considered by other Pain Management Practitioners as being common knowledge and practice, at the time of the intervention. For Medico-Legal purposes, it is also important to point out that variation in procedural techniques and pharmacological choices are the acceptable norm. The indications, contraindications, technique, and results of the above procedure should only be interpreted and judged by a Board-Certified Interventional Pain Specialist with extensive familiarity and expertise in the same exact procedure and technique.

## 2021-02-19 ENCOUNTER — Telehealth: Payer: Self-pay

## 2021-02-19 ENCOUNTER — Ambulatory Visit: Payer: Medicare HMO | Admitting: Physician Assistant

## 2021-02-19 NOTE — Telephone Encounter (Signed)
LM for post procedure phone call.   

## 2021-02-20 ENCOUNTER — Encounter: Payer: Medicare HMO | Admitting: Pain Medicine

## 2021-02-20 MED FILL — Medication: INTRATHECAL | Qty: 1 | Status: AC

## 2021-02-20 NOTE — Progress Notes (Signed)
Electrophysiology Office Note   Date:  02/21/2021   ID:  Kendra Nelson, DOB [birth year], MRN [id number]  PCP:  Kendra Hansen, NP  Cardiologist:  Kendra Nelson Primary Electrophysiologist:  Kendra Klaas [name], MD   Chief Complaint: AF   History of Present Illness: Kendra Nelson is a 60 y.o. female who is being seen today for the evaluation of AF at the request of Kendra Nelson, [place]. Presenting today for electrophysiology evaluation.  She has a history significant for hypertension, hyperlipidemia, chronic back pain status post spinal cord stimulator, atrial fibrillation.  She was initially planned for knee replacement April 2020, though she did presented to PACU with a narrow complex tachycardia with heart rates of greater than 200 as well as runs of a wide-complex tachycardia.  She was asymptomatic and given esmolol and metoprolol.  ECG showed atrial fibrillation with rapid rates.  She was started on amiodarone.  She converted to sinus rhythm.  Her longest run of wide-complex tachycardia was 30 beats.  She was discharged on metoprolol and amiodarone was stopped prior to discharge.  She wore a 30-day monitor that showed episodes of atrial fibrillation, atrial flutter, and a wide-complex tachycardia during atrial fibrillation.  Today, she denies symptoms of chest pain, shortness of breath, orthopnea, PND, lower extremity edema, claudication, dizziness, presyncope, syncope, bleeding, or neurologic sequela. The patient is tolerating medications without difficulties.  She is continued to have episodic palpitations.  She has no chest pain associate with palpitations.  She does have fatigue and shortness of breath.  She would like to do something to stay in normal rhythm.  Per husband, she also holds her breath at night.  She does have morning headaches.  Past Medical History:  Diagnosis Date   Anxiety    off of valium- stress   Arthritis    left knee- rhumatoid- hasnt seen specialist    Chronic  lower back pain    Complication of anesthesia    WOKE DURING BACK SURGERY ; urinary retention 12/20/2017   Hyperlipidemia    Hypertension    Migraine    "when seasons change" (12/20/2017)   Neuromuscular disorder (HCC)    Restless leg syndrome    Seasonal allergies    Skin abnormalities    Wears glasses    Past Surgical History:  Procedure Laterality Date   BACK SURGERY     8-9 SURGERIES; lower back   COLONOSCOPY     INTRATHECAL PUMP REVISION N/A 05/09/2019   Procedure: Intrathecal pump change;  Surgeon: Odette Fraction, MD;  Location: [facility] OR;  Service: Neurosurgery;  Laterality: N/A;  Intrathecal pump change   JOINT REPLACEMENT     PAIN PUMP IMPLANTATION  05/10/2012   Procedure: PAIN PUMP INSERTION;  Surgeon: Maeola Harman, MD;  Location: [facility];  Service: Neurosurgery;  Laterality: N/A;  Pump replacement   SPINAL CORD STIMULATOR BATTERY EXCHANGE N/A 06/09/2016   Procedure: IMPLANTABLE PULSE GENERATOR REPLACEMENT WITH RECHARGEABLE BATTERY;  Surgeon: Maeola Harman, MD;  Location: [facility] OR;  Service: Neurosurgery;  Laterality: N/A;   TOTAL KNEE ARTHROPLASTY Left 12/20/2017   TOTAL KNEE ARTHROPLASTY Left 12/20/2017   Procedure: TOTAL KNEE ARTHROPLASTY;  Surgeon: Gean Birchwood, MD;  Location: [facility] OR;  Service: Orthopedics;  Laterality: Left;   TOTAL KNEE REVISION Left 12/19/2020   Procedure: TOTAL KNEE REVISION/ Total Knee Polynethylene Barring, Attune Total Knee;  Surgeon: Gean Birchwood, MD;  Location: [facility] ORS;  Service: Orthopedics;  Laterality: Left;   VAGINAL HYSTERECTOMY  Current Outpatient Medications  Medication Sig Dispense Refill   AMBULATORY NON FORMULARY MEDICATION Medication Name:  Intrathecal pump Fentanyl 1,000.0 mcg/ml Bupivacaine 20.0 mg/ml 40 ml pump Rate 357.2 mcg/day     apixaban (ELIQUIS) 5 MG TABS tablet Take 1 tablet (5 mg total) by mouth 2 (two) times daily. 60 tablet 11   atorvastatin (LIPITOR) 20 MG tablet Take 20 mg by mouth every morning.      lisinopril  (ZESTRIL) 20 MG tablet Take 2 tablets (40 mg total) by mouth every morning. 30 tablet 0   metoprolol succinate (TOPROL-XL) 25 MG 24 hr tablet Take 2 tablets (50 mg total) by mouth daily. 180 tablet 3   metoprolol tartrate (LOPRESSOR) 25 MG tablet Take 1 tablet (25 mg total) by mouth every 8 (eight) hours as needed (for palpitations). 30 tablet 2   morphine (MS CONTIN) 30 MG 12 hr tablet Take 1 tablet (30 mg total) by mouth every 12 (twelve) hours. Must last 30 days. Do not break tablet 60 tablet 0   [START ON 03/20/2021] morphine (MS CONTIN) 30 MG 12 hr tablet Take 1 tablet (30 mg total) by mouth every 12 (twelve) hours. Must last 30 days. Do not break tablet 60 tablet 0   [START ON 04/19/2021] morphine (MS CONTIN) 30 MG 12 hr tablet Take 1 tablet (30 mg total) by mouth every 12 (twelve) hours. Must last 30 days. Do not break tablet 60 tablet 0   rOPINIRole (REQUIP) 0.5 MG tablet Take 1 mg by mouth at bedtime. Restless legs     No current facility-administered medications for this visit.    Allergies:   Benadryl [diphenhydramine hcl], Bee venom, and Dexamethasone   Social History:  The patient  reports that she quit smoking about 2 years ago. Her smoking use included cigarettes. She has a 30.00 pack-year smoking history. She has never used smokeless tobacco. She reports current drug use. Drug: Oxycodone. She reports that she does not drink alcohol.   Family History:  The patient's family history includes Aortic aneurysm in her mother; Diabetes in her maternal grandmother.    ROS:  Please see the history of present illness.   Otherwise, review of systems is positive for none.   All other systems are reviewed and negative.    PHYSICAL EXAM: VS:  BP 134/82   Pulse (!) 56   Ht 5\' 3"  (1.6 m)   Wt 177 lb (80.3 kg)   BMI 31.35 kg/m  , BMI Body mass index is 31.35 kg/m. GEN: Well nourished, well developed, in no acute distress  HEENT: normal  Neck: no JVD, carotid bruits, or masses Cardiac:  RRR; no murmurs, rubs, or gallops,no edema  Respiratory:  clear to auscultation bilaterally, normal work of breathing GI: soft, nontender, nondistended, + BS MS: no deformity or atrophy  Skin: warm and dry Neuro:  Strength and sensation are intact Psych: euthymic mood, full affect  EKG:  EKG is ordered today. Personal review of the ekg ordered shows sinus rhythm, rate 56, biatrial enlargement  Recent Labs: 12/19/2020: ALT 26; TSH 0.705 12/20/2020: BUN 16; Creatinine, Ser 0.69; Hemoglobin 14.6; Magnesium 2.1; Platelets 210; Potassium 3.7; Sodium 139    Lipid Panel  No results found for: CHOL, TRIG, HDL, CHOLHDL, VLDL, LDLCALC, LDLDIRECT   Wt Readings from Last 3 Encounters:  02/21/21 177 lb (80.3 kg)  02/18/21 165 lb (74.8 kg)  01/17/21 179 lb (81.2 kg)      Other studies Reviewed: Additional studies/ records that were reviewed  today include: TTE 12/20/20  Review of the above records today demonstrates:   1. Left ventricular ejection fraction, by estimation, is 60 to 65%. The  left ventricle has normal function. The left ventricle has no regional  wall motion abnormalities. Left ventricular diastolic parameters are  consistent with Grade II diastolic  dysfunction (pseudonormalization).   2. Right ventricular systolic function is normal. The right ventricular  size is normal.   3. Left atrial size was moderately dilated.   4. The mitral valve is normal in structure. Trivial mitral valve  regurgitation. No evidence of mitral stenosis.   5. The aortic valve is normal in structure. Aortic valve regurgitation is  not visualized. No aortic stenosis is present.   6. The inferior vena cava is dilated in size with >50% respiratory  variability, suggesting right atrial pressure of 8 mmHg.   Myoview 01/17/21 The left ventricular ejection fraction is normal (55-65%). Nuclear stress EF: 55%. No T wave inversion was noted during stress. There was no ST segment deviation noted during  stress. This is a low risk study.  ASSESSMENT AND PLAN:  1.  Paroxysmal atrial fibrillation/flutter: Noted on cardiac monitoring.  She is currently on Toprol-XL and Eliquis.  CHA2DS2-VASc of 2.  We discussed ablation versus flecainide therapy.  At this point, she would like to consider both and Majed Pellegrin discuss it further with her family.  I do think that she would be a good candidate for ablation.  For now we Dannielle Baskins continue with current management.  She does snore at night and has breath-holding.  She Shila Kruczek also think about whether or not sleep study is reasonable.She has had a quite a bit of wide-complex tachycardia, but this could all be due to aberrancy.  2.  Hypertension: Currently well controlled  3.  Hyperlipidemia: Continue atorvastatin per primary cardiology.  Case discussed with primary cardiology  Current medicines are reviewed at length with the patient today.   The patient does not have concerns regarding her medicines.  The following changes were made today:  none  Labs/ tests ordered today include:  Orders Placed This Encounter  Procedures   EKG 12-Lead      Disposition:   FU with Cyd Hostler 3 months  Signed, Smantha Boakye [name], MD  02/21/2021 9:57 AM     [facility] [address] [place] [address] [phone] (office) [phone] (fax)

## 2021-02-21 ENCOUNTER — Ambulatory Visit: Payer: Medicare HMO | Admitting: Cardiology

## 2021-02-21 ENCOUNTER — Other Ambulatory Visit: Payer: Self-pay

## 2021-02-21 ENCOUNTER — Encounter: Payer: Self-pay | Admitting: Cardiology

## 2021-02-21 VITALS — BP 134/82 | HR 56 | Ht 63.0 in | Wt 177.0 lb

## 2021-02-21 DIAGNOSIS — I480 Paroxysmal atrial fibrillation: Secondary | ICD-10-CM | POA: Diagnosis not present

## 2021-02-21 NOTE — Patient Instructions (Signed)
Medication Instructions:  Your physician recommends that you continue on your current medications as directed. Please refer to the Current Medication list given to you today.  *If you need a refill on your cardiac medications before your next appointment, please call your pharmacy*   Lab Work: None ordered If you have labs (blood work) drawn today and your tests are completely normal, you will receive your results only by: MyChart Message (if you have MyChart) OR A paper copy in the mail If you have any lab test that is abnormal or we need to change your treatment, we will call you to review the results.   Testing/Procedures: None ordered   Follow-Up: At [facility], you and your health needs are our priority.  As part of our continuing mission to provide you with exceptional heart care, we have created designated Provider Care Teams.  These Care Teams include your primary Cardiologist (physician) and Advanced Practice Providers (APPs -  Physician Assistants and Nurse Practitioners) who all work together to provide you with the care you need, when you need it.  We recommend signing up for the patient portal called "MyChart".  Sign up information is provided on this After Visit Summary.  MyChart is used to connect with patients for Virtual Visits (Telemedicine).  Patients are able to view lab/test results, encounter notes, upcoming appointments, etc.  Non-urgent messages can be sent to your provider as well.   To learn more about what you can do with MyChart, go to [email or web address].    Your next appointment:   3 month(s)  The format for your next appointment:   In Person  Provider:   Loman Brooklyn, MD    Thank you for choosing [facility]!!   Dory Horn, RN [phone]   Other Instructions   Cardiac Ablation Cardiac ablation is a procedure to destroy (ablate) some heart tissue that is sending bad signals. These bad signals causeproblems in heart  rhythm. The heart has many areas that make these signals. If there are problems in these areas, they can make the heart beat in a way that is not normal.Destroying some tissues can help make the heart rhythm normal. Tell your doctor about: Any allergies you have. All medicines you are taking. These include vitamins, herbs, eye drops, creams, and over-the-counter medicines. Any problems you or family members have had with medicines that make you fall asleep (anesthetics). Any blood disorders you have. Any surgeries you have had. Any medical conditions you have, such as kidney failure. Whether you are pregnant or may be pregnant. What are the risks? This is a safe procedure. But problems may occur, including: Infection. Bruising and bleeding. Bleeding into the chest. Stroke or blood clots. Damage to nearby areas of your body. Allergies to medicines or dyes. The need for a pacemaker if the normal system is damaged. Failure of the procedure to treat the problem. What happens before the procedure? Medicines Ask your doctor about: Changing or stopping your normal medicines. This is important. Taking aspirin and ibuprofen. Do not take these medicines unless your doctor tells you to take them. Taking other medicines, vitamins, herbs, and supplements. General instructions Follow instructions from your doctor about what you cannot eat or drink. Plan to have someone take you home from the hospital or clinic. If you will be going home right after the procedure, plan to have someone with you for 24 hours. Ask your doctor what steps will be taken to prevent infection. What happens during the procedure?  An IV tube will be put into one of your veins. You will be given a medicine to help you relax. The skin on your neck or groin will be numbed. A cut (incision) will be made in your neck or groin. A needle will be put through your cut and into a large vein. A tube (catheter) will be put into the  needle. The tube will be moved to your heart. Dye may be put through the tube. This helps your doctor see your heart. Small devices (electrodes) on the tube will send out signals. A type of energy will be used to destroy some heart tissue. The tube will be taken out. Pressure will be held on your cut. This helps stop bleeding. A bandage will be put over your cut. The exact procedure may vary among doctors and hospitals. What happens after the procedure? You will be watched until you leave the hospital or clinic. This includes checking your heart rate, breathing rate, oxygen, and blood pressure. Your cut will be watched for bleeding. You will need to lie still for a few hours. Do not drive for 24 hours or as long as your doctor tells you. Summary Cardiac ablation is a procedure to destroy some heart tissue. This is done to treat heart rhythm problems. Tell your doctor about any medical conditions you may have. Tell him or her about all medicines you are taking to treat them. This is a safe procedure. But problems may occur. These include infection, bruising, bleeding, and damage to nearby areas of your body. Follow what your doctor tells you about food and drink. You may also be told to change or stop some of your medicines. After the procedure, do not drive for 24 hours or as long as your doctor tells you. This information is not intended to replace advice given to you by your health care provider. Make sure you discuss any questions you have with your healthcare provider. Document Revised: 07/20/2019 Document Reviewed: 07/20/2019 Elsevier Patient Education  2022 Elsevier Inc.     Flecainide Tablets What is this medication? FLECAINIDE (FLEK a nide) prevents and treats a fast or irregular heartbeat (arrhythmia). It is often used to treat a type of arrhythmia known as AFib (atrial fibrillation). It works by slowing down overactive electric signals in the heart, which stabilizes your heart  rhythm. It belongs to a group ofmedications called antiarrhythmics. This medicine may be used for other purposes; ask your health care provider orpharmacist if you have questions. COMMON BRAND NAME(S): Tambocor What should I tell my care team before I take this medication? They need to know if you have any of these conditions: Abnormal levels of potassium in the blood Heart disease including heart rhythm and heart rate problems Kidney or liver disease Recent heart attack An unusual or allergic reaction to flecainide, local anesthetics, other medications, foods, dyes, or preservatives Pregnant or trying to get pregnant Breast-feeding How should I use this medication? Take this medication by mouth with a glass of water. Follow the directions on the prescription label. You can take this medication with or without food. Take your doses at regular intervals. Do not take your medication more often than directed. Do not stop taking this medication suddenly. This may cause serious, heart-related side effects. If your care team wants you to stop the medication,the dose may be slowly lowered over time to avoid any side effects. Talk to your care team regarding the use of this medication in children. While this medication may  be prescribed for children as young as 1 year of age forselected conditions, precautions do apply. Overdosage: If you think you have taken too much of this medicine contact apoison control center or emergency room at once. NOTE: This medicine is only for you. Do not share this medicine with others. What if I miss a dose? If you miss a dose, take it as soon as you can. If it is almost time for yournext dose, take only that dose. Do not take double or extra doses. What may interact with this medication? Do not take this medication with any of the following: Amoxapine Arsenic trioxide Certain antibiotics like clarithromycin, erythromycin, gatifloxacin, gemifloxacin, levofloxacin,  moxifloxacin, sparfloxacin, or troleandomycin Certain antidepressants called tricyclic antidepressants like amitriptyline, imipramine, or nortriptyline Certain medications to control heart rhythm like disopyramide, encainide, moricizine, procainamide, propafenone, and quinidine Cisapride Delavirdine Droperidol Haloperidol [place] Imatinib Levomethadyl Maprotiline Medications for malaria like chloroquine and halofantrine Pentamidine Phenothiazines like chlorpromazine, mesoridazine, prochlorperazine, thioridazine Pimozide Quinine Ranolazine Ritonavir Sertindole This medication may also interact with the following: Cimetidine Dofetilide Medications for angina or high blood pressure Medications to control heart rhythm like amiodarone and digoxin Ziprasidone This list may not describe all possible interactions. Give your health care provider a list of all the medicines, herbs, non-prescription drugs, or dietary supplements you use. Also tell them if you smoke, drink alcohol, or use illegaldrugs. Some items may interact with your medicine. What should I watch for while using this medication? Visit your care team for regular checks on your progress. Because your condition and the use of this medication carries some risk, it is a good idea to carry an identification card, necklace or bracelet with details of yourcondition, medications, and care team. Check your blood pressure and pulse rate regularly. Ask your care team what your blood pressure and pulse rate should be, and when you should contact them. Your care team also may schedule regular blood tests and electrocardiograms tocheck your progress. You may get drowsy or dizzy. Do not drive, use machinery, or do anything that needs mental alertness until you know how this medication affects you. Do not stand or sit up quickly, especially if you are an older patient. This reduces the risk of dizzy or fainting spells. Alcohol can make you more  dizzy, increaseflushing and rapid heartbeats. Avoid alcoholic drinks. What side effects may I notice from receiving this medication? Side effects that you should report to your care team as soon as possible: Allergic reactions-skin rash, itching, hives, swelling of the face, lips, tongue, or throat Heart failure-shortness of breath, swelling of the ankles, feet, or hands, sudden weight gain, unusual weakness or fatigue Heart rhythm changes-fast or irregular heartbeat, dizziness, feeling faint or lightheaded, chest pain, trouble breathing Liver injury-right upper belly pain, loss of appetite, nausea, light-colored stool, dark yellow or brown urine, yellowing skin or eyes, unusual weakness or fatigue Side effects that usually do not require medical attention (report to your careteam if they continue or are bothersome): Blurry vision Constipation Dizziness Fatigue Headache Nausea Tremors or shaking This list may not describe all possible side effects. Call your doctor for medical advice about side effects. You may report side effects to FDA at1-800-FDA-1088. Where should I keep my medication? Keep out of the reach of children and pets. Store at room temperature between 15 and 30 degrees C (59 and 86 degrees F). Protect from light. Keep container tightly closed. Throw away any unusedmedication after the expiration date. NOTE: This sheet is a summary. It  may not cover all possible information. If you have questions about this medicine, talk to your doctor, pharmacist, orhealth care provider.  2022 Elsevier/Gold Standard (2020-09-19 12:17:39)   Sleep Study, Adult A sleep study (polysomnogram) is a series of tests done while you are sleeping. A sleep study records your brain waves, heart rate, breathing rate, oxygen level, and eye and legmovements. A sleep study helps your health care provider: See how well you sleep. Diagnose a sleep disorder. Determine how severe your sleep disorder  is. Create a plan to treat your sleep disorder. Your health care provider may recommend a sleep study if you: Feel sleepy on most days. Snore loudly while sleeping. Have unusual behaviors while you sleep, such as walking. Have brief periods in which you stop breathing during sleep (sleepapnea). Fall asleep suddenly during the day (narcolepsy). Have trouble falling asleep or staying asleep (insomnia). Feel like you need to move your legs when trying to fall asleep (restless legs syndrome). Move your legs by flexing and extending them regularly while asleep (periodic limb movement disorder). Act out your dreams while you sleep (sleep behavior disorder). Feel like you cannot move when you first wake up (sleep paralysis). What tests are part of a sleep study? Most sleep studies record the following during sleep: Brain activity. Eye movements. Heart rate and rhythm. Breathing rate and rhythm. Blood-oxygen level. Blood pressure. Chest and belly movement as you breathe. Arm and leg movements. Snoring or other noises. Body position. Where are sleep studies done? Sleep studies are done at sleep centers. A sleep center may be inside [place], office, or clinic. The room where you have the study may look like a hospital room or a hotel room. The health care providers doing the study may come in and out of the room during the study. Most of the time, they will be in another room monitoringyour test as you sleep. How are sleep studies done? Most sleep studies are done during a normal period of time for a full night of sleep. You will arrive at the study center in the evening and go home in themorning. Before the test Bring your pajamas and toothbrush with you to the sleep study. Do not have caffeine on the day of your sleep study. Do not drink alcohol on the day of your sleep study. Your health care provider will let you know if you should stop taking any of your regular medicines before the  test. During the test     Round, sticky patches with sensors attached to recording wires (electrodes) are placed on your scalp, face, chest, and limbs. Wires from all the electrodes and sensors run from your bed to a computer. The wires can be taken off and put back on if you need to get out of bed to go to the bathroom. A sensor is placed over your nose to measure airflow. A finger clip is put on your finger or ear to measure your blood oxygen level (pulse oximetry). A belt is placed around your belly and a belt is placed around your chest to measure breathing movements. If you have signs of the sleep disorder called sleep apnea during your test, you may get a treatment mask to wear for the second half of the night. The mask provides positive airway pressure (PAP) to help you breathe better during sleep. This may greatly improve your sleep apnea. You will then have all tests done again with the mask in place to see if your measurements and recordings  change. After the test A medical doctor who specializes in sleep will evaluate the results of your sleep study and share them with you and your primary health care provider. Based on your results, your medical history, and a physical exam, you may be diagnosed with a sleep disorder, such as: Sleep apnea. Restless legs syndrome. Sleep-related behavior disorder. Sleep-related movement disorders. Sleep-related seizure disorders. Your health care team will help determine your treatment options based on your diagnosis. This may include: Improving your sleep habits (sleep hygiene). Wearing a continuous positive airway pressure (CPAP) or bi-level positive airway pressure (BPAP) mask. Wearing an oral device at night to improve breathing and reduce snoring. Taking medicines. Follow these instructions at home: Take over-the-counter and prescription medicines only as told by your health care provider. If you are instructed to use a CPAP or BPAP mask,  make sure you use it nightly as directed. Make any lifestyle changes that your health care provider recommends. If you were given a device to open your airway while you sleep, use it only as told by your health care provider. Do not use any tobacco products, such as cigarettes, chewing tobacco, and e-cigarettes. If you need help quitting, ask your health care provider. Keep all follow-up visits as told by your health care provider. This is important. Summary A sleep study (polysomnogram) is a series of tests done while you are sleeping. It shows how well you sleep. Most sleep studies are done over one full night of sleep. You will arrive at the study center in the evening and go home in the morning. If you have signs of the sleep disorder called sleep apnea during your test, you may get a treatment mask to wear for the second half of the night. A medical doctor who specializes in sleep will evaluate the results of your sleep study and share them with your primary health care provider. This information is not intended to replace advice given to you by your health care provider. Make sure you discuss any questions you have with your healthcare provider. Document Revised: 09/22/2019 Document Reviewed: 09/14/2017 Elsevier Patient Education  2022 [employer].

## 2021-02-25 ENCOUNTER — Encounter: Payer: Medicare HMO | Admitting: Pain Medicine

## 2021-02-26 LAB — TOXASSURE SELECT 13 (MW), URINE

## 2021-03-16 DIAGNOSIS — J019 Acute sinusitis, unspecified: Secondary | ICD-10-CM | POA: Diagnosis not present

## 2021-04-16 ENCOUNTER — Other Ambulatory Visit: Payer: Self-pay

## 2021-04-16 MED ORDER — PAIN MANAGEMENT IT PUMP REFILL: 1.0000 | Freq: Once | INTRATHECAL | 0 refills | Status: AC

## 2021-04-24 ENCOUNTER — Other Ambulatory Visit: Payer: Self-pay

## 2021-04-24 ENCOUNTER — Encounter: Payer: Self-pay | Admitting: Cardiology

## 2021-04-24 ENCOUNTER — Encounter: Payer: Self-pay | Admitting: *Deleted

## 2021-04-24 ENCOUNTER — Ambulatory Visit: Payer: Medicare HMO | Admitting: Cardiology

## 2021-04-24 VITALS — BP 110/68 | HR 64 | Ht 63.0 in | Wt 178.2 lb

## 2021-04-24 DIAGNOSIS — I480 Paroxysmal atrial fibrillation: Secondary | ICD-10-CM | POA: Diagnosis not present

## 2021-04-24 DIAGNOSIS — I4891 Unspecified atrial fibrillation: Secondary | ICD-10-CM | POA: Diagnosis not present

## 2021-04-24 NOTE — Patient Instructions (Addendum)
Medication Instructions: NO CHANGE If you need a refill on your cardiac medications before your next appointment, please call your pharmacy*   Lab Work: BMET AND CBC 2 WEEKS PRIOR TO PROCEDURE If you have labs (blood work) drawn today and your tests are completely normal, you will receive your results only by: MyChart Message (if you have MyChart) OR A paper copy in the mail If you have any lab test that is abnormal or we need to change your treatment, we will call you to review the results.   Testing/Procedures: NONE   Follow-Up: At [facility], you and your health needs are our priority.  As part of our continuing mission to provide you with exceptional heart care, we have created designated Provider Care Teams.  These Care Teams include your primary Cardiologist (physician) and Advanced Practice Providers (APPs -  Physician Assistants and Nurse Practitioners) who all work together to provide you with the care you need, when you need it.  We recommend signing up for the patient portal called "MyChart".  Sign up information is provided on this After Visit Summary.  MyChart is used to connect with patients for Virtual Visits (Telemedicine).  Patients are able to view lab/test results, encounter notes, upcoming appointments, etc.  Non-urgent messages can be sent to your provider as well.   To learn more about what you can do with MyChart, go to [email or web address].    Your next appointment:   1 month(s) AND 3 MONTHS WITH DR Elberta Fortis  AFTER 06/13/21 ABALTION The format for your next appointment:   In Person  Provider:   You will follow up in the Atrial Fibrillation Clinic located at [facility]. Your provider will be: Rudi Coco, NP   Other Instructions Your physician has recommended that you have an ablation. Catheter ablation is a medical procedure used to treat some cardiac arrhythmias (irregular heartbeats). During catheter ablation, a long, thin, flexible tube  is put into a blood vessel in your groin (upper thigh), or neck. This tube is called an ablation catheter. It is then guided to your heart through the blood vessel. Radio frequency waves destroy small areas of heart tissue where abnormal heartbeats may cause an arrhythmia to start. Please see the instruction sheet given to you today.

## 2021-04-24 NOTE — Progress Notes (Signed)
Electrophysiology Office Note   Date:  04/24/2021   ID:  Kendra Nelson, DOB 1960-12-01, MRN [id number]  PCP:  Iona Hansen, NP  Cardiologist:  Jacques Navy Primary Electrophysiologist:  Aries Townley [name], MD   Chief Complaint: AF   History of Present Illness: Kendra Nelson is a 60 y.o. female who is being seen today for the evaluation of AF at the request of Iona Hansen, NP. Presenting today for electrophysiology evaluation.  She has a history significant for hypertension, hyperlipidemia, chronic back pain status post spinal cord stimulator, and atrial fibrillation replacement April 2020 that she did presented to PACU a narrow complex tachycardia heart rates 200 wide-complex tachycardia.  ECG showed rapid atrial fibrillation and she was started on amiodarone.  She converted to sinus rhythm.  She wore a subsequent 30-day monitor that showed both atrial fibrillation and atrial flutter as well as wide-complex tachycardia during atrial fibrillation.  Today, denies symptoms of palpitations, chest pain, shortness of breath, orthopnea, PND, lower extremity edema, claudication, dizziness, presyncope, syncope, bleeding, or neurologic sequela. The patient is tolerating medications without difficulties.  She continues to have episodic palpitations.  There are days that she feels well, and other days that she has significant palpitations and fatigue.  She does have PVCs on her cardiac monitor, though based on her symptoms, it seems that her symptoms are more consistent with atrial fibrillation.  Past Medical History:  Diagnosis Date   Anxiety    off of valium- stress   Arthritis    left knee- rhumatoid- hasnt seen specialist    Chronic lower back pain    Complication of anesthesia    WOKE DURING BACK SURGERY ; urinary retention 12/20/2017   Hyperlipidemia    Hypertension    Migraine    "when seasons change" (12/20/2017)   Neuromuscular disorder (HCC)    Restless leg syndrome     Seasonal allergies    Skin abnormalities    Wears glasses    Past Surgical History:  Procedure Laterality Date   BACK SURGERY     8-9 SURGERIES; lower back   COLONOSCOPY     INTRATHECAL PUMP REVISION N/A 05/09/2019   Procedure: Intrathecal pump change;  Surgeon: Odette Fraction, MD;  Location: [facility] OR;  Service: Neurosurgery;  Laterality: N/A;  Intrathecal pump change   JOINT REPLACEMENT     PAIN PUMP IMPLANTATION  05/10/2012   Procedure: PAIN PUMP INSERTION;  Surgeon: Maeola Harman, MD;  Location: [facility];  Service: Neurosurgery;  Laterality: N/A;  Pump replacement   SPINAL CORD STIMULATOR BATTERY EXCHANGE N/A 06/09/2016   Procedure: IMPLANTABLE PULSE GENERATOR REPLACEMENT WITH RECHARGEABLE BATTERY;  Surgeon: Maeola Harman, MD;  Location[facility] OR;  Service: Neurosurgery;  Laterality: N/A;   TOTAL KNEE ARTHROPLASTY Left 12/20/2017   TOTAL KNEE ARTHROPLASTY Left 12/20/2017   Procedure: TOTAL KNEE ARTHROPLASTY;  Surgeon: Gean Birchwood, MD;  Location: [facility] OR;  Service: Orthopedics;  Laterality: Left;   TOTAL KNEE REVISION Left 12/19/2020   Procedure: TOTAL KNEE REVISION/ Total Knee Polynethylene Barring, Attune Total Knee;  Surgeon: Gean Birchwood, MD;  Location: [facility] ORS;  Service: Orthopedics;  Laterality: Left;   VAGINAL HYSTERECTOMY       Current Outpatient Medications  Medication Sig Dispense Refill   AMBULATORY NON FORMULARY MEDICATION Medication Name:  Intrathecal pump Fentanyl 1,000.0 mcg/ml Bupivacaine 20.0 mg/ml 40 ml pump Rate 357.2 mcg/day     apixaban (ELIQUIS) 5 MG TABS tablet Take 1 tablet (5 mg total) by mouth 2 (two)  times daily. 60 tablet 11   atorvastatin (LIPITOR) 20 MG tablet Take 20 mg by mouth every morning.      lisinopril (ZESTRIL) 20 MG tablet Take 2 tablets (40 mg total) by mouth every morning. 30 tablet 0   metoprolol succinate (TOPROL-XL) 25 MG 24 hr tablet Take 2 tablets (50 mg total) by mouth daily. 180 tablet 3   morphine (MS CONTIN) 30 MG 12 hr tablet Take 1  tablet (30 mg total) by mouth every 12 (twelve) hours. Must last 30 days. Do not break tablet 60 tablet 0   rOPINIRole (REQUIP) 0.5 MG tablet Take 1 mg by mouth at bedtime. Restless legs     morphine (MS CONTIN) 30 MG 12 hr tablet Take 1 tablet (30 mg total) by mouth every 12 (twelve) hours. Must last 30 days. Do not break tablet 60 tablet 0   morphine (MS CONTIN) 30 MG 12 hr tablet Take 1 tablet (30 mg total) by mouth every 12 (twelve) hours. Must last 30 days. Do not break tablet 60 tablet 0   No current facility-administered medications for this visit.    Allergies:   Benadryl [diphenhydramine hcl], Bee venom, and Dexamethasone   Social History:  The patient  reports that she quit smoking about 2 years ago. Her smoking use included cigarettes. She has a 30.00 pack-year smoking history. She has never used smokeless tobacco. She reports current drug use. Drug: Oxycodone. She reports that she does not drink alcohol.   Family History:  The patient's family history includes Aortic aneurysm in her mother; Diabetes in her maternal grandmother.   ROS:  Please see the history of present illness.   Otherwise, review of systems is positive for none.   All other systems are reviewed and negative.   PHYSICAL EXAM: VS:  BP 110/68   Pulse 64   Ht 5\' 3"  (1.6 m)   Wt 178 lb 3.2 oz (80.8 kg)   SpO2 96%   BMI 31.57 kg/m  , BMI Body mass index is 31.57 kg/m. GEN: Well nourished, well developed, in no acute distress  HEENT: normal  Neck: no JVD, carotid bruits, or masses Cardiac: Irregular; no murmurs, rubs, or gallops,no edema  Respiratory:  clear to auscultation bilaterally, normal work of breathing GI: soft, nontender, nondistended, + BS MS: no deformity or atrophy  Skin: warm and dry Neuro:  Strength and sensation are intact Psych: euthymic mood, full affect  EKG:  EKG is ordered today. Personal review of the ekg ordered shows sinus rhythm, ventricular bigeminy  Recent Labs: 12/19/2020: ALT  26; TSH 0.705 12/20/2020: BUN 16; Creatinine, Ser 0.69; Hemoglobin 14.6; Magnesium 2.1; Platelets 210; Potassium 3.7; Sodium 139    Lipid Panel  No results found for: CHOL, TRIG, HDL, CHOLHDL, VLDL, LDLCALC, LDLDIRECT   Wt Readings from Last 3 Encounters:  04/24/21 178 lb 3.2 oz (80.8 kg)  02/21/21 177 lb (80.3 kg)  02/18/21 165 lb (74.8 kg)      Other studies Reviewed: Additional studies/ records that were reviewed today include: TTE 12/20/20  Review of the above records today demonstrates:   1. Left ventricular ejection fraction, by estimation, is 60 to 65%. The  left ventricle has normal function. The left ventricle has no regional  wall motion abnormalities. Left ventricular diastolic parameters are  consistent with Grade II diastolic  dysfunction (pseudonormalization).   2. Right ventricular systolic function is normal. The right ventricular  size is normal.   3. Left atrial size was moderately dilated.  4. The mitral valve is normal in structure. Trivial mitral valve  regurgitation. No evidence of mitral stenosis.   5. The aortic valve is normal in structure. Aortic valve regurgitation is  not visualized. No aortic stenosis is present.   6. The inferior vena cava is dilated in size with >50% respiratory  variability, suggesting right atrial pressure of 8 mmHg.   Myoview 01/17/21 The left ventricular ejection fraction is normal (55-65%). Nuclear stress EF: 55%. No T wave inversion was noted during stress. There was no ST segment deviation noted during stress. This is a low risk study.  ASSESSMENT AND PLAN:  1.  Paroxysmal atrial fibrillation/flutter: Noted on cardiac monitors.  Currently on Toprol-XL and Eliquis.  CHA2DS2-VASc of 2.  She is continue to have episodes of atrial fibrillation and feels poorly.  She would prefer to avoid medications.  At that time we Deondrea Markos plan for ablation.  Risks and benefits were discussed.  She understands his risks and has agreed to the  procedure.  Risk, benefits, and alternatives to EP study and radiofrequency ablation for afib were also discussed in detail today. These risks include but are not limited to stroke, bleeding, vascular damage, tamponade, perforation, damage to the esophagus, lungs, and other structures, pulmonary vein stenosis, worsening renal function, and death. The patient understands these risk and wishes to proceed.  We Arnesia Vincelette therefore proceed with catheter ablation at the next available time.  Carto, ICE, anesthesia are requested for the procedure.  Earnest Thalman also obtain CT PV protocol prior to the procedure to exclude LAA thrombus and further evaluate atrial anatomy.   2.  Hypertension: Currently well controlled  3.  Hyperlipidemia: Continue atorvastatin per primary cardiology.  4.  PVCs: Had an 18% burden on her cardiac monitor.  She is in ventricular bigeminy today.  Fortunately her ejection fraction has remained stable.  We Berdine Rasmusson continue to monitor.  Case discussed with primary cardiology  Current medicines are reviewed at length with the patient today.   The patient does not have concerns regarding her medicines.  The following changes were made today: None  Labs/ tests ordered today include:  Orders Placed This Encounter  Procedures   EKG 12-Lead     Disposition:   FU with Donnarae Rae 3 months  Signed, Alayssa Flinchum [name], MD  04/24/2021 10:07 AM     [facility] [address] [place] [address] [phone] (office) [phone] (fax)

## 2021-04-24 NOTE — Addendum Note (Signed)
Addended by: Scherrie Bateman E on: 04/24/2021 10:45 AM   Modules accepted: Orders

## 2021-05-15 ENCOUNTER — Ambulatory Visit: Payer: Medicare HMO | Attending: Pain Medicine | Admitting: Pain Medicine

## 2021-05-15 ENCOUNTER — Encounter: Payer: Self-pay | Admitting: Pain Medicine

## 2021-05-15 ENCOUNTER — Other Ambulatory Visit: Payer: Self-pay

## 2021-05-15 VITALS — BP 186/101 | HR 63 | Temp 97.3°F | Resp 16 | Ht 63.0 in | Wt 165.0 lb

## 2021-05-15 DIAGNOSIS — M79604 Pain in right leg: Secondary | ICD-10-CM | POA: Insufficient documentation

## 2021-05-15 DIAGNOSIS — M5441 Lumbago with sciatica, right side: Secondary | ICD-10-CM | POA: Diagnosis not present

## 2021-05-15 DIAGNOSIS — M5136 Other intervertebral disc degeneration, lumbar region: Secondary | ICD-10-CM | POA: Insufficient documentation

## 2021-05-15 DIAGNOSIS — F1190 Opioid use, unspecified, uncomplicated: Secondary | ICD-10-CM

## 2021-05-15 DIAGNOSIS — Z888 Allergy status to other drugs, medicaments and biological substances status: Secondary | ICD-10-CM | POA: Insufficient documentation

## 2021-05-15 DIAGNOSIS — Z79899 Other long term (current) drug therapy: Secondary | ICD-10-CM | POA: Diagnosis not present

## 2021-05-15 DIAGNOSIS — M5442 Lumbago with sciatica, left side: Secondary | ICD-10-CM | POA: Insufficient documentation

## 2021-05-15 DIAGNOSIS — Z79891 Long term (current) use of opiate analgesic: Secondary | ICD-10-CM | POA: Diagnosis not present

## 2021-05-15 DIAGNOSIS — M961 Postlaminectomy syndrome, not elsewhere classified: Secondary | ICD-10-CM

## 2021-05-15 DIAGNOSIS — M79605 Pain in left leg: Secondary | ICD-10-CM | POA: Insufficient documentation

## 2021-05-15 DIAGNOSIS — G8929 Other chronic pain: Secondary | ICD-10-CM

## 2021-05-15 DIAGNOSIS — F1120 Opioid dependence, uncomplicated: Secondary | ICD-10-CM

## 2021-05-15 DIAGNOSIS — G894 Chronic pain syndrome: Secondary | ICD-10-CM | POA: Diagnosis not present

## 2021-05-15 DIAGNOSIS — Z978 Presence of other specified devices: Secondary | ICD-10-CM | POA: Diagnosis not present

## 2021-05-15 DIAGNOSIS — M51369 Other intervertebral disc degeneration, lumbar region without mention of lumbar back pain or lower extremity pain: Secondary | ICD-10-CM

## 2021-05-15 DIAGNOSIS — M5417 Radiculopathy, lumbosacral region: Secondary | ICD-10-CM | POA: Diagnosis not present

## 2021-05-15 DIAGNOSIS — Z451 Encounter for adjustment and management of infusion pump: Secondary | ICD-10-CM

## 2021-05-15 MED ORDER — MORPHINE SULFATE ER 30 MG PO TBCR: 30.0000 mg | EXTENDED_RELEASE_TABLET | Freq: Two times a day (BID) | ORAL | 0 refills | Status: DC

## 2021-05-15 MED ORDER — NALOXONE HCL 4 MG/0.1ML NA LIQD: 1.0000 | NASAL | 0 refills | Status: AC | PRN

## 2021-05-15 NOTE — Progress Notes (Signed)
Nursing Pain Medication Assessment:  Safety precautions to be maintained throughout the outpatient stay will include: orient to surroundings, keep bed in low position, maintain call bell within reach at all times, provide assistance with transfer out of bed and ambulation.  Medication Inspection Compliance: Pill count conducted under aseptic conditions, in front of the patient. Neither the pills nor the bottle was removed from the patient's sight at any time. Once count was completed pills were immediately returned to the patient in their original bottle.  Medication: Morphine ER (MSContin) Pill/Patch Count:  9 of 60 pills remain Pill/Patch Appearance: Markings consistent with prescribed medication Bottle Appearance: Standard pharmacy container. Clearly labeled. Filled Date: 08 / 21 / 2022 Last Medication intake:  Today

## 2021-05-15 NOTE — Progress Notes (Signed)
PROVIDER NOTE: Information contained herein reflects review and annotations entered in association with encounter. Interpretation of such information and data should be left to medically-trained personnel. Information provided to patient can be located elsewhere in the medical record under "Patient Instructions". Document created using STT-dictation technology, any transcriptional errors that may result from process are unintentional.    Patient: Kendra Nelson  Service Category: Procedure  Provider: Gaspar Cola, MD  DOB: 1960/12/21  DOS: 05/15/2021  Location: [facility]  MRN: [id number]  Setting: Ambulatory - outpatient  Referring Provider: Berkley Harvey, NP  Type: Established Patient  Specialty: Interventional Pain Management  PCP: Kendra Harvey, NP   Primary Reason for Visit: Interventional Pain Management Treatment. CC: Back Pain (low)   Procedure:          Type: Management of infusion pump - Reservoir Refill [phone]). No rate change.    Indications: 1. 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 Chronic low back pain (Primary Area of Pain) (Bilateral) (R>L)   4. Chronic lower extremity pain (Secondary Area of Pain) (Bilateral) (R>L)   5. DDD (degenerative disc disease), lumbar   6. Chronic lumbosacral radiculopathy (L5) (Left)   7. Presence of intrathecal pump   8. Adjustment and management of infusion pump   9. Pharmacologic therapy   10. Uncomplicated opioid dependence (Kendra Nelson)   11. Opiate use (93.16 MME/day)   12. Long term prescription opiate use    Pain Assessment: Self-Reported Pain Score: 2 /10             Reported level is compatible with observation.         Procedure:           Intrathecal Drug Delivery System (IDDS):  Type: Reservoir Refill [phone])       Region: Abdominal Laterality: Left   Type of Pump: Medtronic Synchromed II Delivery Route: Intrathecal Type of Pain Treated:  Neuropathic/Nociceptive Primary Medication Class: Opioid/opiate  Medication, Concentration, Infusion Program, & Delivery Rate: Please see scanned programming printout.     Pharmacotherapy Assessment   Analgesic: Morphine (MS Contin) 30 mg PO BID (60 mg/day of morphine) (60 MME/day) +  PF-(intrathecal)-Fentanyl (13.4 mcg/hr)  changed from Oxycodone IR 10 mg 4x/day (40 mg/day of oxycodone) (60 MME/day), due to national shortage MME/day: 60 MME/day (oral) + 32.16 MME/day (intrathecal) = 93.16 MME/day (Aprox. 1.16 MME/day/kg).   Monitoring: Kendra Nelson PMP: PDMP reviewed during this encounter.       Pharmacotherapy: No side-effects or adverse reactions reported. Compliance: No problems identified. Effectiveness: Clinically acceptable. Plan: Refer to "POC". UDS:  Summary  Date Value Ref Range Status  02/18/2021 Note  Final    Comment:    ==================================================================== [employer] Select 13 (MW) ==================================================================== Test                             Result       Flag       Units  Drug Present and Declared for Prescription Verification   Morphine                       >4000        EXPECTED   ng/mg creat   Normorphine                    405  EXPECTED   ng/mg creat    Potential sources of large amounts of morphine in the absence of    codeine include administration of morphine or use of heroin.     Normorphine is an expected metabolite of morphine.    Hydromorphone                  52           EXPECTED   ng/mg creat    Hydromorphone may be present as a metabolite of morphine;    concentrations of hydromorphone rarely exceed 5% of the morphine    concentration when this is the source of hydromorphone.    Fentanyl                       27           EXPECTED   ng/mg creat   Norfentanyl                    59           EXPECTED   ng/mg creat    Source of fentanyl is a scheduled prescription medication,  including    IV, patch, and transmucosal formulations. Norfentanyl is an expected    metabolite of fentanyl.  ==================================================================== Test                      Result    Flag   Units      Ref Range   Creatinine              250              mg/dL      >=20 ==================================================================== Declared Medications:  The flagging and interpretation on this report are based on the  following declared medications.  Unexpected results may arise from  inaccuracies in the declared medications.   **Note: The testing scope of this panel includes these medications:   Fentanyl  Morphine (MS Contin)   **Note: The testing scope of this panel does not include the  following reported medications:   Apixaban (Eliquis)  Atorvastatin (Lipitor)  Bupivacaine  Lisinopril (Zestril)  Metoprolol (Toprol)  Ropinirole (Requip)  Tizanidine (Zanaflex) ==================================================================== For clinical consultation, please call [phone]. ====================================================================         Intrathecal Pump Therapy Assessment  Manufacturer: Medtronic Synchromed Type: Programmable Volume: 40 mL reservoir MRI compatibility: Yes    Drug content:  Primary Medication Class: Opioid Primary Medication: PF-Fentanyl (1,000 mcg/mL)  Secondary Medication: PF-Bupivacaine (20.0 mg/mL)  Other Medication: see pump readout   Programming:  Type: Simple continuous. See pump readout for details.    Changes:  Medication Change: None at this point Rate Change: No change in rate  Reported side-effects or adverse reactions: None reported  Effectiveness: Described as relatively effective, allowing for increase in activities of daily living (ADL) Clinically meaningful improvement in function (CMIF): Sustained CMIF goals met  Plan: Pump refill today   Pre-op H&P Assessment:   Kendra Nelson is a 60 y.o. (year old), female patient, seen today for interventional treatment. 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 Ms. Nass has a current medication list which includes the following prescription(s): AMBULATORY  NON FORMULARY MEDICATION, eliquis, atorvastatin, lisinopril, metoprolol succinate, [START ON 05/19/2021] morphine, [START ON 06/18/2021] morphine, [START ON 07/18/2021] morphine, naloxone, and ropinirole. Her primarily concern today is the Back Pain (low)  Initial Vital Signs:  Pulse/HCG Rate: 63  Temp: (!) 97.3 F (36.3 C) Resp: 16 BP: (!) 144/128 SpO2: 98 %  BMI: Estimated body mass index is 29.23 kg/m as calculated from the following:   Height as of this encounter: $RemoveBeforeD'5\' 3"'nMWZLAOaOvFSEt$  (1.6 m).   Weight as of this encounter: 165 lb (74.8 kg).  Risk Assessment: Allergies: Reviewed. She is allergic to benadryl [diphenhydramine hcl], bee venom, and dexamethasone.  Allergy Precautions: None required Coagulopathies: Reviewed. None identified.  Blood-thinner therapy: None at this time Active Infection(s): Reviewed. None identified. Ms. Bonilla is afebrile  Site Confirmation: Ms. Dosch was asked to confirm the procedure and laterality before marking the site Procedure checklist: Completed Consent: Before the procedure and under the influence of no sedative(s), amnesic(s), or anxiolytics, the patient was informed of the treatment options, risks and possible complications. To fulfill our ethical and legal obligations, as recommended by the American Medical Association's Code of Ethics, I have informed the patient of my clinical  impression; the nature and purpose of the treatment or procedure; the risks, benefits, and possible complications of the intervention; the alternatives, including doing nothing; the risk(s) and benefit(s) of the alternative treatment(s) or procedure(s); and the risk(s) and benefit(s) of doing nothing.  Ms. Hickam was provided with information about the general risks and possible complications associated with most interventional procedures. These include, but are not limited to: failure to achieve desired goals, infection, bleeding, organ or nerve damage, allergic reactions, paralysis, and/or death.  In addition, she was informed of those risks and possible complications associated to this particular procedure, which include, but are not limited to: damage to the implant; failure to decrease pain; local, systemic, or serious CNS infections, intraspinal abscess with possible cord compression and paralysis, or life-threatening such as meningitis; bleeding; organ damage; nerve injury or damage with subsequent sensory, motor, and/or autonomic system dysfunction, resulting in transient or permanent pain, numbness, and/or weakness of one or several areas of the body; allergic reactions, either minor or major life-threatening, such as anaphylactic or anaphylactoid reactions.  Furthermore, Ms. Coxe was informed of those risks and complications associated with the medications. These include, but are not limited to: allergic reactions (i.e.: anaphylactic or anaphylactoid reactions); endorphine suppression; bradycardia and/or hypotension; water retention and/or peripheral vascular relaxation leading to lower extremity edema and possible stasis ulcers; respiratory depression and/or shortness of breath; decreased metabolic rate leading to weight gain; swelling or edema; medication-induced neural toxicity; particulate matter embolism and blood vessel occlusion with resultant organ, and/or nervous system infarction;  and/or intrathecal granuloma formation with possible spinal cord compression and permanent paralysis.  Before refilling the pump Ms. Acocella was informed that some of the medications used in the devise may not be FDA approved for such use and therefore it constitutes an off-label use of the medications.  Finally, she was informed that Medicine is not an exact science; therefore, there is also the possibility of unforeseen or unpredictable risks and/or possible complications that may result in a catastrophic outcome. The patient indicated having understood very clearly. We have given the patient no guarantees and we have made no promises. Enough time was given to the patient to ask questions, all of which were answered to the patient's satisfaction. Ms. Horrell has indicated that she wanted to continue with the procedure. Attestation:  I, the ordering provider, attest that I have discussed with the patient the benefits, risks, side-effects, alternatives, likelihood of achieving goals, and potential problems during recovery for the procedure that I have provided informed consent. Date  Time: 05/15/2021  1:15 PM  Pre-Procedure Preparation:  Monitoring: As per clinic protocol. Respiration, ETCO2, SpO2, BP, heart rate and rhythm monitor placed and checked for adequate function Safety Precautions: Patient was assessed for positional comfort and pressure points before starting the procedure. Time-out: I initiated and conducted the "Time-out" before starting the procedure, as per protocol. The patient was asked to participate by confirming the accuracy of the "Time Out" information. Verification of the correct person, site, and procedure were performed and confirmed by me, the nursing staff, and the patient. "Time-out" conducted as per Joint Commission's Universal Protocol (UP.01.01.01). Time: 1321  Description of Procedure:          Position: Supine Target Area: Central-port of intrathecal  pump. Approach: Anterior, 90 degree angle approach. Area Prepped: Entire Area around the pump implant. DuraPrep (Iodine Povacrylex [0.7% available iodine] and Isopropyl Alcohol, 74% w/w) Safety Precautions: Aspiration looking for blood return was conducted prior to all injections. At no point did we inject any substances, as a needle was being advanced. No attempts were made at seeking any paresthesias. Safe injection practices and needle disposal techniques used. Medications properly checked for expiration dates. SDV (single dose vial) medications used. Description of the Procedure: Protocol guidelines were followed. Two nurses trained to do implant refills were present during the entire procedure. The refill medication was checked by both healthcare providers as well as the patient. The patient was included in the "Time-out" to verify the medication. The patient was placed in position. The pump was identified. The area was prepped in the usual manner. The sterile template was positioned over the pump, making sure the side-port location matched that of the pump. Both, the pump and the template were held for stability. The needle provided in the Medtronic Kit was then introduced thru the center of the template and into the central port. The pump content was aspirated and discarded volume documented. The new medication was slowly infused into the pump, thru the filter, making sure to avoid overpressure of the device. The needle was then removed and the area cleansed, making sure to leave some of the prepping solution back to take advantage of its long term bactericidal properties. The pump was interrogated and programmed to reflect the correct medication, volume, and dosage. The program was printed and taken to the physician for approval. Once checked and signed by the physician, a copy was provided to the patient and another scanned into the EMR. Vitals:   05/15/21 1313 05/15/21 1315  BP: (!) 144/128 (!)  186/101  Pulse: 63   Resp: 16   Temp: (!) 97.3 F (36.3 C)   SpO2: 98%   Weight: 165 lb (74.8 kg)   Height: $Remove'5\' 3"'lPbllvM$  (1.6 m)     Start Time: 1321 hrs. End Time: 1325 hrs. Materials & Medications: Medtronic Refill Kit Medication(s): Please see chart orders for details.  Imaging Guidance:          Type of Imaging Technique: None used Indication(s): N/A Exposure Time: No patient exposure Contrast: None used. Fluoroscopic Guidance: N/A Ultrasound Guidance: N/A Interpretation: N/A  Antibiotic Prophylaxis:   Anti-infectives (From admission, onward)    None      Indication(s): None identified  Post-operative Assessment:  Post-procedure Vital Signs:  Pulse/HCG Rate: 63  Temp: Marland Kitchen)  97.3 F (36.3 C) Resp: 16 BP: (!) 186/101 SpO2: 98 %  EBL: None  Complications: No immediate post-treatment complications observed by team, or reported by patient.  Note: The patient tolerated the entire procedure well. A repeat set of vitals were taken after the procedure and the patient was kept under observation following institutional policy, for this type of procedure. Post-procedural neurological assessment was performed, showing return to baseline, prior to discharge. The patient was provided with post-procedure discharge instructions, including a section on how to identify potential problems. Should any problems arise concerning this procedure, the patient was given instructions to immediately contact us, at any time, without hesitation. In any case, we plan to contact the patient by telephone for a follow-up status report regarding this interventional procedure.  Comments:  No additional relevant information.  Plan of Care  Orders:  Orders Placed This Encounter  Procedures   PUMP REFILL    Maintain Protocol by having two(2) healthcare providers during procedure and programming.    Scheduling Instructions:     Please refill intrathecal pump today.    Order Specific Question:   Where  will this procedure be performed?    Answer:   [facility]   PUMP REFILL    Whenever possible schedule on a procedure today.    Standing Status:   Future    Standing Expiration Date:   09/14/2021    Scheduling Instructions:     Please schedule intrathecal pump refill based on pump programming. Avoid schedule intervals of more than 120 days (4 months).    Order Specific Question:   Where will this procedure be performed?    Answer:   [facility]   Informed Consent Details: Physician/Practitioner Attestation; Transcribe to consent form and obtain patient signature    Transcribe to consent form and obtain patient signature.    Order Specific Question:   Physician/Practitioner attestation of informed consent for procedure/surgical case    Answer:   I, the physician/practitioner, attest that I have discussed with the patient the benefits, risks, side effects, alternatives, likelihood of achieving goals and potential problems during recovery for the procedure that I have provided informed consent.    Order Specific Question:   Procedure    Answer:   Intrathecal pump refill    Order Specific Question:   Physician/Practitioner performing the procedure    Answer:   Attending Physician: Kathlen Brunswick. Dossie Arbour, MD & designated trained staff    Order Specific Question:   Indication/Reason    Answer:   Chronic Pain Syndrome (G89.4), presence of an intrathecal pump (Z97.8)    Chronic Opioid Analgesic:  Morphine (MS Contin) 30 mg PO BID (60 mg/day of morphine) (60 MME/day) +  PF-(intrathecal)-Fentanyl (13.4 mcg/hr)  changed from Oxycodone IR 10 mg 4x/day (40 mg/day of oxycodone) (60 MME/day), due to national shortage MME/day: 60 MME/day (oral) + 32.16 MME/day (intrathecal) = 93.16 MME/day (Aprox. 1.16 MME/day/kg).   Medications ordered for procedure: Meds ordered this encounter  Medications   morphine (MS CONTIN) 30 MG 12 hr tablet    Sig: Take 1 tablet (30 mg total) by mouth every 12  (twelve) hours. Must last 30 days. Do not break tablet    Dispense:  60 tablet    Refill:  0    Not a duplicate. Do NOT delete! Dispense 1 day early if closed on refill date. Avoid benzodiazepines within 8 hours of opioids. Do not send refill requests.   morphine (MS CONTIN) 30 MG 12 hr  tablet    Sig: Take 1 tablet (30 mg total) by mouth every 12 (twelve) hours. Must last 30 days. Do not break tablet    Dispense:  60 tablet    Refill:  0    Not a duplicate. Do NOT delete! Dispense 1 day early if closed on refill date. Avoid benzodiazepines within 8 hours of opioids. Do not send refill requests.   morphine (MS CONTIN) 30 MG 12 hr tablet    Sig: Take 1 tablet (30 mg total) by mouth every 12 (twelve) hours. Must last 30 days. Do not break tablet    Dispense:  60 tablet    Refill:  0    Not a duplicate. Do NOT delete! Dispense 1 day early if closed on refill date. Avoid benzodiazepines within 8 hours of opioids. Do not send refill requests.   naloxone (NARCAN) nasal spray 4 mg/0.1 mL    Sig: Place 1 spray into the nose as needed for up to 365 doses (for opioid-induced respiratory depresssion). In case of emergency (overdose), spray once into each nostril. If no response within 3 minutes, repeat application and call [phone].    Dispense:  1 each    Refill:  0    Instruct patient in proper use of device.    Medications administered: Hassan Rowan A. Lenhard had no medications administered during this visit.  See the medical record for exact dosing, route, and time of administration.  Follow-up plan:   Return for Pump Refill (Max:75mo).       Interventional Therapies  Risk  Complexity Considerations:   Estimated body mass index is 29.23 kg/m as calculated from the following:   Height as of this encounter: $RemoveBeforeD'5\' 3"'RcgFASAOutWIgg$  (1.6 m).   Weight as of this encounter: 165 lb (74.8 kg). ELIQUIS Anticoagulation (Stop: 3 days  Restart: 6 hours)   Planned  Pending:   Intrathecal pump refill as per pump programming.    Under consideration:   Diagnostic left IA knee joint inj  Diagnostic bilateral L2 TFESI  Diagnostic left genicular NB  Possible left genicular nerve RFA  Diagnostic caudal ESI + epidurogram  Possible Racz procedure  Diagnostic bilateral lumbar facet block  Possible bilateral lumbar facet RFA  (unlikely due to extensive lumbar fusion from L3-S1)    Completed:   None at this time   Therapeutic  Palliative (PRN) options:   Therapeutic/palliative intrathecal pump  management (analysis and refill)      Recent Visits Date Type Provider Dept  05/15/21 Procedure visit Milinda Pointer, MD [facility]  02/18/21 Procedure visit Milinda Pointer, MD [facility]  Showing recent visits within past 90 days and meeting all other requirements Future Appointments Date Type Provider Dept  08/05/21 Appointment Milinda Pointer, MD [facility]  Showing future appointments within next 90 days and meeting all other requirements Disposition: Discharge home  Discharge (Date  Time): 05/15/2021; 1334 hrs.   Primary Care Physician: Kendra Harvey, NP Location: [facility] Note by: Kendra Cola, MD Date: 05/15/2021; Time: 10:45 AM  Disclaimer:  Medicine is not an Chief Strategy Officer. The only guarantee in medicine is that nothing is guaranteed. It is important to note that the decision to proceed with this intervention was based on the information collected from the patient. The Data and conclusions were drawn from the patient's questionnaire, the interview, and the physical examination. Because the information was provided in large part by the patient, it cannot be guaranteed that it has  not been purposely or unconsciously manipulated. Every effort has been made to obtain as much relevant data as possible for this evaluation. It is important to note that the conclusions that lead to this procedure are derived in large part from the  available data. Always take into account that the treatment will also be dependent on availability of resources and existing treatment guidelines, considered by other Pain Management Practitioners as being common knowledge and practice, at the time of the intervention. For Medico-Legal purposes, it is also important to point out that variation in procedural techniques and pharmacological choices are the acceptable norm. The indications, contraindications, technique, and results of the above procedure should only be interpreted and judged by a Board-Certified Interventional Pain Specialist with extensive familiarity and expertise in the same exact procedure and technique.

## 2021-05-15 NOTE — Patient Instructions (Addendum)
____________________________________________________________________________________________  Medication Rules  Purpose: To inform patients, and their family members, of our rules and regulations.  Applies to: All patients receiving prescriptions (written or electronic).  Pharmacy of record: Pharmacy where electronic prescriptions will be sent. If written prescriptions are taken to a different pharmacy, please inform the nursing staff. The pharmacy listed in the electronic medical record should be the one where you would like electronic prescriptions to be sent.  Electronic prescriptions: In compliance with the [place] Strengthen Opioid Misuse Prevention (STOP) Act of 2017 (Session Law 2017-74/H243), effective August 31, 2018, all controlled substances must be electronically prescribed. Calling prescriptions to the pharmacy will cease to exist.  Prescription refills: Only during scheduled appointments. Applies to all prescriptions.  NOTE: The following applies primarily to controlled substances (Opioid* Pain Medications).   Type of encounter (visit): For patients receiving controlled substances, face-to-face visits are required. (Not an option or up to the patient.)  Patient's responsibilities: Pain Pills: Bring all pain pills to every appointment (except for procedure appointments). Pill Bottles: Bring pills in original pharmacy bottle. Always bring the newest bottle. Bring bottle, even if empty. Medication refills: You are responsible for knowing and keeping track of what medications you take and those you need refilled. The day before your appointment: write a list of all prescriptions that need to be refilled. The day of the appointment: give the list to the admitting nurse. Prescriptions will be written only during appointments. No prescriptions will be written on procedure days. If you forget a medication: it will not be "Called in", "Faxed", or "electronically sent". You will  need to get another appointment to get these prescribed. No early refills. Do not call asking to have your prescription filled early. Prescription Accuracy: You are responsible for carefully inspecting your prescriptions before leaving our office. Have the discharge nurse carefully go over each prescription with you, before taking them home. Make sure that your name is accurately spelled, that your address is correct. Check the name and dose of your medication to make sure it is accurate. Check the number of pills, and the written instructions to make sure they are clear and accurate. Make sure that you are given enough medication to last until your next medication refill appointment. Taking Medication: Take medication as prescribed. When it comes to controlled substances, taking less pills or less frequently than prescribed is permitted and encouraged. Never take more pills than instructed. Never take medication more frequently than prescribed.  Inform other Doctors: Always inform, all of your healthcare providers, of all the medications you take. Pain Medication from other Providers: You are not allowed to accept any additional pain medication from any other Doctor or Healthcare provider. There are two exceptions to this rule. (see below) In the event that you require additional pain medication, you are responsible for notifying us, as stated below. Cough Medicine: Often these contain an opioid, such as codeine or hydrocodone. Never accept or take cough medicine containing these opioids if you are already taking an opioid* medication. The combination may cause respiratory failure and death. Medication Agreement: You are responsible for carefully reading and following our Medication Agreement. This must be signed before receiving any prescriptions from our practice. Safely store a copy of your signed Agreement. Violations to the Agreement will result in no further prescriptions. (Additional copies of our  Medication Agreement are available upon request.) Laws, Rules, & Regulations: All patients are expected to follow all Federal and State Laws, Statutes, Rules, & Regulations. Ignorance of   the Laws does not constitute a valid excuse.  Illegal drugs and Controlled Substances: The use of illegal substances (including, but not limited to marijuana and its derivatives) and/or the illegal use of any controlled substances is strictly prohibited. Violation of this rule may result in the immediate and permanent discontinuation of any and all prescriptions being written by our practice. The use of any illegal substances is prohibited. Adopted CDC guidelines & recommendations: Target dosing levels will be at or below 60 MME/day. Use of benzodiazepines** is not recommended.  Exceptions: There are only two exceptions to the rule of not receiving pain medications from other Healthcare Providers. Exception #1 (Emergencies): In the event of an emergency (i.e.: accident requiring emergency care), you are allowed to receive additional pain medication. However, you are responsible for: As soon as you are able, call our office [phone], at any time of the day or night, and leave a message stating your name, the date and nature of the emergency, and the name and dose of the medication prescribed. In the event that your call is answered by a member of our staff, make sure to document and save the date, time, and the name of the person that took your information.  Exception #2 (Planned Surgery): In the event that you are scheduled by another doctor or dentist to have any type of surgery or procedure, you are allowed (for a period no longer than 30 days), to receive additional pain medication, for the acute post-op pain. However, in this case, you are responsible for picking up a copy of our "Post-op Pain Management for Surgeons" handout, and giving it to your surgeon or dentist. This document is available at our office, and  does not require an appointment to obtain it. Simply go to our office during business hours (Monday-Thursday from 8:00 AM to 4:00 PM) (Friday 8:00 AM to 12:00 Noon) or if you have a scheduled appointment with us, prior to your surgery, and ask for it by name. In addition, you are responsible for: calling our office [phone], at any time of the day or night, and leaving a message stating your name, name of your surgeon, type of surgery, and date of procedure or surgery. Failure to comply with your responsibilities may result in termination of therapy involving the controlled substances.  *Opioid medications include: morphine, codeine, oxycodone, oxymorphone, hydrocodone, hydromorphone, meperidine, tramadol, tapentadol, buprenorphine, fentanyl, methadone. **Benzodiazepine medications include: diazepam (Valium), alprazolam (Xanax), clonazepam (Klonopine), lorazepam (Ativan), clorazepate (Tranxene), chlordiazepoxide (Librium), estazolam (Prosom), oxazepam (Serax), temazepam (Restoril), triazolam (Halcion) (Last updated: 07/29/2020) ____________________________________________________________________________________________  ____________________________________________________________________________________________  Medication Recommendations and Reminders  Applies to: All patients receiving prescriptions (written and/or electronic).  Medication Rules & Regulations: These rules and regulations exist for your safety and that of others. They are not flexible and neither are we. Dismissing or ignoring them will be considered "non-compliance" with medication therapy, resulting in complete and irreversible termination of such therapy. (See document titled "Medication Rules" for more details.) In all conscience, because of safety reasons, we cannot continue providing a therapy where the patient does not follow instructions.  Pharmacy of record:  Definition: This is the pharmacy where your electronic  prescriptions will be sent.  We do not endorse any particular pharmacy, however, we have experienced problems with [facility] not securing enough medication supply for the community. We do not restrict you in your choice of pharmacy. However, once we write for your prescriptions, we will NOT be re-sending more prescriptions to fix restricted supply problems   created by your pharmacy, or your insurance.  The pharmacy listed in the electronic medical record should be the one where you want electronic prescriptions to be sent. If you choose to change pharmacy, simply notify our nursing staff.  Recommendations: Keep all of your pain medications in a safe place, under lock and key, even if you live alone. We will NOT replace lost, stolen, or damaged medication. After you fill your prescription, take 1 week's worth of pills and put them away in a safe place. You should keep a separate, properly labeled bottle for this purpose. The remainder should be kept in the original bottle. Use this as your primary supply, until it runs out. Once it's gone, then you know that you have 1 week's worth of medicine, and it is time to come in for a prescription refill. If you do this correctly, it is unlikely that you will ever run out of medicine. To make sure that the above recommendation works, it is very important that you make sure your medication refill appointments are scheduled at least 1 week before you run out of medicine. To do this in an effective manner, make sure that you do not leave the office without scheduling your next medication management appointment. Always ask the nursing staff to show you in your prescription , when your medication will be running out. Then arrange for the receptionist to get you a return appointment, at least 7 days before you run out of medicine. Do not wait until you have 1 or 2 pills left, to come in. This is very poor planning and does not take into consideration that we may need to  cancel appointments due to bad weather, sickness, or emergencies affecting our staff. DO NOT ACCEPT A "Partial Fill": If for any reason your pharmacy does not have enough pills/tablets to completely fill or refill your prescription, do not allow for a "partial fill". The law allows the pharmacy to complete that prescription within 72 hours, without requiring a new prescription. If they do not fill the rest of your prescription within those 72 hours, you will need a separate prescription to fill the remaining amount, which we will NOT provide. If the reason for the partial fill is your insurance, you will need to talk to the pharmacist about payment alternatives for the remaining tablets, but again, DO NOT ACCEPT A PARTIAL FILL, unless you can trust your pharmacist to obtain the remainder of the pills within 72 hours.  Prescription refills and/or changes in medication(s):  Prescription refills, and/or changes in dose or medication, will be conducted only during scheduled medication management appointments. (Applies to both, written and electronic prescriptions.) No refills on procedure days. No medication will be changed or started on procedure days. No changes, adjustments, and/or refills will be conducted on a procedure day. Doing so will interfere with the diagnostic portion of the procedure. No phone refills. No medications will be "called into the pharmacy". No Fax refills. No weekend refills. No Holliday refills. No after hours refills.  Remember:  Business hours are:  Monday to Thursday 8:00 AM to 4:00 PM Provider's Schedule: Francisco Naveira, MD - Appointments are:  Medication management: Monday and Wednesday 8:00 AM to 4:00 PM Procedure day: Tuesday and Thursday 7:30 AM to 4:00 PM Bilal Lateef, MD - Appointments are:  Medication management: Tuesday and Thursday 8:00 AM to 4:00 PM Procedure day: Monday and Wednesday 7:30 AM to 4:00 PM (Last update:  03/20/2020) ____________________________________________________________________________________________  ____________________________________________________________________________________________  CBD (cannabidiol) WARNING    Applicable to: All individuals currently taking or considering taking CBD (cannabidiol) and, more important, all patients taking opioid analgesic controlled substances (pain medication). (Example: oxycodone; oxymorphone; hydrocodone; hydromorphone; morphine; methadone; tramadol; tapentadol; fentanyl; buprenorphine; butorphanol; dextromethorphan; meperidine; codeine; etc.)  Legal status: CBD remains a Schedule I drug prohibited for any use. CBD is illegal with one exception. In the [place], CBD has a limited Food and Drug Administration (FDA) approval for the treatment of two specific types of epilepsy disorders. Only one CBD product has been approved by the FDA for this purpose: "Epidiolex". FDA is aware that some companies are marketing products containing cannabis and cannabis-derived compounds in ways that violate the Federal Food, Drug and Cosmetic Act (FD&C Act) and that may put the health and safety of consumers at risk. The FDA, a Federal agency, has not enforced the CBD status since 2018.   Legality: Some manufacturers ship CBD products nationally, which is illegal. Often such products are sold online and are therefore available throughout the country. CBD is openly sold in head shops and health food stores in some states where such sales have not been explicitly legalized. Selling unapproved products with unsubstantiated therapeutic claims is not only a violation of the law, but also can put patients at risk, as these products have not been proven to be safe or effective. Federal illegality makes it difficult to conduct research on CBD.  Reference: "FDA Regulation of Cannabis and Cannabis-Derived Products, Including Cannabidiol (CBD)" -  https://www.fda.gov/news-events/public-health-focus/fda-regulation-cannabis-and-cannabis-derived-products-including-cannabidiol-cbd  Warning: CBD is not FDA approved and has not undergo the same manufacturing controls as prescription drugs.  This means that the purity and safety of available CBD may be questionable. Most of the time, despite manufacturer's claims, it is contaminated with THC (delta-9-tetrahydrocannabinol - the chemical in marijuana responsible for the "HIGH").  When this is the case, the THC contaminant will trigger a positive urine drug screen (UDS) test for Marijuana (carboxy-THC). Because a positive UDS for any illicit substance is a violation of our medication agreement, your opioid analgesics (pain medicine) may be permanently discontinued.  MORE ABOUT CBD  General Information: CBD  is a derivative of the Marijuana (cannabis sativa) plant discovered in 1940. It is one of the 113 identified substances found in Marijuana. It accounts for up to 40% of the plant's extract. As of 2018, preliminary clinical studies on CBD included research for the treatment of anxiety, movement disorders, and pain. CBD is available and consumed in multiple forms, including inhalation of smoke or vapor, as an aerosol spray, and by mouth. It may be supplied as an oil containing CBD, capsules, dried cannabis, or as a liquid solution. CBD is thought not to be as psychoactive as THC (delta-9-tetrahydrocannabinol - the chemical in marijuana responsible for the "HIGH"). Studies suggest that CBD may interact with different biological target receptors in the body, including cannabinoid and other neurotransmitter receptors. As of 2018 the mechanism of action for its biological effects has not been determined.  Side-effects  Adverse reactions: Dry mouth, diarrhea, decreased appetite, fatigue, drowsiness, malaise, weakness, sleep disturbances, and others.  Drug interactions: CBC may interact with other medications  such as blood-thinners. (Last update: 04/06/2020) ____________________________________________________________________________________________  ____________________________________________________________________________________________  Drug Holidays (Slow)  What is a "Drug Holiday"? Drug Holiday: is the name given to the period of time during which a patient stops taking a medication(s) for the purpose of eliminating tolerance to the drug.  Benefits Improved effectiveness of opioids. Decreased opioid dose needed to achieve benefits. Improved pain with lesser dose.    What is tolerance? Tolerance: is the progressive decreased in effectiveness of a drug due to its repetitive use. With repetitive use, the body gets use to the medication and as a consequence, it loses its effectiveness. This is a common problem seen with opioid pain medications. As a result, a larger dose of the drug is needed to achieve the same effect that used to be obtained with a smaller dose.  How long should a "Drug Holiday" last? You should stay off of the pain medicine for at least 14 consecutive days. (2 weeks)  Should I stop the medicine "cold [place]"? No. You should always coordinate with your Pain Specialist so that he/she can provide you with the correct medication dose to make the transition as smoothly as possible.  How do I stop the medicine? Slowly. You will be instructed to decrease the daily amount of pills that you take by one (1) pill every seven (7) days. This is called a "slow downward taper" of your dose. For example: if you normally take four (4) pills per day, you will be asked to drop this dose to three (3) pills per day for seven (7) days, then to two (2) pills per day for seven (7) days, then to one (1) per day for seven (7) days, and at the end of those last seven (7) days, this is when the "Drug Holiday" would start.   Will I have withdrawals? By doing a "slow downward taper" like this one, it  is unlikely that you will experience any significant withdrawal symptoms. Typically, what triggers withdrawals is the sudden stop of a high dose opioid therapy. Withdrawals can usually be avoided by slowly decreasing the dose over a prolonged period of time. If you do not follow these instructions and decide to stop your medication abruptly, withdrawals may be possible.  What are withdrawals? Withdrawals: refers to the wide range of symptoms that occur after stopping or dramatically reducing opiate drugs after heavy and prolonged use. Withdrawal symptoms do not occur to patients that use low dose opioids, or those who take the medication sporadically. Contrary to benzodiazepine (example: Valium, Xanax, etc.) or alcohol withdrawals ("Delirium Tremens"), opioid withdrawals are not lethal. Withdrawals are the physical manifestation of the body getting rid of the excess receptors.  Expected Symptoms Early symptoms of withdrawal may include: Agitation Anxiety Muscle aches Increased tearing Insomnia Runny nose Sweating Yawning  Late symptoms of withdrawal may include: Abdominal cramping Diarrhea Dilated pupils Goose bumps Nausea Vomiting  Will I experience withdrawals? Due to the slow nature of the taper, it is very unlikely that you will experience any.  What is a slow taper? Taper: refers to the gradual decrease in dose.  (Last update: 03/20/2020) ____________________________________________________________________________________________   Naloxone injection What is this medication? NALOXONE (nal OX one) is a narcotic blocker. It is used to treat narcotic (opioid) drug overdose. It is used to temporarily reverse the effects of opioid medicines. This medicine has no effect in people who are not taking opioid medicines. This medicine may be used for other purposes; ask your health care provider or pharmacist if you have questions. COMMON BRAND NAME(S): EVZIO, Narcan, ZIMHI What should  I tell my care team before I take this medication? They need to know if you have any of these conditions: drug abuse or addiction heart disease an unusual or allergic reaction to naloxone, other medicines, foods, dyes, or preservatives pregnant or trying to get pregnantbreast-feeding How should I use this medication? This medicine may be  administered in a hospital, clinic, or can be used by the public to give aid to a person who has overdosed until emergency medical help is available. This medicine is for injection into the outer thigh. It can be injected through clothing if needed. Get emergency medical help right away after giving the first dose of this medicine, even if the person wakes up. You should be familiar with how to recognize the signs and symptoms of a narcotic overdose. Administer according to the printed instructions on the device label or the electronic voice instructions. You should practice using the Trainer injector before this medicine is needed. Talk to your pediatrician regarding the use of this medicine in children. While this drug may be prescribed for children as young as newborn for selected conditions, precautions do apply. For infants less than 1 year of age, pinch the thigh muscle while administering. Overdosage: If you think you have taken too much of this medicine contact a poison control center or emergency room at once. NOTE: This medicine is only for you. Do not share this medicine with others. What if I miss a dose? This does not apply. What may interact with this medication? This medicine is only used during an emergency. No interactions are expected during emergency use. This list may not describe all possible interactions. Give your health care provider a list of all the medicines, herbs, non-prescription drugs, or dietary supplements you use. Also tell them if you smoke, drink alcohol, or use illegal drugs. Some items may interact with your medicine. What should  I watch for while using this medication? Keep this medicine ready for use in the case of a narcotic overdose. Make sure that you have the phone number of your doctor or health care professional and local hospital ready. You may need to have additional doses of this medicine. Each injector contains a single dose. Some emergencies may require additional doses. After use, bring the treated person to the nearest hospital or call 911. Make sure the treating health care professional knows that the person has received an injection of this medicine. You will receive additional instructions on what to do during and after use of this medicine before an emergency occurs. What side effects may I notice from receiving this medication? Side effects that you should report to your doctor or health care professional as soon as possible: allergic reactions like skin rash, itching or hives, swelling of the face, lips, or tongue breathing problems fast, irregular heartbeat high blood pressure pain that was controlled by narcotic pain medicine seizures Side effects that usually do not require medical attention (report to your doctor or health care professional if they continue or are bothersome): anxious chills diarrhea fever nausea, vomiting sweating This list may not describe all possible side effects. Call your doctor for medical advice about side effects. You may report side effects to FDA at 1-800-FDA-1088. Where should I keep my medication? Keep out of the reach of children. Store at room temperature between 15 and 25 degrees C (59 and 77 degrees F). If you are using this medicine at home, you will be instructed on how to store this medicine. Keep this medicine in its outer case until ready to use. Occasionally check the solution through the viewing window of the injector. The solution should be clear. If it is discolored, cloudy, or contains solid particles, replace it with a new injector. Remember to check  the expiration date of this medicine regularly. Throw away any unused medicine after  the expiration date. NOTE: This sheet is a summary. It may not cover all possible information. If you have questions about this medicine, talk to your doctor, pharmacist, or health care provider.  2022 Elsevier/Gold Standard (2015-08-27 16:45:38) Naloxone nasal spray What is this medication? NALOXONE (nal OX one) is a narcotic blocker. It is used to treat narcotic drug overdose. This medicine may be used for other purposes; ask your health care provider or pharmacist if you have questions. COMMON BRAND NAME(S): Kloxxado, Narcan What should I tell my care team before I take this medication? They need to know if you have any of these conditions: drug abuse or addiction heart disease an unusual or allergic reaction to naloxone, other medicines, foods, dyes, or preservatives pregnant or trying to get pregnant breast-feeding How should I use this medication? This medicine is for use in the nose. Lay the person on their back. Support their neck with your hand and allow the head to tilt back before giving the medicine. The nasal spray should be given into 1 nostril. After giving the medicine, move the person onto their side. Do not remove or test the nasal spray until ready to use. Get emergency medical help right away after giving the first dose of this medicine, even if the person wakes up. You should be familiar with how to recognize the signs and symptoms of a narcotic overdose. If more doses are needed, give the additional dose in the other nostril. Talk to your pediatrician regarding the use of this medicine in children. While this drug may be prescribed for children as young as newborns for selected conditions, precautions do apply. Overdosage: If you think you have taken too much of this medicine contact a poison control center or emergency room at once. NOTE: This medicine is only for you. Do not share this  medicine with others. What if I miss a dose? This does not apply. What may interact with this medication? This medicine is only used during an emergency. No interactions are expected during emergency use. This list may not describe all possible interactions. Give your health care provider a list of all the medicines, herbs, non-prescription drugs, or dietary supplements you use. Also tell them if you smoke, drink alcohol, or use illegal drugs. Some items may interact with your medicine. What should I watch for while using this medication? Keep this medicine ready for use in the case of a narcotic overdose. Make sure that you have the phone number of your doctor or health care professional and local hospital ready. You may need to have additional doses of this medicine. Each nasal spray contains a single dose. Some emergencies may require additional doses. After use, bring the treated person to the nearest hospital or call 911. Make sure the treating health care professional knows that the person has received an injection of this medicine. You will receive additional instructions on what to do during and after use of this medicine before an emergency occurs. What side effects may I notice from receiving this medication? Side effects that you should report to your doctor or health care professional as soon as possible: allergic reactions like skin rash, itching or hives, swelling of the face, lips, or tongue breathing problems fast, irregular heartbeat high blood pressure pain that was controlled by narcotic pain medicine seizures Side effects that usually do not require medical attention (report to your doctor or health care professional if they continue or are bothersome): anxious chills diarrhea fever headache muscle pain nausea,  vomiting nose irritation like dryness, congestion, and swelling sweating This list may not describe all possible side effects. Call your doctor for medical  advice about side effects. You may report side effects to FDA at 1-800-FDA-1088. Where should I keep my medication? Keep out of the reach of children and pets. Store between 20 and 25 degrees C (68 and 77 degrees F). Do not freeze. Throw away any unused medicine after the expiration date. Keep in original box until ready to use. NOTE: This sheet is a summary. It may not cover all possible information. If you have questions about this medicine, talk to your doctor, pharmacist, or health care provider.  2022 Elsevier/Gold Standard (2020-01-02 12:08:55) Opioid Overdose Opioids are drugs that are often used to treat pain. Opioids include illegal drugs, such as heroin, as well as prescription pain medicines, such as codeine, morphine, hydrocodone, and fentanyl. An opioid overdose happens when you take too much of an opioid. An overdose may be intentional or accidental and can happen with any type of opioid. The effects of an overdose can be mild, dangerous, or even deadly. Opioid overdose is a medical emergency. What are the causes? This condition may be caused by: Taking too much of an opioid on purpose. Taking too much of an opioid by accident. Using two or more substances that contain opioids at the same time. Taking an opioid with a substance that affects your heart, breathing, or blood pressure. These include alcohol, tranquilizers, sleeping pills, illegal drugs, and some over-the-counter medicines. This condition may also happen due to an error made by: A health care provider who prescribes a medicine. The pharmacist who fills the prescription. What increases the risk? This condition is more likely in: Children. They may be attracted to colorful pills. Because of a child's small size, even a small amount of a medicine can be dangerous. Older people. They may be taking many different medicines. Older people may have difficulty reading labels or remembering when they last took their medicines.  They may also be more sensitive to the effects of opioids. People with chronic medical conditions, especially heart, liver, kidney, or neurological diseases. People who take an opioid for a long period of time. People who take opioids and use illegal drugs, such as heroin, or other substances, such as alcohol. People who: Have a history of drug or alcohol abuse. Have certain mental health conditions. Have a history of previous drug overdoses. People who take opioids that are not prescribed for them. What are the signs or symptoms? Symptoms of this condition depend on the type of opioid and the amount that was taken. Common symptoms include: Sleepiness or difficulty waking from sleep. Confusion. Slurred speech. Slowed breathing and a slow pulse (bradycardia). Nausea and vomiting. Abnormally small pupils. Signs and symptoms that require emergency treatment include: Cold, clammy, and pale skin. Blue lips and fingernails. Vomiting. Gurgling sounds in the throat. A pulse that is very slow or difficult to detect. Breathing that is very irregular, slow, noisy, or difficult to detect. Inability to respond to speech or be awakened from sleep (stupor). Seizures. How is this diagnosed? This condition is diagnosed based on your symptoms and medical history. It is important to tell your health care provider: About all of the opioids that you took. When you took the opioids. Whether you were drinking alcohol or using marijuana, cocaine, or other drugs. Your health care provider will do a physical exam. This exam may include: Checking and monitoring your heart rate and rhythm,  breathing rate, temperature, and blood pressure. Measuring oxygen levels in your blood. Checking for abnormally small pupils. You may also have blood tests or urine tests. You may have X-rays if you are having severe breathing problems. How is this treated? This condition requires immediate medical treatment and  hospitalization. Reversing the effects of the opioid is the first step in treatment. If you have a Narcan kit or naloxone, use it right away. Follow your health care provider's instructions. A friend or family member can also help you with this. The rest of your treatment will be given in the hospital intensive care (ICU). Treatment in the hospital may include: Giving salts and minerals (electrolytes) along with fluids through an IV. Inserting a breathing tube (endotracheal tube) in your airway to help you breathe if you cannot breathe on your own or you are in danger of not being able to breathe on your own. Giving oxygen through a small tube under your nose. Passing a tube through your nose and into your stomach (nasogastric tube, or NG tube) to empty your stomach. Giving medicines that: Increase your blood pressure. Relieve nausea and vomiting. Relieve abdominal pain and cramping. Reverse the effects of the opioid (naloxone). Monitoring your heart and oxygen levels. Ongoing counseling and mental health support if you intentionally overdosed or used an illegal drug. Follow these instructions at home: Medicines Take over-the-counter and prescription medicines only as told by your health care provider. Always ask your health care provider about possible side effects and interactions of any new medicine that you start taking. Keep a list of all the medicines that you take, including over-the-counter medicines. Bring this list with you to all your medical visits. General instructions Drink enough fluid to keep your urine pale yellow. Keep all follow-up visits. This is important. How is this prevented? Read the drug inserts that come with your opioid pain medicines. Take medicines only as told by your health care provider. Do not take more medicine than you are told. Do not take medicines more frequently than you are told. Do not drink alcohol or take sedatives when taking opioids. Do not use  illegal or recreational drugs, including cocaine, ecstasy, and marijuana. Do not take opioid medicines that are not prescribed for you. Store all medicines in safety containers that are out of the reach of children. Get help if you are struggling with: Alcohol or drug use. Depression or another mental health problem. Thoughts of hurting yourself or another person. Keep the phone number of your local poison control center near your phone or in your mobile phone. In the [place] the hotline of the [facility] is (308)179-3701. If you were prescribed naloxone, make sure you understand how to take it. Contact a health care provider if: You need help understanding how to take your pain medicines. You feel your medicines are too strong. You are concerned that your pain medicines are not working well for your pain. You develop new symptoms or side effects when you are taking medicines. Get help right away if: You or someone else is having symptoms of an opioid overdose. Get help even if you are not sure. You have thoughts about hurting yourself or others. You have: Chest pain. Difficulty breathing. A loss of consciousness. These symptoms may represent a serious problem that is an emergency. Do not wait to see if the symptoms will go away. Get medical help right away. Call your local emergency services (911 in the [place] Do not drive yourself  to the hospital. If you ever feel like you may hurt yourself or others, or have thoughts about taking your own life, get help right away. You can go to your nearest emergency department or: Call your local emergency services (911 in the [place] Call a suicide crisis helpline, such as the [place] at 917-287-0085. This is open 24 hours a day in the [place] Text the Crisis Text Line at 7791631642 (in the [place].). Summary Opioids are drugs that are often used to treat pain. Opioids include illegal drugs, such as heroin, as  well as prescription pain medicines. An opioid overdose happens when you take too much of an opioid. Overdoses can be intentional or accidental. Opioid overdose is very dangerous. It is a life-threatening emergency. If you or someone you know is experiencing an opioid overdose, get help right away. This information is not intended to replace advice given to you by your health care provider. Make sure you discuss any questions you have with your health care provider. Document Revised: 11/27/2020 Document Reviewed: 11/27/2020 Elsevier Patient Education  [place].

## 2021-05-16 ENCOUNTER — Telehealth: Payer: Self-pay

## 2021-05-16 NOTE — Telephone Encounter (Signed)
Post IT pump refill follow up phone call.  LM ?

## 2021-05-21 MED FILL — Medication: INTRATHECAL | Qty: 1 | Status: AC

## 2021-05-28 DIAGNOSIS — J329 Chronic sinusitis, unspecified: Secondary | ICD-10-CM | POA: Diagnosis not present

## 2021-05-28 DIAGNOSIS — I10 Essential (primary) hypertension: Secondary | ICD-10-CM | POA: Diagnosis not present

## 2021-05-28 DIAGNOSIS — Z0000 Encounter for general adult medical examination without abnormal findings: Secondary | ICD-10-CM | POA: Diagnosis not present

## 2021-06-02 ENCOUNTER — Other Ambulatory Visit: Payer: Medicare HMO

## 2021-06-04 ENCOUNTER — Telehealth (HOSPITAL_COMMUNITY): Payer: Self-pay | Admitting: *Deleted

## 2021-06-04 ENCOUNTER — Other Ambulatory Visit: Payer: Medicare HMO

## 2021-06-04 ENCOUNTER — Telehealth: Payer: Self-pay | Admitting: Cardiology

## 2021-06-04 NOTE — Telephone Encounter (Signed)
Patient returning call regarding upcoming cardiac imaging study; pt verbalizes understanding of appt date/time, parking situation and where to check in, pre-test NPO status and medications ordered, and verified current allergies; name and call back number provided for further questions should they arise  Aislin Onofre RN Navigator Cardiac Imaging [facility] [phone] office [phone] cell   

## 2021-06-04 NOTE — Telephone Encounter (Signed)
Attempted to call patient regarding upcoming cardiac CT appointment. Patient's husband picked up phone and left name and call back number for patient to call back.  Larey Brick RN Navigator Cardiac Imaging [facility] [phone] Office [phone] Cell

## 2021-06-04 NOTE — Telephone Encounter (Signed)
Pts husband is wanting to know if the blood work recently done at pts pcp would suffice... husband was not sure what panels were done.. please advise

## 2021-06-04 NOTE — Telephone Encounter (Signed)
Returned call to patient's husband, per DPR, and advised that lab work can be within 30 days of ablation so patient's labs done on 9/28 for ablation scheduled 10/14 are acceptable. Patient's husband verbalized understanding and thanked me for the call.

## 2021-06-06 ENCOUNTER — Ambulatory Visit (HOSPITAL_COMMUNITY)
Admission: RE | Admit: 2021-06-06 | Discharge: 2021-06-06 | Disposition: A | Discharge status: Home / Self Care | Payer: Medicare HMO | Source: Ambulatory Visit | Attending: Cardiology | Admitting: Cardiology

## 2021-06-06 ENCOUNTER — Emergency Department (HOSPITAL_COMMUNITY)
Admission: EM | Admit: 2021-06-06 | Discharge: 2021-06-06 | Disposition: A | Discharge status: Home / Self Care | Payer: Medicare HMO | Attending: Emergency Medicine | Admitting: Emergency Medicine

## 2021-06-06 ENCOUNTER — Encounter (HOSPITAL_COMMUNITY): Payer: Self-pay

## 2021-06-06 ENCOUNTER — Other Ambulatory Visit: Payer: Self-pay

## 2021-06-06 DIAGNOSIS — R002 Palpitations: Secondary | ICD-10-CM | POA: Diagnosis present

## 2021-06-06 DIAGNOSIS — Z96652 Presence of left artificial knee joint: Secondary | ICD-10-CM | POA: Insufficient documentation

## 2021-06-06 DIAGNOSIS — I4891 Unspecified atrial fibrillation: Secondary | ICD-10-CM | POA: Insufficient documentation

## 2021-06-06 DIAGNOSIS — Z87891 Personal history of nicotine dependence: Secondary | ICD-10-CM | POA: Diagnosis not present

## 2021-06-06 DIAGNOSIS — Z79899 Other long term (current) drug therapy: Secondary | ICD-10-CM | POA: Insufficient documentation

## 2021-06-06 DIAGNOSIS — Z7901 Long term (current) use of anticoagulants: Secondary | ICD-10-CM | POA: Diagnosis not present

## 2021-06-06 DIAGNOSIS — I10 Essential (primary) hypertension: Secondary | ICD-10-CM | POA: Insufficient documentation

## 2021-06-06 LAB — COMPREHENSIVE METABOLIC PANEL
ALT: 29 U/L (ref 0–44)
AST: 30 U/L (ref 15–41)
Albumin: 3.6 g/dL (ref 3.5–5.0)
Alkaline Phosphatase: 91 U/L (ref 38–126)
Anion gap: 8 (ref 5–15)
BUN: 12 mg/dL (ref 6–20)
CO2: 26 mmol/L (ref 22–32)
Calcium: 9.2 mg/dL (ref 8.9–10.3)
Chloride: 106 mmol/L (ref 98–111)
Creatinine, Ser: 0.8 mg/dL (ref 0.44–1.00)
GFR, Estimated: >60 mL/min (ref >60)
Glucose, Bld: 110 mg/dL — ABNORMAL HIGH (ref 70–99)
Potassium: 4.1 mmol/L (ref 3.5–5.1)
Sodium: 140 mmol/L (ref 135–145)
Total Bilirubin: 0.6 mg/dL (ref 0.3–1.2)
Total Protein: 6.6 g/dL (ref 6.5–8.1)

## 2021-06-06 LAB — CBC
HCT: 49.1 % — ABNORMAL HIGH (ref 36.0–46.0)
Hemoglobin: 15.9 g/dL — ABNORMAL HIGH (ref 12.0–15.0)
MCH: 29.5 pg (ref 26.0–34.0)
MCHC: 32.4 g/dL (ref 30.0–36.0)
MCV: 91.1 fL (ref 80.0–100.0)
Platelets: 209 10*3/uL (ref 150–400)
RBC: 5.39 MIL/uL — ABNORMAL HIGH (ref 3.87–5.11)
RDW: 14.2 % (ref 11.5–15.5)
WBC: 9.7 10*3/uL (ref 4.0–10.5)
nRBC: 0 % (ref 0.0–0.2)

## 2021-06-06 MED ORDER — DILTIAZEM LOAD VIA INFUSION
20.0000 mg | Freq: Once | INTRAVENOUS | Status: AC
  Administered 2021-06-06: 20 mg via INTRAVENOUS
  Filled 2021-06-06: qty 20

## 2021-06-06 MED ORDER — DILTIAZEM HCL-DEXTROSE 125-5 MG/125ML-% IV SOLN (PREMIX)
5.0000 mg/h | INTRAVENOUS | Status: DC
  Administered 2021-06-06: 5 mg/h via INTRAVENOUS
  Filled 2021-06-06: qty 125

## 2021-06-06 MED ORDER — METOPROLOL TARTRATE 25 MG PO TABS
50.0000 mg | ORAL_TABLET | Freq: Once | ORAL | Status: AC
  Administered 2021-06-06: 50 mg via ORAL
  Filled 2021-06-06: qty 2

## 2021-06-06 MED ORDER — METOPROLOL TARTRATE 5 MG/5ML IV SOLN
2.5000 mg | Freq: Once | INTRAVENOUS | Status: AC
  Administered 2021-06-06: 2.5 mg via INTRAVENOUS
  Filled 2021-06-06: qty 5

## 2021-06-06 NOTE — ED Triage Notes (Signed)
Pt was in the IR suite waiting for a CT with outpatient when they hooked her up to a monitor and saw she was in a-fib RVR with her HR in the 130's-160's. Pt is scheduled for a procedure next week for a-fib. Pt states she was feeling SHOB and dizzy this morning but that has subsided. Pt denies any pain at this time.

## 2021-06-06 NOTE — ED Provider Notes (Signed)
[facility] EMERGENCY DEPARTMENT Provider Note   CSN: [id number] Arrival date & time: 06/06/21  1407     History Chief Complaint  Patient presents with   a fib rvr     PA TENNANT is a 60 y.o. female.  Patient presents to ER chief complaint of rapid heart rate chest palpitations.  Symptoms began earlier today while she was getting an outpatient CT scan.  She otherwise denies any pain.  No fever no cough no vomiting no diarrhea.  She has a history of paroxysmal atrial fibrillation.  She typically takes metoprolol 50 mg daily, she was told to hold the medication due to upcoming cardiac tests she was supposed to have done and she has not taken her medicine today.      Past Medical History:  Diagnosis Date   Anxiety    off of valium- stress   Arthritis    left knee- rhumatoid- hasnt seen specialist    Chronic lower back pain    Complication of anesthesia    WOKE DURING BACK SURGERY ; urinary retention 12/20/2017   Hyperlipidemia    Hypertension    Migraine    "when seasons change" (12/20/2017)   Neuromuscular disorder (HCC)    Restless leg syndrome    Seasonal allergies    Skin abnormalities    Wears glasses     Patient Active Problem List   Diagnosis Date Noted   S/P total knee arthroplasty, left 12/19/2020   Uncomplicated opioid dependence (HCC) 08/15/2020   Weight gain 03/13/2019   End of battery life of intrathecal infusion pump 12/13/2018   Primary osteoarthritis of left knee 12/20/2017   Degenerative arthritis of left knee 12/16/2017   DDD (degenerative disc disease), 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   Effusion of knee (Left) 09/29/2017   DDD (degenerative disc disease), lumbar 09/29/2017   Lumbar intervertebral disc protrusion (L2-3) 09/29/2017   Restless legs syndrome 07/11/2017   Lumbar facet syndrome (Bilateral) (R>L) 06/16/2017   Drug-related hair loss 05/31/2017   Screening for cervical cancer 05/30/2017   Neurogenic pain 05/26/2017    Neuropathic pain 05/26/2017   Chronic musculoskeletal pain 05/26/2017   Presence of intrathecal pump 05/25/2017   Adjustment and management of infusion pump 05/25/2017   Disorder of skeletal system 05/25/2017   Chronic pain syndrome 05/25/2017   Problems influencing health status 05/25/2017   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   Chronic low back pain (Primary Area of Pain) (Bilateral) (R>L) 05/25/2017   Chronic lower extremity pain (Secondary Area of Pain) (Bilateral) (R>L) 05/25/2017   Long term prescription opiate use 05/25/2017   Opiate use (93.16 MME/day) 05/25/2017   Cigarette nicotine dependence without complication 11/24/2016   Psoriasis 11/24/2016   Need for influenza vaccination 08/26/2016   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   Battery end of life of spinal cord stimulator 06/04/2016   Pure hypercholesterolemia 02/06/2016   Need for hepatitis C screening test 02/03/2016   Palpitations 02/03/2016   Pharmacologic therapy 02/03/2016   Chronic knee pain (Left) 01/01/2016   Adjustment disorder with anxiety 04/02/2015   Chronic lumbosacral radiculopathy (L5) (Left) 04/02/2015   Primary insomnia 02/18/2015   Chronic back pain greater than 3 months duration 11/12/2014   Agoraphobia with panic disorder 05/23/2014   Muscle spasm of back 09/11/2013   Benign essential hypertension 11/06/2011   Unspecified constipation 10/05/2007   Abnormal weight gain 10/05/2007   Opioid-induced constipation (OIC) 02/10/2007    Past Surgical History:  Procedure Laterality Date  BACK SURGERY     8-9 SURGERIES; lower back   COLONOSCOPY     INTRATHECAL PUMP REVISION N/A 05/09/2019   Procedure: Intrathecal pump change;  Surgeon: Odette Fraction, MD;  Location: [facility] OR;  Service: Neurosurgery;  Laterality: N/A;  Intrathecal pump change   JOINT REPLACEMENT     PAIN PUMP IMPLANTATION  05/10/2012   Procedure: PAIN PUMP INSERTION;  Surgeon: Maeola Harman, MD;  Location: [facility]UR[facility] ORS;  Service: Neurosurgery;  Laterality: N/A;  Pump replacement   SPINAL CORD STIMULATOR BATTERY EXCHANGE N/A 06/09/2016   Procedure: IMPLANTABLE PULSE GENERATOR REPLACEMENT WITH RECHARGEABLE BATTERY;  Surgeon: Maeola Harman, MD;  Location: [facility] OR;  Service: Neurosurgery;  Laterality: N/A;   TOTAL KNEE ARTHROPLASTY Left 12/20/2017   TOTAL KNEE ARTHROPLASTY Left 12/20/2017   Procedure: TOTAL KNEE ARTHROPLASTY;  Surgeon: Gean Birchwood, MD;  Location: [facility] OR;  Service: Orthopedics;  Laterality: Left;   TOTAL KNEE REVISION Left 12/19/2020   Procedure: TOTAL KNEE REVISION/ Total Knee Polynethylene Barring, Attune Total Knee;  Surgeon: Gean Birchwood, MD;  Location: [facility] ORS;  Service: Orthopedics;  Laterality: Left;   VAGINAL HYSTERECTOMY       OB History   No obstetric history on file.     Family History  Problem Relation Age of Onset   Aortic aneurysm Mother    Diabetes Maternal Grandmother     Social History   Tobacco Use   Smoking status: Former    Packs/day: 1.00    Years: 30.00    Pack years: 30.00    Types: Cigarettes    Quit date: 11/12/2018    Years since quitting: 2.5   Smokeless tobacco: Never  Vaping Use   Vaping Use: Former  Substance Use Topics   Alcohol use: No   Drug use: Yes    Types: Oxycodone    Home Medications Prior to Admission medications   Medication Sig Start Date End Date Taking? Authorizing Provider  AMBULATORY NON FORMULARY MEDICATION Medication Name:  Intrathecal pump Fentanyl 1,000.0 mcg/ml Bupivacaine 20.0 mg/ml 40 ml pump Rate 357.2 mcg/day    [provider]  apixaban (ELIQUIS) 5 MG TABS tablet Take 1 tablet (5 mg total) by mouth 2 (two) times daily. 12/27/20   Lewayne Bunting, MD  atorvastatin (LIPITOR) 20 MG tablet Take 20 mg by mouth every morning.  04/29/16   [provider]  lisinopril (ZESTRIL) 20 MG tablet Take 2 tablets (40 mg total) by mouth every morning. 06/12/20   Fayrene Helper, PA-C  metoprolol succinate  (TOPROL-XL) 25 MG 24 hr tablet Take 2 tablets (50 mg total) by mouth daily. 12/30/20   Parke Poisson, MD  morphine (MS CONTIN) 30 MG 12 hr tablet Take 1 tablet (30 mg total) by mouth every 12 (twelve) hours. Must last 30 days. Do not break tablet 05/19/21 06/18/21  Delano Metz, MD  morphine (MS CONTIN) 30 MG 12 hr tablet Take 1 tablet (30 mg total) by mouth every 12 (twelve) hours. Must last 30 days. Do not break tablet 06/18/21 07/18/21  Delano Metz, MD  morphine (MS CONTIN) 30 MG 12 hr tablet Take 1 tablet (30 mg total) by mouth every 12 (twelve) hours. Must last 30 days. Do not break tablet 07/18/21 08/17/21  Delano Metz, MD  naloxone [facility]) nasal spray 4 mg/0.1 mL Place 1 spray into the nose as needed for up to 365 doses (for opioid-induced respiratory depresssion). In case of emergency (overdose), spray once into each nostril. If no response within 3  minutes, repeat application and call 911. 05/15/21 05/15/22  Delano Metz, MD  rOPINIRole (REQUIP) 0.5 MG tablet Take 1 mg by mouth at bedtime. Restless legs 10/22/17   [provider]    Allergies    Benadryl [diphenhydramine hcl], Bee venom, and Dexamethasone  Review of Systems   Review of Systems  Constitutional:  Negative for fever.  HENT:  Negative for ear pain.   Eyes:  Negative for pain.  Respiratory:  Negative for cough.   Cardiovascular:  Positive for palpitations. Negative for chest pain.  Gastrointestinal:  Negative for abdominal pain.  Genitourinary:  Negative for flank pain.  Musculoskeletal:  Negative for back pain.  Skin:  Negative for rash.  Neurological:  Negative for headaches.   Physical Exam Updated Vital Signs BP (!) 163/114   Pulse 85   Temp 99 F (37.2 C) (Oral)   Resp (!) 21   Ht 5\' 3"  (1.6 m)   Wt 72.6 kg   SpO2 92%   BMI 28.34 kg/m   Physical Exam Constitutional:      General: She is not in acute distress.    Appearance: Normal appearance.  HENT:     Head:  Normocephalic.     Nose: Nose normal.  Eyes:     Extraocular Movements: Extraocular movements intact.  Cardiovascular:     Rate and Rhythm: Tachycardia present. Rhythm irregular.  Pulmonary:     Effort: Pulmonary effort is normal.  Musculoskeletal:        General: Normal range of motion.     Cervical back: Normal range of motion.  Neurological:     General: No focal deficit present.     Mental Status: She is alert. Mental status is at baseline.    ED Results / Procedures / Treatments   Labs (all labs ordered are listed, but only abnormal results are displayed) Labs Reviewed  CBC - Abnormal; Notable for the following components:      Result Value   RBC 5.39 (*)    Hemoglobin 15.9 (*)    HCT 49.1 (*)    All other components within normal limits  COMPREHENSIVE METABOLIC PANEL - Abnormal; Notable for the following components:   Glucose, Bld 110 (*)    All other components within normal limits    EKG EKG Interpretation  Date/Time:  Friday June 06 2021 14:09:19 EDT Ventricular Rate:  151 PR Interval:    QRS Duration: 80 QT Interval:  279 QTC Calculation: 443 R Axis:   66 Text Interpretation: Atrial fibrillation with rapid ventricular response Non-specific ST-t changes Confirmed by [id number] (Cathren Laine) on 06/06/2021 2:46:37 PM  Radiology No results found.  Procedures .Critical Care Performed by: 08/06/2021, MD Authorized by: Cheryll Cockayne, MD   Critical care provider statement:    Critical care time (minutes):  40   Critical care time was exclusive of:  Separately billable procedures and treating other patients   Critical care was necessary to treat or prevent imminent or life-threatening deterioration of the following conditions:  Cardiac failure   Medications Ordered in ED Medications  diltiazem (CARDIZEM) 1 mg/mL load via infusion 20 mg (20 mg Intravenous Bolus from Bag 06/06/21 1501)    And  diltiazem (CARDIZEM) 125 mg in dextrose 5% 125 mL (1 mg/mL)  infusion (0 mg/hr Intravenous Stopped 06/06/21 1550)  metoprolol tartrate (LOPRESSOR) tablet 50 mg (50 mg Oral Given 06/06/21 1552)  metoprolol tartrate (LOPRESSOR) injection 2.5 mg (2.5 mg Intravenous Given 06/06/21 1644)  ED Course  I have reviewed the triage vital signs and the nursing notes.  Pertinent labs & imaging results that were available during my care of the patient were reviewed by me and considered in my medical decision making (see chart for details).    MDM Rules/Calculators/A&P                           Patient clinically appears comfortable.  However vital signs show rapid tachycardia irregular heartbeat ranging from 150 to 180 bpm.  Patient given diltiazem with improvement of heart rate.  This was transitioned to metoprolol.  She continues to be having an irregular rhythm but appears to be have rates of now 8200 bpm which is appropriate.  She has no additional symptoms feels fine preferring to go home.  Patient discharged home in stable condition, advised follow-up with her cardiologist within the week.  Advising immediate return for recurrent palpitations rapid heart rate or pain or any additional concerns.  Final Clinical Impression(s) / ED Diagnoses Final diagnoses:  Atrial fibrillation with rapid ventricular response [facility])    Rx / DC Orders ED Discharge Orders     None        Cheryll Cockayne, MD 06/06/21 1735

## 2021-06-06 NOTE — Progress Notes (Signed)
Patient brought to Nurses Station to prepare her for her Pulmonary Vein CT scan. When placed on monitor at 1337 patient in atrial fib with rapid ventricular response of 150's to 180's. BP 152/102. When asked how she is feeling states not great and that she has a sinus infection. Has not taken any cold medicine today. When walking down hallway to get to radiology waiting room was dizzy and light headed. No chest pain or SOB. Color pink and skin warm and dry. Is on Toprol 50 mg daily. On Eliquis and has not missed a dose. Dr Dagmar Hait called and aware of rhythm, heart rate and BP. EKG ordered and done at 1352. Dr Dagmar Hait would like patient to go to the Emergency Dept to be evaluated and treated. IV started. Drinda Butts RN and Philis Pique RN, CT Nurse Navigators notified. Drinda Butts will notify Dr Elberta Fortis. Philis Pique present. Husband and son brought to Nurses Station and explained to them about the rapid heart rate and that we will be taking patient to the ED. Patient taken to the ED by wheelchairat 1405, monitored and accompanied by Kennon Portela RN and Philis Pique RN and patient husband and son. Report given to ED RN including that has not taken any cold medicine today and that she has not missed a dose of Eliquis. Philis Pique RN will reschedule patient for Pullmonary vein CT.

## 2021-06-06 NOTE — Discharge Instructions (Addendum)
Call your primary care doctor or specialist as discussed in the next 2-3 days.   Return immediately back to the ER if:  Your symptoms worsen within the next 12-24 hours. You develop new symptoms such as new fevers, persistent vomiting, new pain, shortness of breath, or new weakness or numbness, or if you have any other concerns.  

## 2021-06-09 ENCOUNTER — Telehealth: Payer: Self-pay | Admitting: Cardiology

## 2021-06-09 NOTE — Telephone Encounter (Signed)
Husband of the patient called. The patient was in Afib on Friday and was not able to have her CT done. The patient went to the ED as a result.  The Husband wanted to know if the patient is still going to be able to have her Ablation on Friday.  Please advise

## 2021-06-09 NOTE — Telephone Encounter (Signed)
Spoke to husband. Husband made aware procedure is still a go for Friday. Aware hospital will call to get her CT r/s for tomorrow/Wednesday. Husband agreeable to plan.

## 2021-06-10 ENCOUNTER — Encounter (HOSPITAL_COMMUNITY): Payer: Self-pay

## 2021-06-10 ENCOUNTER — Ambulatory Visit (HOSPITAL_COMMUNITY)
Admission: RE | Admit: 2021-06-10 | Discharge: 2021-06-10 | Disposition: A | Discharge status: Home / Self Care | Payer: Medicare HMO | Source: Ambulatory Visit | Attending: Cardiology | Admitting: Cardiology

## 2021-06-10 ENCOUNTER — Other Ambulatory Visit: Payer: Self-pay

## 2021-06-10 DIAGNOSIS — I4891 Unspecified atrial fibrillation: Secondary | ICD-10-CM | POA: Insufficient documentation

## 2021-06-10 MED ORDER — IOHEXOL 350 MG/ML SOLN
95.0000 mL | Freq: Once | INTRAVENOUS | Status: AC | PRN
  Administered 2021-06-10: 95 mL via INTRAVENOUS

## 2021-06-12 NOTE — Pre-Procedure Instructions (Signed)
Attempted to call patient regarding procedure instructions Left voice mail on the following items: Arrival time 0530 Nothing to eat or drink after midnight No meds AM of procedure Responsible person to drive you home and stay with you for 24 hrs  Have you missed any doses of anti-coagulant Eliquis- take both doses today    

## 2021-06-13 ENCOUNTER — Other Ambulatory Visit: Payer: Self-pay

## 2021-06-13 ENCOUNTER — Encounter (HOSPITAL_COMMUNITY)
Admission: RE | Disposition: A | Discharge status: Home / Self Care | Payer: Self-pay | Source: Home / Self Care | Attending: Cardiology

## 2021-06-13 ENCOUNTER — Ambulatory Visit (HOSPITAL_COMMUNITY): Payer: Medicare HMO | Admitting: Certified Registered"

## 2021-06-13 ENCOUNTER — Encounter (HOSPITAL_COMMUNITY): Payer: Self-pay | Admitting: Cardiology

## 2021-06-13 ENCOUNTER — Ambulatory Visit (HOSPITAL_COMMUNITY)
Admission: RE | Admit: 2021-06-13 | Discharge: 2021-06-13 | Disposition: A | Discharge status: Home / Self Care | Payer: Medicare HMO | Attending: Cardiology | Admitting: Cardiology

## 2021-06-13 DIAGNOSIS — E785 Hyperlipidemia, unspecified: Secondary | ICD-10-CM | POA: Diagnosis not present

## 2021-06-13 DIAGNOSIS — I10 Essential (primary) hypertension: Secondary | ICD-10-CM | POA: Insufficient documentation

## 2021-06-13 DIAGNOSIS — Z87891 Personal history of nicotine dependence: Secondary | ICD-10-CM | POA: Diagnosis not present

## 2021-06-13 DIAGNOSIS — I4892 Unspecified atrial flutter: Secondary | ICD-10-CM | POA: Diagnosis not present

## 2021-06-13 DIAGNOSIS — I480 Paroxysmal atrial fibrillation: Secondary | ICD-10-CM | POA: Diagnosis not present

## 2021-06-13 DIAGNOSIS — I4891 Unspecified atrial fibrillation: Secondary | ICD-10-CM | POA: Diagnosis not present

## 2021-06-13 DIAGNOSIS — Z888 Allergy status to other drugs, medicaments and biological substances status: Secondary | ICD-10-CM | POA: Diagnosis not present

## 2021-06-13 DIAGNOSIS — I483 Typical atrial flutter: Secondary | ICD-10-CM | POA: Diagnosis not present

## 2021-06-13 DIAGNOSIS — J302 Other seasonal allergic rhinitis: Secondary | ICD-10-CM | POA: Diagnosis not present

## 2021-06-13 DIAGNOSIS — E7800 Pure hypercholesterolemia, unspecified: Secondary | ICD-10-CM | POA: Diagnosis not present

## 2021-06-13 HISTORY — PX: ATRIAL FIBRILLATION ABLATION: EP1191

## 2021-06-13 LAB — POCT ACTIVATED CLOTTING TIME
Activated Clotting Time: 387 seconds
Activated Clotting Time: 352 seconds

## 2021-06-13 SURGERY — ATRIAL FIBRILLATION ABLATION
Anesthesia: General

## 2021-06-13 MED ORDER — OXYCODONE HCL 5 MG PO TABS
5.0000 mg | ORAL_TABLET | Freq: Once | ORAL | Status: AC
  Administered 2021-06-13: 5 mg via ORAL
  Filled 2021-06-13: qty 1

## 2021-06-13 MED ORDER — ACETAMINOPHEN 325 MG PO TABS
650.0000 mg | ORAL_TABLET | ORAL | Status: DC | PRN
  Administered 2021-06-13: 650 mg via ORAL
  Filled 2021-06-13 (×3): qty 2

## 2021-06-13 MED ORDER — HEPARIN (PORCINE) IN NACL 1000-0.9 UT/500ML-% IV SOLN
INTRAVENOUS | Status: DC | PRN
  Administered 2021-06-13 (×4): 500 mL

## 2021-06-13 MED ORDER — SUGAMMADEX SODIUM 200 MG/2ML IV SOLN
INTRAVENOUS | Status: DC | PRN
  Administered 2021-06-13: 200 mg via INTRAVENOUS

## 2021-06-13 MED ORDER — SODIUM CHLORIDE 0.9 % IV SOLN: 250.0000 mL | INTRAVENOUS | Status: DC | PRN

## 2021-06-13 MED ORDER — HEPARIN SODIUM (PORCINE) 1000 UNIT/ML IJ SOLN
INTRAMUSCULAR | Status: AC
  Filled 2021-06-13: qty 1

## 2021-06-13 MED ORDER — ONDANSETRON HCL 4 MG/2ML IJ SOLN
INTRAMUSCULAR | Status: DC | PRN
  Administered 2021-06-13: 4 mg via INTRAVENOUS

## 2021-06-13 MED ORDER — PROPOFOL 10 MG/ML IV BOLUS
INTRAVENOUS | Status: DC | PRN
  Administered 2021-06-13: 150 mg via INTRAVENOUS

## 2021-06-13 MED ORDER — FENTANYL CITRATE (PF) 100 MCG/2ML IJ SOLN
INTRAMUSCULAR | Status: DC | PRN
  Administered 2021-06-13 (×2): 50 ug via INTRAVENOUS

## 2021-06-13 MED ORDER — PROTAMINE SULFATE 10 MG/ML IV SOLN
INTRAVENOUS | Status: DC | PRN
  Administered 2021-06-13: 40 mg via INTRAVENOUS

## 2021-06-13 MED ORDER — SODIUM CHLORIDE 0.9% FLUSH: 3.0000 mL | Freq: Two times a day (BID) | INTRAVENOUS | Status: DC

## 2021-06-13 MED ORDER — OXYCODONE HCL 5 MG PO TABS
ORAL_TABLET | ORAL | Status: AC
  Filled 2021-06-13: qty 1

## 2021-06-13 MED ORDER — DOBUTAMINE INFUSION FOR EP/ECHO/NUC (1000 MCG/ML)
INTRAVENOUS | Status: DC | PRN
  Administered 2021-06-13: 20 ug/kg/min via INTRAVENOUS

## 2021-06-13 MED ORDER — MIDAZOLAM HCL 5 MG/5ML IJ SOLN
INTRAMUSCULAR | Status: DC | PRN
  Administered 2021-06-13: 2 mg via INTRAVENOUS

## 2021-06-13 MED ORDER — LIDOCAINE 2% (20 MG/ML) 5 ML SYRINGE
INTRAMUSCULAR | Status: DC | PRN
  Administered 2021-06-13: 80 mg via INTRAVENOUS

## 2021-06-13 MED ORDER — HEPARIN SODIUM (PORCINE) 1000 UNIT/ML IJ SOLN
INTRAMUSCULAR | Status: DC | PRN
  Administered 2021-06-13: 14000 [IU] via INTRAVENOUS

## 2021-06-13 MED ORDER — ROCURONIUM BROMIDE 100 MG/10ML IV SOLN
INTRAVENOUS | Status: DC | PRN
  Administered 2021-06-13: 80 mg via INTRAVENOUS

## 2021-06-13 MED ORDER — HEPARIN SODIUM (PORCINE) 1000 UNIT/ML IJ SOLN
INTRAMUSCULAR | Status: DC | PRN
  Administered 2021-06-13: 1000 [IU] via INTRAVENOUS

## 2021-06-13 MED ORDER — DOBUTAMINE INFUSION FOR EP/ECHO/NUC (1000 MCG/ML)
INTRAVENOUS | Status: AC
  Filled 2021-06-13: qty 250

## 2021-06-13 MED ORDER — ONDANSETRON HCL 4 MG/2ML IJ SOLN: 4.0000 mg | Freq: Four times a day (QID) | INTRAMUSCULAR | Status: DC | PRN

## 2021-06-13 MED ORDER — SODIUM CHLORIDE 0.9 % IV SOLN: INTRAVENOUS | Status: DC

## 2021-06-13 MED ORDER — APIXABAN 5 MG PO TABS
5.0000 mg | ORAL_TABLET | Freq: Once | ORAL | Status: AC
  Administered 2021-06-13: 5 mg via ORAL
  Filled 2021-06-13: qty 1

## 2021-06-13 MED ORDER — SODIUM CHLORIDE 0.9% FLUSH: 3.0000 mL | INTRAVENOUS | Status: DC | PRN

## 2021-06-13 SURGICAL SUPPLY — 20 items
BAG SNAP BAND KOVER 36X36 (MISCELLANEOUS) ×1 IMPLANT
BLANKET WARM UNDERBOD FULL ACC (MISCELLANEOUS) ×2 IMPLANT
CATH OCTARAY 1.5 F (CATHETERS) ×1 IMPLANT
CATH S CIRCA THERM PROBE 10F (CATHETERS) ×1 IMPLANT
CATH SMTCH THERMOCOOL SF DF (CATHETERS) ×1 IMPLANT
CATH SOUNDSTAR ECO 8FR (CATHETERS) ×1 IMPLANT
CATH WEB BI DIR CSDF CRV REPRO (CATHETERS) ×1 IMPLANT
CLOSURE PERCLOSE PROSTYLE (VASCULAR PRODUCTS) ×4 IMPLANT
COVER SWIFTLINK CONNECTOR (BAG) ×2 IMPLANT
KIT VERSACROSS STEERABLE D1 (CATHETERS) ×1 IMPLANT
MAT PREVALON FULL STRYKER (MISCELLANEOUS) ×1 IMPLANT
PACK EP LATEX FREE (CUSTOM PROCEDURE TRAY) ×2
PACK EP LF (CUSTOM PROCEDURE TRAY) ×1 IMPLANT
PAD PRO RADIOLUCENT 2001M-C (PAD) ×2 IMPLANT
PATCH CARTO3 (PAD) ×1 IMPLANT
SHEATH CARTO VIZIGO SM CVD (SHEATH) ×2 IMPLANT
SHEATH PINNACLE 7F 10CM (SHEATH) ×1 IMPLANT
SHEATH PINNACLE 8F 10CM (SHEATH) ×2 IMPLANT
SHEATH PINNACLE 9F 10CM (SHEATH) ×1 IMPLANT
TUBING SMART ABLATE COOLFLOW (TUBING) ×1 IMPLANT

## 2021-06-13 NOTE — Discharge Instructions (Signed)
Post procedure care instructions No driving for 4 days. No lifting over 5 lbs for 1 week. No vigorous or sexual activity for 1 week. You may return to work/your usual activities on 06/20/21. Keep procedure site clean & dry. If you notice increased pain, swelling, bleeding or pus, call/return!  You may shower after 24 hours, but no soaking in baths/hot tubs/pools for 1 week.    You have an appointment set up with the Atrial Fibrillation Clinic.  Multiple studies have shown that being followed by a dedicated atrial fibrillation clinic in addition to the standard care you receive from your other physicians improves health. We believe that enrollment in the atrial fibrillation clinic will allow [place] to better care for you.   The phone number to the Atrial Fibrillation Clinic is [phone]. The clinic is staffed Monday through Friday from 8:30am to 5pm.  Parking Directions: The clinic is located in the Heart and Vascular Building connected to [facility]. 1)From [address] turn on to [address] and go to the 3rd entrance  (Heart and Vascular entrance) on the right. 2)Look to the right for Heart &Vascular Parking Garage. 3)A code for the entrance is required, for November is 4444.   4)Take the elevators to the 1st floor. Registration is in the room with the glass walls at the end of the hallway.  If you have any trouble parking or locating the clinic, please don't hesitate to call [phone].

## 2021-06-13 NOTE — Anesthesia Procedure Notes (Signed)
Procedure Name: Intubation Date/Time: 06/13/2021 7:43 AM Performed by: Marny Lowenstein, CRNA Pre-anesthesia Checklist: Patient identified, Emergency Drugs available, Suction available and Patient being monitored Patient Re-evaluated:Patient Re-evaluated prior to induction Oxygen Delivery Method: Circle system utilized Preoxygenation: Pre-oxygenation with 100% oxygen Induction Type: IV induction Ventilation: Mask ventilation without difficulty Laryngoscope Size: Miller and 2 Grade View: Grade II Tube type: Oral Tube size: 7.0 mm Number of attempts: 1 Airway Equipment and Method: Patient positioned with wedge pillow and Stylet Placement Confirmation: ETT inserted through vocal cords under direct vision, positive ETCO2 and breath sounds checked- equal and bilateral Secured at: 21 cm Tube secured with: Tape Dental Injury: Teeth and Oropharynx as per pre-operative assessment

## 2021-06-13 NOTE — Transfer of Care (Signed)
Immediate Anesthesia Transfer of Care Note  Patient: Kendra Nelson  Procedure(s) Performed: ATRIAL FIBRILLATION ABLATION  Patient Location: PACU  Anesthesia Type:General  Level of Consciousness: awake and patient cooperative  Airway & Oxygen Therapy: Patient Spontanous Breathing and Patient connected to nasal cannula oxygen  Post-op Assessment: Report given to RN and Post -op Vital signs reviewed and stable  Post vital signs: Reviewed and stable  Last Vitals:  Vitals Value Taken Time  BP 173/72 06/13/21 0950  Temp 36.8 C 06/13/21 0944  Pulse 74 06/13/21 0951  Resp 25 06/13/21 0951  SpO2 94 % 06/13/21 0951  Vitals shown include unvalidated device data.  Last Pain:  Vitals:   06/13/21 0944  TempSrc: Temporal  PainSc: 0-No pain         Complications: There were no known notable events for this encounter.

## 2021-06-13 NOTE — H&P (Signed)
Electrophysiology Office Note   Date:  06/13/2021   ID:  Kendra Nelson, DOB September 15, 1960, MRN [id number]  PCP:  Iona Hansen, NP  Cardiologist:  Jacques Navy Primary Electrophysiologist:  [name], MD   Chief Complaint: AF   History of Present Illness: Kendra Nelson is a 60 y.o. female who is being seen today for the evaluation of AF at the request of No ref. provider found. Presenting today for electrophysiology evaluation.  She has a history significant for hypertension, hyperlipidemia, chronic back pain status post spinal cord stimulator, and atrial fibrillation replacement April 2020 that she did presented to PACU a narrow complex tachycardia heart rates 200 wide-complex tachycardia.  ECG showed rapid atrial fibrillation and she was started on amiodarone.  She converted to sinus rhythm.  She wore a subsequent 30-day monitor that showed both atrial fibrillation and atrial flutter as well as wide-complex tachycardia during atrial fibrillation.  Today, denies symptoms of palpitations, chest pain, shortness of breath, orthopnea, PND, lower extremity edema, claudication, dizziness, presyncope, syncope, bleeding, or neurologic sequela. The patient is tolerating medications without difficulties. AF ablaiton planned today.   Past Medical History:  Diagnosis Date   Anxiety    off of valium- stress   Arthritis    left knee- rhumatoid- hasnt seen specialist    Chronic lower back pain    Complication of anesthesia    WOKE DURING BACK SURGERY ; urinary retention 12/20/2017   Hyperlipidemia    Hypertension    Migraine    "when seasons change" (12/20/2017)   Neuromuscular disorder (HCC)    Restless leg syndrome    Seasonal allergies    Skin abnormalities    Wears glasses    Past Surgical History:  Procedure Laterality Date   BACK SURGERY     8-9 SURGERIES; lower back   COLONOSCOPY     INTRATHECAL PUMP REVISION N/A 05/09/2019   Procedure: Intrathecal pump change;   Surgeon: Odette Fraction, MD;  Location: [facility] OR;  Service: Neurosurgery;  Laterality: N/A;  Intrathecal pump change   JOINT REPLACEMENT     PAIN PUMP IMPLANTATION  05/10/2012   Procedure: PAIN PUMP INSERTION;  Surgeon: Maeola Harman, MD;  Location: [facility];  Service: Neurosurgery;  Laterality: N/A;  Pump replacement   SPINAL CORD STIMULATOR BATTERY EXCHANGE N/A 06/09/2016   Procedure: IMPLANTABLE PULSE GENERATOR REPLACEMENT WITH RECHARGEABLE BATTERY;  Surgeon: Maeola Harman, MD;  Location: [facility] OR;  Service: Neurosurgery;  Laterality: N/A;   TOTAL KNEE ARTHROPLASTY Left 12/20/2017   TOTAL KNEE ARTHROPLASTY Left 12/20/2017   Procedure: TOTAL KNEE ARTHROPLASTY;  Surgeon: Gean Birchwood, MD;  Location: [facility] OR;  Service: Orthopedics;  Laterality: Left;   TOTAL KNEE REVISION Left 12/19/2020   Procedure: TOTAL KNEE REVISION/ Total Knee Polynethylene Barring, Attune Total Knee;  Surgeon: Gean Birchwood, MD;  Location: [facility] ORS;  Service: Orthopedics;  Laterality: Left;   VAGINAL HYSTERECTOMY       Current Facility-Administered Medications  Medication Dose Route Frequency Provider Last Rate Last Admin   0.9 %  sodium chloride infusion   Intravenous Continuous Regan Lemming, MD 50 mL/hr at 06/13/21 0643 New Bag at 06/13/21 0643    Allergies:   Benadryl [diphenhydramine hcl], Bee venom, and Dexamethasone   Social History:  The patient  reports that she quit smoking about 2 years ago. Her smoking use included cigarettes. She has a 30.00 pack-year smoking history. She has never used smokeless tobacco. She reports current drug use. Drug: Oxycodone. She reports  that she does not drink alcohol.   Family History:  The patient's family history includes Aortic aneurysm in her mother; Diabetes in her maternal grandmother.   ROS:  Please see the history of present illness.   Otherwise, review of systems is positive for none.   All other systems are reviewed and negative.   PHYSICAL EXAM: VS:  BP (!) 215/82    Pulse 60   Temp 98.9 F (37.2 C) (Oral)   Resp 16   Ht 5\' 3"  (1.6 m)   Wt 79.4 kg   SpO2 99%   BMI 31.00 kg/m  , BMI Body mass index is 31 kg/m. GEN: Well nourished, well developed, in no acute distress  HEENT: normal  Neck: no JVD, carotid bruits, or masses Cardiac: RRR; no murmurs, rubs, or gallops,no edema  Respiratory:  clear to auscultation bilaterally, normal work of breathing GI: soft, nontender, nondistended, + BS MS: no deformity or atrophy  Skin: warm and dry Neuro:  Strength and sensation are intact Psych: euthymic mood, full affect   Recent Labs: 12/19/2020: TSH 0.705 12/20/2020: Magnesium 2.1 06/06/2021: ALT 29; BUN 12; Creatinine, Ser 0.80; Hemoglobin 15.9; Platelets 209; Potassium 4.1; Sodium 140    Lipid Panel  No results found for: CHOL, TRIG, HDL, CHOLHDL, VLDL, LDLCALC, LDLDIRECT   Wt Readings from Last 3 Encounters:  06/13/21 79.4 kg  06/06/21 72.6 kg  05/15/21 74.8 kg      Other studies Reviewed: Additional studies/ records that were reviewed today include: TTE 12/20/20  Review of the above records today demonstrates:   1. Left ventricular ejection fraction, by estimation, is 60 to 65%. The  left ventricle has normal function. The left ventricle has no regional  wall motion abnormalities. Left ventricular diastolic parameters are  consistent with Grade II diastolic  dysfunction (pseudonormalization).   2. Right ventricular systolic function is normal. The right ventricular  size is normal.   3. Left atrial size was moderately dilated.   4. The mitral valve is normal in structure. Trivial mitral valve  regurgitation. No evidence of mitral stenosis.   5. The aortic valve is normal in structure. Aortic valve regurgitation is  not visualized. No aortic stenosis is present.   6. The inferior vena cava is dilated in size with >50% respiratory  variability, suggesting right atrial pressure of 8 mmHg.   Myoview 01/17/21 The left ventricular ejection  fraction is normal (55-65%). Nuclear stress EF: 55%. No T wave inversion was noted during stress. There was no ST segment deviation noted during stress. This is a low risk study.  ASSESSMENT AND PLAN:  1.  Paroxysmal atrial fibrillation/flutter: Kendra Nelson has presented today for surgery, with the diagnosis of atrial fibrillation.  The various methods of treatment have been discussed with the patient and family. After consideration of risks, benefits and other options for treatment, the patient has consented to  Procedure(s): Catheter ablation as a surgical intervention .  Risks include but not limited to complete heart block, stroke, esophageal damage, nerve damage, bleeding, vascular damage, tamponade, perforation, MI, and death. The patient's history has been reviewed, patient examined, no change in status, stable for surgery.  I have reviewed the patient's chart and labs.  Questions were answered to the patient's satisfaction.    Yalitza Teed [name], MD 06/13/2021 7:12 AM

## 2021-06-13 NOTE — Progress Notes (Signed)
Patient stated that she was having chronic pain in her back. R and L groin assessed and WDL. Elberta Fortis, MD notified. Verbal orders for Oxycodone IR 5 mg PO once. Orders placed and followed. Will continue to monitor.

## 2021-06-13 NOTE — Anesthesia Preprocedure Evaluation (Signed)
Anesthesia Evaluation  Patient identified by MRN, date of birth, ID band Patient awake    Reviewed: Allergy & Precautions, NPO status , Patient's Chart, lab work & pertinent test results  History of Anesthesia Complications Negative for: history of anesthetic complications  Airway Mallampati: II  TM Distance: >3 FB Neck ROM: Full    Dental  (+) Teeth Intact, Dental Advisory Given   Pulmonary neg pulmonary ROS, Patient abstained from smoking., former smoker,    Pulmonary exam normal        Cardiovascular hypertension, Pt. 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 contin use    GI/Hepatic negative GI ROS,   Endo/Other  negative endocrine ROS  Renal/GU negative Renal ROS  negative genitourinary   Musculoskeletal  (+) Arthritis ,   Abdominal   Peds  Hematology negative hematology ROS (+)   Anesthesia Other Findings   Reproductive/Obstetrics                             Anesthesia Physical  Anesthesia Plan  ASA: 3  Anesthesia Plan: General   Post-op Pain Management:    Induction: Intravenous  PONV Risk Score and Plan: 3 and Ondansetron, Dexamethasone, Midazolam and Treatment may vary due to age or medical condition  Airway Management Planned: Oral ETT  Additional Equipment: None  Intra-op Plan:   Post-operative Plan: Extubation in OR  Informed Consent: I have reviewed the patients History and Physical, chart, labs and discussed the procedure including the risks, benefits and alternatives for the proposed anesthesia with the patient or authorized representative who has indicated his/her understanding and acceptance.     Dental advisory given  Plan Discussed with:   Anesthesia Plan Comments: ( )        Anesthesia Quick  Evaluation

## 2021-06-13 NOTE — Anesthesia Postprocedure Evaluation (Signed)
Anesthesia Post Note  Patient: Kendra Nelson  Procedure(s) Performed: ATRIAL FIBRILLATION ABLATION     Patient location during evaluation: PACU Anesthesia Type: General Level of consciousness: awake and alert Pain management: pain level controlled Vital Signs Assessment: post-procedure vital signs reviewed and stable Respiratory status: spontaneous breathing, nonlabored ventilation and respiratory function stable Cardiovascular status: blood pressure returned to baseline and stable Postop Assessment: no apparent nausea or vomiting Anesthetic complications: no   There were no known notable events for this encounter.  Last Vitals:  Vitals:   06/13/21 1100 06/13/21 1115  BP: (!) 160/90 (!) 162/86  Pulse: 72 65  Resp: 12 10  Temp:    SpO2: 93% 92%    Last Pain:  Vitals:   06/13/21 1030  TempSrc:   PainSc: 0-No pain                 Lowella Curb

## 2021-06-25 ENCOUNTER — Telehealth: Payer: Self-pay | Admitting: Cardiology

## 2021-06-25 NOTE — Telephone Encounter (Signed)
  Pt c/o of Chest Pain: STAT if CP now or developed within 24 hours  1. Are you having CP right now? No   2. Are you experiencing any other symptoms (ex. SOB, nausea, vomiting, sweating)?   3. How long have you been experiencing CP? 4 days after ablation  4. Is your CP continuous or coming and going? Coming and going   5. Have you taken Nitroglycerin? No    Pt said 4 days after her ablation she started feeling Chest spasm and its painful, she said she felt it 4 times now. She denied SOB. She wanted to know if this is a normal feeling after an ablation  ?

## 2021-06-25 NOTE — Telephone Encounter (Signed)
Seems like  it is under my chest Feels like a spasm When first happened it was painful, lasted about 15-20 sec Just sitting Very painful  Started 4 days after surgery No sob No pain on   Felt like it went on the left side 153/72 Pulse 45  Not occurring daily, except for the last 60 days 60 year old bday weekend

## 2021-06-26 ENCOUNTER — Telehealth: Payer: Self-pay | Admitting: Cardiology

## 2021-06-26 NOTE — Telephone Encounter (Signed)
Pt called in reporting intermittent chest pain (spasm-like) in nature for the past 4 days. Symptoms were much worse a few days ago, but improved. Called in with symptoms yesterday but has received callback yet to follow up. She has had no chest pain today. Breathing is ok. Otherwise doing well after her ablation. Advised ok to wait for callback from the office in the morning if she has not had any recurrent chest pain today. If develops recurrent pain needs to seek out medical attention tonight. She voiced understanding and thanked me for follow up call.

## 2021-06-27 NOTE — Telephone Encounter (Signed)
Followed up with pt who reports no occurrences in the last 2 days. Currently feeling ok. She is mostly concerned about being able to do a lot of walking this evening for fear issues could arise. Pt advised she has no limitations from a post procedure standpoint. Informed that she could be on the mend since not experiencing further issues since we spoke last. Advised to continue monitoring for now, if reoccurs to call back even if after hours. Explained that since not having daily/worsening symptoms then monitoring best. But stressed about calling back if reoccurs/worsens/changes in nature. Patient verbalized understanding and agreeable to plan.

## 2021-07-03 ENCOUNTER — Telehealth: Payer: Self-pay | Admitting: *Deleted

## 2021-07-03 NOTE — Telephone Encounter (Signed)
Pt calls in because she is in the doughnut hole and cannot afford her Eliquis. Reports that she has been taking it once daily the last couple days b/c she does not have enough medicaiton and cannot afford to purchase 30 more days. Eliquis patient assistance foundation phone number given to pt.    Advised her to call immediately to get this started. 2 weeks of samples left at front desk for pt to pick up. Patient verbalized understanding and agreeable to plan.   Will forward to [facility] for her [place]

## 2021-07-04 NOTE — Telephone Encounter (Signed)
**Note De-Identified Blondina Coderre Obfuscation** No answer so I left a message on the pts VM (Ok per [facility]) asking her to call Larita Fife at Dr Gershon Crane office at [facility] at [phone] if she has any questions, concerns, or needs assistance with applying for asst for Eliquis through [employer].

## 2021-07-08 ENCOUNTER — Telehealth: Payer: Self-pay | Admitting: Cardiology

## 2021-07-08 NOTE — Telephone Encounter (Signed)
Pt c/o BP issue: STAT if pt c/o blurred vision, one-sided weakness or slurred speech  1. What are your last 5 BP readings? 158/145 hr 113, 144/116 hr110, 142/119 hr 110, 80/65 hr 166  2. Are you having any other symptoms (ex. Dizziness, headache, blurred vision, passed out)? Yesterday but not today  3. What is your BP issue? Pts bp is high

## 2021-07-08 NOTE — Telephone Encounter (Signed)
Attempted phone call to pt.  Left voicemail message to contact office at [phone]. ?

## 2021-07-10 ENCOUNTER — Other Ambulatory Visit: Payer: Self-pay

## 2021-07-10 MED ORDER — PAIN MANAGEMENT IT PUMP REFILL: 1.0000 | Freq: Once | INTRATHECAL | 0 refills | Status: AC

## 2021-07-10 NOTE — Telephone Encounter (Signed)
Followed up with pt who reports issues since Monday. States it subsided Tuesday and then started back up Wednesday. Chest discomfort/pain, almost spasm like.  States today is like a lingering "residue" of the pain. Denies any radiating pain. She is having a hard time breathing, SOB just walking around the house.    Experiencing some light headedness/dizziness, and states she thought she was coming close to passing out recently when standing up. She has taken 4 PRN Lopressors in the last 10 days d/t HRs. She reports BPs as  139/102 HR 81  130/107, HR 116 166/52    132/11, HR 68 111/93, HR 64 Pt scheduled for routine post ablation follow up in the AFib clinic tomorrow and advised to take her BP cuff with her for check. Advised if chest pain worsens overnight/changes in nature and/or radiating pain begins to call 911 or get to nearest emergency room. Aware that sounds like this is AFib related, but hard to say for sure and evaluation needed. Patient verbalized understanding and agreeable to plan.

## 2021-07-11 ENCOUNTER — Other Ambulatory Visit: Payer: Self-pay

## 2021-07-11 ENCOUNTER — Ambulatory Visit (HOSPITAL_COMMUNITY)
Admission: RE | Admit: 2021-07-11 | Discharge: 2021-07-11 | Disposition: A | Discharge status: Home / Self Care | Payer: Medicare HMO | Source: Ambulatory Visit | Attending: Physician Assistant | Admitting: Physician Assistant

## 2021-07-11 ENCOUNTER — Encounter (HOSPITAL_COMMUNITY): Payer: Self-pay | Admitting: Physician Assistant

## 2021-07-11 VITALS — BP 130/72 | HR 66 | Ht 63.0 in | Wt 183.6 lb

## 2021-07-11 DIAGNOSIS — Z87891 Personal history of nicotine dependence: Secondary | ICD-10-CM | POA: Diagnosis not present

## 2021-07-11 DIAGNOSIS — E785 Hyperlipidemia, unspecified: Secondary | ICD-10-CM | POA: Insufficient documentation

## 2021-07-11 DIAGNOSIS — M549 Dorsalgia, unspecified: Secondary | ICD-10-CM | POA: Insufficient documentation

## 2021-07-11 DIAGNOSIS — G8929 Other chronic pain: Secondary | ICD-10-CM | POA: Insufficient documentation

## 2021-07-11 DIAGNOSIS — R079 Chest pain, unspecified: Secondary | ICD-10-CM | POA: Insufficient documentation

## 2021-07-11 DIAGNOSIS — Z7901 Long term (current) use of anticoagulants: Secondary | ICD-10-CM | POA: Insufficient documentation

## 2021-07-11 DIAGNOSIS — I480 Paroxysmal atrial fibrillation: Secondary | ICD-10-CM | POA: Insufficient documentation

## 2021-07-11 DIAGNOSIS — Z9682 Presence of neurostimulator: Secondary | ICD-10-CM | POA: Diagnosis not present

## 2021-07-11 DIAGNOSIS — I10 Essential (primary) hypertension: Secondary | ICD-10-CM | POA: Diagnosis not present

## 2021-07-11 DIAGNOSIS — Z79899 Other long term (current) drug therapy: Secondary | ICD-10-CM | POA: Insufficient documentation

## 2021-07-11 DIAGNOSIS — R109 Unspecified abdominal pain: Secondary | ICD-10-CM | POA: Diagnosis not present

## 2021-07-11 MED ORDER — FUROSEMIDE 20 MG PO TABS: ORAL_TABLET | ORAL | 0 refills | Status: DC

## 2021-07-11 NOTE — Progress Notes (Signed)
Primary Care Physician: Iona Hansen, NP Primary Cardiologist: Dr Jacques Navy Primary Electrophysiologist: Dr Elberta Fortis  Referring Physician: Dr Corine Shelter is a 60 y.o. female with a history of HTN, HLD, chronic back pain s/p spinal stimulator, atrial flutter, atrial fibrillation who presents for follow up in the [facility].  The patient was initially diagnosed with atrial fibrillation 11/2020 post total knee surgery. On arrival to PACU, she had narrow complex tachycardia with heart rate greater than 200 and runs of V. tach.  She was asymptomatic and was given 50 mg of esmolol and IV metoprolol.  EKG confirmed atrial fibrillation with RVR and atrial flutter with 2:1 conduction. Patient was started on amiodarone bolus and infusion which converted her to sinus rhythm. She was discharged on 25 mg metoprolol tartrate as needed for palpitation.  Amiodarone was stopped prior to discharge.  She was placed on a 30-day event monitor which showed afib and atrial flutter. Patient is on Eliquis for a CHADS2VASC score of 2.  On follow up today, patient is s/p afib and flutter ablation with Dr Elberta Fortis on 06/13/21. Patient reports that she has had a few instances of rapid heart rates and labile BP. She is in SR today. She also describes a sharp. Stabbing pain located on her left side/abdomen. This is intermittent and does not radiate. She has noted some weight gain since the ablation and occasional SOB.  Today, she denies symptoms of palpitations, orthopnea, PND, lower extremity edema, dizziness, presyncope, syncope, snoring, daytime somnolence, bleeding, or neurologic sequela. The patient is tolerating medications without difficulties and is otherwise without complaint today.    Atrial Fibrillation Risk Factors:  she does not have symptoms or diagnosis of sleep apnea. she does not have a history of rheumatic fever. she does not have a history of alcohol use.   she  has a BMI of Body mass index is 32.52 kg/m.Marland Kitchen Filed Weights   07/11/21 1032  Weight: 83.3 kg    Family History  Problem Relation Age of Onset   Aortic aneurysm Mother    Diabetes Maternal Grandmother      Atrial Fibrillation Management history:  Previous antiarrhythmic drugs: amiodarone  Previous cardioversions: none Previous ablations: 06/13/21 CHADS2VASC score: 2 Anticoagulation history: Eliquis   Past Medical History:  Diagnosis Date   Anxiety    off of valium- stress   Arthritis    left knee- rhumatoid- hasnt seen specialist    Chronic lower back pain    Complication of anesthesia    WOKE DURING BACK SURGERY ; urinary retention 12/20/2017   Hyperlipidemia    Hypertension    Migraine    "when seasons change" (12/20/2017)   Neuromuscular disorder (HCC)    Restless leg syndrome    Seasonal allergies    Skin abnormalities    Wears glasses    Past Surgical History:  Procedure Laterality Date   ATRIAL FIBRILLATION ABLATION N/A 06/13/2021   Procedure: ATRIAL FIBRILLATION ABLATION;  Surgeon: Regan Lemming, MD;  Location: [facility];  Service: Cardiovascular;  Laterality: N/A;   BACK SURGERY     8-9 SURGERIES; lower back   COLONOSCOPY     INTRATHECAL PUMP REVISION N/A 05/09/2019   Procedure: Intrathecal pump change;  Surgeon: Odette Fraction, MD;  Location: [facility];  Service: Neurosurgery;  Laterality: N/A;  Intrathecal pump change   JOINT REPLACEMENT     PAIN PUMP IMPLANTATION  05/10/2012   Procedure: PAIN PUMP INSERTION;  Surgeon: Erline Levine, MD;  Location: [facility];  Service: Neurosurgery;  Laterality: N/A;  Pump replacement   SPINAL CORD STIMULATOR BATTERY EXCHANGE N/A 06/09/2016   Procedure: IMPLANTABLE PULSE GENERATOR REPLACEMENT WITH RECHARGEABLE BATTERY;  Surgeon: Erline Levine, MD;  Location: [facility];  Service: Neurosurgery;  Laterality: N/A;   TOTAL KNEE ARTHROPLASTY Left 12/20/2017   TOTAL KNEE ARTHROPLASTY Left 12/20/2017   Procedure: TOTAL KNEE  ARTHROPLASTY;  Surgeon: Frederik Pear, MD;  Location: [place];  Service: Orthopedics;  Laterality: Left;   TOTAL KNEE REVISION Left 12/19/2020   Procedure: TOTAL KNEE REVISION/ Total Knee Polynethylene Barring, Attune Total Knee;  Surgeon: Frederik Pear, MD;  Location: [facility];  Service: Orthopedics;  Laterality: Left;   VAGINAL HYSTERECTOMY      Current Outpatient Medications  Medication Sig Dispense Refill   AMBULATORY NON FORMULARY MEDICATION Medication Name:  Intrathecal pump Fentanyl 1,000.0 mcg/ml Bupivacaine 20.0 mg/ml 40 ml pump Rate 357.2 mcg/day     apixaban (ELIQUIS) 5 MG TABS tablet Take 1 tablet (5 mg total) by mouth 2 (two) times daily. 60 tablet 11   atorvastatin (LIPITOR) 20 MG tablet Take 20 mg by mouth every morning.      Biotin w/ Vitamins C & E (HAIR/SKIN/NAILS PO) Take 3 tablets by mouth daily.     Calcium Citrate-Vitamin D (CALCIUM + D PO) Take 1 tablet by mouth daily.     lisinopril (ZESTRIL) 20 MG tablet Take 2 tablets (40 mg total) by mouth every morning. 30 tablet 0   metoprolol succinate (TOPROL-XL) 25 MG 24 hr tablet Take 2 tablets (50 mg total) by mouth daily. 180 tablet 3   metoprolol tartrate (LOPRESSOR) 25 MG tablet Take 25 mg by mouth 2 (two) times daily as needed (palpitations).     naloxone (NARCAN) nasal spray 4 mg/0.1 mL Place 1 spray into the nose as needed for up to 365 doses (for opioid-induced respiratory depresssion). In case of emergency (overdose), spray once into each nostril. If no response within 3 minutes, repeat application and call [id number]. 1 each 0   rOPINIRole (REQUIP) 0.5 MG tablet Take 1 mg by mouth at bedtime as needed (restless legs).     morphine (MS CONTIN) 30 MG 12 hr tablet Take 1 tablet (30 mg total) by mouth every 12 (twelve) hours. Must last 30 days. Do not break tablet 60 tablet 0   No current facility-administered medications for this encounter.    Allergies  Allergen Reactions   Benadryl [Diphenhydramine Hcl] Hives and Rash   Bee  Venom Other (See Comments)    Lightheaded, feverish, shortness of breath, flu like symptoms     Dexamethasone Other (See Comments)    Systemic burning sensation with IV formulation.    Social History   Socioeconomic History   Marital status: Married    Spouse name: Not on file   Number of children: Not on file   Years of education: Not on file   Highest education level: Not on file  Occupational History   Not on file  Tobacco Use   Smoking status: Former    Packs/day: 1.00    Years: 30.00    Pack years: 30.00    Types: Cigarettes    Quit date: 11/12/2018    Years since quitting: 2.6   Smokeless tobacco: Never   Tobacco comments:    Former smoker 07/11/2021  Vaping Use   Vaping Use: Former  Substance and Sexual Activity   Alcohol use: No   Drug use:  Yes    Types: Oxycodone   Sexual activity: Not Currently  Other Topics Concern   Not on file  Social History Narrative   Not on file   Social Determinants of Health   Financial Resource Strain: Not on file  Food Insecurity: Not on file  Transportation Needs: Not on file  Physical Activity: Not on file  Stress: Not on file  Social Connections: Not on file  Intimate Partner Violence: Not on file     ROS- All systems are reviewed and negative except as per the HPI above.  Physical Exam: Vitals:   07/11/21 1032  BP: 130/72  Pulse: 66  Weight: 83.3 kg  Height: 5\' 3"  (1.6 m)    GEN- The patient is a well appearing obese female, alert and oriented x 3 today.   Head- normocephalic, atraumatic Eyes-  Sclera clear, conjunctiva pink Ears- hearing intact Oropharynx- clear Neck- supple  Lungs- Clear to ausculation bilaterally, normal work of breathing Heart- Regular rate and rhythm, occasional ectopic beat, no murmurs, rubs or gallops  GI- soft, NT, ND, + BS Extremities- no clubbing, cyanosis, or edema MS- no significant deformity or atrophy Skin- no rash or lesion Psych- euthymic mood, full affect Neuro-  strength and sensation are intact  Wt Readings from Last 3 Encounters:  07/11/21 83.3 kg  06/13/21 79.4 kg  06/06/21 72.6 kg    EKG today demonstrates  SR, PVCs Vent. rate 66 BPM PR interval 136 ms QRS duration 84 ms QT/QTcB 432/452 ms  Echo 12/20/20 demonstrated   1. Left ventricular ejection fraction, by estimation, is 60 to 65%. The  left ventricle has normal function. The left ventricle has no regional  wall motion abnormalities. Left ventricular diastolic parameters are  consistent with Grade II diastolic dysfunction (pseudonormalization).   2. Right ventricular systolic function is normal. The right ventricular  size is normal.   3. Left atrial size was moderately dilated.   4. The mitral valve is normal in structure. Trivial mitral valve  regurgitation. No evidence of mitral stenosis.   5. The aortic valve is normal in structure. Aortic valve regurgitation is  not visualized. No aortic stenosis is present.   6. The inferior vena cava is dilated in size with >50% respiratory  variability, suggesting right atrial pressure of 8 mmHg.   Epic records are reviewed at length today  CHA2DS2-VASc Score = 2  The patient's score is based upon: CHF History: 0 HTN History: 1 Diabetes History: 0 Stroke History: 0 Vascular Disease History: 0 Age Score: 0 Gender Score: 1       ASSESSMENT AND PLAN: 1. Paroxysmal Atrial Fibrillation/atrial flutter The patient's CHA2DS2-VASc score is 2, indicating a 2.2% annual risk of stroke.   S/p afib and flutter ablation with Dr Curt Bears on 06/13/21 Reassured patient that some breakthrough episodes of afib can take up to 3 months to resolve after ablation.  Continue Eliquis 5 mg BID with no missed doses for at least 3 months post ablation.  Continue Toprol 50 mg daily with Lopressor 25 mg BID PRN for heart racing. Her weight is up since ablation, will do short course of lasix 20 mg daily x 3 days.   2. HTN Stable, no changes today.  3.  Left sided abdominal/chest pain Patient points to area just under her last rib on her left side. Intermittent sharp stabbing pain, does not radiate, recent stress test normal. I think it is unlikely cardiac related.   If it continues, pain need f/u  with PCP.    Follow up with Dr Curt Bears as scheduled.    [facility] [address] [place], [place] [address] [phone] 07/11/2021 10:52 AM

## 2021-07-11 NOTE — Patient Instructions (Signed)
Lasix (furosemide) 20mg --- once a day for the next 3 days then only as needed weight gain/swelling

## 2021-07-17 ENCOUNTER — Telehealth: Payer: Self-pay

## 2021-07-17 NOTE — Telephone Encounter (Signed)
Paperwork completed and placed in bad with samples.

## 2021-07-17 NOTE — Telephone Encounter (Signed)
**Note De-Identified Kendra Nelson Obfuscation** Per the pts MYCHART message her husband left the providers page of her [employer] application for Eliquis at the office on 11/7 but I did received it.  She wants to pick it up so she can mail or fax her entire application together to [employer].  I advised her that Dr Elberta Fortis will not be in the office until late next week and offered to complete the providers page of a [employer] with Dr Debby Bud ([facility]) info and will leave in the front office her her husband to pick along with 2 weeks of Eliquis 5 mg samples as she states that she is almost out.  She thanked me for our assistance.

## 2021-08-05 ENCOUNTER — Other Ambulatory Visit: Payer: Self-pay

## 2021-08-05 ENCOUNTER — Encounter: Payer: Self-pay | Admitting: Pain Medicine

## 2021-08-05 ENCOUNTER — Telehealth: Payer: Self-pay | Admitting: *Deleted

## 2021-08-05 ENCOUNTER — Ambulatory Visit: Payer: Medicare HMO | Attending: Pain Medicine | Admitting: Pain Medicine

## 2021-08-05 VITALS — BP 189/97 | HR 65 | Temp 97.0°F | Resp 16 | Ht 63.0 in | Wt 175.0 lb

## 2021-08-05 DIAGNOSIS — G8929 Other chronic pain: Secondary | ICD-10-CM

## 2021-08-05 DIAGNOSIS — G894 Chronic pain syndrome: Secondary | ICD-10-CM

## 2021-08-05 DIAGNOSIS — M5442 Lumbago with sciatica, left side: Secondary | ICD-10-CM | POA: Insufficient documentation

## 2021-08-05 DIAGNOSIS — Z91030 Bee allergy status: Secondary | ICD-10-CM | POA: Insufficient documentation

## 2021-08-05 DIAGNOSIS — Z978 Presence of other specified devices: Secondary | ICD-10-CM

## 2021-08-05 DIAGNOSIS — M5441 Lumbago with sciatica, right side: Secondary | ICD-10-CM | POA: Diagnosis not present

## 2021-08-05 DIAGNOSIS — Z888 Allergy status to other drugs, medicaments and biological substances status: Secondary | ICD-10-CM | POA: Diagnosis not present

## 2021-08-05 DIAGNOSIS — Z96652 Presence of left artificial knee joint: Secondary | ICD-10-CM

## 2021-08-05 DIAGNOSIS — M51369 Other intervertebral disc degeneration, lumbar region without mention of lumbar back pain or lower extremity pain: Secondary | ICD-10-CM

## 2021-08-05 DIAGNOSIS — Z79891 Long term (current) use of opiate analgesic: Secondary | ICD-10-CM

## 2021-08-05 DIAGNOSIS — M79604 Pain in right leg: Secondary | ICD-10-CM

## 2021-08-05 DIAGNOSIS — M961 Postlaminectomy syndrome, not elsewhere classified: Secondary | ICD-10-CM | POA: Diagnosis not present

## 2021-08-05 DIAGNOSIS — M5136 Other intervertebral disc degeneration, lumbar region: Secondary | ICD-10-CM

## 2021-08-05 DIAGNOSIS — M47816 Spondylosis without myelopathy or radiculopathy, lumbar region: Secondary | ICD-10-CM

## 2021-08-05 DIAGNOSIS — Z9682 Presence of neurostimulator: Secondary | ICD-10-CM | POA: Diagnosis not present

## 2021-08-05 DIAGNOSIS — Z79899 Other long term (current) drug therapy: Secondary | ICD-10-CM

## 2021-08-05 MED ORDER — MORPHINE SULFATE ER 30 MG PO TBCR: 30.0000 mg | EXTENDED_RELEASE_TABLET | Freq: Two times a day (BID) | ORAL | 0 refills | Status: DC

## 2021-08-05 NOTE — Patient Instructions (Signed)
Opioid Overdose Opioids are drugs that are often used to treat pain. Opioids include illegal drugs, such as heroin, as well as prescription pain medicines, such as codeine, morphine, hydrocodone, and fentanyl. An opioid overdose happens when you take too much of an opioid. An overdose may be intentional or accidental and can happen with any type of opioid. The effects of an overdose can be mild, dangerous, or even deadly. Opioid overdose is a medical emergency. What are the causes? This condition may be caused by: Taking too much of an opioid on purpose. Taking too much of an opioid by accident. Using two or more substances that contain opioids at the same time. Taking an opioid with a substance that affects your heart, breathing, or blood pressure. These include alcohol, tranquilizers, sleeping pills, illegal drugs, and some over-the-counter medicines. This condition may also happen due to an error made by: A health care provider who prescribes a medicine. The pharmacist who fills the prescription. What increases the risk? This condition is more likely in: Children. They may be attracted to colorful pills. Because of a child's small size, even a small amount of a medicine can be dangerous. Older people. They may be taking many different medicines. Older people may have difficulty reading labels or remembering when they last took their medicines. They may also be more sensitive to the effects of opioids. People with chronic medical conditions, especially heart, liver, kidney, or neurological diseases. People who take an opioid for a long period of time. People who take opioids and use illegal drugs, such as heroin, or other substances, such as alcohol. People who: Have a history of drug or alcohol abuse. Have certain mental health conditions. Have a history of previous drug overdoses. People who take opioids that are not prescribed for them. What are the signs or symptoms? Symptoms of this  condition depend on the type of opioid and the amount that was taken. Common symptoms include: Sleepiness or difficulty waking from sleep. Confusion. Slurred speech. Slowed breathing and a slow pulse (bradycardia). Nausea and vomiting. Abnormally small pupils. Signs and symptoms that require emergency treatment include: Cold, clammy, and pale skin. Blue lips and fingernails. Vomiting. Gurgling sounds in the throat. A pulse that is very slow or difficult to detect. Breathing that is very irregular, slow, noisy, or difficult to detect. Inability to respond to speech or be awakened from sleep (stupor). Seizures. How is this diagnosed? This condition is diagnosed based on your symptoms and medical history. It is important to tell your health care provider: About all of the opioids that you took. When you took the opioids. Whether you were drinking alcohol or using marijuana, cocaine, or other drugs. Your health care provider will do a physical exam. This exam may include: Checking and monitoring your heart rate and rhythm, breathing rate, temperature, and blood pressure. Measuring oxygen levels in your blood. Checking for abnormally small pupils. You may also have blood tests or urine tests. You may have X-rays if you are having severe breathing problems. How is this treated? This condition requires immediate medical treatment and hospitalization. Reversing the effects of the opioid is the first step in treatment. If you have a Narcan kit or naloxone, use it right away. Follow your health care provider's instructions. A friend or family member can also help you with this. The rest of your treatment will be given in the hospital intensive care (ICU). Treatment in the hospital may include: Giving salts and minerals (electrolytes) along with fluids through   an IV. ?Inserting a breathing tube (endotracheal tube) in your airway to help you breathe if you cannot breathe on your own or you are in  danger of not being able to breathe on your own. ?Giving oxygen through a small tube under your nose. ?Passing a tube through your nose and into your stomach (nasogastric tube, or NG tube) to empty your stomach. ?Giving medicines that: ?Increase your blood pressure. ?Relieve nausea and vomiting. ?Relieve abdominal pain and cramping. ?Reverse the effects of the opioid (naloxone). ?Monitoring your heart and oxygen levels. ?Ongoing counseling and mental health support if you intentionally overdosed or used an illegal drug. ?Follow these instructions at home: ?Medicines ?Take over-the-counter and prescription medicines only as told by your health care provider. ?Always ask your health care provider about possible side effects and interactions of any new medicine that you start taking. ?Keep a list of all the medicines that you take, including over-the-counter medicines. Bring this list with you to all your medical visits. ?General instructions ?Drink enough fluid to keep your urine pale yellow. ?Keep all follow-up visits. This is important. ?How is this prevented? ?Read the drug inserts that come with your opioid pain medicines. ?Take medicines only as told by your health care provider. Do not take more medicine than you are told. Do not take medicines more frequently than you are told. ?Do not drink alcohol or take sedatives when taking opioids. ?Do not use illegal or recreational drugs, including cocaine, ecstasy, and marijuana. ?Do not take opioid medicines that are not prescribed for you. ?Store all medicines in safety containers that are out of the reach of children. ?Get help if you are struggling with: ?Alcohol or drug use. ?Depression or another mental health problem. ?Thoughts of hurting yourself or another person. ?Keep the phone number of your local poison control center near your phone or in your mobile phone. In the [place] the hotline of the National Poison Control Center is (800) 222-1222. ?If you were  prescribed naloxone, make sure you understand how to take it. ?Contact a health care provider if: ?You need help understanding how to take your pain medicines. ?You feel your medicines are too strong. ?You are concerned that your pain medicines are not working well for your pain. ?You develop new symptoms or side effects when you are taking medicines. ?Get help right away if: ?You or someone else is having symptoms of an opioid overdose. Get help even if you are not sure. ?You have thoughts about hurting yourself or others. ?You have: ?Chest pain. ?Difficulty breathing. ?A loss of consciousness. ?These symptoms may represent a serious problem that is an emergency. Do not wait to see if the symptoms will go away. Get medical help right away. Call your local emergency services (911 in the [place] Do not drive yourself to the hospital. ?If you ever feel like you may hurt yourself or others, or have thoughts about taking your own life, get help right away. You can go to your nearest emergency department or: ?Call your local emergency services (911 in the [place] ?Call a suicide crisis helpline, such as the National Suicide Prevention Lifeline at 1-800-273-8255 or 988 in the [place] This is open 24 hours a day in the [place] ?Text the Crisis Text Line at 741741 (in the [place] ?Summary ?Opioids are drugs that are often used to treat pain. Opioids include illegal drugs, such as heroin, as well as prescription pain medicines. ?An opioid overdose happens when you take too much of an opioid. ?  Overdoses can be intentional or accidental. ?Opioid overdose is very dangerous. It is a life-threatening emergency. ?If you or someone you know is experiencing an opioid overdose, get help right away. ?This information is not intended to replace advice given to you by your health care provider. Make sure you discuss any questions you have with your health care provider. ?Document Revised: 03/12/2021 Document Reviewed: 11/27/2020 ?Elsevier  Patient Education ? 2022 [employer] ? ?

## 2021-08-05 NOTE — Progress Notes (Signed)
Safety precautions to be maintained throughout the outpatient stay will include: orient to surroundings, keep bed in low position, maintain call bell within reach at all times, provide assistance with transfer out of bed and ambulation.  

## 2021-08-05 NOTE — Telephone Encounter (Signed)
Contacted patient to see if she would like to come in sooner than 1300 today for pump fill.  States they can probably be here by 1230, maybe a little sooner.

## 2021-08-05 NOTE — Progress Notes (Signed)
PROVIDER NOTE: Information contained herein reflects review and annotations entered in association with encounter. Interpretation of such information and data should be left to medically-trained personnel. Information provided to patient can be located elsewhere in the medical record under "Patient Instructions". Document created using STT-dictation technology, any transcriptional errors that may result from process are unintentional.    Patient: Kendra Nelson  Service Category: Procedure Provider: Gaspar Cola, MD DOB: [id number] DOS: 08/05/2021 Location: [facility] Facility MRN: [id number] Setting: Ambulatory - outpatient Referring Provider: Berkley Harvey, NP Type: Established Patient Specialty: Interventional Pain Management PCP: Berkley Harvey, NP  Primary Reason for Visit: Interventional Pain Management Treatment. CC: Back Pain (Lumbar bilateral )  Procedure:          Type: Management of Intrathecal Drug Delivery System (IDDS) - Reservoir Refill [phone]). No rate change.  Indications: 1. Chronic low back pain (1ry area of Pain) (Bilateral) (R>L) w/ sciatica (Bilateral)   2. Chronic lower extremity pain (2ry area of Pain) (Bilateral) (R>L)   3. 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 Lumbar facet syndrome (Bilateral) (R>L)   5. DDD (degenerative disc disease), lumbar   6. Chronic pain syndrome   7. Presence of intrathecal pump   8. Presence of neurostimulator   9. Pharmacologic therapy   10. History of total knee replacement (Left)   11. Long term prescription opiate use   12. Chronic use of opiate for therapeutic purpose   13. Encounter for chronic pain management   14. Encounter for medication management    Pain Assessment: Self-Reported Pain Score: 3 /10             Reported level is compatible with observation.         Intrathecal Drug Delivery System (IDDS)  Pump Device:  Manufacturer: Medtronic Model: Synchromed II Model  No.: [id number] Serial No.: [id number] Delivery Route: Intrathecal Type: Programmable  Volume (mL): 40 mL reservoir Priming Volume: n/a  Calibration Constant: 120.0  MRI compatibility: Conditional   Implant Details:  Date: 05/09/2019 Implanter: Clydell Hakim, MD  Contact Information: [facility] Last Revision/Replacement: 05/09/2019 Estimated Replacement Date: May/2027  Implant Site: Abdominal Laterality: Left  Catheter: Manufacturer: Medtronic Model:  (two piece) Model No.: [id number]  Serial No.: n/a  Implanted Length (cm): 38.1  Catheter Volume (mL): 0.214  Tip Location (Level): T6-7 Canal Access Site: n/a  Drug content:  Primary Medication Class: Opioid  Medication: PF-Fentanyl  Concentration: 1,000 mcg/mL   Secondary Medication Class: Local Anesthetic  Medication: PF-Bupivacaine  Concentration: 20.0 mg/mL   Tertiary Medication Class: none   PA parameters (PCA-mode):  Mode: Off (Inactive)  Programming:  Type: Simple continuous.  Medication, Concentration, Infusion Program, & Delivery Rate: For up-to-date details please see most recent scanned programming printout.   Changes:  Medication Change: None at this point Rate Change: No change in rate  Reported side-effects or adverse reactions: None reported  Effectiveness: Described as relatively effective, allowing for increase in activities of daily living (ADL) Clinically meaningful improvement in function (CMIF): Sustained CMIF goals met  Plan: Pump refill today   Pharmacotherapy Assessment   Analgesic: Morphine (MS Contin) 30 mg PO BID (60 mg/day of morphine) (60 MME/day) +  PF-(intrathecal)-Fentanyl (13.4 mcg/hr)  changed from Oxycodone IR 10 mg 4x/day (40 mg/day of oxycodone) (60 MME/day), due to national shortage MME/day: 60 MME/day (oral) + 32.16 MME/day (intrathecal) = 93.16 MME/day (Aprox. 1.16 MME/day/kg).   Monitoring: [place] PMP: PDMP reviewed  during this encounter.        Pharmacotherapy: No side-effects or adverse reactions reported. Compliance: No problems identified. Effectiveness: Clinically acceptable. Plan: Refer to "POC". UDS:  Summary  Date Value Ref Range Status  02/18/2021 Note  Final    Comment:    ==================================================================== [employer] Select 13 (MW) ==================================================================== Test                             Result       Flag       Units  Drug Present and Declared for Prescription Verification   Morphine                       >4000        EXPECTED   ng/mg creat   Normorphine                    405          EXPECTED   ng/mg creat    Potential sources of large amounts of morphine in the absence of    codeine include administration of morphine or use of heroin.     Normorphine is an expected metabolite of morphine.    Hydromorphone                  52           EXPECTED   ng/mg creat    Hydromorphone may be present as a metabolite of morphine;    concentrations of hydromorphone rarely exceed 5% of the morphine    concentration when this is the source of hydromorphone.    Fentanyl                       27           EXPECTED   ng/mg creat   Norfentanyl                    59           EXPECTED   ng/mg creat    Source of fentanyl is a scheduled prescription medication, including    IV, patch, and transmucosal formulations. Norfentanyl is an expected    metabolite of fentanyl.  ==================================================================== Test                      Result    Flag   Units      Ref Range   Creatinine              250              mg/dL      >=20 ==================================================================== Declared Medications:  The flagging and interpretation on this report are based on the  following declared medications.  Unexpected results may arise from  inaccuracies in the declared medications.   **Note: The testing scope  of this panel includes these medications:   Fentanyl  Morphine (MS Contin)   **Note: The testing scope of this panel does not include the  following reported medications:   Apixaban (Eliquis)  Atorvastatin (Lipitor)  Bupivacaine  Lisinopril (Zestril)  Metoprolol (Toprol)  Ropinirole (Requip)  Tizanidine (Zanaflex) ==================================================================== For clinical consultation, please call [phone]. ====================================================================      Pre-op H&P Assessment:  Ms. Cantrall is a 60 y.o. (year old), female patient, seen today for interventional treatment. 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 Ms. Asman has a current medication list which includes the following prescription(s): AMBULATORY NON FORMULARY MEDICATION, eliquis, atorvastatin, biotin w/ vitamins c & e, calcium citrate-vitamin d, furosemide, lisinopril, metoprolol succinate, naloxone, metoprolol tartrate, [START ON 08/18/2021] morphine, [START ON 09/17/2021] morphine, [START ON 10/17/2021] morphine, and ropinirole. Her primarily concern today is the Back Pain (Lumbar bilateral )  Initial Vital Signs:  Pulse/HCG Rate: 65  Temp: (!) 97 F (36.1 C) Resp: 16 BP: (!) 189/97 SpO2: 99 %  BMI: Estimated body mass index is 31 kg/m as calculated from the following:   Height as of this encounter: _0  (1.6 m).   Weight as of this encounter: 175 lb (79.4 kg).  Risk Assessment: Allergies: Reviewed. She is allergic to benadryl [diphenhydramine hcl], bee venom, and  dexamethasone.  Allergy Precautions: None required Coagulopathies: Reviewed. None identified.  Blood-thinner therapy: None at this time Active Infection(s): Reviewed. None identified. Ms. Zupko is afebrile  Site Confirmation: Ms. Neaves was asked to confirm the procedure and laterality before marking the site Procedure checklist: Completed Consent: Before the procedure and under the influence of no sedative(s), amnesic(s), or anxiolytics, the patient was informed of the treatment options, risks and possible complications. To fulfill our ethical and legal obligations, as recommended by the American Medical Association's Code of Ethics, I have informed the patient of my clinical impression; the nature and purpose of the treatment or procedure; the risks, benefits, and possible complications of the intervention; the alternatives, including doing nothing; the risk(s) and benefit(s) of the alternative treatment(s) or procedure(s); and the risk(s) and benefit(s) of doing nothing.  Ms. Bromell was provided with information about the general risks and possible complications associated with most interventional procedures. These include, but are not limited to: failure to achieve desired goals, infection, bleeding, organ or nerve damage, allergic reactions, paralysis, and/or death.  In addition, she was informed of those risks and possible complications associated to this particular procedure, which include, but are not limited to: damage to the implant; failure to decrease pain; local, systemic, or serious CNS infections, intraspinal abscess with possible cord compression and paralysis, or life-threatening such as meningitis; bleeding; organ damage; nerve injury or damage with subsequent sensory, motor, and/or autonomic system dysfunction, resulting in transient or permanent pain, numbness, and/or weakness of one or several areas of the body; allergic reactions, either minor or major life-threatening,  such as anaphylactic or anaphylactoid reactions.  Furthermore, Ms. Napolitano was informed of those risks and complications associated with the medications. These include, but are not limited to: allergic reactions (i.e.: anaphylactic or anaphylactoid reactions); endorphine suppression; bradycardia and/or hypotension; water retention and/or peripheral vascular relaxation leading to lower extremity edema and possible stasis ulcers; respiratory depression and/or shortness of breath; decreased metabolic rate leading to weight gain; swelling or edema; medication-induced neural toxicity; particulate matter embolism and blood vessel occlusion with resultant organ, and/or nervous system infarction; and/or intrathecal granuloma formation with possible spinal cord compression and permanent paralysis.  Before refilling the pump Ms. Salameh was informed that some of the medications used in the devise may not be FDA approved for such use and therefore it constitutes an off-label use of the medications.  Finally, she was informed that Medicine  is not an Chief Strategy Officer; therefore, there is also the possibility of unforeseen or unpredictable risks and/or possible complications that may result in a catastrophic outcome. The patient indicated having understood very clearly. We have given the patient no guarantees and we have made no promises. Enough time was given to the patient to ask questions, all of which were answered to the patient's satisfaction. Ms. Manwarren has indicated that she wanted to continue with the procedure. Attestation: I, the ordering provider, attest that I have discussed with the patient the benefits, risks, side-effects, alternatives, likelihood of achieving goals, and potential problems during recovery for the procedure that I have provided informed consent. Date  Time: 08/05/2021 12:20 PM  Pre-Procedure Preparation:  Monitoring: As per clinic protocol. Respiration, ETCO2, SpO2, BP, heart rate  and rhythm monitor placed and checked for adequate function Safety Precautions: Patient was assessed for positional comfort and pressure points before starting the procedure. Time-out: I initiated and conducted the "Time-out" before starting the procedure, as per protocol. The patient was asked to participate by confirming the accuracy of the "Time Out" information. Verification of the correct person, site, and procedure were performed and confirmed by me, the nursing staff, and the patient. "Time-out" conducted as per Joint Commission's Universal Protocol (UP.01.01.01). Time: 1221  Description of Procedure:          Position: Supine Target Area: Central-port of intrathecal pump. Approach: Anterior, 90 degree angle approach. Area Prepped: Entire Area around the pump implant. DuraPrep (Iodine Povacrylex [0.7% available iodine] and Isopropyl Alcohol, 74% w/w) Safety Precautions: Aspiration looking for blood return was conducted prior to all injections. At no point did we inject any substances, as a needle was being advanced. No attempts were made at seeking any paresthesias. Safe injection practices and needle disposal techniques used. Medications properly checked for expiration dates. SDV (single dose vial) medications used. Description of the Procedure: Protocol guidelines were followed. Two nurses trained to do implant refills were present during the entire procedure. The refill medication was checked by both healthcare providers as well as the patient. The patient was included in the "Time-out" to verify the medication. The patient was placed in position. The pump was identified. The area was prepped in the usual manner. The sterile template was positioned over the pump, making sure the side-port location matched that of the pump. Both, the pump and the template were held for stability. The needle provided in the Medtronic Kit was then introduced thru the center of the template and into the central  port. The pump content was aspirated and discarded volume documented. The new medication was slowly infused into the pump, thru the filter, making sure to avoid overpressure of the device. The needle was then removed and the area cleansed, making sure to leave some of the prepping solution back to take advantage of its long term bactericidal properties. The pump was interrogated and programmed to reflect the correct medication, volume, and dosage. The program was printed and taken to the physician for approval. Once checked and signed by the physician, a copy was provided to the patient and another scanned into the EMR.  Vitals:   08/05/21 1220  BP: (!) 189/97  Pulse: 65  Resp: 16  Temp: (!) 97 F (36.1 C)  TempSrc: Temporal  SpO2: 99%  Weight: 175 lb (79.4 kg)  Height: _0  (1.6 m)    Start Time: 1221 hrs. End Time: 1233 hrs. Materials & Medications: Medtronic Refill Kit Medication(s): Please see chart orders for  details.  Imaging Guidance:          Type of Imaging Technique: None used Indication(s): N/A Exposure Time: No patient exposure Contrast: None used. Fluoroscopic Guidance: N/A Ultrasound Guidance: N/A Interpretation: N/A  Antibiotic Prophylaxis:   Anti-infectives (From admission, onward)    None      Indication(s): None identified  Post-operative Assessment:  Post-procedure Vital Signs:  Pulse/HCG Rate: 65  Temp: (!) 97 F (36.1 C) Resp: 16 BP: (!) 189/97 SpO2: 99 %  EBL: None  Complications: No immediate post-treatment complications observed by team, or reported by patient.  Note: The patient tolerated the entire procedure well. A repeat set of vitals were taken after the procedure and the patient was kept under observation following institutional policy, for this type of procedure. Post-procedural neurological assessment was performed, showing return to baseline, prior to discharge. The patient was provided with post-procedure discharge instructions,  including a section on how to identify potential problems. Should any problems arise concerning this procedure, the patient was given instructions to immediately contact us, at any time, without hesitation. In any case, we plan to contact the patient by telephone for a follow-up status report regarding this interventional procedure.  Comments:  No additional relevant information.  Plan of Care  Orders:  Orders Placed This Encounter  Procedures   PUMP REFILL    Maintain Protocol by having two(2) healthcare providers during procedure and programming.    Scheduling Instructions:     Please refill intrathecal pump today.    Order Specific Question:   Where will this procedure be performed?    Answer:   [facility]   PUMP REFILL    Whenever possible schedule on a procedure today.    Standing Status:   Future    Standing Expiration Date:   12/04/2021    Scheduling Instructions:     Please schedule intrathecal pump refill based on pump programming. Avoid schedule intervals of more than 120 days (4 months).    Order Specific Question:   Where will this procedure be performed?    Answer:   [facility]   DG Thoracic Spine 4V    Patient presents with axial pain with possible radicular component. Please assist [place] in identifying specific level(s) and laterality of any additional findings such as: 1. Facet (Zygapophyseal) joint DJD (Hypertrophy, space narrowing, subchondral sclerosis, and/or osteophyte formation) 2. DDD and/or IVDD (Loss of disc height, desiccation, gas patterns, osteophytes, endplate sclerosis, or "Black disc disease") 3. Pars defects 4. Spondylolisthesis, spondylosis, and/or spondyloarthropathies (include Degree/Grade of displacement in mm) (stability) 5. Vertebral body Fractures (acute/chronic) (state percentage of collapse) 6. Demineralization (osteopenia/osteoporotic) 7. Bone pathology 8. Foraminal narrowing  9. Surgical changes    Standing Status:   Future     Standing Expiration Date:   11/03/2021    Order Specific Question:   Reason for Exam (SYMPTOM  OR DIAGNOSIS REQUIRED)    Answer:   Upper back pain and/or thoracic spine pain.    Order Specific Question:   Is patient pregnant?    Answer:   No    Order Specific Question:   Preferred imaging location?    Answer:   [facility]    Order Specific Question:   Call Results- Best Contact Number?    Answer:   [phone] ([facility])   DG Lumbar Spine Complete W/Bend    Patient presents with axial pain with possible radicular component. Please assist [place] in identifying specific level(s) and laterality of any  additional findings such as: 1. Facet (Zygapophyseal) joint DJD (Hypertrophy, space narrowing, subchondral sclerosis, and/or osteophyte formation) 2. DDD and/or IVDD (Loss of disc height, desiccation, gas patterns, osteophytes, endplate sclerosis, or "Black disc disease") 3. Pars defects 4. Spondylolisthesis, spondylosis, and/or spondyloarthropathies (include Degree/Grade of displacement in mm) (stability) 5. Vertebral body Fractures (acute/chronic) (state percentage of collapse) 6. Demineralization (osteopenia/osteoporotic) 7. Bone pathology 8. Foraminal narrowing  9. Surgical changes    Standing Status:   Future    Standing Expiration Date:   09/05/2021    Scheduling Instructions:     Imaging must be done as soon as possible. Inform patient that order will expire within 30 days and I will not renew it.    Order Specific Question:   Reason for Exam (SYMPTOM  OR DIAGNOSIS REQUIRED)    Answer:   Low back pain    Order Specific Question:   Is patient pregnant?    Answer:   No    Order Specific Question:   Preferred imaging location?    Answer:   [facility]    Order Specific Question:   Call Results- Best Contact Number?    Answer:   [phone] ([facility])    Order Specific Question:   Radiology Contrast Protocol - do NOT remove file path    Answer:    [email or web address]    Order Specific Question:   Release to patient    Answer:   Immediate   Informed Consent Details: Physician/Practitioner Attestation; Transcribe to consent form and obtain patient signature    Transcribe to consent form and obtain patient signature.    Order Specific Question:   Physician/Practitioner attestation of informed consent for procedure/surgical case    Answer:   I, the physician/practitioner, attest that I have discussed with the patient the benefits, risks, side effects, alternatives, likelihood of achieving goals and potential problems during recovery for the procedure that I have provided informed consent.    Order Specific Question:   Procedure    Answer:   Intrathecal pump refill    Order Specific Question:   Physician/Practitioner performing the procedure    Answer:   Attending Physician: Kathlen Brunswick. Dossie Arbour, MD & designated trained staff    Order Specific Question:   Indication/Reason    Answer:   Chronic Pain Syndrome (G89.4), presence of an intrathecal pump (Z97.8)    Chronic Opioid Analgesic:  Morphine (MS Contin) 30 mg PO BID (60 mg/day of morphine) (60 MME/day) +  PF-(intrathecal)-Fentanyl (13.4 mcg/hr)  changed from Oxycodone IR 10 mg 4x/day (40 mg/day of oxycodone) (60 MME/day), due to national shortage MME/day: 60 MME/day (oral) + 32.16 MME/day (intrathecal) = 93.16 MME/day (Aprox. 1.16 MME/day/kg).   Medications ordered for procedure: Meds ordered this encounter  Medications   morphine (MS CONTIN) 30 MG 12 hr tablet    Sig: Take 1 tablet (30 mg total) by mouth every 12 (twelve) hours. Must last 30 days. Do not break tablet    Dispense:  60 tablet    Refill:  0    DO NOT: delete (not duplicate); no partial-fill (will deny script to complete), no refill request (F/U required). DISPENSE: 1 day early if closed on fill date. WARN: No CNS-depressants within 8 hrs of med.   morphine (MS CONTIN) 30 MG 12 hr  tablet    Sig: Take 1 tablet (30 mg total) by mouth every 12 (twelve) hours. Must last 30 days. Do not break tablet    Dispense:  60 tablet  Refill:  0    DO NOT: delete (not duplicate); no partial-fill (will deny script to complete), no refill request (F/U required). DISPENSE: 1 day early if closed on fill date. WARN: No CNS-depressants within 8 hrs of med.   morphine (MS CONTIN) 30 MG 12 hr tablet    Sig: Take 1 tablet (30 mg total) by mouth every 12 (twelve) hours. Must last 30 days. Do not break tablet    Dispense:  60 tablet    Refill:  0    DO NOT: delete (not duplicate); no partial-fill (will deny script to complete), no refill request (F/U required). DISPENSE: 1 day early if closed on fill date. WARN: No CNS-depressants within 8 hrs of med.    Medications administered: Hassan Rowan A. [place] had no medications administered during this visit.  See the medical record for exact dosing, route, and time of administration.  Follow-up plan:   Return for Pump Refill (Max:41mo.      Interventional Therapies  Risk  Complexity Considerations:   Estimated body mass index is 31 kg/m as calculated from the following:   Height as of this encounter: _0  (1.6 m).   Weight as of this encounter: 175 lb (79.4 kg). ELIQUIS Anticoagulation (Stop: 3 days  Restart: 6 hours) Severe anxiety and agoraphobia  Poor candidate for RFA secondary to Lumbar hardware   Planned  Pending:   Intrathecal pump refill as per pump programming.   Under consideration:   Diagnostic left IA knee joint inj  Diagnostic bilateral L2 TFESI  Diagnostic left genicular NB  Possible left genicular nerve RFA  Diagnostic caudal ESI + epidurogram  Possible Racz procedure    Completed:   Palliative intrathecal pump refills and adjustments  [facility] (05/10/2012)  [facility] (05/09/[facility]  Spinal cord  [facility]ulator [facility] (06/09/2016)    Therapeutic  Palliative (PRN) options:   Therapeutic/palliative intrathecal pump  management (analysis and refill)     Recent Visits Date Type Provider Dept  05/15/21 Procedure visit NMilinda Pointer MD [facility]  Showing recent visits within past 90 days and meeting all other requirements Today's Visits Date Type Provider Dept  08/05/21 Procedure visit NMilinda Pointer MD [facility]  Showing today's visits and meeting all other requirements Future Appointments Date Type Provider Dept  10/30/21 Appointment NMilinda Pointer MD [facility]  Showing future appointments within next 90 days and meeting all other requirements Disposition: Discharge home  Discharge (Date  Time): 08/05/2021; 1243 hrs.   Primary Care Physician: JBerkley Harvey NP [facility] Note by: FGaspar Cola MD Date: 08/05/2021; Time: 12:56 PM  Disclaimer:  Medicine is not an eChief Strategy Officer The only guarantee in medicine is that nothing is guaranteed. It is important to note that the decision to proceed with this intervention was based on the information collected from the patient. The Data and conclusions were drawn from the patient's questionnaire, the interview, and the physical examination. Because the information was provided in large part by the patient, it cannot be guaranteed that it has not been purposely or unconsciously manipulated. Every effort has been made to obtain as much relevant data as possible for this evaluation. It is important to note that the conclusions that lead to this procedure are derived in large part from the available data. Always take into account that the treatment will also  be dependent on availability of resources and existing treatment guidelines, considered by other Pain Management Practitioners as being common knowledge  and practice, at the time of the intervention. For Medico-Legal purposes, it is also important to point out that variation in procedural techniques and pharmacological choices are the acceptable norm. The indications, contraindications, technique, and results of the above procedure should only be interpreted and judged by a Board-Certified Interventional Pain Specialist with extensive familiarity and expertise in the same exact procedure and technique.

## 2021-08-05 NOTE — Telephone Encounter (Signed)
**Note De-Identified Kendra Nelson Obfuscation** Letter received from [facility] [name] fax stating that they have approved the pt for asst with Eliquis until 08/30/2021. BMSPAF Case #: [id number]  The letter states that they have notified the pt of this approval as well.

## 2021-08-06 ENCOUNTER — Telehealth: Payer: Self-pay

## 2021-08-06 NOTE — Telephone Encounter (Signed)
Post procedure phone call.  LM 

## 2021-08-08 MED FILL — Medication: INTRATHECAL | Qty: 1 | Status: AC

## 2021-08-18 ENCOUNTER — Telehealth: Payer: Self-pay | Admitting: Pain Medicine

## 2021-08-18 NOTE — Telephone Encounter (Signed)
Called patient's husband back to let him know that [facility] would not agree to [facility].  Patient verbalizes u/o information.

## 2021-08-18 NOTE — Telephone Encounter (Signed)
Hus[facility] and wont have them until possibly tomorrow. Wants to know if that script can be canceled and [facility] [address] [phone]. This is for her Morphine. Please call patient and advise

## 2021-09-09 ENCOUNTER — Encounter: Payer: Self-pay | Admitting: Pain Medicine

## 2021-09-10 ENCOUNTER — Encounter: Payer: Self-pay | Admitting: Cardiology

## 2021-09-10 ENCOUNTER — Other Ambulatory Visit: Payer: Self-pay

## 2021-09-10 ENCOUNTER — Ambulatory Visit: Payer: Medicare HMO | Admitting: Cardiology

## 2021-09-10 VITALS — BP 150/102 | HR 59 | Ht 63.0 in | Wt 186.8 lb

## 2021-09-10 DIAGNOSIS — I480 Paroxysmal atrial fibrillation: Secondary | ICD-10-CM | POA: Diagnosis not present

## 2021-09-10 DIAGNOSIS — I10 Essential (primary) hypertension: Secondary | ICD-10-CM | POA: Diagnosis not present

## 2021-09-10 MED ORDER — CARVEDILOL 6.25 MG PO TABS: 6.2500 mg | ORAL_TABLET | Freq: Two times a day (BID) | ORAL | 3 refills | Status: DC

## 2021-09-10 NOTE — Patient Instructions (Addendum)
Medication Instructions:  Your physician has recommended you make the following change in your medication:  STOP Eliquis STOP Toprol (metoprolol succinate) START Carvedilol (Coreg) 12.5 mg twice daily  *If you need a refill on your cardiac medications before your next appointment, please call your pharmacy*   Lab Work: None ordered   Testing/Procedures: None ordered   Follow-Up: At [facility], you and your health needs are our priority.  As part of our continuing mission to provide you with exceptional heart care, we have created designated Provider Care Teams.  These Care Teams include your primary Cardiologist (physician) and Advanced Practice Providers (APPs -  Physician Assistants and Nurse Practitioners) who all work together to provide you with the care you need, when you need it.  Your next appointment:   3 month(s)  The format for your next appointment:   In Person  Provider:   Loman Brooklyn, MD    Thank you for choosing [facility]!!   Dory Horn, RN [phone]   Other Instructions   Carvedilol Tablets What is this medication? CARVEDILOL (KAR ve dil ol) treats high blood pressure and heart failure. It may also be used to prevent further damage after a heart attack. It works by lowering your blood pressure and heart rate, making it easier for your heart to pump blood to the rest of your body. It belongs to a group of medications called beta blockers. This medicine may be used for other purposes; ask your health care provider or pharmacist if you have questions. COMMON BRAND NAME(S): Coreg What should I tell my care team before I take this medication? They need to know if you have any of these conditions: Circulation problems Diabetes History of heart attack or heart disease Liver disease Lung or breathing disease, like asthma Pheochromocytoma Slow or irregular heartbeat Thyroid disease An unusual or allergic reaction to carvedilol, other beta  blockers, medications, foods, dyes, or preservatives Pregnant or trying to get pregnant Breast-feeding How should I use this medication? Take this medication by mouth. Take it as directed on the prescription label at the same time every day. Take it with food. Keep taking it unless your care team tells you to stop. Talk to your care team about the use of this medication in children. Special care may be needed. Overdosage: If you think you have taken too much of this medicine contact a poison control center or emergency room at once. NOTE: This medicine is only for you. Do not share this medicine with others. What if I miss a dose? If you miss a dose, take it as soon as you can. If it is almost time for your next dose, take only that dose. Do not take double or extra doses. What may interact with this medication? This medication may interact with the following: Certain medications for blood pressure, heart disease, irregular heartbeat Certain medications for depression, like fluoxetine or paroxetine Certain medications for diabetes, like glipizide or glyburide Cimetidine Clonidine Cyclosporine Digoxin MAOIs like Carbex, Eldepryl, Marplan, Nardil, and Parnate Reserpine Rifampin This list may not describe all possible interactions. Give your health care provider a list of all the medicines, herbs, non-prescription drugs, or dietary supplements you use. Also tell them if you smoke, drink alcohol, or use illegal drugs. Some items may interact with your medicine. What should I watch for while using this medication? Visit your care team for regular checks on your progress. Check your blood pressure as directed. Ask your care team what your blood  pressure should be. Also, find out when you should contact them. Do not treat yourself for coughs, colds, or pain while you are using this medication without asking your care team for advice. Some medications may increase your blood pressure. You may get  drowsy or dizzy. Do not drive, use machinery, or do anything that needs mental alertness until you know how this medication affects you. Do not stand up or sit up quickly, especially if you are an older patient. This reduces the risk of dizzy or fainting spells. Alcohol may interfere with the effect of this medication. Avoid alcoholic drinks. This medication may increase blood sugar. Ask your care team if changes in diet or medications are needed if you have diabetes. If you are going to need surgery or another procedure, tell your care team that you are using this medication. What side effects may I notice from receiving this medication? Side effects that you should report to your care team as soon as possible: Allergic reactions--skin rash, itching, hives, swelling of the face, lips, tongue, or throat Heart failure--shortness of breath, swelling of the ankles, feet, or hands, sudden weight gain, unusual weakness or fatigue Low blood pressure--dizziness, feeling faint or lightheaded, blurry vision Raynaud's--cool, numb, or painful fingers or toes that may change color from pale, to blue, to red Slow heartbeat--dizziness, feeling faint or lightheaded, confusion, trouble breathing, unusual weakness or fatigue Worsening mood, feelings of depression Side effects that usually do not require medical attention (report to your care team if they continue or are bothersome): Change in sex drive or performance Diarrhea Dizziness Fatigue Headache This list may not describe all possible side effects. Call your doctor for medical advice about side effects. You may report side effects to FDA at 1-800-FDA-1088. Where should I keep my medication? Keep out of the reach of children and pets. Store at room temperature between 20 and 25 degrees C (68 and 77 degrees F). Protect from moisture. Keep the container tightly closed. Throw away any unused medication after the expiration date. NOTE: This sheet is a summary.  It may not cover all possible information. If you have questions about this medicine, talk to your doctor, pharmacist, or health care provider.  2022 Elsevier/Gold Standard (2020-10-11 00:00:00)

## 2021-09-10 NOTE — Progress Notes (Signed)
Electrophysiology Office Note   Date:  09/10/2021   ID:  Kendra Nelson, DOB 1960/09/14, MRN [id number]  PCP:  Berkley Harvey, NP  Cardiologist:  Margaretann Loveless Primary Electrophysiologist:  Cailah Reach Meredith Leeds, MD   Chief Complaint: AF   History of Present Illness: Kendra Nelson is a 61 y.o. female who is being seen today for the evaluation of AF at the request of Berkley Harvey, NP. Presenting today for electrophysiology evaluation.  She has a history sending for hypertension, hyperlipidemia, chronic back pain status post spinal cord stimulator, atrial fibrillation.  She had an ECG after surgery that showed atrial fibrillation was started on amiodarone.  She is now status post ablation 06/13/2021.  Today, denies symptoms of palpitations, chest pain, shortness of breath, orthopnea, PND, lower extremity edema, claudication, dizziness, presyncope, syncope, bleeding, or neurologic sequela. The patient is tolerating medications without difficulties.  Since being seen she has done well.  She has noted no further episodes of atrial fibrillation.  She is overall happy with her control.  She does state that her blood pressures have been elevated.  Past Medical History:  Diagnosis Date   Anxiety    off of valium- stress   Arthritis    left knee- rhumatoid- hasnt seen specialist    Chronic lower back pain    Complication of anesthesia    WOKE DURING BACK SURGERY ; urinary retention 12/20/2017   Hyperlipidemia    Hypertension    Migraine    "when seasons change" (12/20/2017)   Neuromuscular disorder (HCC)    Restless leg syndrome    Seasonal allergies    Skin abnormalities    Wears glasses    Past Surgical History:  Procedure Laterality Date   ATRIAL FIBRILLATION ABLATION N/A 06/13/2021   Procedure: ATRIAL FIBRILLATION ABLATION;  Surgeon: Constance Haw, MD;  Location: [facility] [facility];  Service: Cardiovascular;  Laterality: N/A;   BACK SURGERY     8-9 SURGERIES; lower back    COLONOSCOPY     INTRATHECAL PUMP REVISION N/A 05/09/2019   Procedure: Intrathecal pump change;  Surgeon: Clydell Hakim, MD;  Location: [place];  Service: Neurosurgery;  Laterality: N/A;  Intrathecal pump change   JOINT REPLACEMENT     PAIN PUMP IMPLANTATION  05/10/2012   Procedure: PAIN PUMP INSERTION;  Surgeon: Erline Levine, MD;  Location: [place] [facility];  Service: Neurosurgery;  Laterality: N/A;  Pump replacement   SPINAL CORD STIMULATOR BATTERY EXCHANGE N/A 06/09/2016   Procedure: IMPLANTABLE PULSE GENERATOR REPLACEMENT WITH RECHARGEABLE BATTERY;  Surgeon: Erline Levine, MD;  Location: [place];  Service: Neurosurgery;  Laterality: N/A;   TOTAL KNEE ARTHROPLASTY Left 12/20/2017   TOTAL KNEE ARTHROPLASTY Left 12/20/2017   Procedure: TOTAL KNEE ARTHROPLASTY;  Surgeon: Frederik Pear, MD;  Location: [place];  Service: Orthopedics;  Laterality: Left;   TOTAL KNEE REVISION Left 12/19/2020   Procedure: TOTAL KNEE REVISION/ Total Knee Polynethylene Barring, Attune Total Knee;  Surgeon: Frederik Pear, MD;  Location: [facility];  Service: Orthopedics;  Laterality: Left;   VAGINAL HYSTERECTOMY       Current Outpatient Medications  Medication Sig Dispense Refill   AMBULATORY NON FORMULARY MEDICATION Medication Name:  Intrathecal pump Fentanyl 1,000.0 mcg/ml Bupivacaine 20.0 mg/ml 40 ml pump Rate 357.2 mcg/day     atorvastatin (LIPITOR) 20 MG tablet Take 20 mg by mouth every morning.      Biotin w/ Vitamins C & E (HAIR/SKIN/NAILS PO) Take 3 tablets by mouth daily.  Calcium Citrate-Vitamin D (CALCIUM + D PO) Take 1 tablet by mouth daily.     carvedilol (COREG) 6.25 MG tablet Take 1 tablet (6.25 mg total) by mouth 2 (two) times daily. 60 tablet 3   lisinopril (ZESTRIL) 20 MG tablet Take 2 tablets (40 mg total) by mouth every morning. 30 tablet 0   metoprolol tartrate (LOPRESSOR) 25 MG tablet Take 25 mg by mouth 2 (two) times daily as needed (palpitations).     morphine (MS CONTIN) 30 MG 12 hr tablet Take 1  tablet (30 mg total) by mouth every 12 (twelve) hours. Must last 30 days. Do not break tablet 60 tablet 0   naloxone (NARCAN) nasal spray 4 mg/0.1 mL Place 1 spray into the nose as needed for up to 365 doses (for opioid-induced respiratory depresssion). In case of emergency (overdose), spray once into each nostril. If no response within 3 minutes, repeat application and call [phone]. 1 each 0   rOPINIRole (REQUIP) 0.5 MG tablet Take 1 mg by mouth at bedtime as needed (restless legs).     furosemide (LASIX) 20 MG tablet Take 1 tablet by  mouth daily for the next 3 days then only as needed for weight gain/swelling (Patient not taking: Reported on 09/10/2021) 10 tablet 0   [START ON 09/17/2021] morphine (MS CONTIN) 30 MG 12 hr tablet Take 1 tablet (30 mg total) by mouth every 12 (twelve) hours. Must last 30 days. Do not break tablet (Patient not taking: Reported on 09/10/2021) 60 tablet 0   [START ON 10/17/2021] morphine (MS CONTIN) 30 MG 12 hr tablet Take 1 tablet (30 mg total) by mouth every 12 (twelve) hours. Must last 30 days. Do not break tablet (Patient not taking: Reported on 09/10/2021) 60 tablet 0   No current facility-administered medications for this visit.    Allergies:   Benadryl [diphenhydramine hcl], Bee venom, and Dexamethasone   Social History:  The patient  reports that she quit smoking about 2 years ago. Her smoking use included cigarettes. She has a 30.00 pack-year smoking history. She has never used smokeless tobacco. She reports current drug use. Drug: Oxycodone. She reports that she does not drink alcohol.   Family History:  The patient's family history includes Aortic aneurysm in her mother; Diabetes in her maternal grandmother.   ROS:  Please see the history of present illness.   Otherwise, review of systems is positive for none.   All other systems are reviewed and negative.   PHYSICAL EXAM: VS:  BP (!) 150/102    Pulse (!) 59    Ht 5\' 3"  (1.6 m)    Wt 186 lb 12.8 oz (84.7 kg)     SpO2 97%    BMI 33.09 kg/m  , BMI Body mass index is 33.09 kg/m. GEN: Well nourished, well developed, in no acute distress  HEENT: normal  Neck: no JVD, carotid bruits, or masses Cardiac: RRR; no murmurs, rubs, or gallops,no edema  Respiratory:  clear to auscultation bilaterally, normal work of breathing GI: soft, nontender, nondistended, + BS MS: no deformity or atrophy  Skin: warm and dry Neuro:  Strength and sensation are intact Psych: euthymic mood, full affect  EKG:  EKG is ordered today. Personal review of the ekg ordered shows sinus rhythm, rate 59  Recent Labs: 12/19/2020: TSH 0.705 12/20/2020: Magnesium 2.1 06/06/2021: ALT 29; BUN 12; Creatinine, Ser 0.80; Hemoglobin 15.9; Platelets 209; Potassium 4.1; Sodium 140    Lipid Panel  No results found for: CHOL, TRIG,  HDL, CHOLHDL, VLDL, LDLCALC, LDLDIRECT   Wt Readings from Last 3 Encounters:  09/10/21 186 lb 12.8 oz (84.7 kg)  08/05/21 175 lb (79.4 kg)  07/11/21 183 lb 9.6 oz (83.3 kg)      Other studies Reviewed: Additional studies/ records that were reviewed today include: TTE 12/20/20  Review of the above records today demonstrates:   1. Left ventricular ejection fraction, by estimation, is 60 to 65%. The  left ventricle has normal function. The left ventricle has no regional  wall motion abnormalities. Left ventricular diastolic parameters are  consistent with Grade II diastolic  dysfunction (pseudonormalization).   2. Right ventricular systolic function is normal. The right ventricular  size is normal.   3. Left atrial size was moderately dilated.   4. The mitral valve is normal in structure. Trivial mitral valve  regurgitation. No evidence of mitral stenosis.   5. The aortic valve is normal in structure. Aortic valve regurgitation is  not visualized. No aortic stenosis is present.   6. The inferior vena cava is dilated in size with >50% respiratory  variability, suggesting right atrial pressure of 8 mmHg.    Myoview 01/17/21 The left ventricular ejection fraction is normal (55-65%). Nuclear stress EF: 55%. No T wave inversion was noted during stress. There was no ST segment deviation noted during stress. This is a low risk study.  ASSESSMENT AND PLAN:  1.  Paroxysmal atrial fibrillation/flutter: Noted on cardiac monitors.  Currently on Toprol-XL and Eliquis 5 mg twice daily.  CHA2DS2-VASc of 2.  Status post ablation 06/13/2021.  She has been feeling well without further episodes of atrial fibrillation.  Due to her low stroke risk, Charle Mclaurin stop her Eliquis.  She is overall happy with her control.  2.  Hypertension: Elevated today.  Has been elevated for the last few months.  We Tristan Bramble stop her metoprolol and start carvedilol 12.5 mg twice daily.  3.  Hyperlipidemia: Continue atorvastatin per primary cardiology  4.  PVCs: 18% burden on cardiac monitor.  Ejection fraction has remained normal and she is asymptomatic.  No changes.  Current medicines are reviewed at length with the patient today.   The patient does not have concerns regarding her medicines.  The following changes were made today: Stop metoprolol, Eliquis, start carvedilol  Labs/ tests ordered today include:  Orders Placed This Encounter  Procedures   EKG 12-Lead     Disposition:   FU with Kaisey Huseby 3 months  Signed, Shailah Gibbins Meredith Leeds, MD  09/10/2021 10:55 AM     [facility] [address] [place] [address] [phone] (office) [phone] (fax)

## 2021-09-11 ENCOUNTER — Ambulatory Visit: Payer: Medicare HMO | Admitting: Cardiology

## 2021-09-18 ENCOUNTER — Ambulatory Visit: Payer: Medicare HMO | Admitting: Podiatry

## 2021-09-23 DIAGNOSIS — H5202 Hypermetropia, left eye: Secondary | ICD-10-CM | POA: Diagnosis not present

## 2021-09-23 DIAGNOSIS — H52209 Unspecified astigmatism, unspecified eye: Secondary | ICD-10-CM | POA: Diagnosis not present

## 2021-09-23 DIAGNOSIS — H524 Presbyopia: Secondary | ICD-10-CM | POA: Diagnosis not present

## 2021-09-23 DIAGNOSIS — H5203 Hypermetropia, bilateral: Secondary | ICD-10-CM | POA: Diagnosis not present

## 2021-09-24 ENCOUNTER — Encounter: Payer: Self-pay | Admitting: Podiatry

## 2021-09-24 ENCOUNTER — Ambulatory Visit (INDEPENDENT_AMBULATORY_CARE_PROVIDER_SITE_OTHER): Payer: Medicare HMO | Admitting: Podiatry

## 2021-09-24 ENCOUNTER — Ambulatory Visit: Payer: Medicare Other

## 2021-09-24 ENCOUNTER — Other Ambulatory Visit: Payer: Self-pay

## 2021-09-24 DIAGNOSIS — M2142 Flat foot [pes planus] (acquired), left foot: Secondary | ICD-10-CM

## 2021-09-24 DIAGNOSIS — M2141 Flat foot [pes planus] (acquired), right foot: Secondary | ICD-10-CM | POA: Diagnosis not present

## 2021-09-24 DIAGNOSIS — M19079 Primary osteoarthritis, unspecified ankle and foot: Secondary | ICD-10-CM | POA: Diagnosis not present

## 2021-09-24 NOTE — Progress Notes (Signed)
°  Subjective:  Patient ID: Kendra Nelson, female    DOB: [birth year],   MRN: [id number]  Chief Complaint  Patient presents with   Flat Foot    Patient reports pain in the arch of the foot and midfoot area. Patient reports ongoing for about 5 years but has gotten worse the past 2 years. Patient states it is difficult for her to go on walks for exercise now. Has tried different shoes that hasn't helped much.     61 y.o. female presents for concern of falling arches and pain in her feet when walking. Relates she will get pain through the bottom of her foot after walks and will also get back pain as well. States it started about 5 years ago and worse in the last 2 years. Relates numbness as well.  . Denies any other pedal complaints. Denies n/v/f/c.   Past Medical History:  Diagnosis Date   Anxiety    off of valium- stress   Arthritis    left knee- rhumatoid- hasnt seen specialist    Chronic lower back pain    Complication of anesthesia    WOKE DURING BACK SURGERY ; urinary retention 12/20/2017   Hyperlipidemia    Hypertension    Migraine    "when seasons change" (12/20/2017)   Neuromuscular disorder (HCC)    Restless leg syndrome    Seasonal allergies    Skin abnormalities    Wears glasses     Objective:  Physical Exam: Vascular: DP/PT pulses 2/4 bilateral. CFT <3 seconds. Normal hair growth on digits. No edema.  Skin. No lacerations or abrasions bilateral feet.  Musculoskeletal: MMT 5/5 bilateral lower extremities in DF, PF, Inversion and Eversion. Deceased ROM in DF of ankle joint. No pain to palpation of the foot. Pes planus noted bilateral mild.  Neurological: Sensation intact to light touch.   Assessment:   1. Bilateral pes planus   2. Arthritis, midfoot      Plan:  Patient was evaluated and treated and all questions answered. -Xrays reviewed -Discussed treatement options; discussed pes planus deformity and midfoot arthritis ;conservative and  surgical  -Will get  scheduled for orthotic fitting.  -Recommend good supportive shoes -Recommend daily stretching and icing -Did discuss some of these problems are likely coming from back issues and should follow-up with PCP.  -Patient to return to office as needed or sooner if condition worsens.   Lorenda Peck, DPM

## 2021-09-30 ENCOUNTER — Ambulatory Visit (INDEPENDENT_AMBULATORY_CARE_PROVIDER_SITE_OTHER): Payer: Medicare HMO

## 2021-09-30 ENCOUNTER — Other Ambulatory Visit: Payer: Self-pay

## 2021-09-30 DIAGNOSIS — M2141 Flat foot [pes planus] (acquired), right foot: Secondary | ICD-10-CM | POA: Diagnosis not present

## 2021-09-30 DIAGNOSIS — M2142 Flat foot [pes planus] (acquired), left foot: Secondary | ICD-10-CM | POA: Diagnosis not present

## 2021-09-30 NOTE — Progress Notes (Signed)
SITUATION Reason for Consult: Evaluation for Bilateral Custom Foot Orthoses Patient / Caregiver Report: Patient is ready for foot orthotics  OBJECTIVE DATA: Patient History / Diagnosis:    ICD-10-CM   1. Bilateral pes planus  M21.41    M21.42       Current or Previous Devices: None and no history  Foot Examination: Skin presentation:   Intact Ulcers & Callousing:   None and no history Toe / Foot Deformities:  Planus Weight Bearing Presentation:  Planus Sensation:    Intact  ORTHOTIC RECOMMENDATION Recommended Device: 1x pair of custom functional foot orthotics Provided Device:  1x pair Powerstep Women's 9 Functional Foot Orthotics  GOALS OF ORTHOSES - Reduce Pain - Prevent Foot Deformity - Prevent Progression of Further Foot Deformity - Relieve Pressure - Improve the Overall Biomechanical Function of the Foot and Lower Extremity.  ACTIONS PERFORMED Financials were discussed and patient wished to proceed with prefabricated PowerStep FOs. All questions were answered and concerns addressed.  Patient was fit with foot orthotics trimmed to shoe last. Patient tolerated fittign procedure.   Patient was provided with verbal and written instruction and demonstration regarding donning, doffing, wear, care, proper fit, function, purpose, cleaning, and use of the orthosis and in all related precautions and risks and benefits regarding the orthosis.  Patient was also provided with verbal instruction regarding how to report any failures or malfunctions of the orthosis and necessary follow up care. Patient was also instructed to contact our office regarding any change in status that may affect the function of the orthosis.  Patient demonstrated independence with proper donning, doffing, and fit and verbalized understanding of all instructions.  PLAN: Patient is to follow up in one week or as necessary (PRN). All questions were answered and concerns addressed. Plan of care was discussed  with and agreed upon by the patient.

## 2021-10-06 ENCOUNTER — Other Ambulatory Visit: Payer: Self-pay

## 2021-10-06 MED ORDER — PAIN MANAGEMENT IT PUMP REFILL: 1.0000 | Freq: Once | INTRATHECAL | 0 refills | Status: AC

## 2021-10-06 NOTE — Progress Notes (Signed)
It pump  

## 2021-10-29 NOTE — Progress Notes (Signed)
PROVIDER NOTE: Interpretation of information contained herein should be left to medically-trained personnel. Specific patient instructions are provided elsewhere under "Patient Instructions" section of medical record. This document was created in part using STT-dictation technology, any transcriptional errors that may result from this process are unintentional.  Patient: Kendra Nelson Type: Established DOB: 01-Jun-1961 MRN: [id number] PCP: Berkley Harvey, NP  Service: Procedure DOS: 10/30/2021 Setting: Ambulatory Location: Ambulatory outpatient facility Delivery: Face-to-face Provider: Gaspar Cola, MD Specialty: Interventional Pain Management Specialty designation: 09 Location: Outpatient facility Ref. Prov.: Berkley Harvey, NP    Primary Reason for Visit: Interventional Pain Management Treatment. CC: Back Pain (lower)  Procedure:          Type: Management of Intrathecal Drug Delivery System (IDDS) - Reservoir Refill [phone]). No rate change.  Indications: 1. Chronic pain syndrome   2. Chronic low back pain (1ry area of Pain) (Bilateral) (R>L) w/ sciatica (Bilateral)   3. Chronic lower extremity pain (2ry area of Pain) (Bilateral) (R>L)   4. 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 Lumbar facet syndrome (Bilateral) (R>L)   6. DDD (degenerative disc disease), lumbar   7. Pharmacologic therapy   8. Long term prescription opiate use   9. Chronic use of opiate for therapeutic purpose   10. Encounter for chronic pain management   11. Encounter for medication management   12. Uncontrolled hypertension    Pain Assessment: Self-Reported Pain Score: 5 /10             Reported level is compatible with observation.        RTCB: 02/14/2022  Patient comes in today with uncontrolled hypertension.  She refers that yesterday she went to the dentist and they canceled the procedure because of her high blood pressure.  She has been going to her PCP to get this  under control and they have tried some medications, but they have been unable to get her blood pressure down.  Today her blood pressure is 219/112.  This has been checked several times with similar results.  She denies any headaches or blurred vision.  She is currently in no acute distress.  She again was instructed to contact her primary care physician for further management.  According to the medical record she is on lisinopril, metoprolol, furosemide, and Coreg.  In view of the inability to get this under control, today I will be ordering blood work to check on her catecholamine levels and VMA to explore the possibility of a pheochromocytoma.  The patient was given instructions to follow-up with her primary care physician for these labs.   Intrathecal Drug Delivery System (IDDS)  Pump Device:  Manufacturer: Medtronic Model: Synchromed II Model No.: [id number] Serial No.: [id number] Delivery Route: Intrathecal Type: Programmable  Volume (mL): 40 mL reservoir Priming Volume: n/a  Calibration Constant: 120.0  MRI compatibility: Conditional   Implant Details:  Date: 05/09/2019 Implanter: Clydell Hakim, MD  Contact Information: [facility] Last Revision/Replacement: 05/09/2019 Estimated Replacement Date: May/2027  Implant Site: Abdominal Laterality: Left  Catheter: Manufacturer: Medtronic Model:  (two piece) Model No.: [id number]  Serial No.: n/a  Implanted Length (cm): 38.1  Catheter Volume (mL): 0.214  Tip Location (Level): T6-7 Canal Access Site: n/a  Drug content:  Primary Medication Class: Opioid  Medication: PF-Fentanyl  Concentration: 1,000 mcg/mL   Secondary Medication Class: Local Anesthetic  Medication: PF-Bupivacaine  Concentration: 20.0 mg/mL   Tertiary Medication Class: none   PA parameters (PCA-mode):  Mode: Off (Inactive)  Programming:  Type: Simple continuous.  Medication, Concentration, Infusion Program, & Delivery Rate: For up-to-date  details please see most recent scanned programming printout.    Changes:  Medication Change: None at this point Rate Change: No change in rate  Reported side-effects or adverse reactions: None reported  Effectiveness: Described as relatively effective, allowing for increase in activities of daily living (ADL) Clinically meaningful improvement in function (CMIF): Sustained CMIF goals met  Plan: Pump refill today   Pharmacotherapy Assessment   Opioid Analgesic: Morphine (MS Contin) 30 mg PO BID (60 mg/day of morphine) (60 MME/day) +  PF-(intrathecal)-Fentanyl (13.4 mcg/hr)   changed from Oxycodone IR 10 mg 4x/day (40 mg/day of oxycodone) (60 MME/day), due to national shortage MME/day: 60 MME/day (oral) + 32.16 MME/day (intrathecal) = 93.16 MME/day (Aprox. 1.16 MME/day/kg).   Monitoring: [place] PMP: PDMP not reviewed this encounter.       Pharmacotherapy: No side-effects or adverse reactions reported. Compliance: No problems identified. Effectiveness: Clinically acceptable. Plan: Refer to "POC". UDS:  Summary  Date Value Ref Range Status  02/18/2021 Note  Final    Comment:    ==================================================================== [employer]ox[employer] Select 13 (MW) ==================================================================== Test                             Result       Flag       Units  Drug Present and Declared for Prescription Verification   Morphine                       >4000        EXPECTED   ng/mg creat   Normorphine                    405          EXPECTED   ng/mg creat    Potential sources of large amounts of morphine in the absence of    codeine include administration of morphine or use of heroin.     Normorphine is an expected metabolite of morphine.    Hydromorphone                  52           EXPECTED   ng/mg creat    Hydromorphone may be present as a metabolite of morphine;    concentrations of hydromorphone rarely exceed 5% of the morphine     concentration when this is the source of hydromorphone.    Fentanyl                       27           EXPECTED   ng/mg creat   Norfentanyl                    59           EXPECTED   ng/mg creat    Source of fentanyl is a scheduled prescription medication, including    IV, patch, and transmucosal formulations. Norfentanyl is an expected    metabolite of fentanyl.  ==================================================================== Test                      Result    Flag   Units      Ref Range   Creatinine  250              mg/dL      >=20 ==================================================================== Declared Medications:  The flagging and interpretation on this report are based on the  following declared medications.  Unexpected results may arise from  inaccuracies in the declared medications.   **Note: The testing scope of this panel includes these medications:   Fentanyl  Morphine (MS Contin)   **Note: The testing scope of this panel does not include the  following reported medications:   Apixaban (Eliquis)  Atorvastatin (Lipitor)  Bupivacaine  Lisinopril (Zestril)  Metoprolol (Toprol)  Ropinirole (Requip)  Tizanidine (Zanaflex) ==================================================================== For clinical consultation, please call [phone]. ====================================================================      Pre-op H&P Assessment:  Ms. Mcgurn is a 61 y.o. (year old), female patient, seen today for interventional treatment. 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 Ms. Lourenco has a current medication list which includes the following prescription(s): AMBULATORY NON FORMULARY MEDICATION, atorvastatin, biotin w/ vitamins c & e, calcium citrate-vitamin d, carvedilol, furosemide, lisinopril, metoprolol tartrate, [START ON 11/16/2021] morphine, [START ON 12/16/2021] morphine, [START ON 01/15/2022] morphine, and naloxone. Her primarily concern today is the Back Pain (lower)  Initial Vital Signs:  Pulse/HCG Rate: 72  Temp: (!) 97.2 F (36.2 C) Resp: 18 BP: (!) 219/112 (talked with pt about need to have BP check and to call MD, instructed to sign of strok. patient will seek help) SpO2: 99 %  BMI: Estimated body mass index is 31 kg/m as calculated from the following:   Height as of this encounter: '5\' 3"'  (1.6 m).   Weight as of this encounter: 175 lb (79.4 kg).  Risk Assessment: Allergies: Reviewed. She is allergic to benadryl [diphenhydramine hcl], bee venom, and dexamethasone.  Allergy Precautions: None required Coagulopathies: Reviewed. None identified.  Blood-thinner therapy: None at this time Active Infection(s): Reviewed. None identified. Ms. Virgen is afebrile  Site Confirmation: Ms. Apel was asked to confirm the procedure and laterality before marking the site Procedure checklist: Completed Consent: Before the procedure and under the influence of no sedative(s), amnesic(s), or anxiolytics, the patient was informed of the treatment options, risks and possible complications. To fulfill our ethical and legal obligations, as recommended by the American Medical Association's Code of Ethics, I have informed the patient of my clinical impression; the nature and purpose of the treatment or procedure; the risks, benefits, and possible complications of the intervention; the alternatives, including doing nothing; the risk(s) and benefit(s) of the alternative treatment(s) or procedure(s); and the risk(s) and benefit(s) of doing nothing.  Ms.  Gosser was provided with information about the general risks and possible complications associated with most interventional procedures. These include, but are not limited to: failure to achieve desired goals, infection, bleeding, organ or nerve damage, allergic reactions, paralysis, and/or death.  In addition, she was informed of those risks and possible complications associated to this particular procedure, which include, but are not limited to: damage to the implant; failure to decrease pain; local, systemic, or serious CNS infections, intraspinal abscess with possible cord compression and paralysis, or life-threatening such as meningitis; bleeding; organ damage; nerve injury or damage with subsequent sensory, motor, and/or autonomic system dysfunction, resulting in transient or permanent pain,  numbness, and/or weakness of one or several areas of the body; allergic reactions, either minor or major life-threatening, such as anaphylactic or anaphylactoid reactions.  Furthermore, Ms. Hesler was informed of those risks and complications associated with the medications. These include, but are not limited to: allergic reactions (i.e.: anaphylactic or anaphylactoid reactions); endorphine suppression; bradycardia and/or hypotension; water retention and/or peripheral vascular relaxation leading to lower extremity edema and possible stasis ulcers; respiratory depression and/or shortness of breath; decreased metabolic rate leading to weight gain; swelling or edema; medication-induced neural toxicity; particulate matter embolism and blood vessel occlusion with resultant organ, and/or nervous system infarction; and/or intrathecal granuloma formation with possible spinal cord compression and permanent paralysis.  Before refilling the pump Ms. Peer was informed that some of the medications used in the devise may not be FDA approved for such use and therefore it constitutes an off-label use of the  medications.  Finally, she was informed that Medicine is not an exact science; therefore, there is also the possibility of unforeseen or unpredictable risks and/or possible complications that may result in a catastrophic outcome. The patient indicated having understood very clearly. We have given the patient no guarantees and we have made no promises. Enough time was given to the patient to ask questions, all of which were answered to the patient's satisfaction. Ms. Mcconnell has indicated that she wanted to continue with the procedure. Attestation: I, the ordering provider, attest that I have discussed with the patient the benefits, risks, side-effects, alternatives, likelihood of achieving goals, and potential problems during recovery for the procedure that I have provided informed consent. Date   Time: 10/30/2021 12:55 PM  Pre-Procedure Preparation:  Monitoring: As per clinic protocol. Respiration, ETCO2, SpO2, BP, heart rate and rhythm monitor placed and checked for adequate function Safety Precautions: Patient was assessed for positional comfort and pressure points before starting the procedure. Time-out: I initiated and conducted the "Time-out" before starting the procedure, as per protocol. The patient was asked to participate by confirming the accuracy of the "Time Out" information. Verification of the correct person, site, and procedure were performed and confirmed by me, the nursing staff, and the patient. "Time-out" conducted as per Joint Commission's Universal Protocol (UP.01.01.01). Time: 1313  Description of Procedure:          Position: Supine Target Area: Central-port of intrathecal pump. Approach: Anterior, 90 degree angle approach. Area Prepped: Entire Area around the pump implant. DuraPrep (Iodine Povacrylex [0.7% available iodine] and Isopropyl Alcohol, 74% w/w) Safety Precautions: Aspiration looking for blood return was conducted prior to all injections. At no point did we inject  any substances, as a needle was being advanced. No attempts were made at seeking any paresthesias. Safe injection practices and needle disposal techniques used. Medications properly checked for expiration dates. SDV (single dose vial) medications used. Description of the Procedure: Protocol guidelines were followed. Two nurses trained to do implant refills were present during the entire procedure. The refill medication was checked by both healthcare providers as well as the patient. The patient was included in the "Time-out" to verify the medication. The patient was placed in position. The pump was identified. The area was prepped in the usual manner. The sterile template was positioned over the pump, making sure the side-port location matched that of the pump. Both, the pump and the template were held for stability. The needle provided in the Medtronic Kit was then introduced thru the center of the template and into the central port. The pump content was  aspirated and discarded volume documented. The new medication was slowly infused into the pump, thru the filter, making sure to avoid overpressure of the device. The needle was then removed and the area cleansed, making sure to leave some of the prepping solution back to take advantage of its long term bactericidal properties. The pump was interrogated and programmed to reflect the correct medication, volume, and dosage. The program was printed and taken to the physician for approval. Once checked and signed by the physician, a copy was provided to the patient and another scanned into the EMR.  Vitals:   10/30/21 1255  BP: (!) 219/112  Pulse: 72  Resp: 18  Temp: (!) 97.2 F (36.2 C)  TempSrc: Temporal  SpO2: 99%  Weight: 175 lb (79.4 kg)  Height: '5\' 3"'  (1.6 m)    Start Time: 1313 hrs. End Time: 1320 hrs. Materials & Medications: Medtronic Refill Kit Medication(s): Please see chart orders for details.  Imaging Guidance:          Type of Imaging  Technique: None used Indication(s): N/A Exposure Time: No patient exposure Contrast: None used. Fluoroscopic Guidance: N/A Ultrasound Guidance: N/A Interpretation: N/A  Antibiotic Prophylaxis:   Anti-infectives (From admission, onward)    None      Indication(s): None identified  Post-operative Assessment:  Post-procedure Vital Signs:  Pulse/HCG Rate: 72  Temp: (!) 97.2 F (36.2 C) Resp: 18 BP: (!) 219/112 (talked with pt about need to have BP check and to call MD, instructed to sign of strok. patient will seek help) SpO2: 99 %  EBL: None  Complications: No immediate post-treatment complications observed by team, or reported by patient.  Note: The patient tolerated the entire procedure well. A repeat set of vitals were taken after the procedure and the patient was kept under observation following institutional policy, for this type of procedure. Post-procedural neurological assessment was performed, showing return to baseline, prior to discharge. The patient was provided with post-procedure discharge instructions, including a section on how to identify potential problems. Should any problems arise concerning this procedure, the patient was given instructions to immediately contact us, at any time, without hesitation. In any case, we plan to contact the patient by telephone for a follow-up status report regarding this interventional procedure.  Comments:  No additional relevant information.  Plan of Care  Orders:  Orders Placed This Encounter  Procedures   PUMP REFILL    Maintain Protocol by having two(2) healthcare providers during procedure and programming.    Scheduling Instructions:     Please refill intrathecal pump today.    Order Specific Question:   Where will this procedure be performed?    Answer:   [facility]   PUMP REFILL    Whenever possible schedule on a procedure today.    Standing Status:   Future    Standing Expiration Date:   03/01/2022     Scheduling Instructions:     Please schedule intrathecal pump refill based on pump programming. Avoid schedule intervals of more than 120 days (4 months).    Order Specific Question:   Where will this procedure be performed?    Answer:   [facility]   VMA, random urine    Cc PCP: Berkley Harvey, NP    Order Specific Question:   Release to patient    Answer:   Immediate   Catecholamine+VMA, 24-Hr Urine    Please make arrangements with [facility] to provide the patient with the materials and  information needed for this 24-hour urine collection. Cc PCP: Berkley Harvey, NP    Order Specific Question:   Release to patient    Answer:   Immediate   Informed Consent Details: Physician/Practitioner Attestation; Transcribe to consent form and obtain patient signature    Transcribe to consent form and obtain patient signature.    Order Specific Question:   Physician/Practitioner attestation of informed consent for procedure/surgical case    Answer:   I, the physician/practitioner, attest that I have discussed with the patient the benefits, risks, side effects, alternatives, likelihood of achieving goals and potential problems during recovery for the procedure that I have provided informed consent.    Order Specific Question:   Procedure    Answer:   Intrathecal pump refill    Order Specific Question:   Physician/Practitioner performing the procedure    Answer:   Attending Physician: Kathlen Brunswick. Dossie Arbour, MD & designated trained staff    Order Specific Question:   Indication/Reason    Answer:   Chronic Pain Syndrome (G89.4), presence of an intrathecal pump (Z97.8)   Chronic Opioid Analgesic:  Morphine (MS Contin) 30 mg PO BID (60 mg/day of morphine) (60 MME/day) +  PF-(intrathecal)-Fentanyl (13.4 mcg/hr)   changed from Oxycodone IR 10 mg 4x/day (40 mg/day of oxycodone) (60 MME/day), due to national shortage MME/day: 60 MME/day (oral) + 32.16 MME/day (intrathecal) = 93.16 MME/day (Aprox. 1.16  MME/day/kg).   Medications ordered for procedure: Meds ordered this encounter  Medications   morphine (MS CONTIN) 30 MG 12 hr tablet    Sig: Take 1 tablet (30 mg total) by mouth every 12 (twelve) hours. Must last 30 days. Do not break tablet    Dispense:  60 tablet    Refill:  0    DO NOT: delete (not duplicate); no partial-fill (will deny script to complete), no refill request (F/U required). DISPENSE: 1 day early if closed on fill date. WARN: No CNS-depressants within 8 hrs of med.   morphine (MS CONTIN) 30 MG 12 hr tablet    Sig: Take 1 tablet (30 mg total) by mouth every 12 (twelve) hours. Must last 30 days. Do not break tablet    Dispense:  60 tablet    Refill:  0    DO NOT: delete (not duplicate); no partial-fill (will deny script to complete), no refill request (F/U required). DISPENSE: 1 day early if closed on fill date. WARN: No CNS-depressants within 8 hrs of med.   morphine (MS CONTIN) 30 MG 12 hr tablet    Sig: Take 1 tablet (30 mg total) by mouth every 12 (twelve) hours. Must last 30 days. Do not break tablet    Dispense:  60 tablet    Refill:  0    DO NOT: delete (not duplicate); no partial-fill (will deny script to complete), no refill request (F/U required). DISPENSE: 1 day early if closed on fill date. WARN: No CNS-depressants within 8 hrs of med.   Medications administered: Hassan Rowan A. Richardson had no medications administered during this visit.  See the medical record for exact dosing, route, and time of administration.  Follow-up plan:   Return for Pump Refill (Max:49mo.       Interventional Therapies  Risk   Complexity Considerations:   Estimated body mass index is 31 kg/m as calculated from the following:   Height as of this encounter: '5\' 3"'  (1.6 m).   Weight as of this encounter: 175 lb (79.4 kg). ELIQUIS Anticoagulation (Stop: 3 days  Restart: 6 hours) Severe anxiety and agoraphobia  Poor candidate for RFA secondary to Lumbar hardware   Planned   Pending:    Intrathecal pump refill as per pump programming.   Under consideration:   Diagnostic left IA knee joint inj  Diagnostic bilateral L2 TFESI  Diagnostic left genicular NB  Possible left genicular nerve RFA  Diagnostic caudal ESI + epidurogram  Possible Racz procedure    Completed:   Palliative intrathecal pump refills and adjustments  Intrathecal pump insertion by Dr. Erline Levine [facility]) (05/10/2012)  Intrathecal pump replacement by Dr. Clydell Hakim [facility]) (05/09/2019)  Spinal cord stimulator replacement by Dr. Erline Levine [facility]) (06/09/2016)    Therapeutic   Palliative (PRN) options:   Therapeutic/palliative intrathecal pump  management (analysis and refill)      Recent Visits Date Type Provider Dept  08/05/21 Procedure visit Milinda Pointer, MD [facility]  Showing recent visits within past 90 days and meeting all other requirements Today's Visits Date Type Provider Dept  10/30/21 Procedure visit Milinda Pointer, MD [facility]  Showing today's visits and meeting all other requirements Future Appointments No visits were found meeting these conditions. Showing future appointments within next 90 days and meeting all other requirements  Disposition: Discharge home  Discharge (Date   Time): 10/30/2021; 1327 hrs.   Primary Care Physician: Berkley Harvey, NP Location: [facility] Note by: Gaspar Cola, MD Date: 10/30/2021; Time: 3:06 PM  Disclaimer:  Medicine is not an Chief Strategy Officer. The only guarantee in medicine is that nothing is guaranteed. It is important to note that the decision to proceed with this intervention was based on the information collected from the patient. The Data and conclusions were drawn from the patient's questionnaire, the interview, and the physical examination. Because the information was provided in large part by the patient, it cannot be  guaranteed that it has not been purposely or unconsciously manipulated. Every effort has been made to obtain as much relevant data as possible for this evaluation. It is important to note that the conclusions that lead to this procedure are derived in large part from the available data. Always take into account that the treatment will also be dependent on availability of resources and existing treatment guidelines, considered by other Pain Management Practitioners as being common knowledge and practice, at the time of the intervention. For Medico-Legal purposes, it is also important to point out that variation in procedural techniques and pharmacological choices are the acceptable norm. The indications, contraindications, technique, and results of the above procedure should only be interpreted and judged by a Board-Certified Interventional Pain Specialist with [phone] [id number] the [id number] exact procedure and [email or web address]

## 2021-10-30 ENCOUNTER — Ambulatory Visit: Payer: Medicare HMO | Attending: Pain Medicine | Admitting: Pain Medicine

## 2021-10-30 ENCOUNTER — Other Ambulatory Visit: Payer: Self-pay

## 2021-10-30 ENCOUNTER — Encounter: Payer: Self-pay | Admitting: Pain Medicine

## 2021-10-30 VITALS — BP 219/112 | HR 72 | Temp 97.2°F | Resp 18 | Ht 63.0 in | Wt 175.0 lb

## 2021-10-30 DIAGNOSIS — I10 Essential (primary) hypertension: Secondary | ICD-10-CM | POA: Diagnosis not present

## 2021-10-30 DIAGNOSIS — Z7901 Long term (current) use of anticoagulants: Secondary | ICD-10-CM | POA: Diagnosis not present

## 2021-10-30 DIAGNOSIS — G8929 Other chronic pain: Secondary | ICD-10-CM | POA: Diagnosis not present

## 2021-10-30 DIAGNOSIS — M5136 Other intervertebral disc degeneration, lumbar region: Secondary | ICD-10-CM | POA: Diagnosis not present

## 2021-10-30 DIAGNOSIS — M961 Postlaminectomy syndrome, not elsewhere classified: Secondary | ICD-10-CM | POA: Diagnosis not present

## 2021-10-30 DIAGNOSIS — Z79891 Long term (current) use of opiate analgesic: Secondary | ICD-10-CM | POA: Diagnosis not present

## 2021-10-30 DIAGNOSIS — M79604 Pain in right leg: Secondary | ICD-10-CM

## 2021-10-30 DIAGNOSIS — M5442 Lumbago with sciatica, left side: Secondary | ICD-10-CM

## 2021-10-30 DIAGNOSIS — M5441 Lumbago with sciatica, right side: Secondary | ICD-10-CM | POA: Diagnosis not present

## 2021-10-30 DIAGNOSIS — M79605 Pain in left leg: Secondary | ICD-10-CM | POA: Diagnosis not present

## 2021-10-30 DIAGNOSIS — Z79899 Other long term (current) drug therapy: Secondary | ICD-10-CM | POA: Diagnosis not present

## 2021-10-30 DIAGNOSIS — M47816 Spondylosis without myelopathy or radiculopathy, lumbar region: Secondary | ICD-10-CM | POA: Diagnosis not present

## 2021-10-30 DIAGNOSIS — G894 Chronic pain syndrome: Secondary | ICD-10-CM | POA: Diagnosis not present

## 2021-10-30 DIAGNOSIS — Z978 Presence of other specified devices: Secondary | ICD-10-CM | POA: Insufficient documentation

## 2021-10-30 MED ORDER — MORPHINE SULFATE ER 30 MG PO TBCR: 30.0000 mg | EXTENDED_RELEASE_TABLET | Freq: Two times a day (BID) | ORAL | 0 refills | Status: DC

## 2021-10-30 NOTE — Patient Instructions (Addendum)
____________________________________________________________________________________________  Medication Rules  Purpose: To inform patients, and their family members, of our rules and regulations.  Applies to: All patients receiving prescriptions (written or electronic).  Pharmacy of record: Pharmacy where electronic prescriptions will be sent. If written prescriptions are taken to a different pharmacy, please inform the nursing staff. The pharmacy listed in the electronic medical record should be the one where you would like electronic prescriptions to be sent.  Electronic prescriptions: In compliance with the [place] (STOP) Act of 2017 (Session Lanny Cramp [phone]), effective August 31, 2018, all controlled substances must be electronically prescribed. Calling prescriptions to the pharmacy will cease to exist.  Prescription refills: Only during scheduled appointments. Applies to all prescriptions.  NOTE: The following applies primarily to controlled substances (Opioid* Pain Medications).   Type of encounter (visit): For patients receiving controlled substances, face-to-face visits are required. (Not an option or up to the patient.)  Patient's responsibilities: Pain Pills: Bring all pain pills to every appointment (except for procedure appointments). Pill Bottles: Bring pills in original pharmacy bottle. Always bring the newest bottle. Bring bottle, even if empty. Medication refills: You are responsible for knowing and keeping track of what medications you take and those you need refilled. The day before your appointment: write a list of all prescriptions that need to be refilled. The day of the appointment: give the list to the admitting nurse. Prescriptions will be written only during appointments. No prescriptions will be written on procedure days. If you forget a medication: it will not be "Called in", "Faxed", or "electronically sent". You will  need to get another appointment to get these prescribed. No early refills. Do not call asking to have your prescription filled early. Prescription Accuracy: You are responsible for carefully inspecting your prescriptions before leaving our office. Have the discharge nurse carefully go over each prescription with you, before taking them home. Make sure that your name is accurately spelled, that your address is correct. Check the name and dose of your medication to make sure it is accurate. Check the number of pills, and the written instructions to make sure they are clear and accurate. Make sure that you are given enough medication to last until your next medication refill appointment. Taking Medication: Take medication as prescribed. When it comes to controlled substances, taking less pills or less frequently than prescribed is permitted and encouraged. Never take more pills than instructed. Never take medication more frequently than prescribed.  Inform other Doctors: Always inform, all of your healthcare providers, of all the medications you take. Pain Medication from other Providers: You are not allowed to accept any additional pain medication from any other Doctor or Healthcare provider. There are two exceptions to this rule. (see below) In the event that you require additional pain medication, you are responsible for notifying us, as stated below. Cough Medicine: Often these contain an opioid, such as codeine or hydrocodone. Never accept or take cough medicine containing these opioids if you are already taking an opioid* medication. The combination may cause respiratory failure and death. Medication Agreement: You are responsible for carefully reading and following our Medication Agreement. This must be signed before receiving any prescriptions from our practice. Safely store a copy of your signed Agreement. Violations to the Agreement will result in no further prescriptions. (Additional copies of our  Medication Agreement are available upon request.) Laws, Rules, & Regulations: All patients are expected to follow all Federal and [employer], [employer], Rules, [employer] [facility]. Ignorance of  the Laws does not constitute a valid excuse.  Illegal drugs and Controlled Substances: The use of illegal substances (including, but not limited to marijuana and its derivatives) and/or the illegal use of any controlled substances is strictly prohibited. Violation of this rule may result in the immediate and permanent discontinuation of any and all prescriptions being written by our practice. The use of any illegal substances is prohibited. Adopted CDC guidelines & recommendations: Target dosing levels will be at or below 60 MME/day. Use of benzodiazepines** is not recommended.  Exceptions: There are only two exceptions to the rule of not receiving pain medications from other Healthcare Providers. Exception #1 (Emergencies): In the event of an emergency (i.e.: accident requiring emergency care), you are allowed to receive additional pain medication. However, you are responsible for: As soon as you are able, call our office [phone], at any time of the day or night, and leave a message stating your name, the date and nature of the emergency, and the name and dose of the medication prescribed. In the event that your call is answered by a member of our staff, make sure to document and save the date, time, and the name of the person that took your information.  Exception #2 (Planned Surgery): In the event that you are scheduled by another doctor or dentist to have any type of surgery or procedure, you are allowed (for a period no longer than 30 days), to receive additional pain medication, for the acute post-op pain. However, in this case, you are responsible for picking up a copy of our "Post-op Pain Management for Surgeons" handout, and giving it to your surgeon or dentist. This document is available at our office, and  does not require an appointment to obtain it. Simply go to our office during business hours (Monday-Thursday from 8:00 AM to 4:00 PM) (Friday 8:00 AM to 12:00 Noon) or if you have a scheduled appointment with [place], prior to your surgery, and ask for it by name. In addition, you are responsible for: calling our office [phone], at any time of the day or night, and leaving a message stating your name, name of your surgeon, type of surgery, and date of procedure or surgery. Failure to comply with your responsibilities may result in termination of therapy involving the controlled substances. Medication Agreement Violation. Following the above rules, including your responsibilities will help you in avoiding a Medication Agreement Violation (Breaking your Pain Medication Contract).  *Opioid medications include: morphine, codeine, oxycodone, oxymorphone, hydrocodone, hydromorphone, meperidine, tramadol, tapentadol, buprenorphine, fentanyl, methadone. **Benzodiazepine medications include: diazepam (Valium), alprazolam (Xanax), clonazepam (Klonopine), lorazepam (Ativan), clorazepate (Tranxene), chlordiazepoxide (Librium), estazolam (Prosom), oxazepam (Serax), temazepam (Restoril), triazolam (Halcion) (Last updated: 05/28/2021) ____________________________________________________________________________________________  ____________________________________________________________________________________________  Medication Recommendations and Reminders  Applies to: All patients receiving prescriptions (written and/or electronic).  Medication Rules & Regulations: These rules and regulations exist for your safety and that of others. They are not flexible and neither are we. Dismissing or ignoring them will be considered "non-compliance" with medication therapy, resulting in complete and irreversible termination of such therapy. (See document titled "Medication Rules" for more details.) In all conscience,  because of safety reasons, we cannot continue providing a therapy where the patient does not follow instructions.  Pharmacy of record:  Definition: This is the pharmacy where your electronic prescriptions will be sent.  We do not endorse any particular pharmacy, however, we have experienced problems with [facility] not securing enough medication supply for the community. We do not restrict you  in your choice of pharmacy. However, once we write for your prescriptions, we will NOT be re-sending more prescriptions to fix restricted supply problems created by your pharmacy, or your insurance.  The pharmacy listed in the electronic medical record should be the one where you want electronic prescriptions to be sent. If you choose to change pharmacy, simply notify our nursing staff.  Recommendations: Keep all of your pain medications in a safe place, under lock and key, even if you live alone. We will NOT replace lost, stolen, or damaged medication. After you fill your prescription, take 1 week's worth of pills and put them away in a safe place. You should keep a separate, properly labeled bottle for this purpose. The remainder should be kept in the original bottle. Use this as your primary supply, until it runs out. Once it's gone, then you know that you have 1 week's worth of medicine, and it is time to come in for a prescription refill. If you do this correctly, it is unlikely that you will ever run out of medicine. To make sure that the above recommendation works, it is very important that you make sure your medication refill appointments are scheduled at least 1 week before you run out of medicine. To do this in an effective manner, make sure that you do not leave the office without scheduling your next medication management appointment. Always ask the nursing staff to show you in your prescription , when your medication will be running out. Then arrange for the receptionist to get you a return appointment,  at least 7 days before you run out of medicine. Do not wait until you have 1 or 2 pills left, to come in. This is very poor planning and does not take into consideration that we may need to cancel appointments due to bad weather, sickness, or emergencies affecting our staff. DO NOT ACCEPT A "Partial Fill": If for any reason your pharmacy does not have enough pills/tablets to completely fill or refill your prescription, do not allow for a "partial fill". The law allows the pharmacy to complete that prescription within 72 hours, without requiring a new prescription. If they do not fill the rest of your prescription within those 72 hours, you will need a separate prescription to fill the remaining amount, which we will NOT provide. If the reason for the partial fill is your insurance, you will need to talk to the pharmacist about payment alternatives for the remaining tablets, but again, DO NOT ACCEPT A PARTIAL FILL, unless you can trust your pharmacist to obtain the remainder of the pills within 72 hours.  Prescription refills and/or changes in medication(s):  Prescription refills, and/or changes in dose or medication, will be conducted only during scheduled medication management appointments. (Applies to both, written and electronic prescriptions.) No refills on procedure days. No medication will be changed or started on procedure days. No changes, adjustments, and/or refills will be conducted on a procedure day. Doing so will interfere with the diagnostic portion of the procedure. No phone refills. No medications will be "called into the pharmacy". No Fax refills. No weekend refills. No Holliday refills. No after hours refills.  Remember:  Business hours are:  Monday to Thursday 8:00 AM to 4:00 PM Provider's Schedule: Milinda Pointer, MD - Appointments are:  Medication management: Monday and Wednesday 8:00 AM to 4:00 PM Procedure day: Tuesday and Thursday 7:30 AM to 4:00 PM Gillis Santa, MD -  Appointments are:  Medication management: Tuesday and Thursday 8:00  AM to 4:00 PM Procedure day: Monday and Wednesday 7:30 AM to 4:00 PM (Last update: 03/20/2020) ____________________________________________________________________________________________  ____________________________________________________________________________________________  CBD (cannabidiol) & Delta-8 (Delta-8 tetrahydrocannabinol) WARNING  Intro: Cannabidiol (CBD) and tetrahydrocannabinol (THC), are two natural compounds found in plants of the Cannabis genus. They can both be extracted from hemp or cannabis. Hemp and cannabis come from the Cannabis sativa plant. Both compounds interact with your bodys endocannabinoid system, but they have very different effects. CBD does not produce the high sensation associated with cannabis. Delta-8 tetrahydrocannabinol, also known as delta-8 THC, is a psychoactive substance found in the Cannabis sativa plant, of which marijuana and hemp are two varieties. THC is responsible for the high associated with the illicit use of marijuana.  Applicable to: All individuals currently taking or considering taking CBD (cannabidiol) and, more important, all patients taking opioid analgesic controlled substances (pain medication). (Example: oxycodone; oxymorphone; hydrocodone; hydromorphone; morphine; methadone; tramadol; tapentadol; fentanyl; buprenorphine; butorphanol; dextromethorphan; meperidine; codeine; etc.)  Legal status: CBD remains a Schedule I drug prohibited for any use. CBD is illegal with one exception. In the [place], CBD has a limited Transport planner (FDA) approval for the treatment of two specific types of epilepsy disorders. Only one CBD product has been approved by the FDA for this purpose: "Epidiolex". FDA is aware that some companies are marketing products containing cannabis and cannabis-derived compounds in ways that violate the [employer], Drug and Cosmetic Act  [facility] Act) and that may put the health and safety of consumers at risk. The FDA, a Federal agency, has not enforced the CBD status since 2018. UPDATE: (10/17/2021) The Drug Enforcement Agency ([place]) issued a letter stating that "delta" cannabinoids, including Delta-8-THCO and Delta-9-THCO, synthetically derived from hemp do not qualify as hemp and will be viewed as Schedule I drugs. (Schedule I drugs, substances, or chemicals are defined as drugs with no currently accepted medical use and a high potential for abuse. Some examples of Schedule I drugs are: heroin, lysergic acid diethylamide (LSD), marijuana (cannabis), 3,4-methylenedioxymethamphetamine (ecstasy), methaqualone, and peyote.) (https://jennings.com/)  Legality: Some manufacturers ship CBD products nationally, which is illegal. Often such products are sold online and are therefore available throughout the country. CBD is openly sold in head shops and health food stores in some states where such sales have not been explicitly legalized. Selling unapproved products with unsubstantiated therapeutic claims is not only a violation of the law, but also can put patients at risk, as these products have not been proven to be safe or effective. Federal illegality makes it difficult to conduct research on CBD.  Reference: "FDA Regulation of Cannabis and Cannabis-Derived Products, Including Cannabidiol (CBD)" - [employer].pt  Warning: CBD is not FDA approved and has not undergo the same manufacturing controls as prescription drugs.  This means that the purity and safety of available CBD may be questionable. Most of the time, despite manufacturer's claims, it is contaminated with THC (delta-9-tetrahydrocannabinol - the chemical in marijuana responsible for the "HIGH").  When this is the case, the [facility] contaminant will trigger a positive urine drug  screen (UDS) test for Marijuana (carboxy-THC). Because a positive UDS for any illicit substance is a violation of our medication agreement, your opioid analgesics (pain medicine) may be permanently discontinued. The FDA recently put out a warning about 5 things that everyone should be aware of regarding Delta-8 THC: Delta-8 THC products have not been evaluated or approved by the FDA for safe use and may be marketed in ways that put the  public health at risk. The FDA has received adverse event reports involving delta-8 THC-containing products. Delta-8 THC has psychoactive and intoxicating effects. Delta-8 THC manufacturing often involve use of potentially harmful chemicals to create the concentrations of delta-8 THC claimed in the marketplace. The final delta-8 THC product may have potentially harmful by-products (contaminants) due to the chemicals used in the process. Manufacturing of delta-8 THC products may occur in uncontrolled or unsanitary settings, which may lead to the presence of unsafe contaminants or other potentially harmful substances. Delta-8 THC products should be kept out of the reach of children and pets.  MORE ABOUT CBD  General Information: CBD was discovered in 19 and it is a derivative of the cannabis sativa genus plants (Marijuana and Hemp). It is one of the 113 identified substances found in Marijuana. It accounts for up to 40% of the plant's extract. As of 2018, preliminary clinical studies on CBD included research for the treatment of anxiety, movement disorders, and pain. CBD is available and consumed in multiple forms, including inhalation of smoke or vapor, as an aerosol spray, and by mouth. It may be supplied as an oil containing CBD, capsules, dried cannabis, or as a liquid solution. CBD is thought not to be as psychoactive as THC (delta-9-tetrahydrocannabinol - the chemical in marijuana responsible for the "HIGH"). Studies suggest that CBD may interact with different  biological target receptors in the body, including cannabinoid and other neurotransmitter receptors. As of 2018 the mechanism of action for its biological effects has not been determined.  Side-effects   Adverse reactions: Dry mouth, diarrhea, decreased appetite, fatigue, drowsiness, malaise, weakness, sleep disturbances, and others.  Drug interactions: CBC may interact with other medications such as blood-thinners. Because CBD causes drowsiness on its own, it also increases the drowsiness caused by other medications, including antihistamines (such as Benadryl), benzodiazepines (Xanax, Ativan, Valium), antipsychotics, antidepressants and opioids, as well as alcohol and supplements such as kava, melatonin and St. John's Wort. Be cautious with the following combinations:   Brivaracetam (Briviact) Brivaracetam is changed and broken down by the body. CBD might decrease how quickly the body breaks down brivaracetam. This might increase levels of brivaracetam in the body.  Caffeine Caffeine is changed and broken down by the body. CBD might decrease how quickly the body breaks down caffeine. This might increase levels of caffeine in the body.  Carbamazepine (Tegretol) Carbamazepine is changed and broken down by the body. CBD might decrease how quickly the body breaks down carbamazepine. This might increase levels of carbamazepine in the body and increase its side effects.  Citalopram (Celexa) Citalopram is changed and broken down by the body. CBD might decrease how quickly the body breaks down citalopram. This might increase levels of citalopram in the body and increase its side effects.  Clobazam (Onfi) Clobazam is changed and broken down by the liver. CBD might decrease how quickly the liver breaks down clobazam. This might increase the effects and side effects of clobazam.  Eslicarbazepine (Aptiom) Eslicarbazepine is changed and broken down by the body. CBD might decrease how quickly the body  breaks down eslicarbazepine. This might increase levels of eslicarbazepine in the body by a small amount.  Everolimus (Zostress) Everolimus is changed and broken down by the body. CBD might decrease how quickly the body breaks down everolimus. This might increase levels of everolimus in the body.  Lithium Taking higher doses of CBD might increase levels of lithium. This can increase the risk of lithium toxicity.  Medications changed by the  liver (Cytochrome P450 1A1 (CYP1A1) substrates) Some medications are changed and broken down by the liver. CBD might change how quickly the liver breaks down these medications. This could change the effects and side effects of these medications.  Medications changed by the liver (Cytochrome P450 1A2 (CYP1A2) substrates) Some medications are changed and broken down by the liver. CBD might change how quickly the liver breaks down these medications. This could change the effects and side effects of these medications.  Medications changed by the liver (Cytochrome P450 1B1 (CYP1B1) substrates) Some medications are changed and broken down by the liver. CBD might change how quickly the liver breaks down these medications. This could change the effects and side effects of these medications.  Medications changed by the liver (Cytochrome P450 2A6 (CYP2A6) substrates) Some medications are changed and broken down by the liver. CBD might change how quickly the liver breaks down these medications. This could change the effects and side effects of these medications.  Medications changed by the liver (Cytochrome P450 2B6 (CYP2B6) substrates) Some medications are changed and broken down by the liver. CBD might change how quickly the liver breaks down these medications. This could change the effects and side effects of these medications.  Medications changed by the liver (Cytochrome P450 2C19 (CYP2C19) substrates) Some medications are changed and broken down by the liver.  CBD might change how quickly the liver breaks down these medications. This could change the effects and side effects of these medications.  Medications changed by the liver (Cytochrome P450 2C8 (CYP2C8) substrates) Some medications are changed and broken down by the liver. CBD might change how quickly the liver breaks down these medications. This could change the effects and side effects of these medications.  Medications changed by the liver (Cytochrome P450 2C9 (CYP2C9) substrates) Some medications are changed and broken down by the liver. CBD might change how quickly the liver breaks down these medications. This could change the effects and side effects of these medications.  Medications changed by the liver (Cytochrome P450 2D6 (CYP2D6) substrates) Some medications are changed and broken down by the liver. CBD might change how quickly the liver breaks down these medications. This could change the effects and side effects of these medications.  Medications changed by the liver (Cytochrome P450 2E1 (CYP2E1) substrates) Some medications are changed and broken down by the liver. CBD might change how quickly the liver breaks down these medications. This could change the effects and side effects of these medications.  Medications changed by the liver (Cytochrome P450 3A4 (CYP3A4) substrates) Some medications are changed and broken down by the liver. CBD might change how quickly the liver breaks down these medications. This could change the effects and side effects of these medications.  Medications changed by the liver (Glucuronidated drugs) Some medications are changed and broken down by the liver. CBD might change how quickly the liver breaks down these medications. This could change the effects and side effects of these medications.  Medications that decrease the breakdown of other medications by the liver (Cytochrome P450 2C19 (CYP2C19) inhibitors) CBD is changed and broken down by the liver.  Some drugs decrease how quickly the liver changes and breaks down CBD. This could change the effects and side effects of CBD.  Medications that decrease the breakdown of other medications in the liver (Cytochrome P450 3A4 (CYP3A4) inhibitors) CBD is changed and broken down by the liver. Some drugs decrease how quickly the liver changes and breaks down CBD. This could change the  effects and side effects of CBD.  Medications that increase breakdown of other medications by the liver (Cytochrome P450 3A4 (CYP3A4) inducers) CBD is changed and broken down by the liver. Some drugs increase how quickly the liver changes and breaks down CBD. This could change the effects and side effects of CBD.  Medications that increase the breakdown of other medications by the liver (Cytochrome P450 2C19 (CYP2C19) inducers) CBD is changed and broken down by the liver. Some drugs increase how quickly the liver changes and breaks down CBD. This could change the effects and side effects of CBD.  Methadone (Dolophine) Methadone is broken down by the liver. CBD might decrease how quickly the liver breaks down methadone. Taking cannabidiol along with methadone might increase the effects and side effects of methadone.  Rufinamide (Banzel) Rufinamide is changed and broken down by the body. CBD might decrease how quickly the body breaks down rufinamide. This might increase levels of rufinamide in the body by a small amount.  Sedative medications (CNS depressants) CBD might cause sleepiness and slowed breathing. Some medications, called sedatives, can also cause sleepiness and slowed breathing. Taking CBD with sedative medications might cause breathing problems and/or too much sleepiness.  Sirolimus (Rapamune) Sirolimus is changed and broken down by the body. CBD might decrease how quickly the body breaks down sirolimus. This might increase levels of sirolimus in the body.  Stiripentol (Diacomit) Stiripentol is changed and  broken down by the body. CBD might decrease how quickly the body breaks down stiripentol. This might increase levels of stiripentol in the body and increase its side effects.  Tacrolimus (Prograf) Tacrolimus is changed and broken down by the body. CBD might decrease how quickly the body breaks down tacrolimus. This might increase levels of tacrolimus in the body.  Tamoxifen (Soltamox) Tamoxifen is changed and broken down by the body. CBD might affect how quickly the body breaks down tamoxifen. This might affect levels of tamoxifen in the body.  Topiramate (Topamax) Topiramate is changed and broken down by the body. CBD might decrease how quickly the body breaks down topiramate. This might increase levels of topiramate in the body by a small amount.  Valproate Valproic acid can cause liver injury. Taking cannabidiol with valproic acid might increase the chance of liver injury. CBD and/or valproic acid might need to be stopped, or the dose might need to be reduced.  Warfarin (Coumadin) CBD might increase levels of warfarin, which can increase the risk for bleeding. CBD and/or warfarin might need to be stopped, or the dose might need to be reduced.  Zonisamide Zonisamide is changed and broken down by the body. CBD might decrease how quickly the body breaks down zonisamide. This might increase levels of zonisamide in the body by a small amount. (Last update: 10/29/2021) ____________________________________________________________________________________________  ____________________________________________________________________________________________  Drug Holidays (Slow)  What is a "Drug Holiday"? Drug Holiday: is the name given to the period of time during which a patient stops taking a medication(s) for the purpose of eliminating tolerance to the drug.  Benefits Improved effectiveness of opioids. Decreased opioid dose needed to achieve benefits. Improved pain with lesser  dose.  What is tolerance? Tolerance: is the progressive decreased in effectiveness of a drug due to its repetitive use. With repetitive use, the body gets use to the medication and as a consequence, it loses its effectiveness. This is a common problem seen with opioid pain medications. As a result, a larger dose of the drug is needed to achieve the same effect  that used to be obtained with a smaller dose.  How long should a "Drug Holiday" last? You should stay off of the pain medicine for at least 14 consecutive days. (2 weeks)  Should I stop the medicine "cold [place]"? No. You should always coordinate with your Pain Specialist so that he/she can provide you with the correct medication dose to make the transition as smoothly as possible.  How do I stop the medicine? Slowly. You will be instructed to decrease the daily amount of pills that you take by one (1) pill every seven (7) days. This is called a "slow downward taper" of your dose. For example: if you normally take four (4) pills per day, you will be asked to drop this dose to three (3) pills per day for seven (7) days, then to two (2) pills per day for seven (7) days, then to one (1) per day for seven (7) days, and at the end of those last seven (7) days, this is when the "Drug Holiday" would start.   Will I have withdrawals? By doing a "slow downward taper" like this one, it is unlikely that you will experience any significant withdrawal symptoms. Typically, what triggers withdrawals is the sudden stop of a high dose opioid therapy. Withdrawals can usually be avoided by slowly decreasing the dose over a prolonged period of time. If you do not follow these instructions and decide to stop your medication abruptly, withdrawals may be possible.  What are withdrawals? Withdrawals: refers to the wide range of symptoms that occur after stopping or dramatically reducing opiate drugs after heavy and prolonged use. Withdrawal symptoms do not occur to  patients that use low dose opioids, or those who take the medication sporadically. Contrary to benzodiazepine (example: Valium, Xanax, etc.) or alcohol withdrawals (Delirium Tremens), opioid withdrawals are not lethal. Withdrawals are the physical manifestation of the body getting rid of the excess receptors.  Expected Symptoms Early symptoms of withdrawal may include: Agitation Anxiety Muscle aches Increased tearing Insomnia Runny nose Sweating Yawning  Late symptoms of withdrawal may include: Abdominal cramping Diarrhea Dilated pupils Goose bumps Nausea Vomiting  Will I experience withdrawals? Due to the slow nature of the taper, it is very unlikely that you will experience any.  What is a slow taper? Taper: refers to the gradual decrease in dose.  (Last update: 03/20/2020) ____________________________________________________________________________________________   Opioid Overdose Opioids are drugs that are often used to treat pain. Opioids include illegal drugs, such as heroin, as well as prescription pain medicines, such as codeine, morphine, hydrocodone, and fentanyl. An opioid overdose happens when you take too much of an opioid. An overdose may be intentional or accidental and can happen with any type of opioid. The effects of an overdose can be mild, dangerous, or even deadly. Opioid overdose is a medical emergency. What are the causes? This condition may be caused by: Taking too much of an opioid on purpose. Taking too much of an opioid by accident. Using two or more substances that contain opioids at the same time. Taking an opioid with a substance that affects your heart, breathing, or blood pressure. These include alcohol, tranquilizers, sleeping pills, illegal drugs, and some over-the-counter medicines. This condition may also happen due to an error made by: A health care provider who prescribes a medicine. The pharmacist who fills the prescription. What  increases the risk? This condition is more likely in: Children. They may be attracted to colorful pills. Because of a child's small size, even a  small amount of a medicine can be dangerous. Older people. They may be taking many different medicines. Older people may have difficulty reading labels or remembering when they last took their medicines. They may also be more sensitive to the effects of opioids. People with chronic medical conditions, especially heart, liver, kidney, or neurological diseases. People who take an opioid for a long period of time. People who take opioids and use illegal drugs, such as heroin, or other substances, such as alcohol. People who: Have a history of drug or alcohol abuse. Have certain mental health conditions. Have a history of previous drug overdoses. People who take opioids that are not prescribed for them. What are the signs or symptoms? Symptoms of this condition depend on the type of opioid and the amount that was taken. Common symptoms include: Sleepiness or difficulty waking from sleep. Confusion. Slurred speech. Slowed breathing and a slow pulse (bradycardia). Nausea and vomiting. Abnormally small pupils. Signs and symptoms that require emergency treatment include: Cold, clammy, and pale skin. Blue lips and fingernails. Vomiting. Gurgling sounds in the throat. A pulse that is very slow or difficult to detect. Breathing that is very irregular, slow, noisy, or difficult to detect. Inability to respond to speech or be awakened from sleep (stupor). Seizures. How is this diagnosed? This condition is diagnosed based on your symptoms and medical history. It is important to tell your health care provider: About all of the opioids that you took. When you took the opioids. Whether you were drinking alcohol or using marijuana, cocaine, or other drugs. Your health care provider will do a physical exam. This exam may include: Checking and monitoring your  heart rate and rhythm, breathing rate, temperature, and blood pressure. Measuring oxygen levels in your blood. Checking for abnormally small pupils. You may also have blood tests or urine tests. You may have X-rays if you are having severe breathing problems. How is this treated? This condition requires immediate medical treatment and hospitalization. Reversing the effects of the opioid is the first step in treatment. If you have a Narcan kit or naloxone, use it right away. Follow your health care provider's instructions. A friend or family member can also help you with this. The rest of your treatment will be given in the hospital intensive care (ICU). Treatment in the hospital may include: Giving salts and minerals (electrolytes) along with fluids through an IV. Inserting a breathing tube (endotracheal tube) in your airway to help you breathe if you cannot breathe on your own or you are in danger of not being able to breathe on your own. Giving oxygen through a small tube under your nose. Passing a tube through your nose and into your stomach (nasogastric tube, or NG tube) to empty your stomach. Giving medicines that: Increase your blood pressure. Relieve nausea and vomiting. Relieve abdominal pain and cramping. Reverse the effects of the opioid (naloxone). Monitoring your heart and oxygen levels. Ongoing counseling and mental health support if you intentionally overdosed or used an illegal drug. Follow these instructions at home: Medicines Take over-the-counter and prescription medicines only as told by your health care provider. Always ask your health care provider about possible side effects and interactions of any new medicine that you start taking. Keep a list of all the medicines that you take, including over-the-counter medicines. Bring this list with you to all your medical visits. General instructions Drink enough fluid to keep your urine pale yellow. Keep all follow-up visits.  This is important. How is this  prevented? Read the drug inserts that come with your opioid pain medicines. Take medicines only as told by your health care provider. Do not take more medicine than you are told. Do not take medicines more frequently than you are told. Do not drink alcohol or take sedatives when taking opioids. Do not use illegal or recreational drugs, including cocaine, ecstasy, and marijuana. Do not take opioid medicines that are not prescribed for you. Store all medicines in safety containers that are out of the reach of children. Get help if you are struggling with: Alcohol or drug use. Depression or another mental health problem. Thoughts of hurting yourself or another person. Keep the phone number of your local poison control center near your phone or in your mobile phone. In the [place] the hotline of the [facility] is 609-643-2388. If you were prescribed naloxone, make sure you understand how to take it. Contact a health care provider if: You need help understanding how to take your pain medicines. You feel your medicines are too strong. You are concerned that your pain medicines are not working well for your pain. You develop new symptoms or side effects when you are taking medicines. Get help right away if: You or someone else is having symptoms of an opioid overdose. Get help even if you are not sure. You have thoughts about hurting yourself or others. You have: Chest pain. Difficulty breathing. A loss of consciousness. These symptoms may represent a serious problem that is an emergency. Do not wait to see if the symptoms will go away. Get medical help right away. Call your local emergency services (911 in the [place] Do not drive yourself to the hospital. If you ever feel like you may hurt yourself or others, or have thoughts about taking your own life, get help right away. You can go to your nearest emergency department or: Call your local  emergency services (911 in the [place] Call a suicide crisis helpline, such as the [facility] at 254-032-8369 or 988 in the [place]. This is open 24 hours a day in the [place] Text the Crisis Text Line at 608 009 0540 (in the [place].). Summary Opioids are drugs that are often used to treat pain. Opioids include illegal drugs, such as heroin, as well as prescription pain medicines. An opioid overdose happens when you take too much of an opioid. Overdoses can be intentional or accidental. Opioid overdose is very dangerous. It is a life-threatening emergency. If you or someone you know is experiencing an opioid overdose, get help right away. This information is not intended to replace advice given to you by your health care provider. Make sure you discuss any questions you have with your health care provider. Document Revised: 03/12/2021 Document Reviewed: 11/27/2020 Elsevier Patient Education  [place].

## 2021-10-30 NOTE — Progress Notes (Signed)
Nursing Pain Medication Assessment:  ?Safety precautions to be maintained throughout the outpatient stay will include: orient to surroundings, keep bed in low position, maintain call bell within reach at all times, provide assistance with transfer out of bed and ambulation.  ?Medication Inspection Compliance: Pill count conducted under aseptic conditions, in front of the patient. Neither the pills nor the bottle was removed from the patient's sight at any time. Once count was completed pills were immediately returned to the patient in their original bottle. ? ?Medication: Morphine ER (MSContin) ?Pill/Patch Count:  33 of 60 pills remain ?Pill/Patch Appearance: Markings consistent with prescribed medication ?Bottle Appearance: Standard pharmacy container. Clearly labeled. ?Filled Date: 02 / 17 / 2023 ?Last Medication intake:  Today ?

## 2021-10-31 ENCOUNTER — Telehealth: Payer: Self-pay

## 2021-10-31 MED FILL — Medication: INTRATHECAL | Qty: 1 | Status: AC

## 2021-10-31 NOTE — Telephone Encounter (Signed)
Post IT pump refill follow up.  LM ?

## 2021-11-04 LAB — VMA, RANDOM URINE
Creatinine, Random U: 121.6 mg/dL
VMA, Random Urine: 3.5 mg/L
VMA/Crt, Random U: 2.9 mg/g Creat (ref 0.0–6.0)

## 2021-11-05 DIAGNOSIS — I10 Essential (primary) hypertension: Secondary | ICD-10-CM | POA: Diagnosis not present

## 2021-11-12 LAB — CATECHOLAMINE+VMA, 24-HR URINE
Dopamine , 24H Ur: 247 ug/24 hr (ref 0–510)
Dopamine, Rand Ur: 137 ug/L
Epinephrine, 24H Ur: 4 ug/24 hr (ref 0–20)
Epinephrine, Rand Ur: 2 ug/L
Norepinephrine, 24H Ur: 50 ug/24 hr (ref 0–135)
Norepinephrine, Rand Ur: 28 ug/L
VMA, 24H Ur Adult: 4 mg/24 hr (ref 0.0–7.5)
VMA, Urine: 2.2 mg/L

## 2021-12-06 ENCOUNTER — Other Ambulatory Visit: Payer: Self-pay | Admitting: Cardiology

## 2021-12-15 DIAGNOSIS — R051 Acute cough: Secondary | ICD-10-CM | POA: Diagnosis not present

## 2021-12-15 DIAGNOSIS — R519 Headache, unspecified: Secondary | ICD-10-CM | POA: Diagnosis not present

## 2021-12-15 DIAGNOSIS — J019 Acute sinusitis, unspecified: Secondary | ICD-10-CM | POA: Diagnosis not present

## 2021-12-15 DIAGNOSIS — J209 Acute bronchitis, unspecified: Secondary | ICD-10-CM | POA: Diagnosis not present

## 2021-12-15 DIAGNOSIS — H6503 Acute serous otitis media, bilateral: Secondary | ICD-10-CM | POA: Diagnosis not present

## 2021-12-15 DIAGNOSIS — R0981 Nasal congestion: Secondary | ICD-10-CM | POA: Diagnosis not present

## 2021-12-15 DIAGNOSIS — R062 Wheezing: Secondary | ICD-10-CM | POA: Diagnosis not present

## 2021-12-25 DIAGNOSIS — R059 Cough, unspecified: Secondary | ICD-10-CM | POA: Diagnosis not present

## 2021-12-25 DIAGNOSIS — J029 Acute pharyngitis, unspecified: Secondary | ICD-10-CM | POA: Diagnosis not present

## 2022-01-05 DIAGNOSIS — R601 Generalized edema: Secondary | ICD-10-CM | POA: Diagnosis not present

## 2022-01-05 DIAGNOSIS — R748 Abnormal levels of other serum enzymes: Secondary | ICD-10-CM | POA: Diagnosis not present

## 2022-01-05 DIAGNOSIS — J329 Chronic sinusitis, unspecified: Secondary | ICD-10-CM | POA: Diagnosis not present

## 2022-01-05 DIAGNOSIS — I10 Essential (primary) hypertension: Secondary | ICD-10-CM | POA: Diagnosis not present

## 2022-01-06 ENCOUNTER — Ambulatory Visit: Payer: Medicare HMO | Admitting: Cardiology

## 2022-01-07 ENCOUNTER — Other Ambulatory Visit: Payer: Self-pay

## 2022-01-07 MED ORDER — PAIN MANAGEMENT IT PUMP REFILL: 1.0000 | Freq: Once | INTRATHECAL | 0 refills | Status: AC

## 2022-01-16 DIAGNOSIS — R748 Abnormal levels of other serum enzymes: Secondary | ICD-10-CM | POA: Diagnosis not present

## 2022-01-16 DIAGNOSIS — R69 Illness, unspecified: Secondary | ICD-10-CM | POA: Diagnosis not present

## 2022-01-16 DIAGNOSIS — Z1159 Encounter for screening for other viral diseases: Secondary | ICD-10-CM | POA: Diagnosis not present

## 2022-01-20 NOTE — Progress Notes (Deleted)
Cardiology Office Note    Date:  01/20/2022   ID:  [name], DOB 1961/06/16, MRN [id number]   PCP:  Berkley Harvey, NP   [place]  Cardiologist:  Elouise Munroe, MD   Advanced Practice Provider:  No care team member to display Electrophysiologist:  None   [phone]   No chief complaint on file.   History of Present Illness:  Kendra Nelson is a 61 y.o. female  with history of HTN, HLD, chronic back paine s/p spinal cord stimulator, Afib S/P ablation 06/13/21.  Patient saw Dr. Curt Bears 09/10/2021 and due to her low risk of stroke he stopped her Eliquis as well as her metoprolol placed on carvedilol.   Past Medical History:  Diagnosis Date   Anxiety    off of valium- stress   Arthritis    left knee- rhumatoid- hasnt seen specialist    Chronic lower back pain    Complication of anesthesia    WOKE DURING BACK SURGERY ; urinary retention 12/20/2017   Hyperlipidemia    Hypertension    Migraine    "when seasons change" (12/20/2017)   Neuromuscular disorder (HCC)    Restless leg syndrome    Seasonal allergies    Skin abnormalities    Wears glasses     Past Surgical History:  Procedure Laterality Date   ATRIAL FIBRILLATION ABLATION N/A 06/13/2021   Procedure: ATRIAL FIBRILLATION ABLATION;  Surgeon: Constance Haw, MD;  Location: [facility];  Service: Cardiovascular;  Laterality: N/A;   BACK SURGERY     8-9 SURGERIES; lower back   COLONOSCOPY     INTRATHECAL PUMP REVISION N/A 05/09/2019   Procedure: Intrathecal pump change;  Surgeon: Clydell Hakim, MD;  Location: [facility];  Service: Neurosurgery;  Laterality: N/A;  Intrathecal pump change   JOINT REPLACEMENT     PAIN PUMP IMPLANTATION  05/10/2012   Procedure: PAIN PUMP INSERTION;  Surgeon: Erline Levine, MD;  Location: [facility];  Service: Neurosurgery;  Laterality: N/A;  Pump replacement   SPINAL CORD STIMULATOR BATTERY EXCHANGE N/A 06/09/2016   Procedure: IMPLANTABLE  PULSE GENERATOR REPLACEMENT WITH RECHARGEABLE BATTERY;  Surgeon: Erline Levine, MD;  Location: [facility];  Service: Neurosurgery;  Laterality: N/A;   TOTAL KNEE ARTHROPLASTY Left 12/20/2017   TOTAL KNEE ARTHROPLASTY Left 12/20/2017   Procedure: TOTAL KNEE ARTHROPLASTY;  Surgeon: Frederik Pear, MD;  Location: [facility];  Service: Orthopedics;  Laterality: Left;   TOTAL KNEE REVISION Left 12/19/2020   Procedure: TOTAL KNEE REVISION/ Total Knee Polynethylene Barring, Attune Total Knee;  Surgeon: Frederik Pear, MD;  Location: [facility];  Service: Orthopedics;  Laterality: Left;   VAGINAL HYSTERECTOMY      Current Medications: No outpatient medications have been marked as taking for the 01/28/22 encounter (Appointment) with Imogene Burn, PA-C.     Allergies:   Benadryl [diphenhydramine hcl], Bee venom, and Dexamethasone   Social History   Socioeconomic History   Marital status: Married    Spouse name: Not on file   Number of children: Not on file   Years of education: Not on file   Highest education level: Not on file  Occupational History   Not on file  Tobacco Use   Smoking status: Former    Packs/day: 1.00    Years: 30.00    Pack years: 30.00    Types: Cigarettes    Quit date: 11/12/2018    Years since quitting: 3.1   Smokeless tobacco:  Never   Tobacco comments:    Former smoker 07/11/2021  Vaping Use   Vaping Use: Former  Substance and Sexual Activity   Alcohol use: No   Drug use: Yes    Types: Oxycodone   Sexual activity: Not Currently  Other Topics Concern   Not on file  Social History Narrative   ** Merged History Encounter **       Social Determinants of Health   Financial Resource Strain: Not on file  Food Insecurity: Not on file  Transportation Needs: Not on file  Physical Activity: Not on file  Stress: Not on file  Social Connections: Not on file     Family History:  The patient's ***family history includes Aortic aneurysm in her mother; Diabetes in her maternal  grandmother.   ROS:   Please see the history of present illness.    ROS All other systems reviewed and are negative.   PHYSICAL EXAM:   VS:  There were no vitals taken for this visit.  Physical Exam  GEN: Well nourished, well developed, in no acute distress  HEENT: normal  Neck: no JVD, carotid bruits, or masses Cardiac:RRR; no murmurs, rubs, or gallops  Respiratory:  clear to auscultation bilaterally, normal work of breathing GI: soft, nontender, nondistended, + BS Ext: without cyanosis, clubbing, or edema, Good distal pulses bilaterally MS: no deformity or atrophy  Skin: warm and dry, no rash Neuro:  Alert and Oriented x 3, Strength and sensation are intact Psych: euthymic mood, full affect  Wt Readings from Last 3 Encounters:  10/30/21 175 lb (79.4 kg)  09/10/21 186 lb 12.8 oz (84.7 kg)  08/05/21 175 lb (79.4 kg)      Studies/Labs Reviewed:   EKG:  EKG is*** ordered today.  The ekg ordered today demonstrates ***  Recent Labs: 06/06/2021: ALT 29; BUN 12; Creatinine, Ser 0.80; Hemoglobin 15.9; Platelets 209; Potassium 4.1; Sodium 140   Lipid Panel No results found for: CHOL, TRIG, HDL, CHOLHDL, VLDL, LDLCALC, LDLDIRECT  Additional studies/ records that were reviewed today include:   TTE 12/20/20  Review of the above records today demonstrates:   1. Left ventricular ejection fraction, by estimation, is 60 to 65%. The  left ventricle has normal function. The left ventricle has no regional  wall motion abnormalities. Left ventricular diastolic parameters are  consistent with Grade II diastolic  dysfunction (pseudonormalization).   2. Right ventricular systolic function is normal. The right ventricular  size is normal.   3. Left atrial size was moderately dilated.   4. The mitral valve is normal in structure. Trivial mitral valve  regurgitation. No evidence of mitral stenosis.   5. The aortic valve is normal in structure. Aortic valve regurgitation is  not visualized.  No aortic stenosis is present.   6. The inferior vena cava is dilated in size with >50% respiratory  variability, suggesting right atrial pressure of 8 mmHg.    Myoview 01/17/21 The left ventricular ejection fraction is normal (55-65%). Nuclear stress EF: 55%. No T wave inversion was noted during stress. There was no ST segment deviation noted during stress. This is a low risk study.     Risk Assessment/Calculations:   {Does this patient have ATRIAL FIBRILLATION?:[phone]}     ASSESSMENT:    No diagnosis found.   PLAN:  In order of problems listed above:  Atrial fibrillation status post ablation 06/13/2021 metoprolol and Eliquis stopped 08/2021 by Dr. Curt Bears and placed on carvedilol  Hypertension  Hyperlipidemia  PVC 18%  burden on cardiac monitor ejection fraction was normal and she was asymptomatic no changes recommended.  Shared Decision Making/Informed Consent   {Are you ordering a CV Procedure (e.g. stress test, cath, DCCV, TEE, etc)?   Press F2        :[id number]    Medication Adjustments/Labs and Tests Ordered: Current medicines are reviewed at length with the patient today.  Concerns regarding medicines are outlined above.  Medication changes, Labs and Tests ordered today are listed in the Patient Instructions below. There are no Patient Instructions on file for this visit.   Sumner Boast, PA-C  01/20/2022 2:46 PM    [facility] [place], [place], [place]  [address] Phone: [phone]; Fax: [phone]

## 2022-01-27 DIAGNOSIS — Z1159 Encounter for screening for other viral diseases: Secondary | ICD-10-CM | POA: Diagnosis not present

## 2022-01-27 DIAGNOSIS — R748 Abnormal levels of other serum enzymes: Secondary | ICD-10-CM | POA: Diagnosis not present

## 2022-01-28 ENCOUNTER — Ambulatory Visit: Payer: Medicare HMO | Admitting: Physician Assistant

## 2022-01-28 DIAGNOSIS — I480 Paroxysmal atrial fibrillation: Secondary | ICD-10-CM

## 2022-01-28 DIAGNOSIS — E785 Hyperlipidemia, unspecified: Secondary | ICD-10-CM

## 2022-01-28 DIAGNOSIS — I10 Essential (primary) hypertension: Secondary | ICD-10-CM

## 2022-01-28 DIAGNOSIS — I4729 Other ventricular tachycardia: Secondary | ICD-10-CM

## 2022-01-28 IMAGING — CT CT HEART MORPH/PULM VEIN W/ CM & W/O CA SCORE
1 series · 3 of 3 positions shown, 4 images · IV contrast (isovue)
Comparison: None
COMPARISON: None.
COMPARISON: None

Addendum:
CLINICAL DATA: Pre Ablation

EXAM:
Cardiac Gated CTA
TECHNIQUE: The patient was scanned on a Siem[employer] Force [REDACTED]ice scanner. Gantry
rotation speed was 250 msec with a temporal resolution of 66 msec. A
prospective scan was triggered in the ascending thoracic aorta at
140 HU's Data sets were reconstructed with full mA between 35% and
75% of the R-R interval Images were reviewed using VRT, MIP and MPR
modes. Double oblique images were used to measure the PV diameter
and areas. The patient received 80 cc of contrast at 5 cc/sec
CONTRAST:  Isovue 370 total 80 cc

[Series 2841: laa · 0.43mm/px · 3 of 3 slices shown, 4 images]
[im 1/3  vessel]
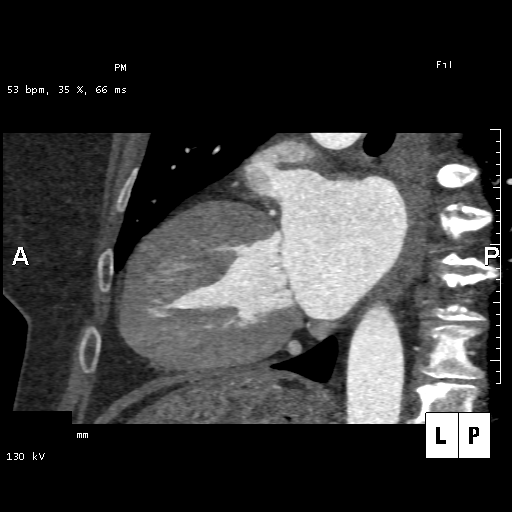
[im 1/3  lung]
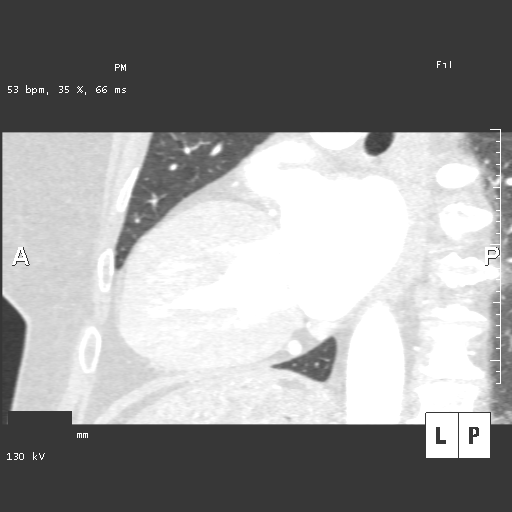
[im 2/3  vessel]
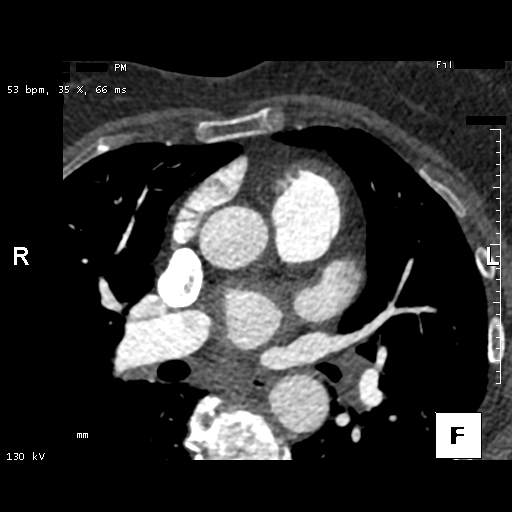
[im 3/3  vessel]
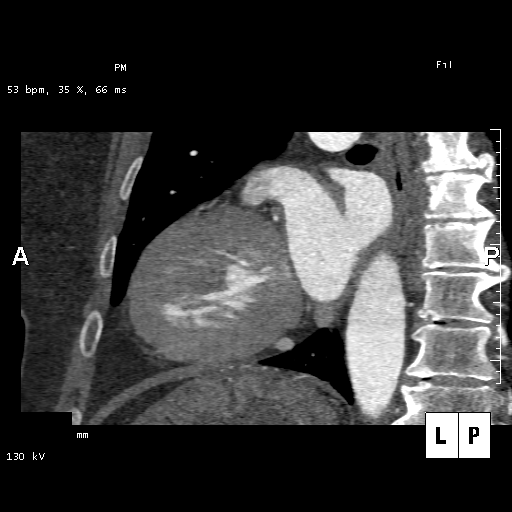

[3 of 3 positions shown; findings below may reference images not displayed]

FINDINGS: Moderate bi atrial enlargement. Chicken Glynis Michalski with no thrombus.
No ASD/PFO. No pericardial effusion Normal ascending aortic root
cm Normal PV

Anatomy including right middle PV.  See measurements below

LUPV:  Ostium 11.2 mm   area 1.0 cm2

LLPV:   Ostium 16 mm  area 1.54 cm2 Elliptical

RUPV:  Ostium 15.3 mm  area 1.4 cm2

RLPV:  Ostium 15.5 mm  area 2.1 cm2

RMPV: Ostium 7.5 mm area 0.5 cm2

Calcium score: No calcium noted in coronary arteries
IMPRESSION: 1.  Moderate Bi atrial enlargement

2.  Chicken Andrei Daniel Javier with no thrombus

3.  Normal PV anatomy with right middle PV see measurements above

4.  Normal ascending aortic root 2.8 cm

5.  No pericardial effusion

6.  Calcium score 0

EXAM:
OVER-READ INTERPRETATION  CT CHEST

The following report is an over-read performed by radiologist Dr.
over-read does not include interpretation of cardiac or coronary
anatomy or pathology. The cardiac CTA interpretation by the
cardiologist is attached.
FINDINGS: No significant noncardiac vascular findings. Visualized mediastinum
and hilar regions demonstrate no lymphadenopathy or masses.
Visualized lungs show no evidence of pulmonary edema, consolidation,
pneumothorax, nodule or pleural fluid. Visualized upper abdomen and
bony structures are unremarkable.
IMPRESSION: No significant incidental findings.

*** End of Addendum ***
FINDINGS: Moderate bi atrial enlargement. Chicken Glynis Michalski with no thrombus.
No ASD/PFO. No pericardial effusion Normal ascending aortic root
cm Normal PV

Anatomy including right middle PV.  See measurements below

LUPV:  Ostium 11.2 mm   area 1.0 cm2

LLPV:   Ostium 16 mm  area 1.54 cm2 Elliptical

RUPV:  Ostium 15.3 mm  area 1.4 cm2

RLPV:  Ostium 15.5 mm  area 2.1 cm2

RMPV: Ostium 7.5 mm area 0.5 cm2

Calcium score: No calcium noted in coronary arteries
IMPRESSION: 1.  Moderate Bi atrial enlargement

2.  Chicken Andrei Daniel Javier with no thrombus

3.  Normal PV anatomy with right middle PV see measurements above

4.  Normal ascending aortic root 2.8 cm

5.  No pericardial effusion

6.  Calcium score 0

## 2022-02-05 ENCOUNTER — Ambulatory Visit: Payer: Medicare HMO | Attending: Pain Medicine | Admitting: Pain Medicine

## 2022-02-05 ENCOUNTER — Encounter: Payer: Self-pay | Admitting: Pain Medicine

## 2022-02-05 VITALS — BP 200/94 | HR 68 | Temp 97.2°F | Resp 18 | Ht 63.0 in | Wt 175.0 lb

## 2022-02-05 DIAGNOSIS — G8929 Other chronic pain: Secondary | ICD-10-CM

## 2022-02-05 DIAGNOSIS — Z79891 Long term (current) use of opiate analgesic: Secondary | ICD-10-CM | POA: Diagnosis not present

## 2022-02-05 DIAGNOSIS — M961 Postlaminectomy syndrome, not elsewhere classified: Secondary | ICD-10-CM

## 2022-02-05 DIAGNOSIS — Z9682 Presence of neurostimulator: Secondary | ICD-10-CM | POA: Insufficient documentation

## 2022-02-05 DIAGNOSIS — M79605 Pain in left leg: Secondary | ICD-10-CM | POA: Insufficient documentation

## 2022-02-05 DIAGNOSIS — G894 Chronic pain syndrome: Secondary | ICD-10-CM | POA: Diagnosis not present

## 2022-02-05 DIAGNOSIS — Z978 Presence of other specified devices: Secondary | ICD-10-CM

## 2022-02-05 DIAGNOSIS — M5417 Radiculopathy, lumbosacral region: Secondary | ICD-10-CM | POA: Diagnosis present

## 2022-02-05 DIAGNOSIS — M79604 Pain in right leg: Secondary | ICD-10-CM | POA: Diagnosis not present

## 2022-02-05 DIAGNOSIS — M5136 Other intervertebral disc degeneration, lumbar region: Secondary | ICD-10-CM | POA: Diagnosis not present

## 2022-02-05 DIAGNOSIS — M47816 Spondylosis without myelopathy or radiculopathy, lumbar region: Secondary | ICD-10-CM

## 2022-02-05 DIAGNOSIS — Z451 Encounter for adjustment and management of infusion pump: Secondary | ICD-10-CM

## 2022-02-05 DIAGNOSIS — Z79899 Other long term (current) drug therapy: Secondary | ICD-10-CM

## 2022-02-05 MED ORDER — MORPHINE SULFATE ER 30 MG PO TBCR: 30.0000 mg | EXTENDED_RELEASE_TABLET | Freq: Two times a day (BID) | ORAL | 0 refills | Status: DC

## 2022-02-05 NOTE — Patient Instructions (Addendum)
___Narcan at home and family is aware of how to use it. _________________________________________________________________________________________  Medication Rules  Purpose: To inform patients, and their family members, of our rules and regulations.  Applies to: All patients receiving prescriptions (written or electronic).  Pharmacy of record: Pharmacy where electronic prescriptions will be sent. If written prescriptions are taken to a different pharmacy, please inform the nursing staff. The pharmacy listed in the electronic medical record should be the one where you would like electronic prescriptions to be sent.  Electronic prescriptions: In compliance with the [place] (STOP) Act of 2017 (Session Lanny Cramp [phone]), effective August 31, 2018, all controlled substances must be electronically prescribed. Calling prescriptions to the pharmacy will cease to exist.  Prescription refills: Only during scheduled appointments. Applies to all prescriptions.  NOTE: The following applies primarily to controlled substances (Opioid* Pain Medications).   Type of encounter (visit): For patients receiving controlled substances, face-to-face visits are required. (Not an option or up to the patient.)  Patient's responsibilities: Pain Pills: Bring all pain pills to every appointment (except for procedure appointments). Pill Bottles: Bring pills in original pharmacy bottle. Always bring the newest bottle. Bring bottle, even if empty. Medication refills: You are responsible for knowing and keeping track of what medications you take and those you need refilled. The day before your appointment: write a list of all prescriptions that need to be refilled. The day of the appointment: give the list to the admitting nurse. Prescriptions will be written only during appointments. No prescriptions will be written on procedure days. If you forget a medication: it will not be  "Called in", "Faxed", or "electronically sent". You will need to get another appointment to get these prescribed. No early refills. Do not call asking to have your prescription filled early. Prescription Accuracy: You are responsible for carefully inspecting your prescriptions before leaving our office. Have the discharge nurse carefully go over each prescription with you, before taking them home. Make sure that your name is accurately spelled, that your address is correct. Check the name and dose of your medication to make sure it is accurate. Check the number of pills, and the written instructions to make sure they are clear and accurate. Make sure that you are given enough medication to last until your next medication refill appointment. Taking Medication: Take medication as prescribed. When it comes to controlled substances, taking less pills or less frequently than prescribed is permitted and encouraged. Never take more pills than instructed. Never take medication more frequently than prescribed.  Inform other Doctors: Always inform, all of your healthcare providers, of all the medications you take. Pain Medication from other Providers: You are not allowed to accept any additional pain medication from any other Doctor or Healthcare provider. There are two exceptions to this rule. (see below) In the event that you require additional pain medication, you are responsible for notifying us, as stated below. Cough Medicine: Often these contain an opioid, such as codeine or hydrocodone. Never accept or take cough medicine containing these opioids if you are already taking an opioid* medication. The combination may cause respiratory failure and death. Medication Agreement: You are responsible for carefully reading and following our Medication Agreement. This must be signed before receiving any prescriptions from our practice. Safely store a copy of your signed Agreement. Violations to the Agreement will result  in no further prescriptions. (Additional copies of our Medication Agreement are available upon request.) Laws, Rules, & Regulations: All patients are expected to  follow all Federal and [employer], Rules, & Regulations. Ignorance of the Laws does not constitute a valid excuse.  Illegal drugs and Controlled Substances: The use of illegal substances (including, but not limited to marijuana and its derivatives) and/or the illegal use of any controlled substances is strictly prohibited. Violation of this rule may result in the immediate and permanent discontinuation of any and all prescriptions being written by our practice. The use of any illegal substances is prohibited. Adopted CDC guidelines & recommendations: Target dosing levels will be at or below 60 MME/day. Use of benzodiazepines** is not recommended.  Exceptions: There are only two exceptions to the rule of not receiving pain medications from other Healthcare Providers. Exception #1 (Emergencies): In the event of an emergency (i.e.: accident requiring emergency care), you are allowed to receive additional pain medication. However, you are responsible for: As soon as you are able, call our office [phone], at any time of the day or night, and leave a message stating your name, the date and nature of the emergency, and the name and dose of the medication prescribed. In the event that your call is answered by a member of our staff, make sure to document and save the date, time, and the name of the person that took your information.  Exception #2 (Planned Surgery): In the event that you are scheduled by another doctor or dentist to have any type of surgery or procedure, you are allowed (for a period no longer than 30 days), to receive additional pain medication, for the acute post-op pain. However, in this case, you are responsible for picking up a copy of our "Post-op Pain Management for Surgeons" handout, and giving it to your surgeon or  dentist. This document is available at our office, and does not require an appointment to obtain it. Simply go to our office during business hours (Monday-Thursday from 8:00 AM to 4:00 PM) (Friday 8:00 AM to 12:00 Noon) or if you have a scheduled appointment with [place], prior to your surgery, and ask for it by name. In addition, you are responsible for: calling our office [phone], at any time of the day or night, and leaving a message stating your name, name of your surgeon, type of surgery, and date of procedure or surgery. Failure to comply with your responsibilities may result in termination of therapy involving the controlled substances. Medication Agreement Violation. Following the above rules, including your responsibilities will help you in avoiding a Medication Agreement Violation ("Breaking your Pain Medication Contract").  *Opioid medications include: morphine, codeine, oxycodone, oxymorphone, hydrocodone, hydromorphone, meperidine, tramadol, tapentadol, buprenorphine, fentanyl, methadone. **Benzodiazepine medications include: diazepam (Valium), alprazolam (Xanax), clonazepam (Klonopine), lorazepam (Ativan), clorazepate (Tranxene), chlordiazepoxide (Librium), estazolam (Prosom), oxazepam (Serax), temazepam (Restoril), triazolam (Halcion) (Last updated: 05/28/2021) ____________________________________________________________________________________________  ____________________________________________________________________________________________  Medication Recommendations and Reminders  Applies to: All patients receiving prescriptions (written and/or electronic).  Medication Rules & Regulations: These rules and regulations exist for your safety and that of others. They are not flexible and neither are we. Dismissing or ignoring them will be considered "non-compliance" with medication therapy, resulting in complete and irreversible termination of such therapy. (See document titled  "Medication Rules" for more details.) In all conscience, because of safety reasons, we cannot continue providing a therapy where the patient does not follow instructions.  Pharmacy of record:  Definition: This is the pharmacy where your electronic prescriptions will be sent.  We do not endorse any particular pharmacy, however, we have experienced problems with [facility] not  securing enough medication supply for the community. We do not restrict you in your choice of pharmacy. However, once we write for your prescriptions, we will NOT be re-sending more prescriptions to fix restricted supply problems created by your pharmacy, or your insurance.  The pharmacy listed in the electronic medical record should be the one where you want electronic prescriptions to be sent. If you choose to change pharmacy, simply notify our nursing staff.  Recommendations: Keep all of your pain medications in a safe place, under lock and key, even if you live alone. We will NOT replace lost, stolen, or damaged medication. After you fill your prescription, take 1 week's worth of pills and put them away in a safe place. You should keep a separate, properly labeled bottle for this purpose. The remainder should be kept in the original bottle. Use this as your primary supply, until it runs out. Once it's gone, then you know that you have 1 week's worth of medicine, and it is time to come in for a prescription refill. If you do this correctly, it is unlikely that you will ever run out of medicine. To make sure that the above recommendation works, it is very important that you make sure your medication refill appointments are scheduled at least 1 week before you run out of medicine. To do this in an effective manner, make sure that you do not leave the office without scheduling your next medication management appointment. Always ask the nursing staff to show you in your prescription , when your medication will be running out. Then  arrange for the receptionist to get you a return appointment, at least 7 days before you run out of medicine. Do not wait until you have 1 or 2 pills left, to come in. This is very poor planning and does not take into consideration that we may need to cancel appointments due to bad weather, sickness, or emergencies affecting our staff. DO NOT ACCEPT A "Partial Fill": If for any reason your pharmacy does not have enough pills/tablets to completely fill or refill your prescription, do not allow for a "partial fill". The law allows the pharmacy to complete that prescription within 72 hours, without requiring a new prescription. If they do not fill the rest of your prescription within those 72 hours, you will need a separate prescription to fill the remaining amount, which we will NOT provide. If the reason for the partial fill is your insurance, you will need to talk to the pharmacist about payment alternatives for the remaining tablets, but again, DO NOT ACCEPT A PARTIAL FILL, unless you can trust your pharmacist to obtain the remainder of the pills within 72 hours.  Prescription refills and/or changes in medication(s):  Prescription refills, and/or changes in dose or medication, will be conducted only during scheduled medication management appointments. (Applies to both, written and electronic prescriptions.) No refills on procedure days. No medication will be changed or started on procedure days. No changes, adjustments, and/or refills will be conducted on a procedure day. Doing so will interfere with the diagnostic portion of the procedure. No phone refills. No medications will be "called into the pharmacy". No Fax refills. No weekend refills. No Holliday refills. No after hours refills.  Remember:  Business hours are:  Monday to Thursday 8:00 AM to 4:00 PM Provider's Schedule: Milinda Pointer, MD - Appointments are:  Medication management: Monday and Wednesday 8:00 AM to 4:00 PM Procedure day:  Tuesday and Thursday 7:30 AM to 4:00 PM Bilal  Holley Raring, MD - Appointments are:  Medication management: Tuesday and Thursday 8:00 AM to 4:00 PM Procedure day: Monday and Wednesday 7:30 AM to 4:00 PM (Last update: 03/20/2020) ____________________________________________________________________________________________  ____________________________________________________________________________________________  CBD (cannabidiol) & Delta-8 (Delta-8 tetrahydrocannabinol) WARNING  Intro: Cannabidiol (CBD) and tetrahydrocannabinol (THC), are two natural compounds found in plants of the Cannabis genus. They can both be extracted from hemp or cannabis. Hemp and cannabis come from the Cannabis sativa plant. Both compounds interact with your body's endocannabinoid system, but they have very different effects. CBD does not produce the high sensation associated with cannabis. Delta-8 tetrahydrocannabinol, also known as delta-8 THC, is a psychoactive substance found in the Cannabis sativa plant, of which marijuana and hemp are two varieties. THC is responsible for the high associated with the illicit use of marijuana.  Applicable to: All individuals currently taking or considering taking CBD (cannabidiol) and, more important, all patients taking opioid analgesic controlled substances (pain medication). (Example: oxycodone; oxymorphone; hydrocodone; hydromorphone; morphine; methadone; tramadol; tapentadol; fentanyl; buprenorphine; butorphanol; dextromethorphan; meperidine; codeine; etc.)  Legal status: CBD remains a Schedule I drug prohibited for any use. CBD is illegal with one exception. In the [place], CBD has a limited Transport planner (FDA) approval for the treatment of two specific types of epilepsy disorders. Only one CBD product has been approved by the FDA for this purpose: "Epidiolex". FDA is aware that some companies are marketing products containing cannabis and cannabis-derived compounds  in ways that violate the [employer], Drug and Cosmetic Act [facility] Act) and that may put the health and safety of consumers at risk. The FDA, a Federal agency, has not enforced the CBD status since 2018. UPDATE: (10/17/2021) The Drug Enforcement Agency ([place]) issued a letter stating that "delta" cannabinoids, including Delta-8-THCO and Delta-9-THCO, synthetically derived from hemp do not qualify as hemp and will be viewed as Schedule I drugs. (Schedule I drugs, substances, or chemicals are defined as drugs with no currently accepted medical use and a high potential for abuse. Some examples of Schedule I drugs are: heroin, lysergic acid diethylamide (LSD), marijuana (cannabis), 3,4-methylenedioxymethamphetamine (ecstasy), methaqualone, and peyote.) (https://jennings.com/)  Legality: Some manufacturers ship CBD products nationally, which is illegal. Often such products are sold online and are therefore available throughout the country. CBD is openly sold in head shops and health food stores in some states where such sales have not been explicitly legalized. Selling unapproved products with unsubstantiated therapeutic claims is not only a violation of the law, but also can put patients at risk, as these products have not been proven to be safe or effective. Federal illegality makes it difficult to conduct research on CBD.  Reference: "FDA Regulation of Cannabis and Cannabis-Derived Products, Including Cannabidiol (CBD)" - [employer].pt  Warning: CBD is not FDA approved and has not undergo the same manufacturing controls as prescription drugs.  This means that the purity and safety of available CBD may be questionable. Most of the time, despite manufacturer's claims, it is contaminated with THC (delta-9-tetrahydrocannabinol - the chemical in marijuana responsible for the "HIGH").  When this is the  case, the [facility] contaminant will trigger a positive urine drug screen (UDS) test for Marijuana (carboxy-THC). Because a positive UDS for any illicit substance is a violation of our medication agreement, your opioid analgesics (pain medicine) may be permanently discontinued. The FDA recently put out a warning about 5 things that everyone should be aware of regarding Delta-8 THC: Delta-8 THC products have not been evaluated or approved by the FDA  for safe use and may be marketed in ways that put the public health at risk. The FDA has received adverse event reports involving delta-8 THC-containing products. Delta-8 THC has psychoactive and intoxicating effects. Delta-8 THC manufacturing often involve use of potentially harmful chemicals to create the concentrations of delta-8 THC claimed in the marketplace. The final delta-8 THC product may have potentially harmful by-products (contaminants) due to the chemicals used in the process. Manufacturing of delta-8 THC products may occur in uncontrolled or unsanitary settings, which may lead to the presence of unsafe contaminants or other potentially harmful substances. Delta-8 THC products should be kept out of the reach of children and pets.  MORE ABOUT CBD  General Information: CBD was discovered in [birth year] and it is a derivative of the cannabis sativa genus plants (Marijuana and Hemp). It is one of the 113 identified substances found in Marijuana. It accounts for up to 40% of the plant's extract. As of 2018, preliminary clinical studies on CBD included research for the treatment of anxiety, movement disorders, and pain. CBD is available and consumed in multiple forms, including inhalation of smoke or vapor, as an aerosol spray, and by mouth. It may be supplied as an oil containing CBD, capsules, dried cannabis, or as a liquid solution. CBD is thought not to be as psychoactive as THC (delta-9-tetrahydrocannabinol - the chemical in marijuana responsible for the "HIGH").  Studies suggest that CBD may interact with different biological target receptors in the body, including cannabinoid and other neurotransmitter receptors. As of 2018 the mechanism of action for its biological effects has not been determined.  Side-effects  Adverse reactions: Dry mouth, diarrhea, decreased appetite, fatigue, drowsiness, malaise, weakness, sleep disturbances, and others.  Drug interactions: CBC may interact with other medications such as blood-thinners. Because CBD causes drowsiness on its own, it also increases the drowsiness caused by other medications, including antihistamines (such as Benadryl), benzodiazepines (Xanax, Ativan, Valium), antipsychotics, antidepressants and opioids, as well as alcohol and supplements such as kava, melatonin and St. John's Wort. Be cautious with the following combinations:   Brivaracetam (Briviact) Brivaracetam is changed and broken down by the body. CBD might decrease how quickly the body breaks down brivaracetam. This might increase levels of brivaracetam in the body.  Caffeine Caffeine is changed and broken down by the body. CBD might decrease how quickly the body breaks down caffeine. This might increase levels of caffeine in the body.  Carbamazepine (Tegretol) Carbamazepine is changed and broken down by the body. CBD might decrease how quickly the body breaks down carbamazepine. This might increase levels of carbamazepine in the body and increase its side effects.  Citalopram (Celexa) Citalopram is changed and broken down by the body. CBD might decrease how quickly the body breaks down citalopram. This might increase levels of citalopram in the body and increase its side effects.  Clobazam (Onfi) Clobazam is changed and broken down by the liver. CBD might decrease how quickly the liver breaks down clobazam. This might increase the effects and side effects of clobazam.  Eslicarbazepine (Aptiom) Eslicarbazepine is changed and broken down by  the body. CBD might decrease how quickly the body breaks down eslicarbazepine. This might increase levels of eslicarbazepine in the body by a small amount.  Everolimus (Zostress) Everolimus is changed and broken down by the body. CBD might decrease how quickly the body breaks down everolimus. This might increase levels of everolimus in the body.  Lithium Taking higher doses of CBD might increase levels of lithium. This can  increase the risk of lithium toxicity.  Medications changed by the liver (Cytochrome P450 1A1 (CYP1A1) substrates) Some medications are changed and broken down by the liver. CBD might change how quickly the liver breaks down these medications. This could change the effects and side effects of these medications.  Medications changed by the liver (Cytochrome P450 1A2 (CYP1A2) substrates) Some medications are changed and broken down by the liver. CBD might change how quickly the liver breaks down these medications. This could change the effects and side effects of these medications.  Medications changed by the liver (Cytochrome P450 1B1 (CYP1B1) substrates) Some medications are changed and broken down by the liver. CBD might change how quickly the liver breaks down these medications. This could change the effects and side effects of these medications.  Medications changed by the liver (Cytochrome P450 2A6 (CYP2A6) substrates) Some medications are changed and broken down by the liver. CBD might change how quickly the liver breaks down these medications. This could change the effects and side effects of these medications.  Medications changed by the liver (Cytochrome P450 2B6 (CYP2B6) substrates) Some medications are changed and broken down by the liver. CBD might change how quickly the liver breaks down these medications. This could change the effects and side effects of these medications.  Medications changed by the liver (Cytochrome P450 2C19 (CYP2C19) substrates) Some  medications are changed and broken down by the liver. CBD might change how quickly the liver breaks down these medications. This could change the effects and side effects of these medications.  Medications changed by the liver (Cytochrome P450 2C8 (CYP2C8) substrates) Some medications are changed and broken down by the liver. CBD might change how quickly the liver breaks down these medications. This could change the effects and side effects of these medications.  Medications changed by the liver (Cytochrome P450 2C9 (CYP2C9) substrates) Some medications are changed and broken down by the liver. CBD might change how quickly the liver breaks down these medications. This could change the effects and side effects of these medications.  Medications changed by the liver (Cytochrome P450 2D6 (CYP2D6) substrates) Some medications are changed and broken down by the liver. CBD might change how quickly the liver breaks down these medications. This could change the effects and side effects of these medications.  Medications changed by the liver (Cytochrome P450 2E1 (CYP2E1) substrates) Some medications are changed and broken down by the liver. CBD might change how quickly the liver breaks down these medications. This could change the effects and side effects of these medications.  Medications changed by the liver (Cytochrome P450 3A4 (CYP3A4) substrates) Some medications are changed and broken down by the liver. CBD might change how quickly the liver breaks down these medications. This could change the effects and side effects of these medications.  Medications changed by the liver (Glucuronidated drugs) Some medications are changed and broken down by the liver. CBD might change how quickly the liver breaks down these medications. This could change the effects and side effects of these medications.  Medications that decrease the breakdown of other medications by the liver (Cytochrome P450 2C19 (CYP2C19)  inhibitors) CBD is changed and broken down by the liver. Some drugs decrease how quickly the liver changes and breaks down CBD. This could change the effects and side effects of CBD.  Medications that decrease the breakdown of other medications in the liver (Cytochrome P450 3A4 (CYP3A4) inhibitors) CBD is changed and broken down by the liver. Some drugs decrease how quickly  the liver changes and breaks down CBD. This could change the effects and side effects of CBD.  Medications that increase breakdown of other medications by the liver (Cytochrome P450 3A4 (CYP3A4) inducers) CBD is changed and broken down by the liver. Some drugs increase how quickly the liver changes and breaks down CBD. This could change the effects and side effects of CBD.  Medications that increase the breakdown of other medications by the liver (Cytochrome P450 2C19 (CYP2C19) inducers) CBD is changed and broken down by the liver. Some drugs increase how quickly the liver changes and breaks down CBD. This could change the effects and side effects of CBD.  Methadone (Dolophine) Methadone is broken down by the liver. CBD might decrease how quickly the liver breaks down methadone. Taking cannabidiol along with methadone might increase the effects and side effects of methadone.  Rufinamide (Banzel) Rufinamide is changed and broken down by the body. CBD might decrease how quickly the body breaks down rufinamide. This might increase levels of rufinamide in the body by a small amount.  Sedative medications (CNS depressants) CBD might cause sleepiness and slowed breathing. Some medications, called sedatives, can also cause sleepiness and slowed breathing. Taking CBD with sedative medications might cause breathing problems and/or too much sleepiness.  Sirolimus (Rapamune) Sirolimus is changed and broken down by the body. CBD might decrease how quickly the body breaks down sirolimus. This might increase levels of sirolimus in the  body.  Stiripentol (Diacomit) Stiripentol is changed and broken down by the body. CBD might decrease how quickly the body breaks down stiripentol. This might increase levels of stiripentol in the body and increase its side effects.  Tacrolimus (Prograf) Tacrolimus is changed and broken down by the body. CBD might decrease how quickly the body breaks down tacrolimus. This might increase levels of tacrolimus in the body.  Tamoxifen (Soltamox) Tamoxifen is changed and broken down by the body. CBD might affect how quickly the body breaks down tamoxifen. This might affect levels of tamoxifen in the body.  Topiramate (Topamax) Topiramate is changed and broken down by the body. CBD might decrease how quickly the body breaks down topiramate. This might increase levels of topiramate in the body by a small amount.  Valproate Valproic acid can cause liver injury. Taking cannabidiol with valproic acid might increase the chance of liver injury. CBD and/or valproic acid might need to be stopped, or the dose might need to be reduced.  Warfarin (Coumadin) CBD might increase levels of warfarin, which can increase the risk for bleeding. CBD and/or warfarin might need to be stopped, or the dose might need to be reduced.  Zonisamide Zonisamide is changed and broken down by the body. CBD might decrease how quickly the body breaks down zonisamide. This might increase levels of zonisamide in the body by a small amount. (Last update: 10/29/2021) ____________________________________________________________________________________________  ____________________________________________________________________________________________  Drug Holidays (Slow)  What is a "Drug Holiday"? Drug Holiday: is the name given to the period of time during which a patient stops taking a medication(s) for the purpose of eliminating tolerance to the drug.  Benefits Improved effectiveness of opioids. Decreased opioid dose needed  to achieve benefits. Improved pain with lesser dose.  What is tolerance? Tolerance: is the progressive decreased in effectiveness of a drug due to its repetitive use. With repetitive use, the body gets use to the medication and as a consequence, it loses its effectiveness. This is a common problem seen with opioid pain medications. As a result, a larger  dose of the drug is needed to achieve the same effect that used to be obtained with a smaller dose.  How long should a "Drug Holiday" last? You should stay off of the pain medicine for at least 14 consecutive days. (2 weeks)  Should I stop the medicine "cold [place]"? No. You should always coordinate with your Pain Specialist so that he/she can provide you with the correct medication dose to make the transition as smoothly as possible.  How do I stop the medicine? Slowly. You will be instructed to decrease the daily amount of pills that you take by one (1) pill every seven (7) days. This is called a "slow downward taper" of your dose. For example: if you normally take four (4) pills per day, you will be asked to drop this dose to three (3) pills per day for seven (7) days, then to two (2) pills per day for seven (7) days, then to one (1) per day for seven (7) days, and at the end of those last seven (7) days, this is when the "Drug Holiday" would start.   Will I have withdrawals? By doing a "slow downward taper" like this one, it is unlikely that you will experience any significant withdrawal symptoms. Typically, what triggers withdrawals is the sudden stop of a high dose opioid therapy. Withdrawals can usually be avoided by slowly decreasing the dose over a prolonged period of time. If you do not follow these instructions and decide to stop your medication abruptly, withdrawals may be possible.  What are withdrawals? Withdrawals: refers to the wide range of symptoms that occur after stopping or dramatically reducing opiate drugs after heavy and  prolonged use. Withdrawal symptoms do not occur to patients that use low dose opioids, or those who take the medication sporadically. Contrary to benzodiazepine (example: Valium, Xanax, etc.) or alcohol withdrawals ("Delirium Tremens"), opioid withdrawals are not lethal. Withdrawals are the physical manifestation of the body getting rid of the excess receptors.  Expected Symptoms Early symptoms of withdrawal may include: Agitation Anxiety Muscle aches Increased tearing Insomnia Runny nose Sweating Yawning  Late symptoms of withdrawal may include: Abdominal cramping Diarrhea Dilated pupils Goose bumps Nausea Vomiting  Will I experience withdrawals? Due to the slow nature of the taper, it is very unlikely that you will experience any.  What is a slow taper? Taper: refers to the gradual decrease in dose.  (Last update: 03/20/2020) ____________________________________________________________________________________________   Opioid Overdose Opioids are drugs that are often used to treat pain. Opioids include illegal drugs, such as heroin, as well as prescription pain medicines, such as codeine, morphine, hydrocodone, and fentanyl. An opioid overdose happens when you take too much of an opioid. An overdose may be intentional or accidental and can happen with any type of opioid. The effects of an overdose can be mild, dangerous, or even deadly. Opioid overdose is a medical emergency. What are the causes? This condition may be caused by: Taking too much of an opioid on purpose. Taking too much of an opioid by accident. Using two or more substances that contain opioids at the same time. Taking an opioid with a substance that affects your heart, breathing, or blood pressure. These include alcohol, tranquilizers, sleeping pills, illegal drugs, and some over-the-counter medicines. This condition may also happen due to an error made by: A health care provider who prescribes a  medicine. The pharmacist who fills the prescription. What increases the risk? This condition is more likely in: Children. They may be attracted  to colorful pills. Because of a child's small size, even a small amount of a medicine can be dangerous. Older people. They may be taking many different medicines. Older people may have difficulty reading labels or remembering when they last took their medicines. They may also be more sensitive to the effects of opioids. People with chronic medical conditions, especially heart, liver, kidney, or neurological diseases. People who take an opioid for a long period of time. People who take opioids and use illegal drugs, such as heroin, or other substances, such as alcohol. People who: Have a history of drug or alcohol abuse. Have certain mental health conditions. Have a history of previous drug overdoses. People who take opioids that are not prescribed for them. What are the signs or symptoms? Symptoms of this condition depend on the type of opioid and the amount that was taken. Common symptoms include: Sleepiness or difficulty waking from sleep. Confusion. Slurred speech. Slowed breathing and a slow pulse (bradycardia). Nausea and vomiting. Abnormally small pupils. Signs and symptoms that require emergency treatment include: Cold, clammy, and pale skin. Blue lips and fingernails. Vomiting. Gurgling sounds in the throat. A pulse that is very slow or difficult to detect. Breathing that is very irregular, slow, noisy, or difficult to detect. Inability to respond to speech or be awakened from sleep (stupor). Seizures. How is this diagnosed? This condition is diagnosed based on your symptoms and medical history. It is important to tell your health care provider: About all of the opioids that you took. When you took the opioids. Whether you were drinking alcohol or using marijuana, cocaine, or other drugs. Your health care provider will do a  physical exam. This exam may include: Checking and monitoring your heart rate and rhythm, breathing rate, temperature, and blood pressure. Measuring oxygen levels in your blood. Checking for abnormally small pupils. You may also have blood tests or urine tests. You may have X-rays if you are having severe breathing problems. How is this treated? This condition requires immediate medical treatment and hospitalization. Reversing the effects of the opioid is the first step in treatment. If you have a Narcan kit or naloxone, use it right away. Follow your health care provider's instructions. A friend or family member can also help you with this. The rest of your treatment will be given in the hospital intensive care (ICU). Treatment in the hospital may include: Giving salts and minerals (electrolytes) along with fluids through an IV. Inserting a breathing tube (endotracheal tube) in your airway to help you breathe if you cannot breathe on your own or you are in danger of not being able to breathe on your own. Giving oxygen through a small tube under your nose. Passing a tube through your nose and into your stomach (nasogastric tube, or NG tube) to empty your stomach. Giving medicines that: Increase your blood pressure. Relieve nausea and vomiting. Relieve abdominal pain and cramping. Reverse the effects of the opioid (naloxone). Monitoring your heart and oxygen levels. Ongoing counseling and mental health support if you intentionally overdosed or used an illegal drug. Follow these instructions at home:  Medicines Take over-the-counter and prescription medicines only as told by your health care provider. Always ask your health care provider about possible side effects and interactions of any new medicine that you start taking. Keep a list of all the medicines that you take, including over-the-counter medicines. Bring this list with you to all your medical visits. General instructions Drink  enough fluid to keep your urine  pale yellow. Keep all follow-up visits. This is important. How is this prevented? Read the drug inserts that come with your opioid pain medicines. Take medicines only as told by your health care provider. Do not take more medicine than you are told. Do not take medicines more frequently than you are told. Do not drink alcohol or take sedatives when taking opioids. Do not use illegal or recreational drugs, including cocaine, ecstasy, and marijuana. Do not take opioid medicines that are not prescribed for you. Store all medicines in safety containers that are out of the reach of children. Get help if you are struggling with: Alcohol or drug use. Depression or another mental health problem. Thoughts of hurting yourself or another person. Keep the phone number of your local poison control center near your phone or in your mobile phone. In the [place] the hotline of the [facility] is 479-207-5223. If you were prescribed naloxone, make sure you understand how to take it. Contact a health care provider if: You need help understanding how to take your pain medicines. You feel your medicines are too strong. You are concerned that your pain medicines are not working well for your pain. You develop new symptoms or side effects when you are taking medicines. Get help right away if: You or someone else is having symptoms of an opioid overdose. Get help even if you are not sure. You have thoughts about hurting yourself or others. You have: Chest pain. Difficulty breathing. A loss of consciousness. These symptoms may represent a serious problem that is an emergency. Do not wait to see if the symptoms will go away. Get medical help right away. Call your local emergency services (911 in the [place] Do not drive yourself to the hospital. If you ever feel like you may hurt yourself or others, or have thoughts about taking your own life, get help right away.  You can go to your nearest emergency department or: Call your local emergency services (911 in the [place] Call a suicide crisis helpline, such as the [place] at 519-724-0599 or 988 in the [place]. This is open 24 hours a day in the [place] Text the Crisis Text Line at 910-485-4134 (in the [place].). Summary Opioids are drugs that are often used to treat pain. Opioids include illegal drugs, such as heroin, as well as prescription pain medicines. An opioid overdose happens when you take too much of an opioid. Overdoses can be intentional or accidental. Opioid overdose is very dangerous. It is a life-threatening emergency. If you or someone you know is experiencing an opioid overdose, get help right away. This information is not intended to replace advice given to you by your health care provider. Make sure you discuss any questions you have with your health care provider. Document Revised: 03/12/2021 Document Reviewed: 11/27/2020 [employer] Patient Education  [employer].

## 2022-02-05 NOTE — Progress Notes (Signed)
PROVIDER NOTE: Interpretation of information contained herein should be left to medically-trained personnel. Specific patient instructions are provided elsewhere under "Patient Instructions" section of medical record. This document was created in part using STT-dictation technology, any transcriptional errors that may result from this process are unintentional.  Patient: Kendra Nelson Type: Established DOB: 22-Dec-1960 MRN: [id number] PCP: Berkley Harvey, NP  Service: Procedure DOS: 02/05/2022 Setting: Ambulatory Location: Ambulatory outpatient facility Delivery: Face-to-face Provider: Gaspar Cola, MD Specialty: Interventional Pain Management Specialty designation: 09 Location: Outpatient facility Ref. Prov.: Berkley Harvey, NP    Primary Reason for Visit: Interventional Pain Management Treatment. CC: Back Pain (back)  Procedure:          Type: Management of Intrathecal Drug Delivery System (IDDS) - Reservoir Refill [id number]). No rate change.  Indications: 1. 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 Chronic pain syndrome   3. Chronic low back pain (1ry area of Pain) (Bilateral) (R>L) w/ sciatica (Bilateral)   4. Chronic lower extremity pain (2ry area of Pain) (Bilateral) (R>L)   5. Chronic lumbosacral radiculopathy (L5) (Left)   6. DDD (degenerative disc disease), lumbar   7. Lumbar facet syndrome (Bilateral) (R>L)   8. Presence of neurostimulator   9. Presence of intrathecal pump   10. Pharmacologic therapy   11. Long term prescription opiate use   12. Chronic use of opiate for therapeutic purpose   13. Encounter for medication management   14. Encounter for chronic pain management   15. Encounter for adjustment and management of infusion pump    Pain Assessment: Self-Reported Pain Score: 4 /10             Reported level is compatible with observation.          Intrathecal Drug Delivery System (IDDS)  Pump Device:  Manufacturer:  Medtronic Model: Synchromed II Model No.: [id number] Serial No.: [id number] Delivery Route: Intrathecal Type: Programmable  Volume (mL): 40 mL reservoir Priming Volume: n/a  Calibration Constant: 120.0  MRI compatibility: Conditional   Implant Details:  Date: 05/09/2019 Implanter: Clydell Hakim, MD  Contact Information: [facility] Last Revision/Replacement: 05/09/2019 Estimated Replacement Date: May/2027  Implant Site: Abdominal Laterality: Left  Catheter: Manufacturer: Medtronic Model:  (two piece) Model No.: [id number]  Serial No.: n/a  Implanted Length (cm): 38.1  Catheter Volume (mL): 0.214  Tip Location (Level): T6-7 Canal Access Site: n/a  Drug content:  Primary Medication Class: Opioid  Medication: PF-Fentanyl  Concentration: 1,000 mcg/mL   Secondary Medication Class: Local Anesthetic  Medication: PF-Bupivacaine  Concentration: 20.0 mg/mL   Tertiary Medication Class: none   PA parameters (PCA-mode):  Mode: Off (Inactive)  Programming:  Type: Simple continuous.  Medication, Concentration, Infusion Program, & Delivery Rate: For up-to-date details please see most recent scanned programming printout.     Changes:  Medication Change: None at this point Rate Change: No change in rate  Reported side-effects or adverse reactions: None reported  Effectiveness: Described as relatively effective, allowing for increase in activities of daily living (ADL) Clinically meaningful improvement in function (CMIF): Sustained CMIF goals met  Plan: Pump refill today   Pharmacotherapy Assessment   Opioid Analgesic: Morphine (MS Contin) 30 mg PO BID (60 mg/day of morphine) (60 MME/day) +  PF-(intrathecal)-Fentanyl (13.4 mcg/hr)  changed from Oxycodone IR 10 mg 4x/day (40 mg/day of oxycodone) (60 MME/day), due to national shortage MME/day: 60 MME/day (oral) + 32.16 MME/day (intrathecal) = 93.16 MME/day (Aprox. 1.16 MME/day/kg).  Monitoring: [place] PMP:  PDMP reviewed during this encounter.       Pharmacotherapy: No side-effects or adverse reactions reported. Compliance: No problems identified. Effectiveness: Clinically acceptable. Plan: Refer to "POC". UDS:  Summary  Date Value Ref Range Status  02/18/2021 Note  Final    Comment:    ==================================================================== ToxASSURE Select 13 (MW) ==================================================================== Test                             Result       Flag       Units  Drug Present and Declared for Prescription Verification   Morphine                       >4000        EXPECTED   ng/mg creat   Normorphine                    405          EXPECTED   ng/mg creat    Potential sources of large amounts of morphine in the absence of    codeine include administration of morphine or use of heroin.     Normorphine is an expected metabolite of morphine.    Hydromorphone                  52           EXPECTED   ng/mg creat    Hydromorphone may be present as a metabolite of morphine;    concentrations of hydromorphone rarely exceed 5% of the morphine    concentration when this is the source of hydromorphone.    Fentanyl                       27           EXPECTED   ng/mg creat   Norfentanyl                    59           EXPECTED   ng/mg creat    Source of fentanyl is a scheduled prescription medication, including    IV, patch, and transmucosal formulations. Norfentanyl is an expected    metabolite of fentanyl.  ==================================================================== Test                      Result    Flag   Units      Ref Range   Creatinine              250              mg/dL      >=20 ==================================================================== Declared Medications:  The flagging and interpretation on this report are based on the  following declared medications.  Unexpected results may arise from  inaccuracies in the declared  medications.   **Note: The testing scope of this panel includes these medications:   Fentanyl  Morphine (MS Contin)   **Note: The testing scope of this panel does not include the  following reported medications:   Apixaban (Eliquis)  Atorvastatin (Lipitor)  Bupivacaine  Lisinopril (Zestril)  Metoprolol (Toprol)  Ropinirole (Requip)  Tizanidine (Zanaflex) ==================================================================== For clinical consultation, please call [phone]. ====================================================================      Pre-op H&P Assessment:  Ms. Appleman is a 62 y.o. (year old), female patient, seen  today for interventional treatment. 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 Ms. Naab has a current medication list which includes the following prescription(s): AMBULATORY NON FORMULARY MEDICATION, atorvastatin, biotin w/ vitamins c & e, calcium citrate-vitamin d, carvedilol, furosemide, lisinopril, naloxone, metoprolol tartrate, [START ON 02/14/2022] morphine, [START ON 03/16/2022] morphine, and [START ON 04/15/2022] morphine. Her primarily concern today is the Back Pain (back)  Initial Vital Signs:  Pulse/HCG Rate: 68  Temp: (!) 97.2 F (36.2 C) Resp: 18 BP: (!) 200/94 SpO2: 98 %  BMI: Estimated body mass index is 31 kg/m as calculated from the following:   Height as of this encounter: '5\' 3"'  (1.6 m).   Weight as of this encounter: 175 lb (79.4 kg).  Risk Assessment: Allergies: Reviewed. She is allergic to benadryl [diphenhydramine hcl], bee venom, and  dexamethasone.  Allergy Precautions: None required Coagulopathies: Reviewed. None identified.  Blood-thinner therapy: None at this time Active Infection(s): Reviewed. None identified. Ms. Mckeever is afebrile  Site Confirmation: Ms. Newson was asked to confirm the procedure and laterality before marking the site Procedure checklist: Completed Consent: Before the procedure and under the influence of no sedative(s), amnesic(s), or anxiolytics, the patient was informed of the treatment options, risks and possible complications. To fulfill our ethical and legal obligations, as recommended by the American Medical Association's Code of Ethics, I have informed the patient of my clinical impression; the nature and purpose of the treatment or procedure; the risks, benefits, and possible complications of the intervention; the alternatives, including doing nothing; the risk(s) and benefit(s) of the alternative treatment(s) or procedure(s); and the risk(s) and benefit(s) of doing nothing.  Ms. Runyan was provided with information about the general risks and possible complications associated with most interventional procedures. These include, but are not limited to: failure to achieve desired goals, infection, bleeding, organ or nerve damage, allergic reactions, paralysis, and/or death.  In addition, she was informed of those risks and possible complications associated to this particular procedure, which include, but are not limited to: damage to the implant; failure to decrease pain; local, systemic, or serious CNS infections, intraspinal abscess with possible cord compression and paralysis, or life-threatening such as meningitis; bleeding; organ damage; nerve injury or damage with subsequent sensory, motor, and/or autonomic system dysfunction, resulting in transient or permanent pain, numbness, and/or weakness of one or several areas of the body; allergic reactions, either minor or major life-threatening,  such as anaphylactic or anaphylactoid reactions.  Furthermore, Ms. Mcmorris was informed of those risks and complications associated with the medications. These include, but are not limited to: allergic reactions (i.e.: anaphylactic or anaphylactoid reactions); endorphine suppression; bradycardia and/or hypotension; water retention and/or peripheral vascular relaxation leading to lower extremity edema and possible stasis ulcers; respiratory depression and/or shortness of breath; decreased metabolic rate leading to weight gain; swelling or edema; medication-induced neural toxicity; particulate matter embolism and blood vessel occlusion with resultant organ, and/or nervous system infarction; and/or intrathecal granuloma formation with possible spinal cord compression and permanent paralysis.  Before refilling the pump Ms. Kinch was informed that some of the medications used in the devise may not be FDA approved for such use and therefore it constitutes an off-label use of the medications.  Finally, she was informed that  Medicine is not an exact science; therefore, there is also the possibility of unforeseen or unpredictable risks and/or possible complications that may result in a catastrophic outcome. The patient indicated having understood very clearly. We have given the patient no guarantees and we have made no promises. Enough time was given to the patient to ask questions, all of which were answered to the patient's satisfaction. Ms. Weightman has indicated that she wanted to continue with the procedure. Attestation: I, the ordering provider, attest that I have discussed with the patient the benefits, risks, side-effects, alternatives, likelihood of achieving goals, and potential problems during recovery for the procedure that I have provided informed consent. Date  Time: 02/05/2022 12:46 PM  Pre-Procedure Preparation:  Monitoring: As per clinic protocol. Respiration, ETCO2, SpO2, BP, heart rate and  rhythm monitor placed and checked for adequate function Safety Precautions: Patient was assessed for positional comfort and pressure points before starting the procedure. Time-out: I initiated and conducted the "Time-out" before starting the procedure, as per protocol. The patient was asked to participate by confirming the accuracy of the "Time Out" information. Verification of the correct person, site, and procedure were performed and confirmed by me, the nursing staff, and the patient. "Time-out" conducted as per Joint Commission's Universal Protocol (UP.01.01.01). Time: 1305  Description of Procedure:          Position: Supine Target Area: Central-port of intrathecal pump. Approach: Anterior, 90 degree angle approach. Area Prepped: Entire Area around the pump implant. DuraPrep (Iodine Povacrylex [0.7% available iodine] and Isopropyl Alcohol, 74% w/w) Safety Precautions: Aspiration looking for blood return was conducted prior to all injections. At no point did we inject any substances, as a needle was being advanced. No attempts were made at seeking any paresthesias. Safe injection practices and needle disposal techniques used. Medications properly checked for expiration dates. SDV (single dose vial) medications used. Description of the Procedure: Protocol guidelines were followed. Two nurses trained to do implant refills were present during the entire procedure. The refill medication was checked by both healthcare providers as well as the patient. The patient was included in the "Time-out" to verify the medication. The patient was placed in position. The pump was identified. The area was prepped in the usual manner. The sterile template was positioned over the pump, making sure the side-port location matched that of the pump. Both, the pump and the template were held for stability. The needle provided in the Medtronic Kit was then introduced thru the center of the template and into the central port.  The pump content was aspirated and discarded volume documented. The new medication was slowly infused into the pump, thru the filter, making sure to avoid overpressure of the device. The needle was then removed and the area cleansed, making sure to leave some of the prepping solution back to take advantage of its long term bactericidal properties. The pump was interrogated and programmed to reflect the correct medication, volume, and dosage. The program was printed and taken to the physician for approval. Once checked and signed by the physician, a copy was provided to the patient and another scanned into the EMR.  Vitals:   02/05/22 1246 02/05/22 1247  BP:  (!) 200/94  Pulse:  68  Resp:  18  Temp: (!) 97.2 F (36.2 C)   SpO2:  98%  Weight: 175 lb (79.4 kg)   Height: '5\' 3"'  (1.6 m)     Start Time: 1305 hrs. End Time: 1310 hrs. Materials & Medications: Medtronic Refill  Kit Medication(s): Please see chart orders for details.  Imaging Guidance:          Type of Imaging Technique: None used Indication(s): N/A Exposure Time: No patient exposure Contrast: None used. Fluoroscopic Guidance: N/A Ultrasound Guidance: N/A Interpretation: N/A  Antibiotic Prophylaxis:   Anti-infectives (From admission, onward)    None      Indication(s): None identified  Post-operative Assessment:  Post-procedure Vital Signs:  Pulse/HCG Rate: 68  Temp: (!) 97.2 F (36.2 C) Resp: 18 BP: (!) 200/94 SpO2: 98 %  EBL: None  Complications: No immediate post-treatment complications observed by team, or reported by patient.  Note: The patient tolerated the entire procedure well. A repeat set of vitals were taken after the procedure and the patient was kept under observation following institutional policy, for this type of procedure. Post-procedural neurological assessment was performed, showing return to baseline, prior to discharge. The patient was provided with post-procedure discharge instructions,  including a section on how to identify potential problems. Should any problems arise concerning this procedure, the patient was given instructions to immediately contact us, at any time, without hesitation. In any case, we plan to contact the patient by telephone for a follow-up status report regarding this interventional procedure.  Comments:  No additional relevant information.  Plan of Care  Orders:  Orders Placed This Encounter  Procedures   PUMP REFILL    Maintain Protocol by having two(2) healthcare providers during procedure and programming.    Scheduling Instructions:     Please refill intrathecal pump today.    Order Specific Question:   Where will this procedure be performed?    Answer:   [facility]   PUMP REFILL    Whenever possible schedule on a procedure today.    Standing Status:   Future    Standing Expiration Date:   06/07/2022    Scheduling Instructions:     Please schedule intrathecal pump refill based on pump programming. Avoid schedule intervals of more than 120 days (4 months).    Order Specific Question:   Where will this procedure be performed?    Answer:   [facility]   ToxASSURE Select 13 (MW), Urine    Volume: 30 ml(s). Minimum 3 ml of urine is needed. Document temperature of fresh sample. Indications: Long term (current) use of opiate analgesic [phone])    Order Specific Question:   Release to patient    Answer:   Immediate   Informed Consent Details: Physician/Practitioner Attestation; Transcribe to consent form and obtain patient signature    Transcribe to consent form and obtain patient signature.    Order Specific Question:   Physician/Practitioner attestation of informed consent for procedure/surgical case    Answer:   I, the physician/practitioner, attest that I have discussed with the patient the benefits, risks, side effects, alternatives, likelihood of achieving goals and potential problems during recovery for the procedure that I  have provided informed consent.    Order Specific Question:   Procedure    Answer:   Intrathecal pump refill    Order Specific Question:   Physician/Practitioner performing the procedure    Answer:   Attending Physician: Kathlen Brunswick. Dossie Arbour, MD & designated trained staff    Order Specific Question:   Indication/Reason    Answer:   Chronic Pain Syndrome (G89.4), presence of an intrathecal pump (Z97.8)   Chronic Opioid Analgesic:  Morphine (MS Contin) 30 mg PO BID (60 mg/day of morphine) (60 MME/day) +  PF-(intrathecal)-Fentanyl (13.4 mcg/hr)  changed from Oxycodone IR 10 mg 4x/day (40 mg/day of oxycodone) (60 MME/day), due to national shortage MME/day: 60 MME/day (oral) + 32.16 MME/day (intrathecal) = 93.16 MME/day (Aprox. 1.16 MME/day/kg).   Medications ordered for procedure: Meds ordered this encounter  Medications   morphine (MS CONTIN) 30 MG 12 hr tablet    Sig: Take 1 tablet (30 mg total) by mouth every 12 (twelve) hours. Must last 30 days. Do not break tablet    Dispense:  60 tablet    Refill:  0    DO NOT: delete (not duplicate); no partial-fill (will deny script to complete), no refill request (F/U required). DISPENSE: 1 day early if closed on fill date. WARN: No CNS-depressants within 8 hrs of med.   morphine (MS CONTIN) 30 MG 12 hr tablet    Sig: Take 1 tablet (30 mg total) by mouth every 12 (twelve) hours. Must last 30 days. Do not break tablet    Dispense:  60 tablet    Refill:  0    DO NOT: delete (not duplicate); no partial-fill (will deny script to complete), no refill request (F/U required). DISPENSE: 1 day early if closed on fill date. WARN: No CNS-depressants within 8 hrs of med.   morphine (MS CONTIN) 30 MG 12 hr tablet    Sig: Take 1 tablet (30 mg total) by mouth every 12 (twelve) hours. Must last 30 days. Do not break tablet    Dispense:  60 tablet    Refill:  0    DO NOT: delete (not duplicate); no partial-fill (will deny script to complete), no refill request (F/U  required). DISPENSE: 1 day early if closed on fill date. WARN: No CNS-depressants within 8 hrs of med.   Medications administered: Hassan Rowan A. Lefevre had no medications administered during this visit.  See the medical record for exact dosing, route, and time of administration.  Follow-up plan:   Return for Pump Refill (Max:80mo.       Interventional Therapies  Risk  Complexity Considerations:   Estimated body mass index is 31 kg/m as calculated from the following:   Height as of this encounter: '5\' 3"'  (1.6 m).   Weight as of this encounter: 175 lb (79.4 kg). ELIQUIS Anticoagulation (Stop: 3 days  Restart: 6 hours) Severe anxiety and agoraphobia  Poor candidate for RFA secondary to Lumbar hardware   Planned  Pending:   Intrathecal pump refill as per pump programming.   Under consideration:   Diagnostic left IA knee joint inj  Diagnostic bilateral L2 TFESI  Diagnostic left genicular NB  Possible left genicular nerve RFA  Diagnostic caudal ESI + epidurogram  Possible Racz procedure    Completed:   Palliative intrathecal pump refills and adjustments  [facility] (05/10[facility]2012[facility]  [facility]athe[facility] (05/09/2019)  Spinal cord stimulator [facility] (06/09/2016)    Therapeutic  Palliative (PRN) options:   Therapeutic/palliative intrathecal pump  management (analysis and refill)     Recent Visits No visits were found meeting these conditions. Showing recent visits within past 90 days and meeting all other requirements Today's Visits Date Type Provider Dept  02/05/22 Procedure visit NMilinda Pointer MD [facility]  Showing today's visits and meeting all other requirements Future Appointments No visits were found meeting these conditions. Showing future appointments within next 90 days and meeting all other  requirements  Disposition: Discharge home  Discharge (Date  Time): 02/05/2022; 1313 hrs.   Primary Care Physician: Berkley Harvey, NP Location: [facility] Note by: Gaspar Cola, MD Date: 02/05/2022; Time: 1:55 PM  Disclaimer:  Medicine is not an Chief Strategy Officer. The only guarantee in medicine is that nothing is guaranteed. It is important to note that the decision to proceed with this intervention was based on the information collected from the patient. The Data and conclusions were drawn from the patient's questionnaire, the interview, and the physical examination. Because the information was provided in large part by the patient, it cannot be guaranteed that it has not been purposely or unconsciously manipulated. Every effort has been made to obtain as much relevant data as possible for this evaluation. It is important to note that the conclusions that lead to this procedure are derived in large part from the available data. Always take into account that the treatment will also be dependent on availability of resources and existing treatment guidelines, considered by other Pain Management Practitioners as being common knowledge and practice, at the time of the intervention. For Medico-Legal purposes, it is also important to point out that variation in procedural techniques and pharmacological choices are the acceptable norm. The indications, contraindications, technique, and results of the above procedure should only be interpreted and judged by a Board-Certified Interventional Pain Specialist with extensive familiarity and expertise in the same exact procedure and technique.

## 2022-02-05 NOTE — Progress Notes (Signed)
Nursing Pain Medication Assessment:  Safety precautions to be maintained throughout the outpatient stay will include: orient to surroundings, keep bed in low position, maintain call bell within reach at all times, provide assistance with transfer out of bed and ambulation.  Medication Inspection Compliance: Pill count conducted under aseptic conditions, in front of the patient. Neither the pills nor the bottle was removed from the patient's sight at any time. Once count was completed pills were immediately returned to the patient in their original bottle.  Medication: Morphine ER (MSContin) Pill/Patch Count:  17 of 60 pills remain Pill/Patch Appearance: Markings consistent with prescribed medication Bottle Appearance: Standard pharmacy container. Clearly labeled. Filled Date: 05 / 18 / 2023 Last Medication intake:  Today

## 2022-02-06 ENCOUNTER — Telehealth: Payer: Self-pay | Admitting: *Deleted

## 2022-02-06 MED FILL — Medication: INTRATHECAL | Qty: 1 | Status: AC

## 2022-02-06 NOTE — Telephone Encounter (Signed)
Post pump refill;  voicemail left for patient to call if there are questions or concerns.

## 2022-02-10 LAB — TOXASSURE SELECT 13 (MW), URINE

## 2022-03-11 DIAGNOSIS — I10 Essential (primary) hypertension: Secondary | ICD-10-CM | POA: Diagnosis not present

## 2022-03-11 DIAGNOSIS — I480 Paroxysmal atrial fibrillation: Secondary | ICD-10-CM | POA: Diagnosis not present

## 2022-03-11 DIAGNOSIS — M79605 Pain in left leg: Secondary | ICD-10-CM | POA: Diagnosis not present

## 2022-03-11 DIAGNOSIS — M25562 Pain in left knee: Secondary | ICD-10-CM | POA: Diagnosis not present

## 2022-03-11 DIAGNOSIS — R69 Illness, unspecified: Secondary | ICD-10-CM | POA: Diagnosis not present

## 2022-03-11 DIAGNOSIS — Z96652 Presence of left artificial knee joint: Secondary | ICD-10-CM | POA: Diagnosis not present

## 2022-03-24 NOTE — Progress Notes (Unsigned)
Office Visit    Patient Name: Kendra Nelson Date of Encounter: 03/25/2022  PCP:  Berkley Harvey, NP   [place]  Cardiologist:  Elouise Munroe, MD  Advanced Practice Provider:  No care team member to display Electrophysiologist:  None   Chief Complaint    Kendra Nelson is a 61 y.o. female with a past medical history significant for hypertension, hyperlipidemia, chronic back pain status post spinal cord stimulator, atrial fibrillation status post ablation 06/13/2021 presents today for follow-up visit.  She was seen last 08/2021.  She presented for electrophysiology evaluation.  She denies symptoms of palpitations, chest pain, shortness of breath, orthopnea, PND, lower extremity edema, claudication, dizziness, presyncope, syncope, bleeding, or neurologic sequelae.  Patient was tolerating medications well without any difficulties.  She noted no further episodes of atrial fibrillation.  She did state her blood pressures have been elevated.  Due to her low stroke risk her Eliquis was discontinued after her ablation.  CHA2DS2-VASc of 2.  Today, she feels pretty good however she has noticed her blood pressure has been all over the place.  Today it was 144/90.  She states this is good for her.  She was at the dentist office and reportedly had a blood pressure of 240/119.  She also states its been high at her primary care providers office and she was told to go to the ED.  She says that she does have a way to take it at home and it is all over the place.  She currently is on lisinopril 40 mg daily and Coreg 6.25 mg twice a day.  When I retook her blood pressure is 148/90.  We discussed following up with a blood pressure check in 2 weeks and in the meantime I have asked her to take her blood pressure once a day an hour after morning medications and write down these readings.  Please send them to me via MyChart.  We have started her on 5 mg of Norvasc today.  Reports  no shortness of breath nor dyspnea on exertion. Reports no chest pain, pressure, or tightness. No edema, orthopnea, PND. Reports no palpitations.    Past Medical History    Past Medical History:  Diagnosis Date   Anxiety    off of valium- stress   Arthritis    left knee- rhumatoid- hasnt seen specialist    Chronic lower back pain    Complication of anesthesia    WOKE DURING BACK SURGERY ; urinary retention 12/20/2017   Hyperlipidemia    Hypertension    Migraine    "when seasons change" (12/20/2017)   Neuromuscular disorder (HCC)    Restless leg syndrome    Seasonal allergies    Skin abnormalities    Wears glasses    Past Surgical History:  Procedure Laterality Date   ATRIAL FIBRILLATION ABLATION N/A 06/13/2021   Procedure: ATRIAL FIBRILLATION ABLATION;  Surgeon: Constance Haw, MD;  Location: [facility];  Service: Cardiovascular;  Laterality: N/A;   BACK SURGERY     8-9 SURGERIES; lower back   COLONOSCOPY     INTRATHECAL PUMP REVISION N/A 05/09/2019   Procedure: Intrathecal pump change;  Surgeon: Clydell Hakim, MD;  Location: [facility];  Service: Neurosurgery;  Laterality: N/A;  Intrathecal pump change   JOINT REPLACEMENT     PAIN PUMP IMPLANTATION  05/10/2012   Procedure: PAIN PUMP INSERTION;  Surgeon: Erline Levine, MD;  Location: [facility];  Service: Neurosurgery;  Laterality: N/A;  Pump replacement   SPINAL CORD STIMULATOR BATTERY EXCHANGE N/A 06/09/2016   Procedure: IMPLANTABLE PULSE GENERATOR REPLACEMENT WITH RECHARGEABLE BATTERY;  Surgeon: Maeola Harman, MD;  [facility];  Service: Neurosurgery;  Laterality: N/A;   TOTAL KNEE ARTHROPLASTY Left 12/20/2017   TOTAL KNEE ARTHROPLASTY Left 12/20/2017   Procedure: TOTAL KNEE ARTHROPLASTY;  Surgeon: Gean Birchwood, MD;  Location: [facility];  Service: Orthopedics;  Laterality: Left;   TOTAL KNEE REVISION Left 12/19/2020   Procedure: TOTAL KNEE REVISION/ Total Knee Polynethylene Barring, Attune Total Knee;  Surgeon: Gean Birchwood, MD;  Location: [facility];  Service: Orthopedics;  Laterality: Left;   VAGINAL HYSTERECTOMY      Allergies  Allergies  Allergen Reactions   Benadryl [Diphenhydramine Hcl] Hives and Rash   Bee Venom Other (See Comments)    Lightheaded, feverish, shortness of breath, flu like symptoms     Dexamethasone Other (See Comments)    Systemic burning sensation with IV formulation.     EKGs/Labs/Other Studies Reviewed:   The following studies were reviewed today:  TTE 12/20/20  Review of the above records today demonstrates:   1. Left ventricular ejection fraction, by estimation, is 60 to 65%. The  left ventricle has normal function. The left ventricle has no regional  wall motion abnormalities. Left ventricular diastolic parameters are  consistent with Grade II diastolic  dysfunction (pseudonormalization).   2. Right ventricular systolic function is normal. The right ventricular  size is normal.   3. Left atrial size was moderately dilated.   4. The mitral valve is normal in structure. Trivial mitral valve  regurgitation. No evidence of mitral stenosis.   5. The aortic valve is normal in structure. Aortic valve regurgitation is  not visualized. No aortic stenosis is present.   6. The inferior vena cava is dilated in size with >50% respiratory  variability, suggesting right atrial pressure of 8 mmHg.    Myoview 01/17/21 The left ventricular ejection fraction is normal (55-65%). Nuclear stress EF: 55%. No T wave inversion was noted during stress. There was no ST segment deviation noted during stress. This is a low risk study.  EKG:  EKG is  ordered today.  The ekg ordered today demonstrates NSR rate 63 bpm  Recent Labs: 06/06/2021: ALT 29; BUN 12; Creatinine, Ser 0.80; Hemoglobin 15.9; Platelets 209; Potassium 4.1; Sodium 140  Recent Lipid Panel No results found for: "CHOL", "TRIG", "HDL", "CHOLHDL", "VLDL", "LDLCALC", "LDLDIRECT"    Home Medications   Current Meds   Medication Sig   AMBULATORY NON FORMULARY MEDICATION Medication Name:  Intrathecal pump Fentanyl 1,000.0 mcg/ml Bupivacaine 20.0 mg/ml 40 ml pump Rate 357.2 mcg/day   amLODipine (NORVASC) 5 MG tablet Take 1 tablet (5 mg total) by mouth daily.   Biotin w/ Vitamins C & E (HAIR/SKIN/NAILS PO) Take 3 tablets by mouth daily.   Calcium Citrate-Vitamin D (CALCIUM + D PO) Take 1 tablet by mouth daily.   morphine (MS CONTIN) 30 MG 12 hr tablet Take 1 tablet (30 mg total) by mouth every 12 (twelve) hours. Must last 30 days. Do not break tablet   [START ON 04/15/2022] morphine (MS CONTIN) 30 MG 12 hr tablet Take 1 tablet (30 mg total) by mouth every 12 (twelve) hours. Must last 30 days. Do not break tablet   naloxone (NARCAN) nasal spray 4 mg/0.1 mL Place 1 spray into the nose as needed for up to 365 doses (for opioid-induced respiratory depresssion). In case of emergency (overdose), spray once into  each nostril. If no response within 3 minutes, repeat application and call 911.   [DISCONTINUED] atorvastatin (LIPITOR) 20 MG tablet Take 20 mg by mouth every morning.    [DISCONTINUED] carvedilol (COREG) 6.25 MG tablet TAKE 1 TABLET BY MOUTH TWICE A DAY   [DISCONTINUED] furosemide (LASIX) 20 MG tablet Take 1 tablet by  mouth daily for the next 3 days then only as needed for weight gain/swelling   [DISCONTINUED] lisinopril (ZESTRIL) 20 MG tablet Take 2 tablets (40 mg total) by mouth every morning.   [DISCONTINUED] metoprolol tartrate (LOPRESSOR) 25 MG tablet Take 25 mg by mouth 2 (two) times daily as needed (palpitations).     Review of Systems      All other systems reviewed and are otherwise negative except as noted above.  Physical Exam    VS:  BP (!) 148/90   Pulse 63   Ht 5\' 3"  (1.6 m)   Wt 182 lb (82.6 kg)   SpO2 98%   BMI 32.24 kg/m  , BMI Body mass index is 32.24 kg/m.  Wt Readings from Last 3 Encounters:  03/25/22 182 lb (82.6 kg)  02/05/22 175 lb (79.4 kg)  10/30/21 175 lb (79.4 kg)      GEN: Well nourished, well developed, in no acute distress. HEENT: normal. Neck: Supple, no JVD, carotid bruits, or masses. Cardiac: RRR, no murmurs, rubs, or gallops. No clubbing, cyanosis, edema.  Radials/PT 2+ and equal bilaterally.  Respiratory:  Respirations regular and unlabored, clear to auscultation bilaterally. GI: Soft, nontender, nondistended. MS: No deformity or atrophy. Skin: Warm and dry, no rash. Neuro:  Strength and sensation are intact. Psych: Normal affect.  Assessment & Plan    Paroxysmal atrial fibrillation/flutter status post ablation 06/13/2021 -she has not had any further Afib -She was taken off her blood thinner -She will follow-up with Dr. 06/15/2021 in Oct  Hypertension -144/90 today, 148/90 on repeat -Start Norvasc 5mg  daily -Continue Lisinopril 40mg  daily and coreg 6.25mg  BID -States it was really high at her dentist office -She does have a cuff at home and she plans to monitor her BP -Will set her up for a PharmD appointment for BP check  Hyperlipidemia -primary looks after her cholesterol  -continue Lipitor 20mg  daily   Follow-up in 2 weeks with Pharm D for BP check  Disposition: Follow up 6 months with Nov, MD or APP.  Signed, , PA-C 03/25/2022, 2:55 PM  [facility]

## 2022-03-25 ENCOUNTER — Encounter: Payer: Self-pay | Admitting: Physician Assistant

## 2022-03-25 ENCOUNTER — Ambulatory Visit: Payer: Medicare HMO | Admitting: Physician Assistant

## 2022-03-25 ENCOUNTER — Ambulatory Visit (INDEPENDENT_AMBULATORY_CARE_PROVIDER_SITE_OTHER): Payer: Medicare HMO | Admitting: Physician Assistant

## 2022-03-25 VITALS — BP 148/90 | HR 63 | Ht 63.0 in | Wt 182.0 lb

## 2022-03-25 DIAGNOSIS — I493 Ventricular premature depolarization: Secondary | ICD-10-CM | POA: Diagnosis not present

## 2022-03-25 DIAGNOSIS — E785 Hyperlipidemia, unspecified: Secondary | ICD-10-CM

## 2022-03-25 DIAGNOSIS — I484 Atypical atrial flutter: Secondary | ICD-10-CM | POA: Diagnosis not present

## 2022-03-25 DIAGNOSIS — I480 Paroxysmal atrial fibrillation: Secondary | ICD-10-CM | POA: Diagnosis not present

## 2022-03-25 DIAGNOSIS — I10 Essential (primary) hypertension: Secondary | ICD-10-CM | POA: Diagnosis not present

## 2022-03-25 MED ORDER — FUROSEMIDE 20 MG PO TABS: 20.0000 mg | ORAL_TABLET | Freq: Every day | ORAL | 2 refills | Status: DC

## 2022-03-25 MED ORDER — AMLODIPINE BESYLATE 5 MG PO TABS: 5.0000 mg | ORAL_TABLET | Freq: Every day | ORAL | 3 refills | Status: DC

## 2022-03-25 MED ORDER — CARVEDILOL 6.25 MG PO TABS: 6.2500 mg | ORAL_TABLET | Freq: Two times a day (BID) | ORAL | 3 refills | Status: DC

## 2022-03-25 MED ORDER — ATORVASTATIN CALCIUM 20 MG PO TABS: 20.0000 mg | ORAL_TABLET | ORAL | 3 refills | Status: AC

## 2022-03-25 MED ORDER — LISINOPRIL 20 MG PO TABS: 40.0000 mg | ORAL_TABLET | ORAL | 3 refills | Status: DC

## 2022-03-25 NOTE — Patient Instructions (Addendum)
Medication Instructions:  Your physician has recommended you make the following change in your medication:  1-START Amlodipine 5 mg by mouth daily.  *If you need a refill on your cardiac medications before your next appointment, please call your pharmacy*  Lab Work: If you have labs (blood work) drawn today and your tests are completely normal, you will receive your results only by: MyChart Message (if you have MyChart) OR A paper copy in the mail If you have any lab test that is abnormal or we need to change your treatment, we will call you to review the results.  Testing/Procedures: None ordered today.  Follow-Up: At [facility], you and your health needs are our priority.  As part of our continuing mission to provide you with exceptional heart care, we have created designated Provider Care Teams.  These Care Teams include your primary Cardiologist (physician) and Advanced Practice Providers (APPs -  Physician Assistants and Nurse Practitioners) who all work together to provide you with the care you need, when you need it.  We recommend signing up for the patient portal called "MyChart".  Sign up information is provided on this After Visit Summary.  MyChart is used to connect with patients for Virtual Visits (Telemedicine).  Patients are able to view lab/test results, encounter notes, upcoming appointments, etc.  Non-urgent messages can be sent to your provider as well.   To learn more about what you can do with MyChart, go to [email or web address].    Your next appointment:   6 month(s)  The format for your next appointment:   In Person  Provider:   Jari Favre, NP  Other Instructions Your physician recommends that you schedule a follow-up appointment in: 3 months with Dr. Elberta Fortis.  You have been referred to Pharmacist for hypertension clinic.  Please check your blood pressure once a day for 2 weeks and send our office the readings.   Important Information About  [place]

## 2022-04-01 ENCOUNTER — Telehealth: Payer: Self-pay | Admitting: Physician Assistant

## 2022-04-01 NOTE — Telephone Encounter (Signed)
Spoke with the patient and gave advisement from [place]. Patient will stop amlodipine and furosemide. Patient states that she has been taking lisinopril 40 mg for several years now. She only started feeling bad after adding amlodipine and furosemide. She would like to remain on lisinopril 40 mg daily because she is worried that her BP will spike. She will continue to monitor symptoms, blood pressure, and weight and keep [place] updated.

## 2022-04-01 NOTE — Telephone Encounter (Signed)
Spoke with the patient who states that she has been having lightheadedness and dizziness since she started amlodipine. She stared two days ago. She reports that she is having to lean on things and sit down to feel better. She has not had any syncope. States that dizziness comes in waves. She reports the following BP/HR over the last couple of days: 90/67, 90/71, 110/81;74, 103/80; 76, 110/70; 67, 99/77, 98/78. Patient has taken all of her blood pressure medications this morning.  Encouraged patient to change positions slowly. Eat small salty snack for low BP. Will route to [facility] for further recommendations.

## 2022-04-01 NOTE — Telephone Encounter (Signed)
Pt c/o medication issue:  1. Name of Medication:   furosemide (LASIX) 20 MG tablet  amLODipine (NORVASC) 5 MG tablet  2. How are you currently taking this medication (dosage and times per day)?  Take 1 tablet (5 mg total) by mouth daily. - amlodipine  Take 1 tablet (20 mg total) by mouth daily. - furosemide  3. Are you having a reaction (difficulty breathing--STAT)? no  4. What is your medication issue? Pt states this medication prescribed is making her nauseated and dizzy. She states both symptoms come and go. She states she know that [place], [place] told her this may happen but she wants to know how long.

## 2022-04-13 DIAGNOSIS — L600 Ingrowing nail: Secondary | ICD-10-CM | POA: Diagnosis not present

## 2022-04-13 DIAGNOSIS — B351 Tinea unguium: Secondary | ICD-10-CM | POA: Diagnosis not present

## 2022-04-14 NOTE — Progress Notes (Unsigned)
Patient ID: Kendra Nelson                 DOB: 08-26-1961                      MRN: [id number]     HPI: Kendra Nelson is a 61 y.o. female of Dr. Jacques Navy, referred by Jari Favre, PA-C to HTN clinic. PMH is significant for HTN, HLD, chronic back pain s/p spinal cord stimulator, and afib s/p ablation 06/13/21. Pt was seen by Tessa on 03/25/22 and BP was 148/90. Pt reported taking BP at home but it was all over the place. Started pt on amlodipine 5 mg daily and continued lisinopril 40 mg daily, lasix 20 mg daily and coreg 6.25 mg BID with recommendation to have pt take BP at home once a day one hr after medications and plan to f/u at PharmD clinic in 2 weeks. Amlodipine was stopped along with furosemide a few days later due to lightheadedness and dizziness, where home Bps were 90/67, 90/71, 110/81, 103/80, 110/70, 99/77 and 98/78. Pt was informed to reduce lisinopril to 20 mg daily but patient prefers to keep taking at 40 mg daily as she is concerned her BP will spike.   HR have been low 60s, probably would not incr BB  Last BMET 06/06/21 na 140, K 4.1, Scr 0.80 - likely need new labs if going to consider hctz if needed   Current HTN meds: lisinopril 40 mg daily, carvedilol 6.25 mg BID Previously tried: lasix 20 mg daily (stopped when stopped amlodipine in the past), toprol-xl 25 mg daily, propranolol 10 mg BID (switched to metoprolol), amlodipine 5 mg daily (lightheaded, dizzy)  BP goal: <130/80  Family History:  Aortic aneurysm in her mother, Diabetes in her maternal grandmother.   Social History: quit smoking (30 pack year hx), does not drink alcohol   Diet:   Exercise:   Home BP readings:   Wt Readings from Last 3 Encounters:  03/25/22 182 lb (82.6 kg)  02/05/22 175 lb (79.4 kg)  10/30/21 175 lb (79.4 kg)   BP Readings from Last 3 Encounters:  03/25/22 (!) 148/90  02/05/22 (!) 200/94  10/30/21 (!) 219/112   Pulse Readings from Last 3 Encounters:  03/25/22 63  02/05/22 68   10/30/21 72    Renal function: CrCl cannot be calculated (Patient's most recent lab result is older than the maximum 21 days allowed.).  Past Medical History:  Diagnosis Date   Anxiety    off of valium- stress   Arthritis    left knee- rhumatoid- hasnt seen specialist    Chronic lower back pain    Complication of anesthesia    WOKE DURING BACK SURGERY ; urinary retention 12/20/2017   Hyperlipidemia    Hypertension    Migraine    "when seasons change" (12/20/2017)   Neuromuscular disorder (HCC)    Restless leg syndrome    Seasonal allergies    Skin abnormalities    Wears glasses     Current Outpatient Medications on File Prior to Visit  Medication Sig Dispense Refill   AMBULATORY NON FORMULARY MEDICATION Medication Name:  Intrathecal pump Fentanyl 1,000.0 mcg/ml Bupivacaine 20.0 mg/ml 40 ml pump Rate 357.2 mcg/day     atorvastatin (LIPITOR) 20 MG tablet Take 1 tablet (20 mg total) by mouth every morning. 90 tablet 3   Biotin w/ Vitamins C & E (HAIR/SKIN/NAILS PO) Take 3 tablets by mouth daily.  Calcium Citrate-Vitamin D (CALCIUM + D PO) Take 1 tablet by mouth daily.     carvedilol (COREG) 6.25 MG tablet Take 1 tablet (6.25 mg total) by mouth 2 (two) times daily. 180 tablet 3   lisinopril (ZESTRIL) 20 MG tablet Take 2 tablets (40 mg total) by mouth every morning. 90 tablet 3   morphine (MS CONTIN) 30 MG 12 hr tablet Take 1 tablet (30 mg total) by mouth every 12 (twelve) hours. Must last 30 days. Do not break tablet 60 tablet 0   morphine (MS CONTIN) 30 MG 12 hr tablet Take 1 tablet (30 mg total) by mouth every 12 (twelve) hours. Must last 30 days. Do not break tablet 60 tablet 0   [START ON 04/15/2022] morphine (MS CONTIN) 30 MG 12 hr tablet Take 1 tablet (30 mg total) by mouth every 12 (twelve) hours. Must last 30 days. Do not break tablet 60 tablet 0   naloxone (NARCAN) nasal spray 4 mg/0.1 mL Place 1 spray into the nose as needed for up to 365 doses (for opioid-induced  respiratory depresssion). In case of emergency (overdose), spray once into each nostril. If no response within 3 minutes, repeat application and call 911. 1 each 0   No current facility-administered medications on file prior to visit.    Allergies  Allergen Reactions   Benadryl [Diphenhydramine Hcl] Hives and Rash   Bee Venom Other (See Comments)    Lightheaded, feverish, shortness of breath, flu like symptoms     Dexamethasone Other (See Comments)    Systemic burning sensation with IV formulation.    There were no vitals taken for this visit.   Assessment/Plan:  1. Hypertension -    Thank you  Larena Sox, PharmD PGY1 Pharmacy Resident   04/14/2022  12:33 PM

## 2022-04-15 ENCOUNTER — Ambulatory Visit: Payer: Medicare HMO

## 2022-04-17 ENCOUNTER — Other Ambulatory Visit: Payer: Self-pay

## 2022-04-17 MED ORDER — PAIN MANAGEMENT IT PUMP REFILL: 1.0000 | Freq: Once | INTRATHECAL | 0 refills | Status: AC

## 2022-04-17 NOTE — Progress Notes (Signed)
It

## 2022-05-01 DIAGNOSIS — J1189 Influenza due to unidentified influenza virus with other manifestations: Secondary | ICD-10-CM | POA: Diagnosis not present

## 2022-05-13 NOTE — Progress Notes (Signed)
PROVIDER NOTE: Interpretation of information contained herein should be left to medically-trained personnel. Specific patient instructions are provided elsewhere under "Patient Instructions" section of medical record. This document was created in part using STT-dictation technology, any transcriptional errors that may result from this process are unintentional.  Patient: Kendra Nelson Type: Established DOB: 07-Jul-1961 MRN: [id number] PCP: Berkley Harvey, NP  Service: Procedure DOS: 05/14/2022 Setting: Ambulatory Location: Ambulatory outpatient facility Delivery: Face-to-face Provider: Gaspar Cola, MD Specialty: Interventional Pain Management Specialty designation: 09 Location: Outpatient facility Ref. Prov.: Berkley Harvey, NP    Primary Reason for Visit: Interventional Pain Management Treatment. CC: Back Pain (lower)  Procedure:          Type: Management of Intrathecal Drug Delivery System (IDDS) - Reservoir Refill [phone]). No rate change.  Indications: 1. 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 Chronic low back pain (1ry area of Pain) (Bilateral) (R>L) w/ sciatica (Bilateral)   4. Chronic lower extremity pain (2ry area of Pain) (Bilateral) (R>L)   5. Chronic lumbosacral radiculopathy (L5) (Left)   6. DDD (degenerative disc disease), lumbar   7. Lumbar facet syndrome (Bilateral) (R>L)   8. Presence of neurostimulator   9. Presence of intrathecal pump   10. Pharmacologic therapy   11. Long term prescription opiate use   12. Chronic use of opiate for therapeutic purpose   13. Encounter for adjustment and management of infusion pump   14. Encounter for medication management   15. Encounter for chronic pain management    Pain Assessment: Self-Reported Pain Score: 5 /10             Reported level is compatible with observation.        RTCB: 08/13/2022  Pharmacotherapy Assessment  Analgesic: Morphine (MS  Contin) 30 mg PO BID (60 mg/day of morphine) (60 MME/day) +  PF-(intrathecal)-Fentanyl (13.4 mcg/hr)  changed from Oxycodone IR 10 mg 4x/day (40 mg/day of oxycodone) (60 MME/day), due to national shortage MME/day: 60 MME/day (oral) + 32.16 MME/day (intrathecal) = 93.16 MME/day (Aprox. 1.16 MME/day/kg).   Monitoring: [place] PMP: PDMP reviewed during this encounter.       Pharmacotherapy: No side-effects or adverse reactions reported. Compliance: No problems identified. Effectiveness: Clinically acceptable.  Landis Martins, RN  05/14/2022 10:58 AM  Sign when Signing Visit Nursing Pain Medication Assessment:  Safety precautions to be maintained throughout the outpatient stay will include: orient to surroundings, keep bed in low position, maintain call bell within reach at all times, provide assistance with transfer out of bed and ambulation.  Medication Inspection Compliance: Pill count conducted under aseptic conditions, in front of the patient. Neither the pills nor the bottle was removed from the patient's sight at any time. Once count was completed pills were immediately returned to the patient in their original bottle.  Medication: Morphine ER (MSContin) Pill/Patch Count:  2 of 60 pills remain Pill/Patch Appearance: Markings consistent with prescribed medication Bottle Appearance: Standard pharmacy container. Clearly labeled. Filled Date: 08 / 16 / 2023 Last Medication intake:  Yesterday    No results found for: "CBDTHCR" No results found for: "D8THCCBX" No results found for: "D9THCCBX"  UDS:  Summary  Date Value Ref Range Status  02/05/2022 Note  Final    Comment:    ==================================================================== [employer] Select 13 (MW) ==================================================================== Test  Result       Flag       Units  Drug Present and Declared for Prescription Verification   Morphine                        >16129       EXPECTED   ng/mg creat   Normorphine                    671          EXPECTED   ng/mg creat    Potential sources of large amounts of morphine in the absence of    codeine include administration of morphine or use of heroin.     Normorphine is an expected metabolite of morphine.    Fentanyl                       11           EXPECTED   ng/mg creat   Norfentanyl                    90           EXPECTED   ng/mg creat    Source of fentanyl is a scheduled prescription medication, including    IV, patch, and transmucosal formulations. Norfentanyl is an expected    metabolite of fentanyl.  ==================================================================== Test                      Result    Flag   Units      Ref Range   Creatinine              62               mg/dL      >=20 ==================================================================== Declared Medications:  The flagging and interpretation on this report are based on the  following declared medications.  Unexpected results may arise from  inaccuracies in the declared medications.   **Note: The testing scope of this panel includes these medications:   Fentanyl  Morphine (MS Contin)   **Note: The testing scope of this panel does not include the  following reported medications:   Atorvastatin (Lipitor)  Biotin  Bupivacaine  Calcium  Carvedilol (Coreg)  Furosemide (Lasix)  Lisinopril (Zestril)  Metoprolol (Lopressor)  Naloxone (Narcan)  Vitamin C  Vitamin D  Vitamin E ==================================================================== For clinical consultation, please call [phone]. ====================================================================          Intrathecal Drug Delivery System (IDDS)  Pump Device:  Manufacturer: Medtronic Model: Synchromed II Model No.: [id number] Serial No.: [id number] Delivery Route: Intrathecal Type: Programmable  Volume (mL): 40 mL reservoir Priming  Volume: n/a  Calibration Constant: 120.0  MRI compatibility: Conditional   Implant Details:  Date: 05/09/2019 Implanter: Clydell Hakim, MD  Contact Information: [facility] Last Revision/Replacement: 05/09/2019 Estimated Replacement Date: May/2027  Implant Site: Abdominal Laterality: Left  Catheter: Manufacturer: Medtronic Model:  (two piece) Model No.: [id number]  Serial No.: n/a  Implanted Length (cm): 38.1  Catheter Volume (mL): 0.214  Tip Location (Level): T6-7 Canal Access Site: n/a  Drug content:  Primary Medication Class: Opioid  Medication: PF-Fentanyl  Concentration: 1,000 mcg/mL   Secondary Medication Class: Local Anesthetic  Medication: PF-Bupivacaine  Concentration: 20.0 mg/mL   Tertiary Medication Class: none   PA parameters (PCA-mode):  Mode: Off (Inactive)  Programming:  Type: Simple continuous.  Medication, Concentration, Infusion Program, & Delivery Rate: For up-to-date details please see most recent scanned programming printout.      Changes:  Medication Change: None at this point Rate Change: No change in rate  Reported side-effects or adverse reactions: None reported  Effectiveness: Described as relatively effective, allowing for increase in activities of daily living (ADL) Clinically meaningful improvement in function (CMIF): Sustained CMIF goals met  Plan: Pump refill today   Pharmacotherapy Assessment   Opioid Analgesic: Morphine (MS Contin) 30 mg PO BID (60 mg/day of morphine) (60 MME/day) +  PF-(intrathecal)-Fentanyl (13.4 mcg/hr)  changed from Oxycodone IR 10 mg 4x/day (40 mg/day of oxycodone) (60 MME/day), due to national shortage MME/day: 60 MME/day (oral) + 32.16 MME/day (intrathecal) = 93.16 MME/day (Aprox. 1.16 MME/day/kg).   Monitoring: [place] PMP: PDMP reviewed during this encounter.       Pharmacotherapy: No side-effects or adverse reactions reported. Compliance: No problems identified. Effectiveness:  Clinically acceptable. Plan: Refer to "POC". UDS:  Summary  Date Value Ref Range Status  02/05/2022 Note  Final    Comment:    ==================================================================== Tox[employer]SU[employer] Select 13 (MW) ==================================================================== Test                             Result       Flag       Units  Drug Present and Declared for Prescription Verification   Morphine                       >16129       EXPECTED   ng/mg creat   Normorphine                    671          EXPECTED   ng/mg creat    Potential sources of large amounts of morphine in the absence of    codeine include administration of morphine or use of heroin.     Normorphine is an expected metabolite of morphine.    Fentanyl                       11           EXPECTED   ng/mg creat   Norfentanyl                    90           EXPECTED   ng/mg creat    Source of fentanyl is a scheduled prescription medication, including    IV, patch, and transmucosal formulations. Norfentanyl is an expected    metabolite of fentanyl.  ==================================================================== Test                      Result    Flag   Units      Ref Range   Creatinine              62               mg/dL      >=20 ==================================================================== Declared Medications:  The flagging and interpretation on this report are based on the  following declared medications.  Unexpected results may arise from  inaccuracies in the declared medications.   **Note: The testing scope of this panel includes these medications:   Fentanyl  Morphine (MS Contin)   **Note: The testing scope  of this panel does not include the  following reported medications:   Atorvastatin (Lipitor)  Biotin  Bupivacaine  Calcium  Carvedilol (Coreg)  Furosemide (Lasix)  Lisinopril (Zestril)  Metoprolol (Lopressor)  Naloxone (Narcan)  Vitamin C  Vitamin D  Vitamin  E ==================================================================== For clinical consultation, please call [phone]. ====================================================================    No results found for: "CBDTHCR", "D8THCCBX", "D9THCCBX"   Pre-op H&P Assessment:  Ms. Yarde is a 61 y.o. (year old), female patient, seen today for interventional treatment. 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 Ms. Avallone has a current medication list which includes the following prescription(s): AMBULATORY NON FORMULARY MEDICATION, amlodipine, atorvastatin, carvedilol, furosemide, lisinopril, naloxone, biotin w/ vitamins c & e, calcium citrate-vitamin d, [START ON 05/15/2022] morphine, [START ON 06/14/2022] morphine, and [START ON 07/14/2022] morphine. Her primarily concern today is the Back Pain (lower)  Initial Vital Signs:  Pulse/HCG Rate: 78  Temp: (!) 96.3 F (35.7 C) Resp: 16 BP: (!) 156/71 SpO2: 100 %  BMI: Estimated body mass index is 30.65 kg/m as calculated from the following:   Height as of this encounter: '5\' 3"'  (1.6 m).   Weight as of this encounter: 173 lb (78.5 kg).  Risk Assessment: Allergies: Reviewed. She is allergic to benadryl [diphenhydramine hcl], bee venom, and dexamethasone.  Allergy Precautions: None required Coagulopathies: Reviewed. None identified.  Blood-thinner therapy: None at this time Active Infection(s): Reviewed. None identified. Ms. Arrey is afebrile  Site Confirmation: Ms. Rill was asked to confirm the procedure and laterality before marking the  site Procedure checklist: Completed Consent: Before the procedure and under the influence of no sedative(s), amnesic(s), or anxiolytics, the patient was informed of the treatment options, risks and possible complications. To fulfill our ethical and legal obligations, as recommended by the American Medical Association's Code of Ethics, I have informed the patient of my clinical impression; the nature and purpose of the treatment or procedure; the risks, benefits, and possible complications of the intervention; the alternatives, including doing nothing; the risk(s) and benefit(s) of the alternative treatment(s) or procedure(s); and the risk(s) and benefit(s) of doing nothing.  Ms. Plouffe was provided with information about the general risks and possible complications associated with most interventional procedures. These include, but are not limited to: failure to achieve desired goals, infection, bleeding, organ or nerve damage, allergic reactions, paralysis, and/or death.  In addition, she was informed of those risks and possible complications associated to this particular procedure, which include, but are not limited to: damage to the implant; failure to decrease pain; local, systemic, or serious CNS infections, intraspinal abscess with possible cord compression and paralysis, or life-threatening such as meningitis; bleeding; organ damage; nerve injury or damage with subsequent sensory, motor, and/or autonomic system dysfunction, resulting in transient or permanent pain, numbness, and/or weakness of one or several areas of the body; allergic reactions, either minor or major life-threatening, such as anaphylactic or anaphylactoid reactions.  Furthermore, Ms. Braddock was informed of those risks and complications associated with the medications. These include, but are not limited to: allergic reactions (i.e.: anaphylactic or anaphylactoid reactions); endorphine suppression; bradycardia and/or hypotension;  water retention and/or peripheral vascular relaxation leading to lower extremity edema and possible stasis ulcers; respiratory depression and/or shortness of breath;  decreased metabolic rate leading to weight gain; swelling or edema; medication-induced neural toxicity; particulate matter embolism and blood vessel occlusion with resultant organ, and/or nervous system infarction; and/or intrathecal granuloma formation with possible spinal cord compression and permanent paralysis.  Before refilling the pump Ms. Nelson was informed that some of the medications used in the devise may not be FDA approved for such use and therefore it constitutes an off-label use of the medications.  Finally, she was informed that Medicine is not an exact science; therefore, there is also the possibility of unforeseen or unpredictable risks and/or possible complications that may result in a catastrophic outcome. The patient indicated having understood very clearly. We have given the patient no guarantees and we have made no promises. Enough time was given to the patient to ask questions, all of which were answered to the patient's satisfaction. Ms. Koplin has indicated that she wanted to continue with the procedure. Attestation: I, the ordering provider, attest that I have discussed with the patient the benefits, risks, side-effects, alternatives, likelihood of achieving goals, and potential problems during recovery for the procedure that I have provided informed consent. Date  Time:   Pre-Procedure Preparation:  Monitoring: As per clinic protocol. Respiration, ETCO2, SpO2, BP, heart rate and rhythm monitor placed and checked for adequate function Safety Precautions: Patient was assessed for positional comfort and pressure points before starting the procedure. Time-out: I initiated and conducted the "Time-out" before starting the procedure, as per protocol. The patient was asked to participate by confirming the accuracy  of the "Time Out" information. Verification of the correct person, site, and procedure were performed and confirmed by me, the nursing staff, and the patient. "Time-out" conducted as per Joint Commission's Universal Protocol (UP.01.01.01). Time: 1055  Description of Procedure:          Position: Supine Target Area: Central-port of intrathecal pump. Approach: Anterior, 90 degree angle approach. Area Prepped: Entire Area around the pump implant. DuraPrep (Iodine Povacrylex [0.7% available iodine] and Isopropyl Alcohol, 74% w/w) Safety Precautions: Aspiration looking for blood return was conducted prior to all injections. At no point did we inject any substances, as a needle was being advanced. No attempts were made at seeking any paresthesias. Safe injection practices and needle disposal techniques used. Medications properly checked for expiration dates. SDV (single dose vial) medications used. Description of the Procedure: Protocol guidelines were followed. Two nurses trained to do implant refills were present during the entire procedure. The refill medication was checked by both healthcare providers as well as the patient. The patient was included in the "Time-out" to verify the medication. The patient was placed in position. The pump was identified. The area was prepped in the usual manner. The sterile template was positioned over the pump, making sure the side-port location matched that of the pump. Both, the pump and the template were held for stability. The needle provided in the Medtronic Kit was then introduced thru the center of the template and into the central port. The pump content was aspirated and discarded volume documented. The new medication was slowly infused into the pump, thru the filter, making sure to avoid overpressure of the device. The needle was then removed and the area cleansed, making sure to leave some of the prepping solution back to take advantage of its long term bactericidal  properties. The pump was interrogated and programmed to reflect the correct medication, volume, and dosage. The program was printed and taken to the physician for approval. Once checked and signed  by the physician, a copy was provided to the patient and another scanned into the EMR.  Vitals:   05/14/22 1058  BP: (!) 156/71  Pulse: 78  Resp: 16  Temp: (!) 96.3 F (35.7 C)  TempSrc: Temporal  SpO2: 100%  Weight: 173 lb (78.5 kg)  Height: '5\' 3"'  (1.6 m)    Start Time: 1058 hrs. End Time:   hrs. Materials & Medications: Medtronic Refill Kit Medication(s): Please see chart orders for details.  Imaging Guidance:          Type of Imaging Technique: None used Indication(s): N/A Exposure Time: No patient exposure Contrast: None used. Fluoroscopic Guidance: N/A Ultrasound Guidance: N/A Interpretation: N/A  Antibiotic Prophylaxis:   Anti-infectives (From admission, onward)    None      Indication(s): None identified  Post-operative Assessment:  Post-procedure Vital Signs:  Pulse/HCG Rate: 78  Temp: (!) 96.3 F (35.7 C) Resp: 16 BP: (!) 156/71 SpO2: 100 %  EBL: None  Complications: No immediate post-treatment complications observed by team, or reported by patient.  Note: The patient tolerated the entire procedure well. A repeat set of vitals were taken after the procedure and the patient was kept under observation following institutional policy, for this type of procedure. Post-procedural neurological assessment was performed, showing return to baseline, prior to discharge. The patient was provided with post-procedure discharge instructions, including a section on how to identify potential problems. Should any problems arise concerning this procedure, the patient was given instructions to immediately contact us, at any time, without hesitation. In any case, we plan to contact the patient by telephone for a follow-up status report regarding this interventional  procedure.  Comments:  No additional relevant information.  Plan of Care  Orders:  Orders Placed This Encounter  Procedures   PUMP REFILL    Maintain Protocol by having two(2) healthcare providers during procedure and programming.    Scheduling Instructions:     Please refill intrathecal pump today.    Order Specific Question:   Where will this procedure be performed?    Answer:   [facility]   PUMP REFILL    Whenever possible schedule on a procedure today.    Standing Status:   Future    Standing Expiration Date:   09/13/2022    Scheduling Instructions:     Please schedule intrathecal pump refill based on pump programming. Avoid schedule intervals of more than 120 days (4 months).    Order Specific Question:   Where will this procedure be performed?    Answer:   [facility]   Informed Consent Details: Physician/Practitioner Attestation; Transcribe to consent form and obtain patient signature    Transcribe to consent form and obtain patient signature.    Order Specific Question:   Physician/Practitioner attestation of informed consent for procedure/surgical case    Answer:   I, the physician/practitioner, attest that I have discussed with the patient the benefits, risks, side effects, alternatives, likelihood of achieving goals and potential problems during recovery for the procedure that I have provided informed consent.    Order Specific Question:   Procedure    Answer:   Intrathecal pump refill    Order Specific Question:   Physician/Practitioner performing the procedure    Answer:   Attending Physician: Kathlen Brunswick. Dossie Arbour, MD & designated trained staff    Order Specific Question:   Indication/Reason    Answer:   Chronic Pain Syndrome (G89.4), presence of an intrathecal pump (Z97.8)   Chronic  Opioid Analgesic:  Morphine (MS Contin) 30 mg PO BID (60 mg/day of morphine) (60 MME/day) +  PF-(intrathecal)-Fentanyl (13.4 mcg/hr)  changed from Oxycodone IR 10 mg  4x/day (40 mg/day of oxycodone) (60 MME/day), due to national shortage MME/day: 60 MME/day (oral) + 32.16 MME/day (intrathecal) = 93.16 MME/day (Aprox. 1.16 MME/day/kg).   Medications ordered for procedure: Meds ordered this encounter  Medications   morphine (MS CONTIN) 30 MG 12 hr tablet    Sig: Take 1 tablet (30 mg total) by mouth every 12 (twelve) hours. Must last 30 days. Do not break tablet    Dispense:  60 tablet    Refill:  0    DO NOT: delete (not duplicate); no partial-fill (will deny script to complete), no refill request (F/U required). DISPENSE: 1 day early if closed on fill date. WARN: No CNS-depressants within 8 hrs of med.   morphine (MS CONTIN) 30 MG 12 hr tablet    Sig: Take 1 tablet (30 mg total) by mouth every 12 (twelve) hours. Must last 30 days. Do not break tablet    Dispense:  60 tablet    Refill:  0    DO NOT: delete (not duplicate); no partial-fill (will deny script to complete), no refill request (F/U required). DISPENSE: 1 day early if closed on fill date. WARN: No CNS-depressants within 8 hrs of med.   morphine (MS CONTIN) 30 MG 12 hr tablet    Sig: Take 1 tablet (30 mg total) by mouth every 12 (twelve) hours. Must last 30 days. Do not break tablet    Dispense:  60 tablet    Refill:  0    DO NOT: delete (not duplicate); no partial-fill (will deny script to complete), no refill request (F/U required). DISPENSE: 1 day early if closed on fill date. WARN: No CNS-depressants within 8 hrs of med.   Medications administered: Hassan Rowan A. Walsworth had no medications administered during this visit.  See the medical record for exact dosing, route, and time of administration.  Follow-up plan:   Return for Pump Refill (Max:71mo.       Interventional Therapies  Risk  Complexity Considerations:   Estimated body mass index is 31 kg/m as calculated from the following:   Height as of this encounter: '5\' 3"'  (1.6 m).   Weight as of this encounter: 175 lb (79.4 kg). ELIQUIS  Anticoagulation (Stop: 3 days  Restart: 6 hours) Severe anxiety and agoraphobia  Poor candidate for RFA secondary to Lumbar hardware   Planned  Pending:   Intrathecal pump refill as per pump programming.   Under consideration:   Diagnostic left IA knee joint inj  Diagnostic bilateral L2 TFESI  Diagnostic left genicular NB  Possible left genicular nerve RFA  Diagnostic caudal ESI + epidurogram  Possible Racz procedure    Completed:   Palliative intrathecal pump refills and adjustments  [facility] (05/10/2012)  Intrathecal pump [facility] (05/09/2019)  Spinal cord [facility] (06/09/2016)    Therapeutic  Palliative (PRN) options:   Therapeutic/palliative intrathecal pump  management (analysis and refill)      Recent Visits No visits were found meeting these conditions. Showing recent visits within past 90 days and meeting all other requirements Today's Visits Date Type Provider Dept  05/14/22 Procedure visit NMilinda Pointer MD [facility]  Showing today's visits and meeting all other requirements Future Appointments Date Type Provider Dept  08/11/22 Appointment Milinda Pointer, MD [facility]  Showing future appointments within next 90 days and meeting all other requirements  Disposition: Discharge home  Discharge (Date  Time): 05/14/2022; 1115 hrs.   Primary Care Physician: Berkley Harvey, NP Location: [facility] Note by: Gaspar Cola, MD Date: 05/14/2022; Time: 11:33 AM  Disclaimer:  Medicine is not an exact science. The only guarantee in medicine is that nothing is guaranteed. It is important to note that the decision to proceed with this intervention was based on the information collected from the patient. The Data and conclusions were drawn from the  patient's questionnaire, the interview, and the physical examination. Because the information was provided in large part by the patient, it cannot be guaranteed that it has not been purposely or unconsciously manipulated. Every effort has been made to obtain as much relevant data as possible for this evaluation. It is important to note that the conclusions that lead to this procedure are derived in large part from the available data. Always take into account that the treatment will also be dependent on availability of resources and existing treatment guidelines, considered by other Pain Management Practitioners as being common knowledge and practice, at the time of the intervention. For Medico-Legal purposes, it is also important to point out that variation in procedural techniques and pharmacological choices are the acceptable norm. The indications, contraindications, technique, and results of the above procedure should only be interpreted and judged by a Board-Certified Interventional Pain Specialist with extensive familiarity and expertise in the same exact procedure and technique.

## 2022-05-14 ENCOUNTER — Encounter: Payer: Self-pay | Admitting: Pain Medicine

## 2022-05-14 ENCOUNTER — Ambulatory Visit: Payer: Medicare HMO | Attending: Pain Medicine | Admitting: Pain Medicine

## 2022-05-14 VITALS — BP 156/71 | HR 78 | Temp 96.3°F | Resp 16 | Ht 63.0 in | Wt 173.0 lb

## 2022-05-14 DIAGNOSIS — M5441 Lumbago with sciatica, right side: Secondary | ICD-10-CM

## 2022-05-14 DIAGNOSIS — M5417 Radiculopathy, lumbosacral region: Secondary | ICD-10-CM | POA: Diagnosis not present

## 2022-05-14 DIAGNOSIS — Z978 Presence of other specified devices: Secondary | ICD-10-CM

## 2022-05-14 DIAGNOSIS — M47816 Spondylosis without myelopathy or radiculopathy, lumbar region: Secondary | ICD-10-CM | POA: Diagnosis not present

## 2022-05-14 DIAGNOSIS — M5442 Lumbago with sciatica, left side: Secondary | ICD-10-CM | POA: Diagnosis not present

## 2022-05-14 DIAGNOSIS — Z79899 Other long term (current) drug therapy: Secondary | ICD-10-CM | POA: Diagnosis not present

## 2022-05-14 DIAGNOSIS — G894 Chronic pain syndrome: Secondary | ICD-10-CM | POA: Insufficient documentation

## 2022-05-14 DIAGNOSIS — M79605 Pain in left leg: Secondary | ICD-10-CM

## 2022-05-14 DIAGNOSIS — M79604 Pain in right leg: Secondary | ICD-10-CM

## 2022-05-14 DIAGNOSIS — Z451 Encounter for adjustment and management of infusion pump: Secondary | ICD-10-CM | POA: Insufficient documentation

## 2022-05-14 DIAGNOSIS — M961 Postlaminectomy syndrome, not elsewhere classified: Secondary | ICD-10-CM | POA: Diagnosis not present

## 2022-05-14 DIAGNOSIS — G8929 Other chronic pain: Secondary | ICD-10-CM

## 2022-05-14 DIAGNOSIS — Z9682 Presence of neurostimulator: Secondary | ICD-10-CM | POA: Diagnosis not present

## 2022-05-14 DIAGNOSIS — M5136 Other intervertebral disc degeneration, lumbar region: Secondary | ICD-10-CM | POA: Insufficient documentation

## 2022-05-14 DIAGNOSIS — Z79891 Long term (current) use of opiate analgesic: Secondary | ICD-10-CM | POA: Diagnosis not present

## 2022-05-14 MED ORDER — MORPHINE SULFATE ER 30 MG PO TBCR
30.0000 mg | EXTENDED_RELEASE_TABLET | Freq: Two times a day (BID) | ORAL | 0 refills | Status: DC
Start: 2022-06-14 — End: 2022-08-11

## 2022-05-14 MED ORDER — MORPHINE SULFATE ER 30 MG PO TBCR: 30.0000 mg | EXTENDED_RELEASE_TABLET | Freq: Two times a day (BID) | ORAL | 0 refills | Status: DC

## 2022-05-14 MED FILL — Medication: INTRATHECAL | Qty: 1 | Status: AC

## 2022-05-14 NOTE — Progress Notes (Signed)
Nursing Pain Medication Assessment:  Safety precautions to be maintained throughout the outpatient stay will include: orient to surroundings, keep bed in low position, maintain call bell within reach at all times, provide assistance with transfer out of bed and ambulation.  Medication Inspection Compliance: Pill count conducted under aseptic conditions, in front of the patient. Neither the pills nor the bottle was removed from the patient's sight at any time. Once count was completed pills were immediately returned to the patient in their original bottle.  Medication: Morphine ER (MSContin) Pill/Patch Count:  2 of 60 pills remain Pill/Patch Appearance: Markings consistent with prescribed medication Bottle Appearance: Standard pharmacy container. Clearly labeled. Filled Date: 08 / 16 / 2023 Last Medication intake:  Yesterday

## 2022-05-14 NOTE — Patient Instructions (Addendum)
____________________________________________________________________________________________  Pharmacy Shortages of Pain Medication   Introduction Shockingly as it may seem, .  "No U.[place] Supreme Court decision has ever interpreted the Constitution as guaranteeing a right to health care for all Americans." - https://huff.com/  "With respect to human rights, the [place] has no formally codified right to health, nor does it participate in a human rights treaty that specifies a right to health." - Scott J. Schweikart, JD, MBE  Situation By now, most of our patients have had the experience of being told by their pharmacist that they do not have enough medication to cover their prescription. If you have not had this experience, just know that you soon will.  Problem There appears to be a shortage of these medications, either at the national level or locally. This is happening with all pharmacies. When there is not enough medication, patients are offered a partial fill and they are told that they will try to get the rest of the medicine for them at a later time. If they do not have enough for even a partial fill, the pharmacists are telling the patients to call us (the prescribing physicians) to request that we send another prescription to another pharmacy to get the medicine.   This reordering of a controlled substance creates documentation problems where additional paperwork needs to be created to explain why two prescriptions for the same period of time and the same medicine are being prescribed to the same patient. It also creates situations where the last appointment note does not accurately reflect when and what prescriptions were given to a patient. This leads to prescribing errors down the line, in subsequent follow-up visits.   [employer] of Pharmacy ([employer]) Research revealed that Surveyor, quantity .1806 (21  NCAC 46.1806) authorizes pharmacists to the transfer of prescriptions among pharmacies, and it sets forth procedural and recordkeeping requirements for doing so. However, this requires the pharmacist to complete the previously mentioned procedural paperwork to accomplish the transfer. As it turns out, it is much easier for them to have the prescribing physicians do the work.   Possible solutions 1. You can ask your physician to assist you in weaning yourself off these medications. 2. Ask your pharmacy if the medication is in stock, 3 days prior to your refill. 3. If you need a pharmacy change, let us know at your medication management visit. Prescriptions that have already been electronically sent to a pharmacy will not be re-sent to a different pharmacy if your pharmacy of record does not have it in stock. Proper stocking of medication is a pharmacy problem, not a prescriber problem. Work with your pharmacist to solve the problem. 4. Have the [facility] Assembly add a provision to the "STOP ACT" (the law that mandates how controlled substances are prescribed) where there is an exception to the electronic prescribing rule that states that in the event there are shortages of medications the physicians are allowed to use written prescriptions as opposed to electronic ones. This would allow patients to take their prescriptions to a different pharmacy that may have enough medication available to fill the prescription. The problem is that currently there is a law that does not allow for written prescriptions, with the exception of instances where the electronic medical record is down due to technical issues.  5. Have [place] Congress ease the pressure on pharmaceutical companies, allowing them to produce enough quantities of the medication to adequately supply the population. 6. Have pharmacies keep enough  stocks of these medications to cover their client base.  7. Have the [facility] Assembly add  a provision to the "STOP ACT" where they ease the regulations surrounding the transfer of controlled substances between pharmacies, so as to simplify the transfer of supplies. As an alternative, develop a system to allow patients to obtain the remainder of their prescription at another one of their pharmacies or at an associate pharmacy.   How this shortage will affect you.  Understand that this is a pharmacy supply problem, not a prescriber problem. Work with your pharmacy to solve it. The job of the prescriber is to evaluate and monitor the patient for the appropriate indications and use of these medicines. It is not the job of the prescriber to supply the medication or to solve problems with that supply. The responsibility and the choice to obtain the medication resides on the patient. By law, supplying the medication is the job of the pharmacy. It is certainly not the job of the prescriber to solve supply problems.   Due to the above problems we are no longer taking patients to write for their pain medication. Future discussions with your physician may include potentially weaning medications or transitioning to alternatives.  We will be focusing primarily on interventional based pain management. We will continue to evaluate for appropriate indications and we may provide recommendations regarding medication, dose, and schedule, as well as monitoring recommendations, however, we will not be taking over the actual prescribing of these substances. On those patients where we are treating their chronic pain with interventional therapies, exceptions will be considered on a case by case basis. At this time, we will try to continue providing this supplemental service to those patients we have been managing in the past. However, as of August 1st, 2023, we no longer will be sending additional prescriptions to other pharmacies for the purpose of solving their supply problems. Once we send a prescription to a pharmacy,  we will not be resending it again to another pharmacy to cover for their shortages.   What to do. Write as many letters as you can. Recruit the help of family members in writing these letters. Below are some of the places where you can write to make your voice heard. Let them know what the problem is and push them to look for solutions.   Search internet for: "[employer] find your legislators" NoseSwap.is  Search internet for: "[employer] commissioner complaints" Starlas.fi  Search internet for: "[place] complaints" https://www.hernandez-brewer.com/.htm  Search internet for: "[facility] pharmacy complaints" Email [facility] Customer Relations woondaal.com.jsp?callType=store  Search internet for: Programme researcher, broadcasting/film/video customer service complaints" https://www.[facility].com/topic/marketing/contactus/contactus_customerservice.jsp  ____________________________________________________________________________________________  ____________________________________________________________________________________________  Medication Rules  Purpose: To inform patients, and their family members, of our rules and regulations.  Applies to: All patients receiving prescriptions (written or electronic).  Pharmacy of record: Pharmacy where electronic prescriptions will be sent. If written prescriptions are taken to a different pharmacy, please inform the nursing staff. The pharmacy listed in the electronic medical record should be the one where you would like electronic prescriptions to be sent.  Electronic prescriptions: In compliance with the [place] (STOP) Act of 2017 (Session Lanny Cramp [phone]), effective August 31, 2018, all controlled substances must be electronically prescribed. Calling prescriptions  to the pharmacy will cease to exist.  Prescription refills: Only during scheduled appointments. Applies to all prescriptions.  NOTE: The following applies primarily to controlled substances (Opioid* Pain Medications).  Type of encounter (visit): For patients receiving controlled substances, face-to-face visits are required. (Not an option or up to the patient.)  Patient's responsibilities: Pain Pills: Bring all pain pills to every appointment (except for procedure appointments). Pill Bottles: Bring pills in original pharmacy bottle. Always bring the newest bottle. Bring bottle, even if empty. Medication refills: You are responsible for knowing and keeping track of what medications you take and those you need refilled. The day before your appointment: write a list of all prescriptions that need to be refilled. The day of the appointment: give the list to the admitting nurse. Prescriptions will be written only during appointments. No prescriptions will be written on procedure days. If you forget a medication: it will not be "Called in", "Faxed", or "electronically sent". You will need to get another appointment to get these prescribed. No early refills. Do not call asking to have your prescription filled early. Prescription Accuracy: You are responsible for carefully inspecting your prescriptions before leaving our office. Have the discharge nurse carefully go over each prescription with you, before taking them home. Make sure that your name is accurately spelled, that your address is correct. Check the name and dose of your medication to make sure it is accurate. Check the number of pills, and the written instructions to make sure they are clear and accurate. Make sure that you are given enough medication to last until your next medication refill appointment. Taking Medication: Take medication as prescribed. When it comes to controlled substances, taking less pills or less frequently than prescribed  is permitted and encouraged. Never take more pills than instructed. Never take medication more frequently than prescribed.  Inform other Doctors: Always inform, all of your healthcare providers, of all the medications you take. Pain Medication from other Providers: You are not allowed to accept any additional pain medication from any other Doctor or Healthcare provider. There are two exceptions to this rule. (see below) In the event that you require additional pain medication, you are responsible for notifying us, as stated below. Cough Medicine: Often these contain an opioid, such as codeine or hydrocodone. Never accept or take cough medicine containing these opioids if you are already taking an opioid* medication. The combination may cause respiratory failure and death. Medication Agreement: You are responsible for carefully reading and following our Medication Agreement. This must be signed before receiving any prescriptions from our practice. Safely store a copy of your signed Agreement. Violations to the Agreement will result in no further prescriptions. (Additional copies of our Medication Agreement are available upon request.) Laws, Rules, & Regulations: All patients are expected to follow all Federal and [employer], [employer], Rules, [employer]. Ignorance of the Laws does not constitute a valid excuse.  Illegal drugs and Controlled Substances: The use of illegal substances (including, but not limited to marijuana and its derivatives) and/or the illegal use of any controlled substances is strictly prohibited. Violation of this rule may result in the immediate and permanent discontinuation of any and all prescriptions being written by our practice. The use of any illegal substances is prohibited. Adopted CDC guidelines & recommendations: Target dosing levels will be at or below 60 MME/day. Use of benzodiazepines** is not recommended.  Exceptions: There are only two exceptions to the rule of not  receiving pain medications from other Healthcare Providers. Exception #1 (Emergencies): In the event of an emergency (i.e.: accident requiring emergency care), you are allowed to receive additional pain medication. However, you are responsible for: As soon as  you are able, call our office [phone], at any time of the day or night, and leave a message stating your name, the date and nature of the emergency, and the name and dose of the medication prescribed. In the event that your call is answered by a member of our staff, make sure to document and save the date, time, and the name of the person that took your information.  Exception #2 (Planned Surgery): In the event that you are scheduled by another doctor or dentist to have any type of surgery or procedure, you are allowed (for a period no longer than 30 days), to receive additional pain medication, for the acute post-op pain. However, in this case, you are responsible for picking up a copy of our "Post-op Pain Management for Surgeons" handout, and giving it to your surgeon or dentist. This document is available at our office, and does not require an appointment to obtain it. Simply go to our office during business hours (Monday-Thursday from 8:00 AM to 4:00 PM) (Friday 8:00 AM to 12:00 Noon) or if you have a scheduled appointment with [place], prior to your surgery, and ask for it by name. In addition, you are responsible for: calling our office [phone], at any time of the day or night, and leaving a message stating your name, name of your surgeon, type of surgery, and date of procedure or surgery. Failure to comply with your responsibilities may result in termination of therapy involving the controlled substances. Medication Agreement Violation. Following the above rules, including your responsibilities will help you in avoiding a Medication Agreement Violation ("Breaking your Pain Medication Contract").  *Opioid medications include: morphine,  codeine, oxycodone, oxymorphone, hydrocodone, hydromorphone, meperidine, tramadol, tapentadol, buprenorphine, fentanyl, methadone. **Benzodiazepine medications include: diazepam (Valium), alprazolam (Xanax), clonazepam (Klonopine), lorazepam (Ativan), clorazepate (Tranxene), chlordiazepoxide (Librium), estazolam (Prosom), oxazepam (Serax), temazepam (Restoril), triazolam (Halcion) (Last updated: 05/28/2021) ____________________________________________________________________________________________  ____________________________________________________________________________________________  Medication Recommendations and Reminders  Applies to: All patients receiving prescriptions (written and/or electronic).  Medication Rules & Regulations: These rules and regulations exist for your safety and that of others. They are not flexible and neither are we. Dismissing or ignoring them will be considered "non-compliance" with medication therapy, resulting in complete and irreversible termination of such therapy. (See document titled "Medication Rules" for more details.) In all conscience, because of safety reasons, we cannot continue providing a therapy where the patient does not follow instructions.  Pharmacy of record:  Definition: This is the pharmacy where your electronic prescriptions will be sent.  We do not endorse any particular pharmacy, however, we have experienced problems with [facility] not securing enough medication supply for the community. We do not restrict you in your choice of pharmacy. However, once we write for your prescriptions, we will NOT be re-sending more prescriptions to fix restricted supply problems created by your pharmacy, or your insurance.  The pharmacy listed in the electronic medical record should be the one where you want electronic prescriptions to be sent. If you choose to change pharmacy, simply notify our nursing staff.  Recommendations: Keep all of your pain  medications in a safe place, under lock and key, even if you live alone. We will NOT replace lost, stolen, or damaged medication. After you fill your prescription, take 1 week's worth of pills and put them away in a safe place. You should keep a separate, properly labeled bottle for this purpose. The remainder should be kept in the original bottle. Use this as your primary supply, until  it runs out. Once it's gone, then you know that you have 1 week's worth of medicine, and it is time to come in for a prescription refill. If you do this correctly, it is unlikely that you will ever run out of medicine. To make sure that the above recommendation works, it is very important that you make sure your medication refill appointments are scheduled at least 1 week before you run out of medicine. To do this in an effective manner, make sure that you do not leave the office without scheduling your next medication management appointment. Always ask the nursing staff to show you in your prescription , when your medication will be running out. Then arrange for the receptionist to get you a return appointment, at least 7 days before you run out of medicine. Do not wait until you have 1 or 2 pills left, to come in. This is very poor planning and does not take into consideration that we may need to cancel appointments due to bad weather, sickness, or emergencies affecting our staff. DO NOT ACCEPT A "Partial Fill": If for any reason your pharmacy does not have enough pills/tablets to completely fill or refill your prescription, do not allow for a "partial fill". The law allows the pharmacy to complete that prescription within 72 hours, without requiring a new prescription. If they do not fill the rest of your prescription within those 72 hours, you will need a separate prescription to fill the remaining amount, which we will NOT provide. If the reason for the partial fill is your insurance, you will need to talk to the pharmacist  about payment alternatives for the remaining tablets, but again, DO NOT ACCEPT A PARTIAL FILL, unless you can trust your pharmacist to obtain the remainder of the pills within 72 hours.  Prescription refills and/or changes in medication(s):  Prescription refills, and/or changes in dose or medication, will be conducted only during scheduled medication management appointments. (Applies to both, written and electronic prescriptions.) No refills on procedure days. No medication will be changed or started on procedure days. No changes, adjustments, and/or refills will be conducted on a procedure day. Doing so will interfere with the diagnostic portion of the procedure. No phone refills. No medications will be "called into the pharmacy". No Fax refills. No weekend refills. No Holliday refills. No after hours refills.  Remember:  Business hours are:  Monday to Thursday 8:00 AM to 4:00 PM Provider's Schedule: Milinda Pointer, MD - Appointments are:  Medication management: Monday and Wednesday 8:00 AM to 4:00 PM Procedure day: Tuesday and Thursday 7:30 AM to 4:00 PM Gillis Santa, MD - Appointments are:  Medication management: Tuesday and Thursday 8:00 AM to 4:00 PM Procedure day: Monday and Wednesday 7:30 AM to 4:00 PM (Last update: 03/20/2020) ____________________________________________________________________________________________  ____________________________________________________________________________________________  CBD (cannabidiol) & Delta-8 (Delta-8 tetrahydrocannabinol) WARNING  Intro: Cannabidiol (CBD) and tetrahydrocannabinol (THC), are two natural compounds found in plants of the Cannabis genus. They can both be extracted from hemp or cannabis. Hemp and cannabis come from the Cannabis sativa plant. Both compounds interact with your body's endocannabinoid system, but they have very different effects. CBD does not produce the high sensation associated with cannabis. Delta-8  tetrahydrocannabinol, also known as delta-8 THC, is a psychoactive substance found in the Cannabis sativa plant, of which marijuana and hemp are two varieties. THC is responsible for the high associated with the illicit use of marijuana.  Applicable to: All individuals currently taking or considering taking CBD (cannabidiol) and,  more important, all patients taking opioid analgesic controlled substances (pain medication). (Example: oxycodone; oxymorphone; hydrocodone; hydromorphone; morphine; methadone; tramadol; tapentadol; fentanyl; buprenorphine; butorphanol; dextromethorphan; meperidine; codeine; etc.)  Legal status: CBD remains a Schedule I drug prohibited for any use. CBD is illegal with one exception. In the [place], CBD has a limited Transport planner (FDA) approval for the treatment of two specific types of epilepsy disorders. Only one CBD product has been approved by the FDA for this purpose: "Epidiolex". FDA is aware that some companies are marketing products containing cannabis and cannabis-derived compounds in ways that violate the [employer], Drug and Cosmetic Act [facility] Act) and that may put the health and safety of consumers at risk. The FDA, a Federal agency, has not enforced the CBD status since 2018. UPDATE: (10/17/2021) The Drug Enforcement Agency ([place]) issued a letter stating that "delta" cannabinoids, including Delta-8-THCO and Delta-9-THCO, synthetically derived from hemp do not qualify as hemp and will be viewed as Schedule I drugs. (Schedule I drugs, substances, or chemicals are defined as drugs with no currently accepted medical use and a high potential for abuse. Some examples of Schedule I drugs are: heroin, lysergic acid diethylamide (LSD), marijuana (cannabis), 3,4-methylenedioxymethamphetamine (ecstasy), methaqualone, and peyote.) (https://jennings.com/)  Legality: Some manufacturers ship CBD products nationally, which is illegal. Often such products are sold  online and are therefore available throughout the country. CBD is openly sold in head shops and health food stores in some states where such sales have not been explicitly legalized. Selling unapproved products with unsubstantiated therapeutic claims is not only a violation of the law, but also can put patients at risk, as these products have not been proven to be safe or effective. Federal illegality makes it difficult to conduct research on CBD.  Reference: "FDA Regulation of Cannabis and Cannabis-Derived Products, Including Cannabidiol (CBD)" - SeekArtists.com.pt  Warning: CBD is not FDA approved and has not undergo the same manufacturing controls as prescription drugs.  This means that the purity and safety of available CBD may be questionable. Most of the time, despite manufacturer's claims, it is contaminated with THC (delta-9-tetrahydrocannabinol - the chemical in marijuana responsible for the "HIGH").  When this is the case, the [facility] contaminant will trigger a positive urine drug screen (UDS) test for Marijuana (carboxy-THC). Because a positive UDS for any illicit substance is a violation of our medication agreement, your opioid analgesics (pain medicine) may be permanently discontinued. The FDA recently put out a warning about 5 things that everyone should be aware of regarding Delta-8 THC: Delta-8 THC products have not been evaluated or approved by the FDA for safe use and may be marketed in ways that put the public health at risk. The FDA has received adverse event reports involving delta-8 THC-containing products. Delta-8 THC has psychoactive and intoxicating effects. Delta-8 THC manufacturing often involve use of potentially harmful chemicals to create the concentrations of delta-8 THC claimed in the marketplace. The final delta-8 THC product may have potentially harmful by-products  (contaminants) due to the chemicals used in the process. Manufacturing of delta-8 THC products may occur in uncontrolled or unsanitary settings, which may lead to the presence of unsafe contaminants or other potentially harmful substances. Delta-8 THC products should be kept out of the reach of children and pets.  MORE ABOUT CBD  General Information: CBD was discovered in [birth year] and it is a derivative of the cannabis sativa genus plants (Marijuana and Hemp). It is one of the 113 identified substances found in Marijuana.  It accounts for up to 40% of the plant's extract. As of 2018, preliminary clinical studies on CBD included research for the treatment of anxiety, movement disorders, and pain. CBD is available and consumed in multiple forms, including inhalation of smoke or vapor, as an aerosol spray, and by mouth. It may be supplied as an oil containing CBD, capsules, dried cannabis, or as a liquid solution. CBD is thought not to be as psychoactive as THC (delta-9-tetrahydrocannabinol - the chemical in marijuana responsible for the "HIGH"). Studies suggest that CBD may interact with different biological target receptors in the body, including cannabinoid and other neurotransmitter receptors. As of 2018 the mechanism of action for its biological effects has not been determined.  Side-effects  Adverse reactions: Dry mouth, diarrhea, decreased appetite, fatigue, drowsiness, malaise, weakness, sleep disturbances, and others.  Drug interactions: CBC may interact with other medications such as blood-thinners. Because CBD causes drowsiness on its own, it also increases the drowsiness caused by other medications, including antihistamines (such as Benadryl), benzodiazepines (Xanax, Ativan, Valium), antipsychotics, antidepressants and opioids, as well as alcohol and supplements such as kava, melatonin and St. John's Wort. Be cautious with the following combinations:   Brivaracetam (Briviact) Brivaracetam is changed  and broken down by the body. CBD might decrease how quickly the body breaks down brivaracetam. This might increase levels of brivaracetam in the body.  Caffeine Caffeine is changed and broken down by the body. CBD might decrease how quickly the body breaks down caffeine. This might increase levels of caffeine in the body.  Carbamazepine (Tegretol) Carbamazepine is changed and broken down by the body. CBD might decrease how quickly the body breaks down carbamazepine. This might increase levels of carbamazepine in the body and increase its side effects.  Citalopram (Celexa) Citalopram is changed and broken down by the body. CBD might decrease how quickly the body breaks down citalopram. This might increase levels of citalopram in the body and increase its side effects.  Clobazam (Onfi) Clobazam is changed and broken down by the liver. CBD might decrease how quickly the liver breaks down clobazam. This might increase the effects and side effects of clobazam.  Eslicarbazepine (Aptiom) Eslicarbazepine is changed and broken down by the body. CBD might decrease how quickly the body breaks down eslicarbazepine. This might increase levels of eslicarbazepine in the body by a small amount.  Everolimus (Zostress) Everolimus is changed and broken down by the body. CBD might decrease how quickly the body breaks down everolimus. This might increase levels of everolimus in the body.  Lithium Taking higher doses of CBD might increase levels of lithium. This can increase the risk of lithium toxicity.  Medications changed by the liver (Cytochrome P450 1A1 (CYP1A1) substrates) Some medications are changed and broken down by the liver. CBD might change how quickly the liver breaks down these medications. This could change the effects and side effects of these medications.  Medications changed by the liver (Cytochrome P450 1A2 (CYP1A2) substrates) Some medications are changed and broken down by the liver. CBD  might change how quickly the liver breaks down these medications. This could change the effects and side effects of these medications.  Medications changed by the liver (Cytochrome P450 1B1 (CYP1B1) substrates) Some medications are changed and broken down by the liver. CBD might change how quickly the liver breaks down these medications. This could change the effects and side effects of these medications.  Medications changed by the liver (Cytochrome P450 2A6 (CYP2A6) substrates) Some medications are changed and  broken down by the liver. CBD might change how quickly the liver breaks down these medications. This could change the effects and side effects of these medications.  Medications changed by the liver (Cytochrome P450 2B6 (CYP2B6) substrates) Some medications are changed and broken down by the liver. CBD might change how quickly the liver breaks down these medications. This could change the effects and side effects of these medications.  Medications changed by the liver (Cytochrome P450 2C19 (CYP2C19) substrates) Some medications are changed and broken down by the liver. CBD might change how quickly the liver breaks down these medications. This could change the effects and side effects of these medications.  Medications changed by the liver (Cytochrome P450 2C8 (CYP2C8) substrates) Some medications are changed and broken down by the liver. CBD might change how quickly the liver breaks down these medications. This could change the effects and side effects of these medications.  Medications changed by the liver (Cytochrome P450 2C9 (CYP2C9) substrates) Some medications are changed and broken down by the liver. CBD might change how quickly the liver breaks down these medications. This could change the effects and side effects of these medications.  Medications changed by the liver (Cytochrome P450 2D6 (CYP2D6) substrates) Some medications are changed and broken down by the liver. CBD might  change how quickly the liver breaks down these medications. This could change the effects and side effects of these medications.  Medications changed by the liver (Cytochrome P450 2E1 (CYP2E1) substrates) Some medications are changed and broken down by the liver. CBD might change how quickly the liver breaks down these medications. This could change the effects and side effects of these medications.  Medications changed by the liver (Cytochrome P450 3A4 (CYP3A4) substrates) Some medications are changed and broken down by the liver. CBD might change how quickly the liver breaks down these medications. This could change the effects and side effects of these medications.  Medications changed by the liver (Glucuronidated drugs) Some medications are changed and broken down by the liver. CBD might change how quickly the liver breaks down these medications. This could change the effects and side effects of these medications.  Medications that decrease the breakdown of other medications by the liver (Cytochrome P450 2C19 (CYP2C19) inhibitors) CBD is changed and broken down by the liver. Some drugs decrease how quickly the liver changes and breaks down CBD. This could change the effects and side effects of CBD.  Medications that decrease the breakdown of other medications in the liver (Cytochrome P450 3A4 (CYP3A4) inhibitors) CBD is changed and broken down by the liver. Some drugs decrease how quickly the liver changes and breaks down CBD. This could change the effects and side effects of CBD.  Medications that increase breakdown of other medications by the liver (Cytochrome P450 3A4 (CYP3A4) inducers) CBD is changed and broken down by the liver. Some drugs increase how quickly the liver changes and breaks down CBD. This could change the effects and side effects of CBD.  Medications that increase the breakdown of other medications by the liver (Cytochrome P450 2C19 (CYP2C19) inducers) CBD is changed and  broken down by the liver. Some drugs increase how quickly the liver changes and breaks down CBD. This could change the effects and side effects of CBD.  Methadone (Dolophine) Methadone is broken down by the liver. CBD might decrease how quickly the liver breaks down methadone. Taking cannabidiol along with methadone might increase the effects and side effects of methadone.  Rufinamide (Banzel) Rufinamide is  changed and broken down by the body. CBD might decrease how quickly the body breaks down rufinamide. This might increase levels of rufinamide in the body by a small amount.  Sedative medications (CNS depressants) CBD might cause sleepiness and slowed breathing. Some medications, called sedatives, can also cause sleepiness and slowed breathing. Taking CBD with sedative medications might cause breathing problems and/or too much sleepiness.  Sirolimus (Rapamune) Sirolimus is changed and broken down by the body. CBD might decrease how quickly the body breaks down sirolimus. This might increase levels of sirolimus in the body.  Stiripentol (Diacomit) Stiripentol is changed and broken down by the body. CBD might decrease how quickly the body breaks down stiripentol. This might increase levels of stiripentol in the body and increase its side effects.  Tacrolimus (Prograf) Tacrolimus is changed and broken down by the body. CBD might decrease how quickly the body breaks down tacrolimus. This might increase levels of tacrolimus in the body.  Tamoxifen (Soltamox) Tamoxifen is changed and broken down by the body. CBD might affect how quickly the body breaks down tamoxifen. This might affect levels of tamoxifen in the body.  Topiramate (Topamax) Topiramate is changed and broken down by the body. CBD might decrease how quickly the body breaks down topiramate. This might increase levels of topiramate in the body by a small amount.  Valproate Valproic acid can cause liver injury. Taking cannabidiol  with valproic acid might increase the chance of liver injury. CBD and/or valproic acid might need to be stopped, or the dose might need to be reduced.  Warfarin (Coumadin) CBD might increase levels of warfarin, which can increase the risk for bleeding. CBD and/or warfarin might need to be stopped, or the dose might need to be reduced.  Zonisamide Zonisamide is changed and broken down by the body. CBD might decrease how quickly the body breaks down zonisamide. This might increase levels of zonisamide in the body by a small amount. (Last update: 10/29/2021) ____________________________________________________________________________________________  ____________________________________________________________________________________________  Drug Holidays (Slow)  What is a "Drug Holiday"? Drug Holiday: is the name given to the period of time during which a patient stops taking a medication(s) for the purpose of eliminating tolerance to the drug.  Benefits Improved effectiveness of opioids. Decreased opioid dose needed to achieve benefits. Improved pain with lesser dose.  What is tolerance? Tolerance: is the progressive decreased in effectiveness of a drug due to its repetitive use. With repetitive use, the body gets use to the medication and as a consequence, it loses its effectiveness. This is a common problem seen with opioid pain medications. As a result, a larger dose of the drug is needed to achieve the same effect that used to be obtained with a smaller dose.  How long should a "Drug Holiday" last? You should stay off of the pain medicine for at least 14 consecutive days. (2 weeks)  Should I stop the medicine "cold [place]"? No. You should always coordinate with your Pain Specialist so that he/she can provide you with the correct medication dose to make the transition as smoothly as possible.  How do I stop the medicine? Slowly. You will be instructed to decrease the daily amount of  pills that you take by one (1) pill every seven (7) days. This is called a "slow downward taper" of your dose. For example: if you normally take four (4) pills per day, you will be asked to drop this dose to three (3) pills per day for seven (7) days, then to  two (2) pills per day for seven (7) days, then to one (1) per day for seven (7) days, and at the end of those last seven (7) days, this is when the "Drug Holiday" would start.   Will I have withdrawals? By doing a "slow downward taper" like this one, it is unlikely that you will experience any significant withdrawal symptoms. Typically, what triggers withdrawals is the sudden stop of a high dose opioid therapy. Withdrawals can usually be avoided by slowly decreasing the dose over a prolonged period of time. If you do not follow these instructions and decide to stop your medication abruptly, withdrawals may be possible.  What are withdrawals? Withdrawals: refers to the wide range of symptoms that occur after stopping or dramatically reducing opiate drugs after heavy and prolonged use. Withdrawal symptoms do not occur to patients that use low dose opioids, or those who take the medication sporadically. Contrary to benzodiazepine (example: Valium, Xanax, etc.) or alcohol withdrawals ("Delirium Tremens"), opioid withdrawals are not lethal. Withdrawals are the physical manifestation of the body getting rid of the excess receptors.  Expected Symptoms Early symptoms of withdrawal may include: Agitation Anxiety Muscle aches Increased tearing Insomnia Runny nose Sweating Yawning  Late symptoms of withdrawal may include: Abdominal cramping Diarrhea Dilated pupils Goose bumps Nausea Vomiting  Will I experience withdrawals? Due to the slow nature of the taper, it is very unlikely that you will experience any.  What is a slow taper? Taper: refers to the gradual decrease in dose.  (Last update:  03/20/2020) ____________________________________________________________________________________________   Opioid Overdose Opioids are drugs that are often used to treat pain. Opioids include illegal drugs, such as heroin, as well as prescription pain medicines, such as codeine, morphine, hydrocodone, and fentanyl. An opioid overdose happens when you take too much of an opioid. An overdose may be intentional or accidental and can happen with any type of opioid. The effects of an overdose can be mild, dangerous, or even deadly. Opioid overdose is a medical emergency. What are the causes? This condition may be caused by: Taking too much of an opioid on purpose. Taking too much of an opioid by accident. Using two or more substances that contain opioids at the same time. Taking an opioid with a substance that affects your heart, breathing, or blood pressure. These include alcohol, tranquilizers, sleeping pills, illegal drugs, and some over-the-counter medicines. This condition may also happen due to an error made by: A health care provider who prescribes a medicine. The pharmacist who fills the prescription. What increases the risk? This condition is more likely in: Children. They may be attracted to colorful pills. Because of a child's small size, even a small amount of a medicine can be dangerous. Older people. They may be taking many different medicines. Older people may have difficulty reading labels or remembering when they last took their medicines. They may also be more sensitive to the effects of opioids. People with chronic medical conditions, especially heart, liver, kidney, or neurological diseases. People who take an opioid for a long period of time. People who take opioids and use illegal drugs, such as heroin, or other substances, such as alcohol. People who: Have a history of drug or alcohol abuse. Have certain mental health conditions. Have a history of previous drug  overdoses. People who take opioids that are not prescribed for them. What are the signs or symptoms? Symptoms of this condition depend on the type of opioid and the amount that was taken. Common symptoms include:  Sleepiness or difficulty waking from sleep. Confusion. Slurred speech. Slowed breathing and a slow pulse (bradycardia). Nausea and vomiting. Abnormally small pupils. Signs and symptoms that require emergency treatment include: Cold, clammy, and pale skin. Blue lips and fingernails. Vomiting. Gurgling sounds in the throat. A pulse that is very slow or difficult to detect. Breathing that is very irregular, slow, noisy, or difficult to detect. Inability to respond to speech or be awakened from sleep (stupor). Seizures. How is this diagnosed? This condition is diagnosed based on your symptoms and medical history. It is important to tell your health care provider: About all of the opioids that you took. When you took the opioids. Whether you were drinking alcohol or using marijuana, cocaine, or other drugs. Your health care provider will do a physical exam. This exam may include: Checking and monitoring your heart rate and rhythm, breathing rate, temperature, and blood pressure. Measuring oxygen levels in your blood. Checking for abnormally small pupils. You may also have blood tests or urine tests. You may have X-rays if you are having severe breathing problems. How is this treated? This condition requires immediate medical treatment and hospitalization. Reversing the effects of the opioid is the first step in treatment. If you have a Narcan kit or naloxone, use it right away. Follow your health care provider's instructions. A friend or family member can also help you with this. The rest of your treatment will be given in the hospital intensive care (ICU). Treatment in the hospital may include: Giving salts and minerals (electrolytes) along with fluids through an IV. Inserting a  breathing tube (endotracheal tube) in your airway to help you breathe if you cannot breathe on your own or you are in danger of not being able to breathe on your own. Giving oxygen through a small tube under your nose. Passing a tube through your nose and into your stomach (nasogastric tube, or NG tube) to empty your stomach. Giving medicines that: Increase your blood pressure. Relieve nausea and vomiting. Relieve abdominal pain and cramping. Reverse the effects of the opioid (naloxone). Monitoring your heart and oxygen levels. Ongoing counseling and mental health support if you intentionally overdosed or used an illegal drug. Follow these instructions at home:  Medicines Take over-the-counter and prescription medicines only as told by your health care provider. Always ask your health care provider about possible side effects and interactions of any new medicine that you start taking. Keep a list of all the medicines that you take, including over-the-counter medicines. Bring this list with you to all your medical visits. General instructions Drink enough fluid to keep your urine pale yellow. Keep all follow-up visits. This is important. How is this prevented? Read the drug inserts that come with your opioid pain medicines. Take medicines only as told by your health care provider. Do not take more medicine than you are told. Do not take medicines more frequently than you are told. Do not drink alcohol or take sedatives when taking opioids. Do not use illegal or recreational drugs, including cocaine, ecstasy, and marijuana. Do not take opioid medicines that are not prescribed for you. Store all medicines in safety containers that are out of the reach of children. Get help if you are struggling with: Alcohol or drug use. Depression or another mental health problem. Thoughts of hurting yourself or another person. Keep the phone number of your local poison control center near your phone or  in your mobile phone. In the [place] the hotline of the [employer]  Control Center is (800) 334-284-0539. If you were prescribed naloxone, make sure you understand how to take it. Contact a health care provider if: You need help understanding how to take your pain medicines. You feel your medicines are too strong. You are concerned that your pain medicines are not working well for your pain. You develop new symptoms or side effects when you are taking medicines. Get help right away if: You or someone else is having symptoms of an opioid overdose. Get help even if you are not sure. You have thoughts about hurting yourself or others. You have: Chest pain. Difficulty breathing. A loss of consciousness. These symptoms may represent a serious problem that is an emergency. Do not wait to see if the symptoms will go away. Get medical help right away. Call your local emergency services (911 in the [place] Do not drive yourself to the hospital. If you ever feel like you may hurt yourself or others, or have thoughts about taking your own life, get help right away. You can go to your nearest emergency department or: Call your local emergency services (911 in the [place] Call a suicide crisis helpline, such as the [facility] at 858-008-8286 or 988 in the [place]. This is open 24 hours a day in the [place] Text the Crisis Text Line at 612-808-2294 (in the [place].). Summary Opioids are drugs that are often used to treat pain. Opioids include illegal drugs, such as heroin, as well as prescription pain medicines. An opioid overdose happens when you take too much of an opioid. Overdoses can be intentional or accidental. Opioid overdose is very dangerous. It is a life-threatening emergency. If you or someone you know is experiencing an opioid overdose, get help right away. This information is not intended to replace advice given to you by your health care provider. Make sure you discuss any questions  you have with your health care provider. Document Revised: 03/12/2021 Document Reviewed: 11/27/2020 Else[employer] Patient Education  [employer].

## 2022-05-15 ENCOUNTER — Telehealth: Payer: Self-pay

## 2022-05-15 NOTE — Telephone Encounter (Signed)
Called post IT pump refill. Denies any needs. Instructed to call if needed.

## 2022-06-10 ENCOUNTER — Ambulatory Visit: Payer: Medicare HMO | Attending: Cardiology | Admitting: Cardiology

## 2022-06-10 ENCOUNTER — Encounter: Payer: Self-pay | Admitting: Cardiology

## 2022-06-10 VITALS — BP 122/86 | Ht 63.0 in | Wt 181.4 lb

## 2022-06-10 DIAGNOSIS — I10 Essential (primary) hypertension: Secondary | ICD-10-CM

## 2022-06-10 DIAGNOSIS — I480 Paroxysmal atrial fibrillation: Secondary | ICD-10-CM

## 2022-06-10 NOTE — Progress Notes (Signed)
Electrophysiology Office Note   Date:  06/10/2022   ID:  Kendra Nelson, DOB Sep 11, 1960, MRN [id number]  PCP:  Berkley Harvey, NP  Cardiologist:  Margaretann Loveless Primary Electrophysiologist:  Elner Seifert Meredith Leeds, MD   Chief Complaint: AF   History of Present Illness: Kendra Nelson is a 61 y.o. female who is being seen today for the evaluation of AF at the request of Berkley Harvey, NP. Presenting today for electrophysiology evaluation.  She has a history significant for hypertension, hyperlipidemia, chronic back pain post spinal cord stimulator, atrial fibrillation.  EKG after surgery showed atrial fibrillation and she was started on amiodarone.  She is post ablation 06/13/2021.  She has an elevated PVC burden at 18%, though she is asymptomatic and her ejection fraction is remained normal.  Today, denies symptoms of palpitations, chest pain, shortness of breath, orthopnea, PND, lower extremity edema, claudication, dizziness, presyncope, syncope, bleeding, or neurologic sequela. The patient is tolerating medications without difficulties.  She is currently feeling well.  She has noted no further episodes of atrial fibrillation.  She has had 3 episodes of chest discomfort.  It is a sharp pain in her chest that is rapid on and off.  It occurs when she is at rest.  Fortunately her coronary calcium score is 0 and she had a negative Myoview 1 year ago.   Past Medical History:  Diagnosis Date   Anxiety    off of valium- stress   Arthritis    left knee- rhumatoid- hasnt seen specialist    Chronic lower back pain    Complication of anesthesia    WOKE DURING BACK SURGERY ; urinary retention 12/20/2017   Hyperlipidemia    Hypertension    Migraine    "when seasons change" (12/20/2017)   Neuromuscular disorder (HCC)    Restless leg syndrome    Seasonal allergies    Skin abnormalities    Wears glasses    Past Surgical History:  Procedure Laterality Date   ATRIAL FIBRILLATION ABLATION N/A  06/13/2021   Procedure: ATRIAL FIBRILLATION ABLATION;  Surgeon: Constance Haw, MD;  Location: [facility];  Service: Cardiovascular;  Laterality: N/A;   BACK SURGERY     8-9 SURGERIES; lower back   COLONOSCOPY     INTRATHECAL PUMP REVISION N/A 05/09/2019   Procedure: Intrathecal pump change;  Surgeon: Clydell Hakim, MD;  Location: [facility];  Service: Neurosurgery;  Laterality: N/A;  Intrathecal pump change   JOINT REPLACEMENT     PAIN PUMP IMPLANTATION  05/10/2012   Procedure: PAIN PUMP INSERTION;  Surgeon: Erline Levine, MD;  Location: [facility];  Service: Neurosurgery;  Laterality: N/A;  Pump replacement   SPINAL CORD STIMULATOR BATTERY EXCHANGE N/A 06/09/2016   Procedure: IMPLANTABLE PULSE GENERATOR REPLACEMENT WITH RECHARGEABLE BATTERY;  Surgeon: Erline Levine, MD;  Location: [facility];  Service: Neurosurgery;  Laterality: N/A;   TOTAL KNEE ARTHROPLASTY Left 12/20/2017   TOTAL KNEE ARTHROPLASTY Left 12/20/2017   Procedure: TOTAL KNEE ARTHROPLASTY;  Surgeon: Frederik Pear, MD;  Location: [facility];  Service: Orthopedics;  Laterality: Left;   TOTAL KNEE REVISION Left 12/19/2020   Procedure: TOTAL KNEE REVISION/ Total Knee Polynethylene Barring, Attune Total Knee;  Surgeon: Frederik Pear, MD;  Location: [facility];  Service: Orthopedics;  Laterality: Left;   VAGINAL HYSTERECTOMY       Current Outpatient Medications  Medication Sig Dispense Refill   AMBULATORY NON FORMULARY MEDICATION Medication Name:  Intrathecal pump Fentanyl 1,000.0 mcg/ml Bupivacaine 20.0 mg/ml 40  ml pump Rate 357.2 mcg/day     amLODipine (NORVASC) 5 MG tablet Take 5 mg by mouth.     atorvastatin (LIPITOR) 20 MG tablet Take 1 tablet (20 mg total) by mouth every morning. 90 tablet 3   carvedilol (COREG) 6.25 MG tablet Take 1 tablet (6.25 mg total) by mouth 2 (two) times daily. 180 tablet 3   furosemide (LASIX) 20 MG tablet Take 20 mg by mouth daily.     lisinopril (ZESTRIL) 20 MG tablet Take 2 tablets (40 mg total) by  mouth every morning. 90 tablet 3   morphine (MS CONTIN) 30 MG 12 hr tablet Take 1 tablet (30 mg total) by mouth every 12 (twelve) hours. Must last 30 days. Do not break tablet 60 tablet 0   [START ON 06/14/2022] morphine (MS CONTIN) 30 MG 12 hr tablet Take 1 tablet (30 mg total) by mouth every 12 (twelve) hours. Must last 30 days. Do not break tablet 60 tablet 0   [START ON 07/14/2022] morphine (MS CONTIN) 30 MG 12 hr tablet Take 1 tablet (30 mg total) by mouth every 12 (twelve) hours. Must last 30 days. Do not break tablet 60 tablet 0   No current facility-administered medications for this visit.    Allergies:   Benadryl [diphenhydramine hcl], Bee venom, and Dexamethasone   Social History:  The patient  reports that she quit smoking about 3 years ago. Her smoking use included cigarettes. She has a 30.00 pack-year smoking history. She has never used smokeless tobacco. She reports current drug use. Drug: Oxycodone. She reports that she does not drink alcohol.   Family History:  The patient's family history includes Aortic aneurysm in her mother; Diabetes in her maternal grandmother.   ROS:  Please see the history of present illness.   Otherwise, review of systems is positive for none.   All other systems are reviewed and negative.   PHYSICAL EXAM: VS:  BP 122/86   Ht 5\' 3"  (1.6 m)   Wt 181 lb 6.4 oz (82.3 kg)   SpO2 94%   BMI 32.13 kg/m  , BMI Body mass index is 32.13 kg/m. GEN: Well nourished, well developed, in no acute distress  HEENT: normal  Neck: no JVD, carotid bruits, or masses Cardiac: RRR; no murmurs, rubs, or gallops,no edema  Respiratory:  clear to auscultation bilaterally, normal work of breathing GI: soft, nontender, nondistended, + BS MS: no deformity or atrophy  Skin: warm and dry Neuro:  Strength and sensation are intact Psych: euthymic mood, full affect  EKG:  EKG is not ordered today. Personal review of the ekg ordered 03/25/22 shows sinus rhythm, rate  63  Recent Labs: No results found for requested labs within last 365 days.    Lipid Panel  No results found for: "CHOL", "TRIG", "HDL", "CHOLHDL", "VLDL", "LDLCALC", "LDLDIRECT"   Wt Readings from Last 3 Encounters:  06/10/22 181 lb 6.4 oz (82.3 kg)  05/14/22 173 lb (78.5 kg)  03/25/22 182 lb (82.6 kg)      Other studies Reviewed: Additional studies/ records that were reviewed today include: TTE 12/20/20  Review of the above records today demonstrates:   1. Left ventricular ejection fraction, by estimation, is 60 to 65%. The  left ventricle has normal function. The left ventricle has no regional  wall motion abnormalities. Left ventricular diastolic parameters are  consistent with Grade II diastolic  dysfunction (pseudonormalization).   2. Right ventricular systolic function is normal. The right ventricular  size is normal.  3. Left atrial size was moderately dilated.   4. The mitral valve is normal in structure. Trivial mitral valve  regurgitation. No evidence of mitral stenosis.   5. The aortic valve is normal in structure. Aortic valve regurgitation is  not visualized. No aortic stenosis is present.   6. The inferior vena cava is dilated in size with >50% respiratory  variability, suggesting right atrial pressure of 8 mmHg.   Myoview 01/17/21 The left ventricular ejection fraction is normal (55-65%). Nuclear stress EF: 55%. No T wave inversion was noted during stress. There was no ST segment deviation noted during stress. This is a low risk study.  ASSESSMENT AND PLAN:  1.  Paroxysmal atrial fibrillation/flutter: Noted on cardiac monitors.  Currently on carvedilol.  CHA2DS2-VASc of 2.  She wished to stop her Eliquis at the last visit.  Overall happy with her control.  2.  Hypertension: Currently well controlled  3.  Hyperlipidemia: Continue atorvastatin per primary cardiology.  4.  PVCs: 18% burden on cardiac monitor.  Ejection fraction is remained normal and she  is asymptomatic.  No changes.   Current medicines are reviewed at length with the patient today.   The patient does not have concerns regarding her medicines.  The following changes were made today: None  Labs/ tests ordered today include:  No orders of the defined types were placed in this encounter.    Disposition:   FU 12 months  Signed, Tysen Roesler Meredith Leeds, MD  06/10/2022 9:53 AM     [facility] [address] [place] [address] [phone] (office) [phone] (fax)

## 2022-07-18 ENCOUNTER — Other Ambulatory Visit: Payer: Self-pay | Admitting: Cardiology

## 2022-07-22 ENCOUNTER — Other Ambulatory Visit: Payer: Self-pay

## 2022-07-22 MED ORDER — PAIN MANAGEMENT IT PUMP REFILL: 1.0000 | Freq: Once | INTRATHECAL | 0 refills | Status: AC

## 2022-08-11 ENCOUNTER — Encounter: Payer: Self-pay | Admitting: Pain Medicine

## 2022-08-11 ENCOUNTER — Ambulatory Visit: Payer: Medicare HMO | Attending: Pain Medicine | Admitting: Pain Medicine

## 2022-08-11 VITALS — BP 174/104 | HR 71 | Temp 97.6°F | Resp 18 | Ht 63.0 in | Wt 173.0 lb

## 2022-08-11 DIAGNOSIS — Z978 Presence of other specified devices: Secondary | ICD-10-CM | POA: Insufficient documentation

## 2022-08-11 DIAGNOSIS — Z79891 Long term (current) use of opiate analgesic: Secondary | ICD-10-CM | POA: Insufficient documentation

## 2022-08-11 DIAGNOSIS — M5417 Radiculopathy, lumbosacral region: Secondary | ICD-10-CM | POA: Diagnosis present

## 2022-08-11 DIAGNOSIS — M79605 Pain in left leg: Secondary | ICD-10-CM | POA: Diagnosis present

## 2022-08-11 DIAGNOSIS — M79604 Pain in right leg: Secondary | ICD-10-CM | POA: Insufficient documentation

## 2022-08-11 DIAGNOSIS — Z451 Encounter for adjustment and management of infusion pump: Secondary | ICD-10-CM | POA: Diagnosis present

## 2022-08-11 DIAGNOSIS — Z79899 Other long term (current) drug therapy: Secondary | ICD-10-CM | POA: Diagnosis present

## 2022-08-11 DIAGNOSIS — G894 Chronic pain syndrome: Secondary | ICD-10-CM | POA: Insufficient documentation

## 2022-08-11 DIAGNOSIS — M5441 Lumbago with sciatica, right side: Secondary | ICD-10-CM | POA: Diagnosis present

## 2022-08-11 DIAGNOSIS — M47816 Spondylosis without myelopathy or radiculopathy, lumbar region: Secondary | ICD-10-CM | POA: Insufficient documentation

## 2022-08-11 DIAGNOSIS — M5136 Other intervertebral disc degeneration, lumbar region: Secondary | ICD-10-CM | POA: Insufficient documentation

## 2022-08-11 DIAGNOSIS — Z9682 Presence of neurostimulator: Secondary | ICD-10-CM | POA: Insufficient documentation

## 2022-08-11 DIAGNOSIS — M961 Postlaminectomy syndrome, not elsewhere classified: Secondary | ICD-10-CM | POA: Diagnosis present

## 2022-08-11 DIAGNOSIS — M5442 Lumbago with sciatica, left side: Secondary | ICD-10-CM | POA: Insufficient documentation

## 2022-08-11 DIAGNOSIS — G8929 Other chronic pain: Secondary | ICD-10-CM | POA: Diagnosis present

## 2022-08-11 MED ORDER — MORPHINE SULFATE ER 30 MG PO TBCR: 30.0000 mg | EXTENDED_RELEASE_TABLET | Freq: Two times a day (BID) | ORAL | 0 refills | Status: DC

## 2022-08-11 MED ORDER — NALOXONE HCL 4 MG/0.1ML NA LIQD: 1.0000 | NASAL | 0 refills | Status: DC | PRN

## 2022-08-11 NOTE — Patient Instructions (Addendum)
Patient has Narcan and Family aware how to use it.   Opioid Overdose Opioids are drugs that are often used to treat pain. Opioids include illegal drugs, such as heroin, as well as prescription pain medicines, such as codeine, morphine, hydrocodone, and fentanyl. An opioid overdose happens when you take too much of an opioid. An overdose may be intentional or accidental and can happen with any type of opioid. The effects of an overdose can be mild, dangerous, or even deadly. Opioid overdose is a medical emergency. What are the causes? This condition may be caused by: Taking too much of an opioid on purpose. Taking too much of an opioid by accident. Using two or more substances that contain opioids at the same time. Taking an opioid with a substance that affects your heart, breathing, or blood pressure. These include alcohol, tranquilizers, sleeping pills, illegal drugs, and some over-the-counter medicines. This condition may also happen due to an error made by: A health care provider who prescribes a medicine. The pharmacist who fills the prescription. What increases the risk? This condition is more likely in: Children. They may be attracted to colorful pills. Because of a child's small size, even a small amount of a medicine can be dangerous. Older people. They may be taking many different medicines. Older people may have difficulty reading labels or remembering when they last took their medicines. They may also be more sensitive to the effects of opioids. People with chronic medical conditions, especially heart, liver, kidney, or neurological diseases. People who take an opioid for a long period of time. People who take opioids and use illegal drugs, such as heroin, or other substances, such as alcohol. People who: Have a history of drug or alcohol abuse. Have certain mental health conditions. Have a history of previous drug overdoses. People who take opioids that are not prescribed for  them. What are the signs or symptoms? Symptoms of this condition depend on the type of opioid and the amount that was taken. Common symptoms include: Sleepiness or difficulty waking from sleep. Confusion. Slurred speech. Slowed breathing and a slow pulse (bradycardia). Nausea and vomiting. Abnormally small pupils. Signs and symptoms that require emergency treatment include: Cold, clammy, and pale skin. Blue lips and fingernails. Vomiting. Gurgling sounds in the throat. A pulse that is very slow or difficult to detect. Breathing that is very irregular, slow, noisy, or difficult to detect. Inability to respond to speech or be awakened from sleep (stupor). Seizures. How is this diagnosed? This condition is diagnosed based on your symptoms and medical history. It is important to tell your health care provider: About all of the opioids that you took. When you took the opioids. Whether you were drinking alcohol or using marijuana, cocaine, or other drugs. Your health care provider will do a physical exam. This exam may include: Checking and monitoring your heart rate and rhythm, breathing rate, temperature, and blood pressure. Measuring oxygen levels in your blood. Checking for abnormally small pupils. You may also have blood tests or urine tests. You may have X-rays if you are having severe breathing problems. How is this treated? This condition requires immediate medical treatment and hospitalization. Reversing the effects of the opioid is the first step in treatment. If you have a Narcan kit or naloxone, use it right away. Follow your health care provider's instructions. A friend or family member can also help you with this. The rest of your treatment will be given in the hospital intensive care (ICU). Treatment in the  hospital may include: Giving salts and minerals (electrolytes) along with fluids through an IV. Inserting a breathing tube (endotracheal tube) in your airway to help you  breathe if you cannot breathe on your own or you are in danger of not being able to breathe on your own. Giving oxygen through a small tube under your nose. Passing a tube through your nose and into your stomach (nasogastric tube, or NG tube) to empty your stomach. Giving medicines that: Increase your blood pressure. Relieve nausea and vomiting. Relieve abdominal pain and cramping. Reverse the effects of the opioid (naloxone). Monitoring your heart and oxygen levels. Ongoing counseling and mental health support if you intentionally overdosed or used an illegal drug. Follow these instructions at home:  Medicines Take over-the-counter and prescription medicines only as told by your health care provider. Always ask your health care provider about possible side effects and interactions of any new medicine that you start taking. Keep a list of all the medicines that you take, including over-the-counter medicines. Bring this list with you to all your medical visits. General instructions Drink enough fluid to keep your urine pale yellow. Keep all follow-up visits. This is important. How is this prevented? Read the drug inserts that come with your opioid pain medicines. Take medicines only as told by your health care provider. Do not take more medicine than you are told. Do not take medicines more frequently than you are told. Do not drink alcohol or take sedatives when taking opioids. Do not use illegal or recreational drugs, including cocaine, ecstasy, and marijuana. Do not take opioid medicines that are not prescribed for you. Store all medicines in safety containers that are out of the reach of children. Get help if you are struggling with: Alcohol or drug use. Depression or another mental health problem. Thoughts of hurting yourself or another person. Keep the phone number of your local poison control center near your phone or in your mobile phone. In the [place] the hotline of the [facility] is (780) 565-1624. If you were prescribed naloxone, make sure you understand how to take it. Contact a health care provider if: You need help understanding how to take your pain medicines. You feel your medicines are too strong. You are concerned that your pain medicines are not working well for your pain. You develop new symptoms or side effects when you are taking medicines. Get help right away if: You or someone else is having symptoms of an opioid overdose. Get help even if you are not sure. You have thoughts about hurting yourself or others. You have: Chest pain. Difficulty breathing. A loss of consciousness. These symptoms may represent a serious problem that is an emergency. Do not wait to see if the symptoms will go away. Get medical help right away. Call your local emergency services (911 in the [place] Do not drive yourself to the hospital. If you ever feel like you may hurt yourself or others, or have thoughts about taking your own life, get help right away. You can go to your nearest emergency department or: Call your local emergency services (911 in the [place] Call a suicide crisis helpline, such as the [place] at (662) 249-7942 or 988 in the [place]. This is open 24 hours a day in the [place] Text the Crisis Text Line at 630-505-3611 (in the [place].). Summary Opioids are drugs that are often used to treat pain. Opioids include illegal drugs, such as heroin, as well as prescription pain  medicines. An opioid overdose happens when you take too much of an opioid. Overdoses can be intentional or accidental. Opioid overdose is very dangerous. It is a life-threatening emergency. If you or someone you know is experiencing an opioid overdose, get help right away. This information is not intended to replace advice given to you by your health care provider. Make sure you discuss any questions you have with your health care provider. Document Revised:  03/12/2021 Document Reviewed: 11/27/2020 Elsevier Patient Education  [place].  ____________________________________________________________________________________________  [facility] Pain Medication Shortage  The [place] is experiencing worsening drug shortages. These have had a negative widespread effect on patient care and treatment. Not expected to improve any time soon. Predicted to last past 2029.   Drug shortage list (generic names) Oxycodone IR Oxycodone/APAP Oxymorphone IR Hydromorphone Hydrocodone/APAP Morphine  Where is the problem?  Manufacturing and supply level.  Will this shortage affect you?  Only if you take any of the above pain medications.  How? You may be unable to fill your prescription.  Your pharmacist may offer a "partial fill" of your prescription. (Warning: Do not accept partial fills.) Prescriptions partially filled cannot be transferred to another pharmacy. Read our Medication Rules and Regulation. Depending on how much medicine you are dependent on, you may experience withdrawals when unable to get the medication.  Recommendations: Consider ending your dependence on opioid pain medications. Ask your pain specialist to assist you with the process. Consider switching to a medication currently not in shortage, such as Buprenorphine. Talk to your pain specialist about this option. Consider decreasing your pain medication requirements by managing tolerance thru "Drug Holidays". This may help minimize withdrawals, should you run out of medicine. Control your pain thru the use of non-pharmacological interventional therapies.   Your prescriber: Prescribers cannot be blamed for shortages. Medication manufacturing and supply issues cannot be fixed by the prescriber.   NOTE: The prescriber is not responsible for supplying the medication, or solving supply issues. Work with your pharmacist to solve it. The patient is responsible for the decision to take or  continue taking the medication and for identifying and securing a legal supply source. By law, supplying the medication is the job and responsibility of the pharmacy. The prescriber is responsible for the evaluation, monitoring, and prescribing of these medications.   Prescribers will NOT: Re-issue prescriptions that have been partially filled. Re-issue prescriptions already sent to a pharmacy.  Re-send prescriptions to a different pharmacy because yours did not have your medication. Ask pharmacist to order more medicine or transfer the prescription to another pharmacy. (Read below.)  New 2023 regulation: "May 01, 2022 Revised Regulation Allows DEA-Registered Pharmacies to Transfer Electronic Prescriptions at a Patient's Request [place] Patients now have the ability to request their electronic prescription be transferred to another pharmacy without having to go back to their practitioner to initiate the request. This revised regulation went into effect on Monday, April 27, 2022.     At a patient's request, a DEA-registered retail pharmacy can now transfer an electronic prescription for a controlled substance (schedules II-V) to another DEA-registered retail pharmacy. Prior to this change, patients would have to go through their practitioner to cancel their prescription and have it re-issued to a different pharmacy. The process was taxing and time consuming for both patients and practitioners.    The Drug Enforcement Administration [facility]) published its intent to revise the process for transferring electronic prescriptions on July 19, 2020.  The final  rule was published in the federal register on March 26, 2022 and went into effect 30 days later.  Under the final rule, a prescription can only be transferred once between pharmacies, and only if allowed under existing state or other applicable law. The prescription must remain in its electronic  form; may not be altered in any way; and the transfer must be communicated directly between two licensed pharmacists. It's important to note, any authorized refills transfer with the original prescription, which means the entire prescription will be filled at the same pharmacy".  Reference: [employer] [facility] website announcement)  [email or web address] ([place])   [employer] / Vol. 88, No. 143 / Thursday, March 26, 2022 / Rules and Regulations DEPARTMENT OF JUSTICE  Drug Enforcement Administration  21 CFR Part 1306  [Docket No. [id number]]  RIN [id number] Transfer of Electronic Prescriptions for Schedules II-V Controlled Substances Between Pharmacies for Initial Filling  ____________________________________________________________________________________________     ____________________________________________________________________________________________  Patient Information update  To: All of our patients.  Re: Name change.  It has been made official that our current name, "[facility]"   will soon be changed to "[facility]".   The purpose of this change is to eliminate any confusion created by the concept of our practice being a "Medication Management Pain Clinic". In the past this has led to the misconception that we treat pain primarily by the use of prescription medications.  Nothing can be farther from the truth.   Understanding PAIN MANAGEMENT: To further understand what our practice does, you first have to understand that "Pain Management" is a subspecialty that requires additional training once a physician has completed their specialty training, which can be in either Anesthesia,  Neurology, Psychiatry, or Physical Medicine and Rehabilitation (PMR). Each one of these contributes to the final approach taken by each physician to the management of their patient's pain. To be a "Pain Management Specialist" you must have first completed one of the specialty trainings below.  Anesthesiologists - trained in clinical pharmacology and interventional techniques such as nerve blockade and regional as well as central neuroanatomy. They are trained to block pain before, during, and after surgical interventions.  Neurologists - trained in the diagnosis and pharmacological treatment of complex neurological conditions, such as Multiple Sclerosis, Parkinson's, spinal cord injuries, and other systemic conditions that may be associated with symptoms that may include but are not limited to pain. They tend to rely primarily on the treatment of chronic pain using prescription medications.  Psychiatrist - trained in conditions affecting the psychosocial wellbeing of patients including but not limited to depression, anxiety, schizophrenia, personality disorders, addiction, and other substance use disorders that may be associated with chronic pain. They tend to rely primarily on the treatment of chronic pain using prescription medications.   Physical Medicine and Rehabilitation (PMR) physicians, also known as physiatrists - trained to treat a wide variety of medical conditions affecting the brain, spinal cord, nerves, bones, joints, ligaments, muscles, and tendons. Their training is primarily aimed at treating patients that have suffered injuries that have caused severe physical impairment. Their training is primarily aimed at the physical therapy and rehabilitation of those patients. They may also work alongside orthopedic surgeons or neurosurgeons using their expertise in assisting surgical patients to recover after their surgeries.  INTERVENTIONAL PAIN MANAGEMENT is sub-subspecialty of Pain Management.   Our physicians are Board-certified in Anesthesia, Pain Management, and Interventional Pain Management.  This meaning that not only have they been trained and Board-certified in their specialty of Anesthesia, and subspecialty of Pain Management, but they have also received further training in the sub-subspecialty of Interventional Pain Management, in order to become Board-certified as INTERVENTIONAL PAIN MANAGEMENT SPECIALIST.    Mission: Our goal is to use our skills in  [place] as alternatives to the chronic use of prescription opioid medications for the treatment of pain. To make this more clear, we have changed our name to reflect what we do and offer. We will continue to offer medication management assessment and recommendations, but we will not be taking over any patient's medication management.  ____________________________________________________________________________________________     ____________________________________________________________________________________________  Drug Holidays  What is a "Drug Holiday"? Drug Holiday: is the name given to the process of slowly tapering down and temporarily stopping the pain medication for the purpose of decreasing or eliminating tolerance to the drug.  Benefits Improved effectiveness Decreased required effective dose Improved pain control End dependence on high dose therapy Decrease cost of therapy Uncovering "opioid-induced hyperalgesia". (OIH)  What is "opioid hyperalgesia"? It is a paradoxical increase in pain caused by exposure to opioids. Stopping the opioid pain medication, contrary to the expected, it actually decreases or completely eliminates the pain. Ref.: "A comprehensive review of opioid-induced hyperalgesia". Brion Aliment, et.al. Pain Physician. 2011 Mar-Apr;14(2):145-61.  What is tolerance? Tolerance: the progressive loss of effectiveness of a pain medicine due to repetitive use. A common  problem of opioid pain medications.  How long should a "Drug Holiday" last? Effectiveness depends on the patient staying off all opioid pain medicines for a minimum of 14 consecutive days. (2 weeks)  How about just taking less of the medicine? Does not work. Will not accomplish goal of eliminating the excess receptors.  How about switching to a different pain medicine? (AKA. "Opioid rotation") Does not work. Creates the illusion of effectiveness by taking advantage of inaccurate equivalent dose calculations between different opioids. -This "technique" was promoted by studies funded by [employer], such as [employer], creators of "OxyContin".  Can I stop the medicine "cold [place]"? Depends. You should always coordinate with your Pain Specialist to make the transition as smoothly as possible. Avoid stopping the medicine abruptly without consulting. We recommend a "slow taper".  What is a slow taper? Taper: refers to the gradual decrease in dose.   How do I stop/taper the dose? Slowly. Decrease the daily amount of pills that you take by one (1) pill every seven (7) days. This is called a "slow downward taper". Example: if you normally take four (4) pills per day, drop it to three (3) pills per day for seven (7) days, then to two (2) pills per day for seven (7) days, then to one (1) per day for seven (7) days, and then stop the medicine. The 14 day "Drug Holiday" starts on the first day without medicine.   Will I experience withdrawals? Unlikely with a slow taper.  What triggers withdrawals? Withdrawals are triggered by the sudden/abrupt stop of high dose opioids. Withdrawals can be avoided by slowly decreasing the dose over a prolonged period of time.  What are withdrawals? Symptoms associated with sudden/abrupt reduction/stopping of high-dose, long-term use of pain medication. Withdrawal are seldom seen on low dose therapy, or patients rarely taking opioid  medication.  Early Withdrawal Symptoms may include: Agitation Anxiety Muscle aches Increased tearing Insomnia Runny nose Sweating Yawning  Late symptoms may include: Abdominal cramping Diarrhea Dilated pupils Goose  bumps Nausea Vomiting  (Last update: 08/09/2022) ____________________________________________________________________________________________    _______________________________________________________________________  Medication Rules  Purpose: To inform patients, and their family members, of our medication rules and regulations.  Applies to: All patients receiving prescriptions from our practice (written or electronic).  Pharmacy of record: This is the pharmacy where your electronic prescriptions will be sent. Make sure we have the correct one.  Electronic prescriptions: In compliance with the [place] (STOP) Act of 2017 (Session Lanny Cramp [phone]), effective August 31, 2018, all controlled substances must be electronically prescribed. Written prescriptions, faxing, or calling prescriptions to a pharmacy will no longer be done.  Prescription refills: These will be provided only during in-person appointments. No medications will be renewed without a "face-to-face" evaluation with your provider. Applies to all prescriptions.  NOTE: The following applies primarily to controlled substances (Opioid* Pain Medications).   Type of encounter (visit): For patients receiving controlled substances, face-to-face visits are required. (Not an option and not up to the patient.)  Patient's responsibilities: Pain Pills: Bring all pain pills to every appointment (except for procedure appointments). Pill Bottles: Bring pills in original pharmacy bottle. Bring bottle, even if empty. Always bring the bottle of the most recent fill.  Medication refills: You are responsible for knowing and keeping track of what medications you are taking and  when is it that you will need a refill. The day before your appointment: write a list of all prescriptions that need to be refilled. The day of the appointment: give the list to the admitting nurse. Prescriptions will be written only during appointments. No prescriptions will be written on procedure days. If you forget a medication: it will not be "Called in", "Faxed", or "electronically sent". You will need to get another appointment to get these prescribed. No early refills. Do not call asking to have your prescription filled early. Partial  or short prescriptions: Occasionally your pharmacy may not have enough pills to fill your prescription.  NEVER ACCEPT a partial fill or a prescription that is short of the total amount of pills that you were prescribed.  With controlled substances the law allows 72 hours for the pharmacy to complete the prescription.  If the prescription is not completed within 72 hours, the pharmacist will require a new prescription to be written. This means that you will be short on your medicine and we WILL NOT send another prescription to complete your original prescription.  Instead, request the pharmacy to send a carrier to a nearby branch to get enough medication to provide you with your full prescription. Prescription Accuracy: You are responsible for carefully inspecting your prescriptions before leaving our office. Have the discharge nurse carefully go over each prescription with you, before taking them home. Make sure that your name is accurately spelled, that your address is correct. Check the name and dose of your medication to make sure it is accurate. Check the number of pills, and the written instructions to make sure they are clear and accurate. Make sure that you are given enough medication to last until your next medication refill appointment. Taking Medication: Take medication as prescribed. When it comes to controlled substances, taking less pills or less frequently  than prescribed is permitted and encouraged. Never take more pills than instructed. Never take the medication more frequently than prescribed.  Inform other Doctors: Always inform, all of your healthcare providers, of all the medications you take. Pain Medication from other Providers: You are not allowed to accept any additional  pain medication from any other Doctor or Healthcare provider. There are two exceptions to this rule. (see below) In the event that you require additional pain medication, you are responsible for notifying us, as stated below. Cough Medicine: Often these contain an opioid, such as codeine or hydrocodone. Never accept or take cough medicine containing these opioids if you are already taking an opioid* medication. The combination may cause respiratory failure and death. Medication Agreement: You are responsible for carefully reading and following our Medication Agreement. This must be signed before receiving any prescriptions from our practice. Safely store a copy of your signed Agreement. Violations to the Agreement will result in no further prescriptions. (Additional copies of our Medication Agreement are available upon request.) Laws, Rules, & Regulations: All patients are expected to follow all Federal and [employer], [employer], Rules, [facility]. Ignorance of the Laws does not constitute a valid excuse.  Illegal drugs and Controlled Substances: The use of illegal substances (including, but not limited to marijuana and its derivatives) and/or the illegal use of any controlled substances is strictly prohibited. Violation of this rule may result in the immediate and permanent discontinuation of any and all prescriptions being written by our practice. The use of any illegal substances is prohibited. Adopted CDC guidelines & recommendations: Target dosing levels will be at or below 60 MME/day. Use of benzodiazepines** is not recommended.  Exceptions: There are only two exceptions  to the rule of not receiving pain medications from other Healthcare Providers. Exception #1 (Emergencies): In the event of an emergency (i.e.: accident requiring emergency care), you are allowed to receive additional pain medication. However, you are responsible for: As soon as you are able, call our office [phone], at any time of the day or night, and leave a message stating your name, the date and nature of the emergency, and the name and dose of the medication prescribed. In the event that your call is answered by a member of our staff, make sure to document and save the date, time, and the name of the person that took your information.  Exception #2 (Planned Surgery): In the event that you are scheduled by another doctor or dentist to have any type of surgery or procedure, you are allowed (for a period no longer than 30 days), to receive additional pain medication, for the acute post-op pain. However, in this case, you are responsible for picking up a copy of our "Post-op Pain Management for Surgeons" handout, and giving it to your surgeon or dentist. This document is available at our office, and does not require an appointment to obtain it. Simply go to our office during business hours (Monday-Thursday from 8:00 AM to 4:00 PM) (Friday 8:00 AM to 12:00 Noon) or if you have a scheduled appointment with [place], prior to your surgery, and ask for it by name. In addition, you are responsible for: calling our office [phone], at any time of the day or night, and leaving a message stating your name, name of your surgeon, type of surgery, and date of procedure or surgery. Failure to comply with your responsibilities may result in termination of therapy involving the controlled substances. Medication Agreement Violation. Following the above rules, including your responsibilities will help you in avoiding a Medication Agreement Violation ("Breaking your Pain Medication Contract").  Consequences:  Not  following the above rules may result in permanent discontinuation of medication prescription therapy.  *Opioid medications include: morphine, codeine, oxycodone, oxymorphone, hydrocodone, hydromorphone, meperidine, tramadol, tapentadol, buprenorphine, fentanyl,  methadone. **Benzodiazepine medications include: diazepam (Valium), alprazolam (Xanax), clonazepam (Klonopine), lorazepam (Ativan), clorazepate (Tranxene), chlordiazepoxide (Librium), estazolam (Prosom), oxazepam (Serax), temazepam (Restoril), triazolam (Halcion) (Last updated: 06/23/2022) ______________________________________________________________________    ______________________________________________________________________  Medication Recommendations and Reminders  Applies to: All patients receiving prescriptions (written and/or electronic).  Medication Rules & Regulations: You are responsible for reading, knowing, and following our "Medication Rules" document. These exist for your safety and that of others. They are not flexible and neither are we. Dismissing or ignoring them is an act of "non-compliance" that may result in complete and irreversible termination of such medication therapy. For safety reasons, "non-compliance" will not be tolerated. As with the [place] fundamental legal principle of "ignorance of the law is no defense", we will accept no excuses for not having read and knowing the content of documents provided to you by our practice.  Pharmacy of record:  Definition: This is the pharmacy where your electronic prescriptions will be sent.  We do not endorse any particular pharmacy. It is up to you and your insurance to decide what pharmacy to use.  We do not restrict you in your choice of pharmacy. However, once we write for your prescriptions, we will NOT be re-sending more prescriptions to fix restricted supply problems created by your pharmacy, or your insurance.  The pharmacy listed in the electronic medical record  should be the one where you want electronic prescriptions to be sent. If you choose to change pharmacy, simply notify our nursing staff. Changes will be made only during your regular appointments and not over the phone.  Recommendations: Keep all of your pain medications in a safe place, under lock and key, even if you live alone. We will NOT replace lost, stolen, or damaged medication. We do not accept "Police Reports" as proof of medications having been stolen. After you fill your prescription, take 1 week's worth of pills and put them away in a safe place. You should keep a separate, properly labeled bottle for this purpose. The remainder should be kept in the original bottle. Use this as your primary supply, until it runs out. Once it's gone, then you know that you have 1 week's worth of medicine, and it is time to come in for a prescription refill. If you do this correctly, it is unlikely that you will ever run out of medicine. To make sure that the above recommendation works, it is very important that you make sure your medication refill appointments are scheduled at least 1 week before you run out of medicine. To do this in an effective manner, make sure that you do not leave the office without scheduling your next medication management appointment. Always ask the nursing staff to show you in your prescription , when your medication will be running out. Then arrange for the receptionist to get you a return appointment, at least 7 days before you run out of medicine. Do not wait until you have 1 or 2 pills left, to come in. This is very poor planning and does not take into consideration that we may need to cancel appointments due to bad weather, sickness, or emergencies affecting our staff. DO NOT ACCEPT A "Partial Fill": If for any reason your pharmacy does not have enough pills/tablets to completely fill or refill your prescription, do not allow for a "partial fill". The law allows the pharmacy to  complete that prescription within 72 hours, without requiring a new prescription. If they do not fill the rest of your prescription within those  72 hours, you will need a separate prescription to fill the remaining amount, which we will NOT provide. If the reason for the partial fill is your insurance, you will need to talk to the pharmacist about payment alternatives for the remaining tablets, but again, DO NOT ACCEPT A PARTIAL FILL, unless you can trust your pharmacist to obtain the remainder of the pills within 72 hours.  Prescription refills and/or changes in medication(s):  Prescription refills, and/or changes in dose or medication, will be conducted only during scheduled medication management appointments. (Applies to both, written and electronic prescriptions.) No refills on procedure days. No medication will be changed or started on procedure days. No changes, adjustments, and/or refills will be conducted on a procedure day. Doing so will interfere with the diagnostic portion of the procedure. No phone refills. No medications will be "called into the pharmacy". No Fax refills. No weekend refills. No Holliday refills. No after hours refills.  Remember:  Business hours are:  Monday to Thursday 8:00 AM to 4:00 PM Provider's Schedule: Milinda Pointer, MD - Appointments are:  Medication management: Monday and Wednesday 8:00 AM to 4:00 PM Procedure day: Tuesday and Thursday 7:30 AM to 4:00 PM Gillis Santa, MD - Appointments are:  Medication management: Tuesday and Thursday 8:00 AM to 4:00 PM Procedure day: Monday and Wednesday 7:30 AM to 4:00 PM (Last update: 06/23/2022) ______________________________________________________________________    ____________________________________________________________________________________________  WARNING: CBD (cannabidiol) & Delta (Delta-8 tetrahydrocannabinol) products.   Applicable to:  All individuals currently taking or considering taking  CBD (cannabidiol) and, more important, all patients taking opioid analgesic controlled substances (pain medication). (Example: oxycodone; oxymorphone; hydrocodone; hydromorphone; morphine; methadone; tramadol; tapentadol; fentanyl; buprenorphine; butorphanol; dextromethorphan; meperidine; codeine; etc.)  Introduction:  Recently there has been a drive towards the use of "natural" products for the treatment of different conditions, including pain anxiety and sleep disorders. Marijuana and hemp are two varieties of the cannabis genus plants. Marijuana and its derivatives are illegal, while hemp and its derivatives are not. Cannabidiol (CBD) and tetrahydrocannabinol (THC), are two natural compounds found in plants of the Cannabis genus. They can both be extracted from hemp or marijuana. Both compounds interact with your body's endocannabinoid system in very different ways. CBD is associated with pain relief (analgesia) while THC is associated with the psychoactive effects ("the high") obtained from the use of marijuana products. There are two main types of THC: Delta-9, which comes from the marijuana plant and it is illegal, and Delta-8, which comes from the hemp plant, and it is legal. (Both, Delta-9-THC and Delta-8-THC are psychoactive and give you "the high".)   Legality:  Marijuana and its derivatives: illegal Hemp and its derivatives: Legal (State dependent) UPDATE: (10/17/2021) The Drug Enforcement Agency ([place]) issued a letter stating that "delta" cannabinoids, including Delta-8-THCO and Delta-9-THCO, synthetically derived from hemp do not qualify as hemp and will be viewed as Schedule I drugs. (Schedule I drugs, substances, or chemicals are defined as drugs with no currently accepted medical use and a high potential for abuse. Some examples of Schedule I drugs are: heroin, lysergic acid diethylamide (LSD), marijuana (cannabis), 3,4-methylenedioxymethamphetamine (ecstasy), methaqualone, and peyote.)  (https://jennings.com/)  Legal status of CBD in [place]:  "Conditionally Legal"  Reference: "FDA Regulation of Cannabis and Cannabis-Derived Products, Including Cannabidiol (CBD)" - SeekArtists.com.pt  Warning:  CBD is not FDA approved and has not undergo the same manufacturing controls as prescription drugs.  This means that the purity and safety of available CBD may be questionable. Most of the time,  despite manufacturer's claims, it is contaminated with THC (delta-9-tetrahydrocannabinol - the chemical in marijuana responsible for the "HIGH").  When this is the case, the [facility] contaminant will trigger a positive urine drug screen (UDS) test for Marijuana (carboxy-THC).   The FDA recently put out a warning about 5 things that everyone should be aware of regarding Delta-8 THC: Delta-8 THC products have not been evaluated or approved by the FDA for safe use and may be marketed in ways that put the public health at risk. The FDA has received adverse event reports involving delta-8 THC-containing products. Delta-8 THC has psychoactive and intoxicating effects. Delta-8 THC manufacturing often involve use of potentially harmful chemicals to create the concentrations of delta-8 THC claimed in the marketplace. The final delta-8 THC product may have potentially harmful by-products (contaminants) due to the chemicals used in the process. Manufacturing of delta-8 THC products may occur in uncontrolled or unsanitary settings, which may lead to the presence of unsafe contaminants or other potentially harmful substances. Delta-8 THC products should be kept out of the reach of children and pets.  NOTE: Because a positive UDS for any illicit substance is a violation of our medication agreement, your opioid analgesics (pain medicine) may be permanently discontinued.  MORE ABOUT CBD  General Information: CBD  was discovered in 7 and it is a derivative of the cannabis sativa genus plants (Marijuana and Hemp). It is one of the 113 identified substances found in Marijuana. It accounts for up to 40% of the plant's extract. As of 2018, preliminary clinical studies on CBD included research for the treatment of anxiety, movement disorders, and pain. CBD is available and consumed in multiple forms, including inhalation of smoke or vapor, as an aerosol spray, and by mouth. It may be supplied as an oil containing CBD, capsules, dried cannabis, or as a liquid solution. CBD is thought not to be as psychoactive as THC (delta-9-tetrahydrocannabinol - the chemical in marijuana responsible for the "HIGH"). Studies suggest that CBD may interact with different biological target receptors in the body, including cannabinoid and other neurotransmitter receptors. As of 2018 the mechanism of action for its biological effects has not been determined.  Side-effects  Adverse reactions: Dry mouth, diarrhea, decreased appetite, fatigue, drowsiness, malaise, weakness, sleep disturbances, and others.  Drug interactions:  CBD may interact with medications such as blood-thinners. CBD causes drowsiness on its own and it will increase drowsiness caused by other medications, including antihistamines (such as Benadryl), benzodiazepines (Xanax, Ativan, Valium), antipsychotics, antidepressants, opioids, alcohol and supplements such as kava, melatonin and St. John's Wort.  Other drug interactions: Brivaracetam (Briviact); Caffeine; Carbamazepine (Tegretol); Citalopram (Celexa); Clobazam (Onfi); Eslicarbazepine (Aptiom); Everolimus (Zostress); Lithium; Methadone (Dolophine); Rufinamide (Banzel); Sedative medications (CNS depressants); Sirolimus (Rapamune); Stiripentol (Diacomit); Tacrolimus (Prograf); Tamoxifen ; Soltamox); Topiramate (Topamax); Valproate; Warfarin (Coumadin); Zonisamide. (Last update:  08/10/2022) ____________________________________________________________________________________________   ____________________________________________________________________________________________  Naloxone Nasal Spray  Why am I receiving this medication? [place] STOP ACT requires that all patients taking high dose opioids or at risk of opioids respiratory depression, be prescribed an opioid reversal agent, such as Naloxone (AKA: Narcan).  What is this medication? NALOXONE (nal OX one) treats opioid overdose, which causes slow or shallow breathing, severe drowsiness, or trouble staying awake. Call emergency services after using this medication. You may need additional treatment. Naloxone works by reversing the effects of opioids. It belongs to a group of medications called opioid blockers.  COMMON BRAND NAME(S): Kloxxado, Narcan  What should I tell my care team before I  take this medication? They need to know if you have any of these conditions: Heart disease Substance use disorder An unusual or allergic reaction to naloxone, other medications, foods, dyes, or preservatives Pregnant or trying to get pregnant Breast-feeding  When to use this medication? This medication is to be used for the treatment of respiratory depression (less than 8 breaths per minute) secondary to opioid overdose.   How to use this medication? This medication is for use in the nose. Lay the person on their back. Support their neck with your hand and allow the head to tilt back before giving the medication. The nasal spray should be given into 1 nostril. After giving the medication, move the person onto their side. Do not remove or test the nasal spray until ready to use. Get emergency medical help right away after giving the first dose of this medication, even if the person wakes up. You should be familiar with how to recognize the signs and symptoms of a narcotic overdose. If more doses are needed, give  the additional dose in the other nostril. Talk to your care team about the use of this medication in children. While this medication may be prescribed for children as young as newborns for selected conditions, precautions do apply.  Naloxone Overdosage: If you think you have taken too much of this medicine contact a poison control center or emergency room at once.  NOTE: This medicine is only for you. Do not share this medicine with others.  What if I miss a dose? This does not apply.  What may interact with this medication? This is only used during an emergency. No interactions are expected during emergency use. This list may not describe all possible interactions. Give your health care provider a list of all the medicines, herbs, non-prescription drugs, or dietary supplements you use. Also tell them if you smoke, drink alcohol, or use illegal drugs. Some items may interact with your medicine.  What should I watch for while using this medication? Keep this medication ready for use in the case of an opioid overdose. Make sure that you have the phone number of your care team and local hospital ready. You may need to have additional doses of this medication. Each nasal spray contains a single dose. Some emergencies may require additional doses. After use, bring the treated person to the nearest hospital or call 911. Make sure the treating care team knows that the person has received a dose of this medication. You will receive additional instructions on what to do during and after use of this medication before an emergency occurs.  What side effects may I notice from receiving this medication? Side effects that you should report to your care team as soon as possible: Allergic reactions--skin rash, itching, hives, swelling of the face, lips, tongue, or throat Side effects that usually do not require medical attention (report these to your care team if they continue or are  bothersome): Constipation Dryness or irritation inside the nose Headache Increase in blood pressure Muscle spasms Stuffy nose Toothache This list may not describe all possible side effects. Call your doctor for medical advice about side effects. You may report side effects to FDA at 1-800-FDA-1088.  Where should I keep my medication? Because this is an emergency medication, you should keep it with you at all times.  Keep out of the reach of children and pets. Store between 20 and 25 degrees C (68 and 77 degrees F). Do not freeze. Throw away any  unused medication after the expiration date. Keep in original box until ready to use.  NOTE: This sheet is a summary. It may not cover all possible information. If you have questions about this medicine, talk to your doctor, pharmacist, or health care provider.   2023 Elsevier/Gold Standard (2021-04-25 00:00:00)  ____________________________________________________________________________________________

## 2022-08-11 NOTE — Progress Notes (Signed)
PROVIDER NOTE: Interpretation of information contained herein should be left to medically-trained personnel. Specific patient instructions are provided elsewhere under "Patient Instructions" section of medical record. This document was created in part using STT-dictation technology, any transcriptional errors that may result from this process are unintentional.  Patient: Kendra Nelson Type: Established DOB: [birth year] MRN: [id number] PCP: Berkley Harvey, NP  Service: Procedure DOS: 08/11/2022 Setting: Ambulatory Location: Ambulatory outpatient facility Delivery: Face-to-face Provider: Gaspar Cola, MD Specialty: Interventional Pain Management Specialty designation: 09 Location: Outpatient facility Ref. Prov.: Berkley Harvey, NP    Primary Reason for Visit: Interventional Pain Management Treatment. CC: Back Pain  Procedure:          Type: Management of Intrathecal Drug Delivery System (IDDS) - Reservoir Refill [id number]). No rate change.  Indications: 1. 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 Chronic low back pain (1ry area of Pain) (Bilateral) (R>L) w/ sciatica (Bilateral)   4. Chronic lower extremity pain (2ry area of Pain) (Bilateral) (R>L)   5. Chronic lumbosacral radiculopathy (L5) (Left)   6. DDD (degenerative disc disease), lumbar   7. Lumbar facet syndrome (Bilateral) (R>L)   8. Presence of neurostimulator   9. Presence of intrathecal pump   10. Pharmacologic therapy   11. Long term prescription opiate use   12. Chronic use of opiate for therapeutic purpose   13. Encounter for adjustment and management of infusion pump   14. Encounter for medication management   15. Encounter for chronic pain management    Pain Assessment: Self-Reported Pain Score: 4 /10             Reported level is compatible with observation.        RTCB: 11/12/2022    Intrathecal Drug Delivery System (IDDS)  Pump Device:   Manufacturer: Medtronic Model: Synchromed II Model No.: [id number] Serial No.: [id number] Delivery Route: Intrathecal Type: Programmable  Volume (mL): 40 mL reservoir Priming Volume: n/a  Calibration Constant: 120.0  MRI compatibility: Conditional   Implant Details:  Date: 05/09/2019 Implanter: Clydell Hakim, MD  Contact Information: [facility] Last Revision/Replacement: 05/09/2019 Estimated Replacement Date: May/2027  Implant Site: Abdominal Laterality: Left  Catheter: Manufacturer: Medtronic Model:  (two piece) Model No.: [id number]  Serial No.: n/a  Implanted Length (cm): 38.1  Catheter Volume (mL): 0.214  Tip Location (Level): T6-7 Canal Access Site: n/a  Drug content:  Primary Medication Class: Opioid  Medication: PF-Fentanyl  Concentration: 1,000 mcg/mL   Secondary Medication Class: Local Anesthetic  Medication: PF-Bupivacaine  Concentration: 20.0 mg/mL   Tertiary Medication Class: none   PA parameters (PCA-mode):  Mode: Off (Inactive)  Programming:  Type: Simple continuous.  Medication, Concentration, Infusion Program, & Delivery Rate: For up-to-date details please see most recent scanned programming printout.       Changes:  Medication Change: None at this point Rate Change: No change in rate  Reported side-effects or adverse reactions: None reported  Effectiveness: Described as relatively effective, allowing for increase in activities of daily living (ADL) Clinically meaningful improvement in function (CMIF): Sustained CMIF goals met  Plan: Pump refill today   Pharmacotherapy Assessment   Opioid Analgesic: Morphine (MS Contin) 30 mg PO BID (60 mg/day of morphine) (60 MME/day) +  PF-(intrathecal)-Fentanyl (13.4 mcg/hr)  changed from Oxycodone IR 10 mg 4x/day (40 mg/day of oxycodone) (60 MME/day), due to national shortage MME/day: 60 MME/day (oral) + 32.16 MME/day (intrathecal) = 93.16 MME/day (Aprox.  1.16 MME/day/kg).    Monitoring: [place] PMP: PDMP reviewed during this encounter.       Pharmacotherapy: No side-effects or adverse reactions reported. Compliance: No problems identified. Effectiveness: Clinically acceptable. Plan: Refer to "POC". UDS:  Summary  Date Value Ref Range Status  02/05/2022 Note  Final    Comment:    ==================================================================== [employer] Select 13 (MW) ==================================================================== Test                             Result       Flag       Units  Drug Present and Declared for Prescription Verification   Morphine                       >16129       EXPECTED   ng/mg creat   Normorphine                    671          EXPECTED   ng/mg creat    Potential sources of large amounts of morphine in the absence of    codeine include administration of morphine or use of heroin.     Normorphine is an expected metabolite of morphine.    Fentanyl                       11           EXPECTED   ng/mg creat   Norfentanyl                    90           EXPECTED   ng/mg creat    Source of fentanyl is a scheduled prescription medication, including    IV, patch, and transmucosal formulations. Norfentanyl is an expected    metabolite of fentanyl.  ==================================================================== Test                      Result    Flag   Units      Ref Range   Creatinine              62               mg/dL      >=20 ==================================================================== Declared Medications:  The flagging and interpretation on this report are based on the  following declared medications.  Unexpected results may arise from  inaccuracies in the declared medications.   **Note: The testing scope of this panel includes these medications:   Fentanyl  Morphine (MS Contin)   **Note: The testing scope of this panel does not include the  following reported medications:   Atorvastatin  (Lipitor)  Biotin  Bupivacaine  Calcium  Carvedilol (Coreg)  Furosemide (Lasix)  Lisinopril (Zestril)  Metoprolol (Lopressor)  Naloxone (Narcan)  Vitamin C  Vitamin D  Vitamin E ==================================================================== For clinical consultation, please call [phone]. ====================================================================    No results found for: "CBDTHCR", "D8THCCBX", "D9THCCBX"   Pre-op H&P Assessment:  Ms. Scrima is a 61 y.o. (year old), female patient, seen today for interventional treatment. 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 Ms. Fleece has a current medication list which includes the following prescription(s): AMBULATORY NON FORMULARY MEDICATION, amlodipine, atorvastatin, carvedilol, furosemide, lisinopril, [START ON 08/14/2022] morphine, [START ON 09/13/2022] morphine, [START ON 10/13/2022] morphine, and naloxone. Her primarily concern today is the Back Pain  Initial Vital Signs:  Pulse/HCG Rate: 71  Temp: 97.6 F (36.4 C) Resp: 18 BP: (!) 174/104 SpO2: 99 %  BMI: Estimated body mass index is 30.65 kg/m as calculated from the following:   Height as of this encounter: _0  (1.6 m).   Weight as of this encounter: 173 lb (78.5 kg).  Risk Assessment: Allergies: Reviewed. She is allergic to benadryl [diphenhydramine hcl], bee venom, and dexamethasone.  Allergy Precautions: None required Coagulopathies: Reviewed. None identified.  Blood-thinner therapy: None at this time Active Infection(s): Reviewed. None identified. Ms. Hon is  afebrile  Site Confirmation: Ms. Frankowski was asked to confirm the procedure and laterality before marking the site Procedure checklist: Completed Consent: Before the procedure and under the influence of no sedative(s), amnesic(s), or anxiolytics, the patient was informed of the treatment options, risks and possible complications. To fulfill our ethical and legal obligations, as recommended by the American Medical Association's Code of Ethics, I have informed the patient of my clinical impression; the nature and purpose of the treatment or procedure; the risks, benefits, and possible complications of the intervention; the alternatives, including doing nothing; the risk(s) and benefit(s) of the alternative treatment(s) or procedure(s); and the risk(s) and benefit(s) of doing nothing.  Ms. Arca was provided with information about the general risks and possible complications associated with most interventional procedures. These include, but are not limited to: failure to achieve desired goals, infection, bleeding, organ or nerve damage, allergic reactions, paralysis, and/or death.  In addition, she was informed of those risks and possible complications associated to this particular procedure, which include, but are not limited to: damage to the implant; failure to decrease pain; local, systemic, or serious CNS infections, intraspinal abscess with possible cord compression and paralysis, or life-threatening such as meningitis; bleeding; organ damage; nerve injury or damage with subsequent sensory, motor, and/or autonomic system dysfunction, resulting in transient or permanent pain, numbness, and/or weakness of one or several areas of the body; allergic reactions, either minor or major life-threatening, such as anaphylactic or anaphylactoid reactions.  Furthermore, Ms. Losee was informed of those risks and complications associated with the medications. These include, but are not limited to: allergic  reactions (i.e.: anaphylactic or anaphylactoid reactions); endorphine suppression; bradycardia and/or hypotension; water retention and/or peripheral vascular relaxation leading to lower extremity edema and possible stasis ulcers; respiratory depression and/or shortness of breath; decreased metabolic rate leading to weight gain; swelling or edema; medication-induced neural toxicity; particulate matter embolism and blood vessel occlusion with resultant organ, and/or nervous system infarction; and/or intrathecal granuloma formation with possible spinal cord compression and permanent paralysis.  Before refilling the pump Ms. Dercole was informed that some of the medications used in the devise may not be FDA approved for such use and therefore it constitutes an off-label use of the medications.  Finally, she was informed that Medicine is not an exact science; therefore, there is also the possibility of unforeseen or unpredictable risks and/or possible complications that may result in a catastrophic outcome. The patient indicated having understood very clearly. We have given the patient no guarantees and we have made no promises. Enough time was given to the patient to ask questions, all of which  were answered to the patient's satisfaction. Ms. Romito has indicated that she wanted to continue with the procedure. Attestation: I, the ordering provider, attest that I have discussed with the patient the benefits, risks, side-effects, alternatives, likelihood of achieving goals, and potential problems during recovery for the procedure that I have provided informed consent. Date  Time: 08/11/2022  1:05 PM  Pre-Procedure Preparation:  Monitoring: As per clinic protocol. Respiration, ETCO2, SpO2, BP, heart rate and rhythm monitor placed and checked for adequate function Safety Precautions: Patient was assessed for positional comfort and pressure points before starting the procedure. Time-out: I initiated and  conducted the "Time-out" before starting the procedure, as per protocol. The patient was asked to participate by confirming the accuracy of the "Time Out" information. Verification of the correct person, site, and procedure were performed and confirmed by me, the nursing staff, and the patient. "Time-out" conducted as per Joint Commission's Universal Protocol (UP.01.01.01). Time: 1307  Description of Procedure:          Position: Supine Target Area: Central-port of intrathecal pump. Approach: Anterior, 90 degree angle approach. Area Prepped: Entire Area around the pump implant. DuraPrep (Iodine Povacrylex [0.7% available iodine] and Isopropyl Alcohol, 74% w/w) Safety Precautions: Aspiration looking for blood return was conducted prior to all injections. At no point did we inject any substances, as a needle was being advanced. No attempts were made at seeking any paresthesias. Safe injection practices and needle disposal techniques used. Medications properly checked for expiration dates. SDV (single dose vial) medications used. Description of the Procedure: Protocol guidelines were followed. Two nurses trained to do implant refills were present during the entire procedure. The refill medication was checked by both healthcare providers as well as the patient. The patient was included in the "Time-out" to verify the medication. The patient was placed in position. The pump was identified. The area was prepped in the usual manner. The sterile template was positioned over the pump, making sure the side-port location matched that of the pump. Both, the pump and the template were held for stability. The needle provided in the Medtronic Kit was then introduced thru the center of the template and into the central port. The pump content was aspirated and discarded volume documented. The new medication was slowly infused into the pump, thru the filter, making sure to avoid overpressure of the device. The needle was  then removed and the area cleansed, making sure to leave some of the prepping solution back to take advantage of its long term bactericidal properties. The pump was interrogated and programmed to reflect the correct medication, volume, and dosage. The program was printed and taken to the physician for approval. Once checked and signed by the physician, a copy was provided to the patient and another scanned into the EMR.  Vitals:   08/11/22 1303  BP: (!) 174/104  Pulse: 71  Resp: 18  Temp: 97.6 F (36.4 C)  TempSrc: Temporal  SpO2: 99%  Weight: 173 lb (78.5 kg)  Height: _0  (1.6 m)    Start Time: 1310 hrs. End Time: 1318 hrs. Materials & Medications: Medtronic Refill Kit Medication(s): Please see chart orders for details.  Imaging Guidance:          Type of Imaging Technique: None used Indication(s): N/A Exposure Time: No patient exposure Contrast: None used. Fluoroscopic Guidance: N/A Ultrasound Guidance: N/A Interpretation: N/A  Antibiotic Prophylaxis:   Anti-infectives (From admission, onward)    None      Indication(s): None identified  Post-operative Assessment:  Post-procedure Vital Signs:  Pulse/HCG Rate: 71  Temp: 97.6 F (36.4 C) Resp: 18 BP: (!) 174/104 SpO2: 99 %  EBL: None  Complications: No immediate post-treatment complications observed by team, or reported by patient.  Note: The patient tolerated the entire procedure well. A repeat set of vitals were taken after the procedure and the patient was kept under observation following institutional policy, for this type of procedure. Post-procedural neurological assessment was performed, showing return to baseline, prior to discharge. The patient was provided with post-procedure discharge instructions, including a section on how to identify potential problems. Should any problems arise concerning this procedure, the patient was given instructions to immediately contact us, at any time, without hesitation.  In any case, we plan to contact the patient by telephone for a follow-up status report regarding this interventional procedure.  Comments:  No additional relevant information.  Plan of Care  Orders:  Orders Placed This Encounter  Procedures   PUMP REFILL    Maintain Protocol by having two(2) healthcare providers during procedure and programming.    Scheduling Instructions:     Please refill intrathecal pump today.    Order Specific Question:   Where will this procedure be performed?    Answer:   [facility]   PUMP REFILL    Whenever possible schedule on a procedure today.    Standing Status:   Future    Standing Expiration Date:   12/11/2022    Scheduling Instructions:     Please schedule intrathecal pump refill based on pump programming. Avoid schedule intervals of more than 120 days (4 months).    Order Specific Question:   Where will this procedure be performed?    Answer:   [facility]   Informed Consent Details: Physician/Practitioner Attestation; Transcribe to consent form and obtain patient signature    Transcribe to consent form and obtain patient signature.    Order Specific Question:   Physician/Practitioner attestation of informed consent for procedure/surgical case    Answer:   I, the physician/practitioner, attest that I have discussed with the patient the benefits, risks, side effects, alternatives, likelihood of achieving goals and potential problems during recovery for the procedure that I have provided informed consent.    Order Specific Question:   Procedure    Answer:   Intrathecal pump refill    Order Specific Question:   Physician/Practitioner performing the procedure    Answer:   Attending Physician: Kathlen Brunswick. Dossie Arbour, MD & designated trained staff    Order Specific Question:   Indication/Reason    Answer:   Chronic Pain Syndrome (G89.4), presence of an intrathecal pump (Z97.8)   Chronic Opioid Analgesic:  Morphine (MS Contin) 30 mg PO BID  (60 mg/day of morphine) (60 MME/day) +  PF-(intrathecal)-Fentanyl (13.4 mcg/hr)  changed from Oxycodone IR 10 mg 4x/day (40 mg/day of oxycodone) (60 MME/day), due to national shortage MME/day: 60 MME/day (oral) + 32.16 MME/day (intrathecal) = 93.16 MME/day (Aprox. 1.16 MME/day/kg).   Medications ordered for procedure: Meds ordered this encounter  Medications   morphine (MS CONTIN) 30 MG 12 hr tablet    Sig: Take 1 tablet (30 mg total) by mouth every 12 (twelve) hours. Must last 30 days. Do not break tablet    Dispense:  60 tablet    Refill:  0    DO NOT: delete (not duplicate); no partial-fill (will deny script to complete), no refill request (F/U required). DISPENSE: 1 day early if closed on fill  date. WARN: No CNS-depressants within 8 hrs of med.   morphine (MS CONTIN) 30 MG 12 hr tablet    Sig: Take 1 tablet (30 mg total) by mouth every 12 (twelve) hours. Must last 30 days. Do not break tablet    Dispense:  60 tablet    Refill:  0    DO NOT: delete (not duplicate); no partial-fill (will deny script to complete), no refill request (F/U required). DISPENSE: 1 day early if closed on fill date. WARN: No CNS-depressants within 8 hrs of med.   morphine (MS CONTIN) 30 MG 12 hr tablet    Sig: Take 1 tablet (30 mg total) by mouth every 12 (twelve) hours. Must last 30 days. Do not break tablet    Dispense:  60 tablet    Refill:  0    DO NOT: delete (not duplicate); no partial-fill (will deny script to complete), no refill request (F/U required). DISPENSE: 1 day early if closed on fill date. WARN: No CNS-depressants within 8 hrs of med.   naloxone (NARCAN) nasal spray 4 mg/0.1 mL    Sig: Place 1 spray into the nose as needed for up to 365 doses (for opioid-induced respiratory depresssion). In case of emergency (overdose), spray once into each nostril. If no response within 3 minutes, repeat application and call [phone].    Dispense:  1 each    Refill:  0    Instruct patient in proper use of device.    Medications administered: Hassan Rowan A. Huckaba had no medications administered during this visit.  See the medical record for exact dosing, route, and time of administration.  Follow-up plan:   Return for Pump Refill (Max:83mo.       Interventional Therapies  Risk  Complexity Considerations:   Estimated body mass index is 31 kg/m as calculated from the following:   Height as of this encounter: _0  (1.6 m).   Weight as of this encounter: 175 lb (79.4 kg). ELIQUIS Anticoagulation (Stop: 3 days  Restart: 6 hours) Severe anxiety and agoraphobia  Poor candidate for RFA secondary to Lumbar hardware   Planned  Pending:   Intrathecal pump refill as per pump programming.   Under consideration:   Diagnostic left IA knee joint inj  Diagnostic bilateral L2 TFESI  Diagnostic left genicular NB  Possible left genicular nerve RFA  Diagnostic caudal ESI + epidurogram  Possible Racz procedure    Completed:   Palliative intrathecal pump refills and adjustments  [facility] (05/10/2012)  [facility]athecal pump [facility] (05/09/2019)  Spinal cord stimulator [facility] (06/09/2016)    Therapeutic  Palliative (PRN) options:   Therapeutic/palliative intrathecal pump  management (analysis and refill)       Recent Visits Date Type Provider Dept  05/14/22 Procedure visit NMilinda Pointer MD [facility]  Showing recent visits within past 90 days and meeting all other requirements Today's Visits Date Type Provider Dept  08/11/22 Procedure visit NMilinda Pointer MD [facility]  Showing today's visits and meeting all other requirements Future Appointments Date Type Provider Dept  11/05/22 Appointment NMilinda Pointer MD [facility]  Showing future appointments within next 90 days and meeting all other  requirements  Disposition: Discharge home  Discharge (Date  Time): 08/11/2022; 1321 hrs.   Primary Care Physician: JBerkley Harvey NP [facility] Note by: FGaspar Cola MD Date: 08/11/2022;  Time: 1:30 PM  Disclaimer:  Medicine is not an Chief Strategy Officer. The only guarantee in medicine is that nothing is guaranteed. It is important to note that the decision to proceed with this intervention was based on the information collected from the patient. The Data and conclusions were drawn from the patient's questionnaire, the interview, and the physical examination. Because the information was provided in large part by the patient, it cannot be guaranteed that it has not been purposely or unconsciously manipulated. Every effort has been made to obtain as much relevant data as possible for this evaluation. It is important to note that the conclusions that lead to this procedure are derived in large part from the available data. Always take into account that the treatment will also be dependent on availability of resources and existing treatment guidelines, considered by other Pain Management Practitioners as being common knowledge and practice, at the time of the intervention. For Medico-Legal purposes, it is also important to point out that variation in procedural techniques and pharmacological choices are the acceptable norm. The indications, contraindications, technique, and results of the above procedure should only be interpreted and judged by a Board-Certified Interventional Pain Specialist with extensive familiarity and expertise in the same exact procedure and technique.

## 2022-08-11 NOTE — Progress Notes (Signed)
Safety precautions to be maintained throughout the outpatient stay will include: orient to surroundings, keep bed in low position, maintain call bell within reach at all times, provide assistance with transfer out of bed and ambulation.   Nursing Pain Medication Assessment:  Safety precautions to be maintained throughout the outpatient stay will include: orient to surroundings, keep bed in low position, maintain call bell within reach at all times, provide assistance with transfer out of bed and ambulation.  Medication Inspection Compliance: Pill count conducted under aseptic conditions, in front of the patient. Neither the pills nor the bottle was removed from the patient's sight at any time. Once count was completed pills were immediately returned to the patient in their original bottle.  Medication: Morphine ER (MSContin) Pill/Patch Count:  05 of 60 pills remain Pill/Patch Appearance: Markings consistent with prescribed medication Bottle Appearance: Standard pharmacy container. Clearly labeled. Filled Date: 52 / 15 / 2023 Last Medication intake:  Today

## 2022-08-12 ENCOUNTER — Telehealth: Payer: Self-pay | Admitting: *Deleted

## 2022-08-12 MED FILL — Medication: INTRATHECAL | Qty: 1 | Status: AC

## 2022-08-12 NOTE — Telephone Encounter (Signed)
Called for post pump refill check. No answer . LVM for patient to call for any issues.

## 2022-10-14 ENCOUNTER — Other Ambulatory Visit: Payer: Self-pay

## 2022-10-14 MED ORDER — PAIN MANAGEMENT IT PUMP REFILL: 1.0000 | Freq: Once | INTRATHECAL | 0 refills | Status: AC

## 2022-11-04 NOTE — Progress Notes (Unsigned)
PROVIDER NOTE: Interpretation of information contained herein should be left to medically-trained personnel. Specific patient instructions are provided elsewhere under "Patient Instructions" section of medical record. This document was created in part using STT-dictation technology, any transcriptional errors that may result from this process are unintentional.  Patient: Kendra Nelson Type: Established DOB: [birth year] MRN: [id number] PCP: Berkley Harvey, NP  Service: Procedure DOS: 11/05/2022 Setting: Ambulatory Location: Ambulatory outpatient facility Delivery: Face-to-face Provider: Gaspar Cola, MD Specialty: Interventional Pain Management Specialty designation: 09 Location: Outpatient facility Ref. Prov.: Berkley Harvey, NP       Interventional Therapy   Primary Reason for Visit: Interventional Pain Management Treatment. CC: No chief complaint on file.  Procedure:          Type: Management of Intrathecal Drug Delivery System (IDDS) - Reservoir Refill [phone]). No rate change.  Indications: 1. 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 Chronic low back pain (1ry area of Pain) (Bilateral) (R>L) w/ sciatica (Bilateral)   4. Chronic lower extremity pain (2ry area of Pain) (Bilateral) (R>L)   5. Chronic lumbosacral radiculopathy (L5) (Left)   6. DDD (degenerative disc disease), lumbar   7. Lumbar facet syndrome (Bilateral) (R>L)   8. Presence of neurostimulator   9. Presence of intrathecal pump   10. Pharmacologic therapy   11. Long term prescription opiate use   12. Chronic use of opiate for therapeutic purpose   13. Encounter for adjustment and management of infusion pump   14. Encounter for medication management   15. Encounter for chronic pain management    Pain Assessment: Self-Reported Pain Score:  /10             Reported level is compatible with observation.         Intrathecal Drug Delivery System (IDDS)   Pump Device:  Manufacturer: Medtronic Model: Synchromed II Model No.: [id number] Serial No.: [id number] Delivery Route: Intrathecal Type: Programmable  Volume (mL): 40 mL reservoir Priming Volume: n/a  Calibration Constant: 120.0  MRI compatibility: Conditional   Implant Details:  Date: 05/09/2019 Implanter: Clydell Hakim, MD  Contact Information: [facility] Last Revision/Replacement: 05/09/2019 Estimated Replacement Date: May/2027  Implant Site: Abdominal Laterality: Left  Catheter: Manufacturer: Medtronic Model:  (two piece) Model No.: [id number]  Serial No.: n/a  Implanted Length (cm): 38.1  Catheter Volume (mL): 0.214  Tip Location (Level): T6-7 Canal Access Site: n/a  Drug content:  Primary Medication Class: Opioid  Medication: PF-Fentanyl  Concentration: 1,000 mcg/mL   Secondary Medication Class: Local Anesthetic  Medication: PF-Bupivacaine  Concentration: 20.0 mg/mL   Tertiary Medication Class: none   PA parameters (PCA-mode):  Mode: Off (Inactive)  Programming:  Type: Simple continuous.  Medication, Concentration, Infusion Program, & Delivery Rate: For up-to-date details please see most recent scanned programming printout.        Changes:  Medication Change: None at this point Rate Change: No change in rate  Reported side-effects or adverse reactions: None reported  Effectiveness: Described as relatively effective, allowing for increase in activities of daily living (ADL) Clinically meaningful improvement in function (CMIF): Sustained CMIF goals met  Plan: Pump refill today   Pharmacotherapy Assessment   Opioid Analgesic: Morphine (MS Contin) 30 mg PO BID (60 mg/day of morphine) (60 MME/day) +  PF-(intrathecal)-Fentanyl (13.4 mcg/hr)  changed from Oxycodone IR 10 mg 4x/day (40 mg/day of oxycodone) (60 MME/day), due to national shortage MME/day: 43 MME/day (oral) +  32.16 MME/day (intrathecal) = 93.16 MME/day (Aprox. 1.16  MME/day/kg).   Monitoring: [place] PMP: PDMP reviewed during this encounter.       Pharmacotherapy: No side-effects or adverse reactions reported. Compliance: No problems identified. Effectiveness: Clinically acceptable. Plan: Refer to "POC". UDS:  Summary  Date Value Ref Range Status  02/05/2022 Note  Final    Comment:    ==================================================================== [employer] Select 13 (MW) ==================================================================== Test                             Result       Flag       Units  Drug Present and Declared for Prescription Verification   Morphine                       >16129       EXPECTED   ng/mg creat   Normorphine                    671          EXPECTED   ng/mg creat    Potential sources of large amounts of morphine in the absence of    codeine include administration of morphine or use of heroin.     Normorphine is an expected metabolite of morphine.    Fentanyl                       11           EXPECTED   ng/mg creat   Norfentanyl                    90           EXPECTED   ng/mg creat    Source of fentanyl is a scheduled prescription medication, including    IV, patch, and transmucosal formulations. Norfentanyl is an expected    metabolite of fentanyl.  ==================================================================== Test                      Result    Flag   Units      Ref Range   Creatinine              62               mg/dL      >=20 ==================================================================== Declared Medications:  The flagging and interpretation on this report are based on the  following declared medications.  Unexpected results may arise from  inaccuracies in the declared medications.   **Note: The testing scope of this panel includes these medications:   Fentanyl  Morphine (MS Contin)   **Note: The testing scope of this panel does not include the  following reported medications:    Atorvastatin (Lipitor)  Biotin  Bupivacaine  Calcium  Carvedilol (Coreg)  Furosemide (Lasix)  Lisinopril (Zestril)  Metoprolol (Lopressor)  Naloxone (Narcan)  Vitamin C  Vitamin D  Vitamin E ==================================================================== For clinical consultation, please call [phone]. ====================================================================    No results found for: "CBDTHCR", "D8THCCBX", "D9THCCBX"   Pre-op H&P Assessment:  Ms. Kamen is a 62 y.o. (year old), female patient, seen today for interventional treatment. 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 Ms. Kitchings has a current medication list which includes the following prescription(s): AMBULATORY NON FORMULARY MEDICATION, amlodipine, atorvastatin, carvedilol, furosemide, lisinopril, morphine, morphine, morphine, and naloxone. Her primarily concern today is the No chief complaint on file.  Initial Vital Signs:  Pulse/HCG Rate:    Temp:   Resp:   BP:   SpO2:    BMI: Estimated body mass index is 30.65 kg/m as calculated from the following:   Height as of 08/11/22: '5\' 3"'$  (1.6 m).   Weight as of 08/11/22: 173 lb (78.5 kg).  Risk Assessment: Allergies: Reviewed. She is allergic to benadryl [diphenhydramine hcl], bee venom, and dexamethasone.  Allergy Precautions: None required Coagulopathies: Reviewed. None identified.  Blood-thinner therapy: None at this time Active Infection(s): Reviewed. None identified. Ms. Hemberger is afebrile  Site Confirmation: Ms. Gathings was asked to confirm the  procedure and laterality before marking the site Procedure checklist: Completed Consent: Before the procedure and under the influence of no sedative(s), amnesic(s), or anxiolytics, the patient was informed of the treatment options, risks and possible complications. To fulfill our ethical and legal obligations, as recommended by the American Medical Association's Code of Ethics, I have informed the patient of my clinical impression; the nature and purpose of the treatment or procedure; the risks, benefits, and possible complications of the intervention; the alternatives, including doing nothing; the risk(s) and benefit(s) of the alternative treatment(s) or procedure(s); and the risk(s) and benefit(s) of doing nothing.  Ms. Prier was provided with information about the general risks and possible complications associated with most interventional procedures. These include, but are not limited to: failure to achieve desired goals, infection, bleeding, organ or nerve damage, allergic reactions, paralysis, and/or death.  In addition, she was informed of those risks and possible complications associated to this particular procedure, which include, but are not limited to: damage to the implant; failure to decrease pain; local, systemic, or serious CNS infections, intraspinal abscess with possible cord compression and paralysis, or life-threatening such as meningitis; bleeding; organ damage; nerve injury or damage with subsequent sensory, motor, and/or autonomic system dysfunction, resulting in transient or permanent pain, numbness, and/or weakness of one or several areas of the body; allergic reactions, either minor or major life-threatening, such as anaphylactic or anaphylactoid reactions.  Furthermore, Ms. Mendias was informed of those risks and complications associated with the medications. These include, but are not limited to: allergic reactions (i.e.: anaphylactic or anaphylactoid reactions); endorphine  suppression; bradycardia and/or hypotension; water retention and/or peripheral vascular relaxation leading to lower extremity edema and possible stasis ulcers; respiratory depression and/or shortness of breath; decreased metabolic rate leading to weight gain; swelling or edema; medication-induced neural toxicity; particulate matter embolism and blood vessel occlusion with resultant organ, and/or nervous system infarction; and/or intrathecal granuloma formation with possible spinal cord compression and permanent paralysis.  Before refilling the pump Ms. Stouder was informed that some of the medications used in the devise may not be FDA approved for such use and therefore it constitutes an off-label use of the medications.  Finally, she was informed that Medicine is not an exact science; therefore, there is also the possibility of unforeseen or unpredictable risks and/or possible complications that may result in a catastrophic outcome. The patient indicated having understood very clearly. We have given the patient no guarantees and we have made no promises. Enough time was given to the patient to ask questions, all of which were  answered to the patient's satisfaction. Ms. Brem has indicated that she wanted to continue with the procedure. Attestation: I, the ordering provider, attest that I have discussed with the patient the benefits, risks, side-effects, alternatives, likelihood of achieving goals, and potential problems during recovery for the procedure that I have provided informed consent. Date  Time: {CHL [facility]-PAIN TIME CHOICES:[id number]}  Pre-Procedure Preparation:  Monitoring: As per clinic protocol. Respiration, ETCO2, SpO2, BP, heart rate and rhythm monitor placed and checked for adequate function Safety Precautions: Patient was assessed for positional comfort and pressure points before starting the procedure. Time-out: I initiated and conducted the "Time-out" before starting the procedure,  as per protocol. The patient was asked to participate by confirming the accuracy of the "Time Out" information. Verification of the correct person, site, and procedure were performed and confirmed by me, the nursing staff, and the patient. "Time-out" conducted as per Joint Commission's Universal Protocol (UP.01.01.01). Time:    Description of Procedure:          Position: Supine Target Area: Central-port of intrathecal pump. Approach: Anterior, 90 degree angle approach. Area Prepped: Entire Area around the pump implant. DuraPrep (Iodine Povacrylex [0.7% available iodine] and Isopropyl Alcohol, 74% w/w) Safety Precautions: Aspiration looking for blood return was conducted prior to all injections. At no point did we inject any substances, as a needle was being advanced. No attempts were made at seeking any paresthesias. Safe injection practices and needle disposal techniques used. Medications properly checked for expiration dates. SDV (single dose vial) medications used. Description of the Procedure: Protocol guidelines were followed. Two nurses trained to do implant refills were present during the entire procedure. The refill medication was checked by both healthcare providers as well as the patient. The patient was included in the "Time-out" to verify the medication. The patient was placed in position. The pump was identified. The area was prepped in the usual manner. The sterile template was positioned over the pump, making sure the side-port location matched that of the pump. Both, the pump and the template were held for stability. The needle provided in the Medtronic Kit was then introduced thru the center of the template and into the central port. The pump content was aspirated and discarded volume documented. The new medication was slowly infused into the pump, thru the filter, making sure to avoid overpressure of the device. The needle was then removed and the area cleansed, making sure to leave some  of the prepping solution back to take advantage of its long term bactericidal properties. The pump was interrogated and programmed to reflect the correct medication, volume, and dosage. The program was printed and taken to the physician for approval. Once checked and signed by the physician, a copy was provided to the patient and another scanned into the EMR.  There were no vitals filed for this visit.  Start Time:   hrs. End Time:   hrs. Materials & Medications: Medtronic Refill Kit Medication(s): Please see chart orders for details.  Imaging Guidance:          Type of Imaging Technique: None used Indication(s): N/A Exposure Time: No patient exposure Contrast: None used. Fluoroscopic Guidance: N/A Ultrasound Guidance: N/A Interpretation: N/A  Antibiotic Prophylaxis:   Anti-infectives (From admission, onward)    None      Indication(s): None identified  Post-operative Assessment:  Post-procedure Vital Signs:  Pulse/HCG Rate:    Temp:   Resp:   BP:   SpO2:    EBL: None  Complications: No immediate  post-treatment complications observed by team, or reported by patient.  Note: The patient tolerated the entire procedure well. A repeat set of vitals were taken after the procedure and the patient was kept under observation following institutional policy, for this type of procedure. Post-procedural neurological assessment was performed, showing return to baseline, prior to discharge. The patient was provided with post-procedure discharge instructions, including a section on how to identify potential problems. Should any problems arise concerning this procedure, the patient was given instructions to immediately contact us, at any time, without hesitation. In any case, we plan to contact the patient by telephone for a follow-up status report regarding this interventional procedure.  Comments:  No additional relevant information.  Plan of Care (POC)  Orders:  No orders of the  defined types were placed in this encounter.  Chronic Opioid Analgesic:  Morphine (MS Contin) 30 mg PO BID (60 mg/day of morphine) (60 MME/day) +  PF-(intrathecal)-Fentanyl (13.4 mcg/hr)  changed from Oxycodone IR 10 mg 4x/day (40 mg/day of oxycodone) (60 MME/day), due to national shortage MME/day: 60 MME/day (oral) + 32.16 MME/day (intrathecal) = 93.16 MME/day (Aprox. 1.16 MME/day/kg).   Medications ordered for procedure: No orders of the defined types were placed in this encounter.  Medications administered: Hassan Rowan A. Kubitz had no medications administered during this visit.  See the medical record for exact dosing, route, and time of administration.  Follow-up plan:   No follow-ups on file.       Interventional Therapies  Risk  Complexity Considerations:   Estimated body mass index is 31 kg/m as calculated from the following:   Height as of this encounter: '5\' 3"'$  (1.6 m).   Weight as of this encounter: 175 lb (79.4 kg). ELIQUIS Anticoagulation (Stop: 3 days  Restart: 6 hours) Severe anxiety and agoraphobia  Poor candidate for RFA secondary to Lumbar hardware   Planned  Pending:   Intrathecal pump refill as per pump programming.   Under consideration:   Diagnostic left IA knee joint inj  Diagnostic bilateral L2 TFESI  Diagnostic left genicular NB  Possible left genicular nerve RFA  Diagnostic caudal ESI + epidurogram  Possible Racz procedure    Completed:   Palliative intrathecal pump refills and adjustments  Intrathecal pump insertion by Dr. Erline Levine [facility]) (05/10/2012)  Intrathecal pump replacement by Dr. Clydell Hakim [facility]) (05/09/2019)  Spinal cord stimulator replacement by Dr. Erline Levine [facility]) (06/09/2016)    Therapeutic  Palliative (PRN) options:   Therapeutic/palliative intrathecal pump  management (analysis and refill)         Recent Visits Date Type Provider Dept  08/11/22 Procedure visit  Milinda Pointer, MD [facility]  Showing recent visits within past 90 days and meeting all other requirements Future Appointments Date Type Provider Dept  11/05/22 Appointment Milinda Pointer, MD [facility]  Showing future appointments within next 90 days and meeting all other requirements  Disposition: Discharge home  Discharge (Date  Time): 11/05/2022;   hrs.   Primary Care Physician: Berkley Harvey, NP Location: [facility] Note by: Gaspar Cola, MD (TTS technology used. I apologize for any typographical errors that were not detected and corrected.) Date: 11/05/2022; Time: 7:23 AM  Disclaimer:  Medicine is not an Chief Strategy Officer. The only guarantee in medicine is that nothing is guaranteed. It is important to note that the decision to proceed with this intervention was based on the information collected from the patient. The Data and conclusions were  drawn from the patient's questionnaire, the interview, and the physical examination. Because the information was provided in large part by the patient, it cannot be guaranteed that it has not been purposely or unconsciously manipulated. Every effort has been made to obtain as much relevant data as possible for this evaluation. It is important to note that the conclusions that lead to this procedure are derived in large part from the available data. Always take into account that the treatment will also be dependent on availability of resources and existing treatment guidelines, considered by other Pain Management Practitioners as being common knowledge and practice, at the time of the intervention. For Medico-Legal purposes, it is also important to point out that variation in procedural techniques and pharmacological choices are the acceptable norm. The indications, contraindications, technique, and results of the above procedure should only be interpreted and judged by a Board-Certified  Interventional Pain Specialist with extensive familiarity and expertise in the same exact procedure and technique.

## 2022-11-05 ENCOUNTER — Encounter: Payer: Self-pay | Admitting: Pain Medicine

## 2022-11-05 ENCOUNTER — Ambulatory Visit: Payer: Medicare HMO | Attending: Pain Medicine | Admitting: Pain Medicine

## 2022-11-05 VITALS — BP 164/93 | HR 67 | Temp 97.4°F | Ht 63.0 in | Wt 173.0 lb

## 2022-11-05 DIAGNOSIS — M961 Postlaminectomy syndrome, not elsewhere classified: Secondary | ICD-10-CM | POA: Diagnosis not present

## 2022-11-05 DIAGNOSIS — Z79891 Long term (current) use of opiate analgesic: Secondary | ICD-10-CM | POA: Insufficient documentation

## 2022-11-05 DIAGNOSIS — M5136 Other intervertebral disc degeneration, lumbar region: Secondary | ICD-10-CM | POA: Insufficient documentation

## 2022-11-05 DIAGNOSIS — Z9682 Presence of neurostimulator: Secondary | ICD-10-CM | POA: Insufficient documentation

## 2022-11-05 DIAGNOSIS — M5441 Lumbago with sciatica, right side: Secondary | ICD-10-CM | POA: Insufficient documentation

## 2022-11-05 DIAGNOSIS — M79604 Pain in right leg: Secondary | ICD-10-CM | POA: Diagnosis not present

## 2022-11-05 DIAGNOSIS — Z978 Presence of other specified devices: Secondary | ICD-10-CM | POA: Insufficient documentation

## 2022-11-05 DIAGNOSIS — E559 Vitamin D deficiency, unspecified: Secondary | ICD-10-CM | POA: Diagnosis not present

## 2022-11-05 DIAGNOSIS — M5417 Radiculopathy, lumbosacral region: Secondary | ICD-10-CM | POA: Diagnosis not present

## 2022-11-05 DIAGNOSIS — M5442 Lumbago with sciatica, left side: Secondary | ICD-10-CM | POA: Insufficient documentation

## 2022-11-05 DIAGNOSIS — M899 Disorder of bone, unspecified: Secondary | ICD-10-CM | POA: Diagnosis not present

## 2022-11-05 DIAGNOSIS — G8929 Other chronic pain: Secondary | ICD-10-CM | POA: Insufficient documentation

## 2022-11-05 DIAGNOSIS — G894 Chronic pain syndrome: Secondary | ICD-10-CM | POA: Diagnosis not present

## 2022-11-05 DIAGNOSIS — Z789 Other specified health status: Secondary | ICD-10-CM | POA: Insufficient documentation

## 2022-11-05 DIAGNOSIS — M79605 Pain in left leg: Secondary | ICD-10-CM | POA: Diagnosis not present

## 2022-11-05 DIAGNOSIS — Z79899 Other long term (current) drug therapy: Secondary | ICD-10-CM | POA: Diagnosis not present

## 2022-11-05 DIAGNOSIS — M47816 Spondylosis without myelopathy or radiculopathy, lumbar region: Secondary | ICD-10-CM | POA: Diagnosis not present

## 2022-11-05 DIAGNOSIS — Z451 Encounter for adjustment and management of infusion pump: Secondary | ICD-10-CM | POA: Diagnosis not present

## 2022-11-05 MED ORDER — MORPHINE SULFATE ER 30 MG PO TBCR: 30.0000 mg | EXTENDED_RELEASE_TABLET | Freq: Two times a day (BID) | ORAL | 0 refills | Status: DC

## 2022-11-05 NOTE — Patient Instructions (Addendum)
____________________________________________________________________________________________  Muscle Spasms & Cramps  Cause(s):  Most common - vitamin and/or electrolyte (calcium, potassium, sodium, etc.) deficiencies. Post procedure - steroids can make your kidneys excrete electrolytes. If you happen to have been borderline low on your electrolytes, it may temporarily triggering cramps & spasms.  Possible triggers: Sweating - causes loss of electrolytes thru the skin. Steroids - causes loss of electrolytes thru the urine.  Treatment: Gatorade (or any other electrolyte-replenishing drink) - Take 1, 8 oz glass with each meal (3 times a day). OTC (over-the-counter) Magnesium 400 to 500 mg - Take 1 tablet twice a day (one with breakfast and one before bedtime). If you have kidney problems, talk to your primary care physician before taking any Magnesium. Tonic Water with quinine - Take 1, 8 oz glass before bedtime.   ____________________________________________________________________________________________     Patient has Narcan at [facility] and patients family is aware of how to use it.     Opioid Overdose Opioids are drugs that are often used to treat pain. Opioids include illegal drugs, such as heroin, as well as prescription pain medicines, such as codeine, morphine, hydrocodone, and fentanyl. An opioid overdose happens when you take too much of an opioid. An overdose may be intentional or accidental and can happen with any type of opioid. The effects of an overdose can be mild, dangerous, or even deadly. Opioid overdose is a medical emergency. What are the causes? This condition may be caused by: Taking too much of an opioid on purpose. Taking too much of an opioid by accident. Using two or more substances that contain opioids at the same time. Taking an opioid with a substance that affects your heart, breathing, or blood pressure. These include alcohol, tranquilizers, sleeping  pills, illegal drugs, and some over-the-counter medicines. This condition may also happen due to an error made by: A health care provider who prescribes a medicine. The pharmacist who fills the prescription. What increases the risk? This condition is more likely in: Children. They may be attracted to colorful pills. Because of a child's small size, even a small amount of a medicine can be dangerous. Older people. They may be taking many different medicines. Older people may have difficulty reading labels or remembering when they last took their medicines. They may also be more sensitive to the effects of opioids. People with chronic medical conditions, especially heart, liver, kidney, or neurological diseases. People who take an opioid for a long period of time. People who take opioids and use illegal drugs, such as heroin, or other substances, such as alcohol. People who: Have a history of drug or alcohol abuse. Have certain mental health conditions. Have a history of previous drug overdoses. People who take opioids that are not prescribed for them. What are the signs or symptoms? Symptoms of this condition depend on the type of opioid and the amount that was taken. Common symptoms include: Sleepiness or difficulty waking from sleep. Confusion. Slurred speech. Slowed breathing and a slow pulse (bradycardia). Nausea and vomiting. Abnormally small pupils. Signs and symptoms that require emergency treatment include: Cold, clammy, and pale skin. Blue lips and fingernails. Vomiting. Gurgling sounds in the throat. A pulse that is very slow or difficult to detect. Breathing that is very irregular, slow, noisy, or difficult to detect. Inability to respond to speech or be awakened from sleep (stupor). Seizures. How is this diagnosed? This condition is diagnosed based on your symptoms and medical history. It is important to tell your health care provider: About  all of the opioids that you  took. When you took the opioids. Whether you were drinking alcohol or using marijuana, cocaine, or other drugs. Your health care provider will do a physical exam. This exam may include: Checking and monitoring your heart rate and rhythm, breathing rate, temperature, and blood pressure. Measuring oxygen levels in your blood. Checking for abnormally small pupils. You may also have blood tests or urine tests. You may have X-rays if you are having severe breathing problems. How is this treated? This condition requires immediate medical treatment and hospitalization. Reversing the effects of the opioid is the first step in treatment. If you have a Narcan kit or naloxone, use it right away. Follow your health care provider's instructions. A friend or family member can also help you with this. The rest of your treatment will be given in the hospital intensive care (ICU). Treatment in the hospital may include: Giving salts and minerals (electrolytes) along with fluids through an IV. Inserting a breathing tube (endotracheal tube) in your airway to help you breathe if you cannot breathe on your own or you are in danger of not being able to breathe on your own. Giving oxygen through a small tube under your nose. Passing a tube through your nose and into your stomach (nasogastric tube, or NG tube) to empty your stomach. Giving medicines that: Increase your blood pressure. Relieve nausea and vomiting. Relieve abdominal pain and cramping. Reverse the effects of the opioid (naloxone). Monitoring your heart and oxygen levels. Ongoing counseling and mental health support if you intentionally overdosed or used an illegal drug. Follow these instructions at home:  Medicines Take over-the-counter and prescription medicines only as told by your health care provider. Always ask your health care provider about possible side effects and interactions of any new medicine that you start taking. Keep a list of all the  medicines that you take, including over-the-counter medicines. Bring this list with you to all your medical visits. General instructions Drink enough fluid to keep your urine pale yellow. Keep all follow-up visits. This is important. How is this prevented? Read the drug inserts that come with your opioid pain medicines. Take medicines only as told by your health care provider. Do not take more medicine than you are told. Do not take medicines more frequently than you are told. Do not drink alcohol or take sedatives when taking opioids. Do not use illegal or recreational drugs, including cocaine, ecstasy, and marijuana. Do not take opioid medicines that are not prescribed for you. Store all medicines in safety containers that are out of the reach of children. Get help if you are struggling with: Alcohol or drug use. Depression or another mental health problem. Thoughts of hurting yourself or another person. Keep the phone number of your local poison control center near your phone or in your mobile phone. In the [place] the hotline of the [facility] is 612-275-1760. If you were prescribed naloxone, make sure you understand how to take it. Contact a health care provider if: You need help understanding how to take your pain medicines. You feel your medicines are too strong. You are concerned that your pain medicines are not working well for your pain. You develop new symptoms or side effects when you are taking medicines. Get help right away if: You or someone else is having symptoms of an opioid overdose. Get help even if you are not sure. You have thoughts about hurting yourself or others. You have: Chest pain.  Difficulty breathing. A loss of consciousness. These symptoms may represent a serious problem that is an emergency. Do not wait to see if the symptoms will go away. Get medical help right away. Call your local emergency services (911 in the [place] Do not drive  yourself to the hospital. If you ever feel like you may hurt yourself or others, or have thoughts about taking your own life, get help right away. You can go to your nearest emergency department or: Call your local emergency services (911 in the [place] Call a suicide crisis helpline, such as the [facility] at 579-384-9866 or 988 in the [place]. This is open 24 hours a day in the [place] Text the Crisis Text Line at (234)788-1334 (in the [place].). Summary Opioids are drugs that are often used to treat pain. Opioids include illegal drugs, such as heroin, as well as prescription pain medicines. An opioid overdose happens when you take too much of an opioid. Overdoses can be intentional or accidental. Opioid overdose is very dangerous. It is a life-threatening emergency. If you or someone you know is experiencing an opioid overdose, get help right away. This information is not intended to replace advice given to you by your health care provider. Make sure you discuss any questions you have with your health care provider. Document Revised: 03/12/2021 Document Reviewed: 11/27/2020 Elsevier Patient Education  [place].  ____________________________________________________________________________________________  Opioid Pain Medication Update  To: All patients taking opioid pain medications. (I.e.: hydrocodone, hydromorphone, oxycodone, oxymorphone, morphine, codeine, methadone, tapentadol, tramadol, buprenorphine, fentanyl, etc.)  Re: Updated review of side effects and adverse reactions of opioid analgesics, as well as new information about long term effects of this class of medications.  Direct risks of long-term opioid therapy are not limited to opioid addiction and overdose. Potential medical risks include serious fractures, breathing problems during sleep, hyperalgesia, immunosuppression, chronic constipation, bowel obstruction, myocardial infarction, and tooth decay  secondary to xerostomia.  Unpredictable adverse effects that can occur even if you take your medication correctly: Cognitive impairment, respiratory depression, and death. Most people think that if they take their medication "correctly", and "as instructed", that they will be safe. Nothing could be farther from the truth. In reality, a significant amount of recorded deaths associated with the use of opioids has occurred in individuals that had taken the medication for a long time, and were taking their medication correctly. The following are examples of how this can happen: Patient taking his/her medication for a long time, as instructed, without any side effects, is given a certain antibiotic or another unrelated medication, which in turn triggers a "Drug-to-drug interaction" leading to disorientation, cognitive impairment, impaired reflexes, respiratory depression or an untoward event leading to serious bodily harm or injury, including death.  Patient taking his/her medication for a long time, as instructed, without any side effects, develops an acute impairment of liver and/or kidney function. This will lead to a rapid inability of the body to breakdown and eliminate their pain medication, which will result in effects similar to an "overdose", but with the same medicine and dose that they had always taken. This again may lead to disorientation, cognitive impairment, impaired reflexes, respiratory depression or an untoward event leading to serious bodily harm or injury, including death.  A similar problem will occur with patients as they grow older and their liver and kidney function begins to decrease as part of the aging process.  Background information: Historically, the original case for using long-term opioid therapy to  treat chronic noncancer pain was based on safety assumptions that subsequent experience has called into question. In 1996, the American Pain Society and the [place] Academy of Pain  Medicine issued a consensus statement supporting long-term opioid therapy. This statement acknowledged the dangers of opioid prescribing but concluded that the risk for addiction was low; respiratory depression induced by opioids was short-lived, occurred mainly in opioid-naive patients, and was antagonized by pain; tolerance was not a common problem; and efforts to control diversion should not constrain opioid prescribing. This has now proven to be wrong. Experience regarding the risks for opioid addiction, misuse, and overdose in community practice has failed to support these assumptions.  According to the Centers for Disease Control and Prevention, fatal overdoses involving opioid analgesics have increased sharply over the past decade. Currently, more than 96,700 people die from drug overdoses every year. Opioids are a factor in 7 out of every 10 overdose deaths. Deaths from drug overdose have surpassed motor vehicle accidents as the leading cause of death for individuals between the ages of 57 and 15.  Clinical data suggest that neuroendocrine dysfunction may be very common in both men and women, potentially causing hypogonadism, erectile dysfunction, infertility, decreased libido, osteoporosis, and depression. Recent studies linked higher opioid dose to increased opioid-related mortality. Controlled observational studies reported that long-term opioid therapy may be associated with increased risk for cardiovascular events. Subsequent meta-analysis concluded that the safety of long-term opioid therapy in elderly patients has not been proven.   Side Effects and adverse reactions: Common side effects: Drowsiness (sedation). Dizziness. Nausea and vomiting. Constipation. Physical dependence -- Dependence often manifests with withdrawal symptoms when opioids are discontinued or decreased. Tolerance -- As you take repeated doses of opioids, you require increased medication to experience the same effect of  pain relief. Respiratory depression -- This can occur in healthy people, especially with higher doses. However, people with COPD, asthma or other lung conditions may be even more susceptible to fatal respiratory impairment.  Uncommon side effects: An increased sensitivity to feeling pain and extreme response to pain (hyperalgesia). Chronic use of opioids can lead to this. Delayed gastric emptying (the process by which the contents of your stomach are moved into your small intestine). Muscle rigidity. Immune system and hormonal dysfunction. Quick, involuntary muscle jerks (myoclonus). Arrhythmia. Itchy skin (pruritus). Dry mouth (xerostomia).  Long-term side effects: Chronic constipation. Sleep-disordered breathing (SDB). Increased risk of bone fractures. Hypothalamic-pituitary-adrenal dysregulation. Increased risk of overdose.  RISKS: Fractures and Falls:  Opioids increase the risk and incidence of falls. This is of particular importance in elderly patients.  Endocrine System:  Long-term administration is associated with endocrine abnormalities (endocrinopathies). (Also known as Opioid-induced Endocrinopathy) Influences on both the hypothalamic-pituitary-adrenal axis?and the hypothalamic-pituitary-gonadal axis have been demonstrated with consequent hypogonadism and adrenal insufficiency in both sexes. Hypogonadism and decreased levels of dehydroepiandrosterone sulfate have been reported in men and women. Endocrine effects include: Amenorrhoea in women (abnormal absence of menstruation) Reduced libido in both sexes Decreased sexual function Erectile dysfunction in men Hypogonadisms (decreased testicular function with shrinkage of testicles) Infertility Depression and fatigue Loss of muscle mass Anxiety Depression Immune suppression Hyperalgesia Weight gain Anemia Osteoporosis Patients (particularly women of childbearing age) should avoid opioids. There is insufficient  evidence to recommend routine monitoring of asymptomatic patients taking opioids in the long-term for hormonal deficiencies.  Immune System: Human studies have demonstrated that opioids have an immunomodulating effect. These effects are mediated via opioid receptors both on immune effector cells and in the central nervous  system. Opioids have been demonstrated to have adverse effects on antimicrobial response and anti-tumour surveillance. Buprenorphine has been demonstrated to have no impact on immune function.  Opioid Induced Hyperalgesia: Human studies have demonstrated that prolonged use of opioids can lead to a state of abnormal pain sensitivity, sometimes called opioid induced hyperalgesia (OIH). Opioid induced hyperalgesia is not usually seen in the absence of tolerance to opioid analgesia. Clinically, hyperalgesia may be diagnosed if the patient on long-term opioid therapy presents with increased pain. This might be qualitatively and anatomically distinct from pain related to disease progression or to breakthrough pain resulting from development of opioid tolerance. Pain associated with hyperalgesia tends to be more diffuse than the pre-existing pain and less defined in quality. Management of opioid induced hyperalgesia requires opioid dose reduction.  Cancer: Chronic opioid therapy has been associated with an increased risk of cancer among noncancer patients with chronic pain. This association was more evident in chronic strong opioid users. Chronic opioid consumption causes significant pathological changes in the small intestine and colon. Epidemiological studies have found that there is a link between opium dependence and initiation of gastrointestinal cancers. Cancer is the second leading cause of death after cardiovascular disease. Chronic use of opioids can cause multiple conditions such as GERD, immunosuppression and renal damage as well as carcinogenic effects, which are associated with  the incidence of cancers.   Mortality: Long-term opioid use has been associated with increased mortality among patients with chronic non-cancer pain (CNCP).  Prescription of long-acting opioids for chronic noncancer pain was associated with a significantly increased risk of all-cause mortality, including deaths from causes other than overdose.  Reference: Von Korff M, Kolodny A, Deyo RA, Chou R. Long-term opioid therapy reconsidered. Ann Intern Med. 2011 Sep 6;155(5):325-8. doi: 10.7326/[id number]. PMID: [id number]; PMCIDXX:1631110. Morley Kos, Hayward RA, Dunn KM, [place] KP. Risk of adverse events in patients prescribed long-term opioids: A cohort study in the [place] Clinical Practice Research [employer]. Eur J Pain. 2019 May;23(5):908-922. doi: 10.1002/ejp.1357. Epub 2019 Jan 31. PMID: [id number]. Colameco S, Coren JS, Ciervo CA. Continuous opioid treatment for chronic noncancer pain: a time for moderation in prescribing. Postgrad Med. 2009 Jul;121(4):61-6. doi: 10.3810/pgm.[id number]. PMID: [id number]. Heywood Bene RN, [place], Blazina I, Rosalio Loud, Bougatsos C, Deyo RA. The effectiveness and risks of long-term opioid therapy for chronic pain: a systematic review for a [employer] of Health Pathways to [employer]. Ann Intern Med. 2015 Feb 17;162(4):276-86. doi: [id number]. PMID: [id number]. Marjory Sneddon [facility], [place] DM. NCHS Data Brief No. 22. [place]: Centers for Disease Control and Prevention; 2009. Sep, Increase in Fatal Poisonings Involving Opioid Analgesics in the [place], 1999-2006. Song IA, Choi HR, Oh TK. Long-term opioid use and mortality in patients with chronic non-cancer pain: Ten-year follow-up study in [place] from 2010 through 2019. EClinicalMedicine. 2022 Jul 18;51:101558. doi: [id number]/j.[id number]. PMID: [id number]; PMCIDOX:8550940. Huser, W., Schubert, T., Vogelmann, T. et al. All-cause  mortality in patients with long-term opioid therapy compared with non-opioid analgesics for chronic non-cancer pain: a database study. Sellers Med 18, 162 (2020). [email or web address] Rashidian H, Roxy Cedar, Malekzadeh R, Haghdoost AA. An Ecological Study of the Association between Opiate Use and Incidence of Cancers. Addict Health. 2016 Fall;8(4):252-260. PMID: [id number]; PMCIDQI:9185013.  Our Goal: Our goal is to control your pain with means other than the use of opioid pain medications.  Our Recommendation: Talk to your physician about coming off of these medications. We can assist  you with the tapering down and stopping these medicines. Based on the new information, even if you cannot completely stop the medication, a decrease in the dose may be associated with a lesser risk. Ask for other means of controlling the pain. Decrease or eliminate those factors that significantly contribute to your pain such as smoking, obesity, and a diet heavily tilted towards "inflammatory" nutrients.  Last Updated: 10/28/2022   ____________________________________________________________________________________________     ____________________________________________________________________________________________  Patient Information update  To: All of our patients.  Re: Name change.  It has been made official that our current name, "[facility]"   will soon be changed to "[facility]".   The purpose of this change is to eliminate any confusion created by the concept of our practice being a "Medication Management Pain Clinic". In the past this has led to the misconception that we treat pain primarily by the use of prescription medications.  Nothing can be farther from the truth.   Understanding PAIN MANAGEMENT: To further understand what our practice does, you  first have to understand that "Pain Management" is a subspecialty that requires additional training once a physician has completed their specialty training, which can be in either Anesthesia, Neurology, Psychiatry, or Physical Medicine and Rehabilitation (PMR). Each one of these contributes to the final approach taken by each physician to the management of their patient's pain. To be a "Pain Management Specialist" you must have first completed one of the specialty trainings below.  Anesthesiologists - trained in clinical pharmacology and interventional techniques such as nerve blockade and regional as well as central neuroanatomy. They are trained to block pain before, during, and after surgical interventions.  Neurologists - trained in the diagnosis and pharmacological treatment of complex neurological conditions, such as Multiple Sclerosis, Parkinson's, spinal cord injuries, and other systemic conditions that may be associated with symptoms that may include but are not limited to pain. They tend to rely primarily on the treatment of chronic pain using prescription medications.  Psychiatrist - trained in conditions affecting the psychosocial wellbeing of patients including but not limited to depression, anxiety, schizophrenia, personality disorders, addiction, and other substance use disorders that may be associated with chronic pain. They tend to rely primarily on the treatment of chronic pain using prescription medications.   Physical Medicine and Rehabilitation (PMR) physicians, also known as physiatrists - trained to treat a wide variety of medical conditions affecting the brain, spinal cord, nerves, bones, joints, ligaments, muscles, and tendons. Their training is primarily aimed at treating patients that have suffered injuries that have caused severe physical impairment. Their training is primarily aimed at the physical therapy and rehabilitation of those patients. They may also work alongside  orthopedic surgeons or neurosurgeons using their expertise in assisting surgical patients to recover after their surgeries.  INTERVENTIONAL PAIN MANAGEMENT is sub-subspecialty of Pain Management.  Our physicians are Board-certified in Anesthesia, Pain Management, and Interventional Pain Management.  This meaning that not only have they been trained and Board-certified in their specialty of Anesthesia, and subspecialty of Pain Management, but they have also received further training in the sub-subspecialty of Interventional Pain Management, in order to become Board-certified as INTERVENTIONAL PAIN MANAGEMENT SPECIALIST.    Mission: Our goal is to use our skills in  [place] as alternatives to the chronic use of prescription opioid medications for the treatment of pain. To make this more clear, we have changed our name to reflect  what we do and offer. We will continue to offer medication management assessment and recommendations, but we will not be taking over any patient's medication management.  ____________________________________________________________________________________________     ____________________________________________________________________________________________  National Pain Medication Shortage  The [place] is experiencing worsening drug shortages. These have had a negative widespread effect on patient care and treatment. Not expected to improve any time soon. Predicted to last past 2029.   Drug shortage list (generic names) Oxycodone IR Oxycodone/APAP Oxymorphone IR Hydromorphone Hydrocodone/APAP Morphine  Where is the problem?  Manufacturing and supply level.  Will this shortage affect you?  Only if you take any of the above pain medications.  How? You may be unable to fill your prescription.  Your pharmacist may offer a "partial fill" of your prescription. (Warning: Do not accept partial fills.) Prescriptions partially filled cannot be  transferred to another pharmacy. Read our Medication Rules and Regulation. Depending on how much medicine you are dependent on, you may experience withdrawals when unable to get the medication.  Recommendations: Consider ending your dependence on opioid pain medications. Ask your pain specialist to assist you with the process. Consider switching to a medication currently not in shortage, such as Buprenorphine. Talk to your pain specialist about this option. Consider decreasing your pain medication requirements by managing tolerance thru "Drug Holidays". This may help minimize withdrawals, should you run out of medicine. Control your pain thru the use of non-pharmacological interventional therapies.   Your prescriber: Prescribers cannot be blamed for shortages. Medication manufacturing and supply issues cannot be fixed by the prescriber.   NOTE: The prescriber is not responsible for supplying the medication, or solving supply issues. Work with your pharmacist to solve it. The patient is responsible for the decision to take or continue taking the medication and for identifying and securing a legal supply source. By law, supplying the medication is the job and responsibility of the pharmacy. The prescriber is responsible for the evaluation, monitoring, and prescribing of these medications.   Prescribers will NOT: Re-issue prescriptions that have been partially filled. Re-issue prescriptions already sent to a pharmacy.  Re-send prescriptions to a different pharmacy because yours did not have your medication. Ask pharmacist to order more medicine or transfer the prescription to another pharmacy. (Read below.)  New 2023 regulation: "May 01, 2022 Revised Regulation Allows DEA-Registered Pharmacies to Transfer Electronic Prescriptions at a Patient's Request [place] Patients now have the ability to request their electronic prescription be transferred  to another pharmacy without having to go back to their practitioner to initiate the request. This revised regulation went into effect on Monday, April 27, 2022.     At a patient's request, a DEA-registered retail pharmacy can now transfer an electronic prescription for a controlled substance (schedules II-V) to another DEA-registered retail pharmacy. Prior to this change, patients would have to go through their practitioner to cancel their prescription and have it re-issued to a different pharmacy. The process was taxing and time consuming for both patients and practitioners.    The Drug Enforcement Administration [facility]) published its intent to revise the process for transferring electronic prescriptions on July 19, 2020.  The final rule was published in the federal register on March 26, 2022 and went into effect 30 days later.  Under the final rule, a prescription can only be transferred once between pharmacies, and only if allowed under existing state or other applicable law. The prescription must remain in its electronic form; may not be altered  in any way; and the transfer must be communicated directly between two licensed pharmacists. It's important to note, any authorized refills transfer with the original prescription, which means the entire prescription will be filled at the same pharmacy".  Reference: [email or web address] [facility] website announcement)  [email or web address] ([place])   [employer] / Vol. 88, No. 143 / Thursday, March 26, 2022 / Rules and Regulations DEPARTMENT OF JUSTICE  Drug Enforcement Administration  21 CFR Part 1306  [Docket No. [id number]]  RIN [id number] Transfer of Electronic Prescriptions for Schedules II-V Controlled Substances Between Pharmacies for Initial  Filling  ____________________________________________________________________________________________     ____________________________________________________________________________________________  Transfer of Pain Medication between Pharmacies  Re: 2023 DEA Clarification on existing regulation  Published on DEA Website: May 01, 2022  Title: Revised Regulation Allows DEA-Registered Pharmacies to Conservator, museum/gallery Prescriptions at a Patient's Request [place]  "Patients now have the ability to request their electronic prescription be transferred to another pharmacy without having to go back to their practitioner to initiate the request. This revised regulation went into effect on Monday, April 27, 2022.     At a patient's request, a DEA-registered retail pharmacy can now transfer an electronic prescription for a controlled substance (schedules II-V) to another DEA-registered retail pharmacy. Prior to this change, patients would have to go through their practitioner to cancel their prescription and have it re-issued to a different pharmacy. The process was taxing and time consuming for both patients and practitioners.    The Drug Enforcement Administration [facility]) published its intent to revise the process for transferring electronic prescriptions on July 19, 2020.  The final rule was published in the federal register on March 26, 2022 and went into effect 30 days later.  Under the final rule, a prescription can only be transferred once between pharmacies, and only if allowed under existing state or other applicable law. The prescription must remain in its electronic form; may not be altered in any way; and the transfer must be communicated directly between two licensed pharmacists. It's important to note, any authorized refills transfer with the original prescription, which means the entire prescription will be filled at the same  pharmacy."    REFERENCES: 1. DEA website announcement [email or web address]  2. Department of Justice website  [email or web address]  3. DEPARTMENT OF JUSTICE Drug Enforcement Administration 21 CFR Part 1306 [Docket No. [id number]] RIN [id number] "Transfer of Electronic Prescriptions for Schedules II-V Controlled Substances Between Pharmacies for Initial Filling"  ____________________________________________________________________________________________     _______________________________________________________________________  Medication Rules  Purpose: To inform patients, and their family members, of our medication rules and regulations.  Applies to: All patients receiving prescriptions from our practice (written or electronic).  Pharmacy of record: This is the pharmacy where your electronic prescriptions will be sent. Make sure we have the correct one.  Electronic prescriptions: In compliance with the [place] (STOP) Act of 2017 (Session Lanny Cramp [phone]), effective August 31, 2018, all controlled substances must be electronically prescribed. Written prescriptions, faxing, or calling prescriptions to a pharmacy will no longer be done.  Prescription refills: These will be provided only during in-person appointments. No medications will be renewed without a "face-to-face" evaluation with your provider. Applies to all prescriptions.  NOTE: The following applies primarily to controlled substances (Opioid* Pain Medications).   Type of encounter (visit): For patients receiving controlled substances, face-to-face visits are required. (Not an option and not up to the patient.)  Patient's responsibilities: Pain  Pills: Bring all pain pills to every appointment (except for procedure appointments). Pill  Bottles: Bring pills in original pharmacy bottle. Bring bottle, even if empty. Always bring the bottle of the most recent fill.  Medication refills: You are responsible for knowing and keeping track of what medications you are taking and when is it that you will need a refill. The day before your appointment: write a list of all prescriptions that need to be refilled. The day of the appointment: give the list to the admitting nurse. Prescriptions will be written only during appointments. No prescriptions will be written on procedure days. If you forget a medication: it will not be "Called in", "Faxed", or "electronically sent". You will need to get another appointment to get these prescribed. No early refills. Do not call asking to have your prescription filled early. Partial  or short prescriptions: Occasionally your pharmacy may not have enough pills to fill your prescription.  NEVER ACCEPT a partial fill or a prescription that is short of the total amount of pills that you were prescribed.  With controlled substances the law allows 72 hours for the pharmacy to complete the prescription.  If the prescription is not completed within 72 hours, the pharmacist will require a new prescription to be written. This means that you will be short on your medicine and we WILL NOT send another prescription to complete your original prescription.  Instead, request the pharmacy to send a carrier to a nearby branch to get enough medication to provide you with your full prescription. Prescription Accuracy: You are responsible for carefully inspecting your prescriptions before leaving our office. Have the discharge nurse carefully go over each prescription with you, before taking them home. Make sure that your name is accurately spelled, that your address is correct. Check the name and dose of your medication to make sure it is accurate. Check the number of pills, and the written instructions to make sure they are clear and  accurate. Make sure that you are given enough medication to last until your next medication refill appointment. Taking Medication: Take medication as prescribed. When it comes to controlled substances, taking less pills or less frequently than prescribed is permitted and encouraged. Never take more pills than instructed. Never take the medication more frequently than prescribed.  Inform other Doctors: Always inform, all of your healthcare providers, of all the medications you take. Pain Medication from other Providers: You are not allowed to accept any additional pain medication from any other Doctor or Healthcare provider. There are two exceptions to this rule. (see below) In the event that you require additional pain medication, you are responsible for notifying us, as stated below. Cough Medicine: Often these contain an opioid, such as codeine or hydrocodone. Never accept or take cough medicine containing these opioids if you are already taking an opioid* medication. The combination may cause respiratory failure and death. Medication Agreement: You are responsible for carefully reading and following our Medication Agreement. This must be signed before receiving any prescriptions from our practice. Safely store a copy of your signed Agreement. Violations to the Agreement will result in no further prescriptions. (Additional copies of our Medication Agreement are available upon request.) Laws, Rules, & Regulations: All patients are expected to follow all Federal and [employer], Rules, [employer]. Ignorance of the Laws does not constitute a valid excuse.  Illegal drugs and Controlled Substances: The use of illegal substances (including, but not limited to marijuana and its derivatives) and/or the illegal  use of any controlled substances is strictly prohibited. Violation of this rule may result in the immediate and permanent discontinuation of any and all prescriptions being written by our  practice. The use of any illegal substances is prohibited. Adopted CDC guidelines & recommendations: Target dosing levels will be at or below 60 MME/day. Use of benzodiazepines** is not recommended.  Exceptions: There are only two exceptions to the rule of not receiving pain medications from other Healthcare Providers. Exception #1 (Emergencies): In the event of an emergency (i.e.: accident requiring emergency care), you are allowed to receive additional pain medication. However, you are responsible for: As soon as you are able, call our office [phone], at any time of the day or night, and leave a message stating your name, the date and nature of the emergency, and the name and dose of the medication prescribed. In the event that your call is answered by a member of our staff, make sure to document and save the date, time, and the name of the person that took your information.  Exception #2 (Planned Surgery): In the event that you are scheduled by another doctor or dentist to have any type of surgery or procedure, you are allowed (for a period no longer than 30 days), to receive additional pain medication, for the acute post-op pain. However, in this case, you are responsible for picking up a copy of our "Post-op Pain Management for Surgeons" handout, and giving it to your surgeon or dentist. This document is available at our office, and does not require an appointment to obtain it. Simply go to our office during business hours (Monday-Thursday from 8:00 AM to 4:00 PM) (Friday 8:00 AM to 12:00 Noon) or if you have a scheduled appointment with [place], prior to your surgery, and ask for it by name. In addition, you are responsible for: calling our office [phone], at any time of the day or night, and leaving a message stating your name, name of your surgeon, type of surgery, and date of procedure or surgery. Failure to comply with your responsibilities may result in termination of therapy involving the  controlled substances. Medication Agreement Violation. Following the above rules, including your responsibilities will help you in avoiding a Medication Agreement Violation ("Breaking your Pain Medication Contract").  Consequences:  Not following the above rules may result in permanent discontinuation of medication prescription therapy.  *Opioid medications include: morphine, codeine, oxycodone, oxymorphone, hydrocodone, hydromorphone, meperidine, tramadol, tapentadol, buprenorphine, fentanyl, methadone. **Benzodiazepine medications include: diazepam (Valium), alprazolam (Xanax), clonazepam (Klonopine), lorazepam (Ativan), clorazepate (Tranxene), chlordiazepoxide (Librium), estazolam (Prosom), oxazepam (Serax), temazepam (Restoril), triazolam (Halcion) (Last updated: 06/23/2022) ______________________________________________________________________    ______________________________________________________________________  Medication Recommendations and Reminders  Applies to: All patients receiving prescriptions (written and/or electronic).  Medication Rules & Regulations: You are responsible for reading, knowing, and following our "Medication Rules" document. These exist for your safety and that of others. They are not flexible and neither are we. Dismissing or ignoring them is an act of "non-compliance" that may result in complete and irreversible termination of such medication therapy. For safety reasons, "non-compliance" will not be tolerated. As with the [place] fundamental legal principle of "ignorance of the law is no defense", we will accept no excuses for not having read and knowing the content of documents provided to you by our practice.  Pharmacy of record:  Definition: This is the pharmacy where your electronic prescriptions will be sent.  We do not endorse any particular pharmacy. It is up to you and  your insurance to decide what pharmacy to use.  We do not restrict you in your  choice of pharmacy. However, once we write for your prescriptions, we will NOT be re-sending more prescriptions to fix restricted supply problems created by your pharmacy, or your insurance.  The pharmacy listed in the electronic medical record should be the one where you want electronic prescriptions to be sent. If you choose to change pharmacy, simply notify our nursing staff. Changes will be made only during your regular appointments and not over the phone.  Recommendations: Keep all of your pain medications in a safe place, under lock and key, even if you live alone. We will NOT replace lost, stolen, or damaged medication. We do not accept "Police Reports" as proof of medications having been stolen. After you fill your prescription, take 1 week's worth of pills and put them away in a safe place. You should keep a separate, properly labeled bottle for this purpose. The remainder should be kept in the original bottle. Use this as your primary supply, until it runs out. Once it's gone, then you know that you have 1 week's worth of medicine, and it is time to come in for a prescription refill. If you do this correctly, it is unlikely that you will ever run out of medicine. To make sure that the above recommendation works, it is very important that you make sure your medication refill appointments are scheduled at least 1 week before you run out of medicine. To do this in an effective manner, make sure that you do not leave the office without scheduling your next medication management appointment. Always ask the nursing staff to show you in your prescription , when your medication will be running out. Then arrange for the receptionist to get you a return appointment, at least 7 days before you run out of medicine. Do not wait until you have 1 or 2 pills left, to come in. This is very poor planning and does not take into consideration that we may need to cancel appointments due to bad weather, sickness, or  emergencies affecting our staff. DO NOT ACCEPT A "Partial Fill": If for any reason your pharmacy does not have enough pills/tablets to completely fill or refill your prescription, do not allow for a "partial fill". The law allows the pharmacy to complete that prescription within 72 hours, without requiring a new prescription. If they do not fill the rest of your prescription within those 72 hours, you will need a separate prescription to fill the remaining amount, which we will NOT provide. If the reason for the partial fill is your insurance, you will need to talk to the pharmacist about payment alternatives for the remaining tablets, but again, DO NOT ACCEPT A PARTIAL FILL, unless you can trust your pharmacist to obtain the remainder of the pills within 72 hours.  Prescription refills and/or changes in medication(s):  Prescription refills, and/or changes in dose or medication, will be conducted only during scheduled medication management appointments. (Applies to both, written and electronic prescriptions.) No refills on procedure days. No medication will be changed or started on procedure days. No changes, adjustments, and/or refills will be conducted on a procedure day. Doing so will interfere with the diagnostic portion of the procedure. No phone refills. No medications will be "called into the pharmacy". No Fax refills. No weekend refills. No Holliday refills. No after hours refills.  Remember:  Business hours are:  Monday to Thursday 8:00 AM to 4:00 PM Provider's  Schedule: Milinda Pointer, MD - Appointments are:  Medication management: Monday and Wednesday 8:00 AM to 4:00 PM Procedure day: Tuesday and Thursday 7:30 AM to 4:00 PM Gillis Santa, MD - Appointments are:  Medication management: Tuesday and Thursday 8:00 AM to 4:00 PM Procedure day: Monday and Wednesday 7:30 AM to 4:00 PM (Last update: 06/23/2022) ______________________________________________________________________     ____________________________________________________________________________________________  Drug Holidays  What is a "Drug Holiday"? Drug Holiday: is the name given to the process of slowly tapering down and temporarily stopping the pain medication for the purpose of decreasing or eliminating tolerance to the drug.  Benefits Improved effectiveness Decreased required effective dose Improved pain control End dependence on high dose therapy Decrease cost of therapy Uncovering "opioid-induced hyperalgesia". (OIH)  What is "opioid hyperalgesia"? It is a paradoxical increase in pain caused by exposure to opioids. Stopping the opioid pain medication, contrary to the expected, it actually decreases or completely eliminates the pain. Ref.: "A comprehensive review of opioid-induced hyperalgesia". Brion Aliment, et.al. Pain Physician. 2011 Mar-Apr;14(2):145-61.  What is tolerance? Tolerance: the progressive loss of effectiveness of a pain medicine due to repetitive use. A common problem of opioid pain medications.  How long should a "Drug Holiday" last? Effectiveness depends on the patient staying off all opioid pain medicines for a minimum of 14 consecutive days. (2 weeks)  How about just taking less of the medicine? Does not work. Will not accomplish goal of eliminating the excess receptors.  How about switching to a different pain medicine? (AKA. "Opioid rotation") Does not work. Creates the illusion of effectiveness by taking advantage of inaccurate equivalent dose calculations between different opioids. -This "technique" was promoted by studies funded by [employer], such as [employer], creators of "OxyContin".  Can I stop the medicine "cold [place]"? Depends. You should always coordinate with your Pain Specialist to make the transition as smoothly as possible. Avoid stopping the medicine abruptly without consulting. We recommend a "slow taper".  What is a slow  taper? Taper: refers to the gradual decrease in dose.   How do I stop/taper the dose? Slowly. Decrease the daily amount of pills that you take by one (1) pill every seven (7) days. This is called a "slow downward taper". Example: if you normally take four (4) pills per day, drop it to three (3) pills per day for seven (7) days, then to two (2) pills per day for seven (7) days, then to one (1) per day for seven (7) days, and then stop the medicine. The 14 day "Drug Holiday" starts on the first day without medicine.   Will I experience withdrawals? Unlikely with a slow taper.  What triggers withdrawals? Withdrawals are triggered by the sudden/abrupt stop of high dose opioids. Withdrawals can be avoided by slowly decreasing the dose over a prolonged period of time.  What are withdrawals? Symptoms associated with sudden/abrupt reduction/stopping of high-dose, long-term use of pain medication. Withdrawal are seldom seen on low dose therapy, or patients rarely taking opioid medication.  Early Withdrawal Symptoms may include: Agitation Anxiety Muscle aches Increased tearing Insomnia Runny nose Sweating Yawning  Late symptoms may include: Abdominal cramping Diarrhea Dilated pupils Goose bumps Nausea Vomiting  (Last update: 08/09/2022) ____________________________________________________________________________________________    ____________________________________________________________________________________________  WARNING: CBD (cannabidiol) & Delta (Delta-8 tetrahydrocannabinol) products.   Applicable to:  All individuals currently taking or considering taking CBD (cannabidiol) and, more important, all patients taking opioid analgesic controlled substances (pain medication). (Example: oxycodone; oxymorphone; hydrocodone; hydromorphone; morphine; methadone; tramadol; tapentadol; fentanyl; buprenorphine;  butorphanol; dextromethorphan; meperidine; codeine; etc.)  Introduction:   Recently there has been a drive towards the use of "natural" products for the treatment of different conditions, including pain anxiety and sleep disorders. Marijuana and hemp are two varieties of the cannabis genus plants. Marijuana and its derivatives are illegal, while hemp and its derivatives are not. Cannabidiol (CBD) and tetrahydrocannabinol (THC), are two natural compounds found in plants of the Cannabis genus. They can both be extracted from hemp or marijuana. Both compounds interact with your body's endocannabinoid system in very different ways. CBD is associated with pain relief (analgesia) while THC is associated with the psychoactive effects ("the high") obtained from the use of marijuana products. There are two main types of THC: Delta-9, which comes from the marijuana plant and it is illegal, and Delta-8, which comes from the hemp plant, and it is legal. (Both, Delta-9-THC and Delta-8-THC are psychoactive and give you "the high".)   Legality:  Marijuana and its derivatives: illegal Hemp and its derivatives: Legal (State dependent) UPDATE: (10/17/2021) The Drug Enforcement Agency ([place]) issued a letter stating that "delta" cannabinoids, including Delta-8-THCO and Delta-9-THCO, synthetically derived from hemp do not qualify as hemp and will be viewed as Schedule I drugs. (Schedule I drugs, substances, or chemicals are defined as drugs with no currently accepted medical use and a high potential for abuse. Some examples of Schedule I drugs are: heroin, lysergic acid diethylamide (LSD), marijuana (cannabis), 3,4-methylenedioxymethamphetamine (ecstasy), methaqualone, and peyote.) (https://jennings.com/)  Legal status of CBD in [place]:  "Conditionally Legal"  Reference: "FDA Regulation of Cannabis and Cannabis-Derived Products, Including Cannabidiol (CBD)" - SeekArtists.com.pt  Warning:   CBD is not FDA approved and has not undergo the same manufacturing controls as prescription drugs.  This means that the purity and safety of available CBD may be questionable. Most of the time, despite manufacturer's claims, it is contaminated with THC (delta-9-tetrahydrocannabinol - the chemical in marijuana responsible for the "HIGH").  When this is the case, the [facility] contaminant will trigger a positive urine drug screen (UDS) test for Marijuana (carboxy-THC).   The FDA recently put out a warning about 5 things that everyone should be aware of regarding Delta-8 THC: Delta-8 THC products have not been evaluated or approved by the FDA for safe use and may be marketed in ways that put the public health at risk. The FDA has received adverse event reports involving delta-8 THC-containing products. Delta-8 THC has psychoactive and intoxicating effects. Delta-8 THC manufacturing often involve use of potentially harmful chemicals to create the concentrations of delta-8 THC claimed in the marketplace. The final delta-8 THC product may have potentially harmful by-products (contaminants) due to the chemicals used in the process. Manufacturing of delta-8 THC products may occur in uncontrolled or unsanitary settings, which may lead to the presence of unsafe contaminants or other potentially harmful substances. Delta-8 THC products should be kept out of the reach of children and pets.  NOTE: Because a positive UDS for any illicit substance is a violation of our medication agreement, your opioid analgesics (pain medicine) may be permanently discontinued.  MORE ABOUT CBD  General Information: CBD was discovered in [birth year] and it is a derivative of the cannabis sativa genus plants (Marijuana and Hemp). It is one of the 113 identified substances found in Marijuana. It accounts for up to 40% of the plant's extract. As of 2018, preliminary clinical studies on CBD included research for the treatment of anxiety, movement  disorders, and pain. CBD is available and consumed in multiple  forms, including inhalation of smoke or vapor, as an aerosol spray, and by mouth. It may be supplied as an oil containing CBD, capsules, dried cannabis, or as a liquid solution. CBD is thought not to be as psychoactive as THC (delta-9-tetrahydrocannabinol - the chemical in marijuana responsible for the "HIGH"). Studies suggest that CBD may interact with different biological target receptors in the body, including cannabinoid and other neurotransmitter receptors. As of 2018 the mechanism of action for its biological effects has not been determined.  Side-effects  Adverse reactions: Dry mouth, diarrhea, decreased appetite, fatigue, drowsiness, malaise, weakness, sleep disturbances, and others.  Drug interactions:  CBD may interact with medications such as blood-thinners. CBD causes drowsiness on its own and it will increase drowsiness caused by other medications, including antihistamines (such as Benadryl), benzodiazepines (Xanax, Ativan, Valium), antipsychotics, antidepressants, opioids, alcohol and supplements such as kava, melatonin and St. John's Wort.  Other drug interactions: Brivaracetam (Briviact); Caffeine; Carbamazepine (Tegretol); Citalopram (Celexa); Clobazam (Onfi); Eslicarbazepine (Aptiom); Everolimus (Zostress); Lithium; Methadone (Dolophine); Rufinamide (Banzel); Sedative medications (CNS depressants); Sirolimus (Rapamune); Stiripentol (Diacomit); Tacrolimus (Prograf); Tamoxifen ; Soltamox); Topiramate (Topamax); Valproate; Warfarin (Coumadin); Zonisamide. (Last update: 08/10/2022) ____________________________________________________________________________________________   ____________________________________________________________________________________________  Naloxone Nasal Spray  Why am I receiving this medication? [place] STOP ACT requires that all patients taking high dose opioids or at risk of opioids  respiratory depression, be prescribed an opioid reversal agent, such as Naloxone (AKA: Narcan).  What is this medication? NALOXONE (nal OX one) treats opioid overdose, which causes slow or shallow breathing, severe drowsiness, or trouble staying awake. Call emergency services after using this medication. You may need additional treatment. Naloxone works by reversing the effects of opioids. It belongs to a group of medications called opioid blockers.  COMMON BRAND NAME(S): Kloxxado, Narcan  What should I tell my care team before I take this medication? They need to know if you have any of these conditions: Heart disease Substance use disorder An unusual or allergic reaction to naloxone, other medications, foods, dyes, or preservatives Pregnant or trying to get pregnant Breast-feeding  When to use this medication? This medication is to be used for the treatment of respiratory depression (less than 8 breaths per minute) secondary to opioid overdose.   How to use this medication? This medication is for use in the nose. Lay the person on their back. Support their neck with your hand and allow the head to tilt back before giving the medication. The nasal spray should be given into 1 nostril. After giving the medication, move the person onto their side. Do not remove or test the nasal spray until ready to use. Get emergency medical help right away after giving the first dose of this medication, even if the person wakes up. You should be familiar with how to recognize the signs and symptoms of a narcotic overdose. If more doses are needed, give the additional dose in the other nostril. Talk to your care team about the use of this medication in children. While this medication may be prescribed for children as young as newborns for selected conditions, precautions do apply.  Naloxone Overdosage: If you think you have taken too much of this medicine contact a poison control center or emergency room at  once.  NOTE: This medicine is only for you. Do not share this medicine with others.  What if I miss a dose? This does not apply.  What may interact with this medication? This is only used during an emergency. No interactions are expected during  emergency use. This list may not describe all possible interactions. Give your health care provider a list of all the medicines, herbs, non-prescription drugs, or dietary supplements you use. Also tell them if you smoke, drink alcohol, or use illegal drugs. Some items may interact with your medicine.  What should I watch for while using this medication? Keep this medication ready for use in the case of an opioid overdose. Make sure that you have the phone number of your care team and local hospital ready. You may need to have additional doses of this medication. Each nasal spray contains a single dose. Some emergencies may require additional doses. After use, bring the treated person to the nearest hospital or call 911. Make sure the treating care team knows that the person has received a dose of this medication. You will receive additional instructions on what to do during and after use of this medication before an emergency occurs.  What side effects may I notice from receiving this medication? Side effects that you should report to your care team as soon as possible: Allergic reactions--skin rash, itching, hives, swelling of the face, lips, tongue, or throat Side effects that usually do not require medical attention (report these to your care team if they continue or are bothersome): Constipation Dryness or irritation inside the nose Headache Increase in blood pressure Muscle spasms Stuffy nose Toothache This list may not describe all possible side effects. Call your doctor for medical advice about side effects. You may report side effects to FDA at 1-800-FDA-1088.  Where should I keep my medication? Because this is an emergency medication, you  should keep it with you at all times.  Keep out of the reach of children and pets. Store between 20 and 25 degrees C (68 and 77 degrees F). Do not freeze. Throw away any unused medication after the expiration date. Keep in original box until ready to use.  NOTE: This sheet is a summary. It may not cover all possible information. If you have questions about this medicine, talk to your doctor, pharmacist, or health care provider.   2023 Elsevier/Gold Standard (2021-04-25 00:00:00)  ____________________________________________________________________________________________

## 2022-11-05 NOTE — Progress Notes (Signed)
Safety precautions to be maintained throughout the outpatient stay will include: orient to surroundings, keep bed in low position, maintain call bell within reach at all times, provide assistance with transfer out of bed and ambulation.  

## 2022-11-06 ENCOUNTER — Telehealth: Payer: Self-pay

## 2022-11-06 NOTE — Telephone Encounter (Signed)
Called post IT refill. No answer, Left message to call if needed.

## 2022-11-11 ENCOUNTER — Other Ambulatory Visit (HOSPITAL_BASED_OUTPATIENT_CLINIC_OR_DEPARTMENT_OTHER): Payer: Self-pay | Admitting: Pain Medicine

## 2022-11-11 DIAGNOSIS — E559 Vitamin D deficiency, unspecified: Secondary | ICD-10-CM

## 2022-11-11 LAB — COMP. METABOLIC PANEL (12)
AST: 21 IU/L (ref 0–40)
Albumin/Globulin Ratio: 2 (ref 1.2–2.2)
Albumin: 4.3 g/dL (ref 3.8–4.9)
Alkaline Phosphatase: 97 IU/L (ref 44–121)
BUN/Creatinine Ratio: 14 (ref 12–28)
BUN: 10 mg/dL (ref 8–27)
Bilirubin Total: 0.3 mg/dL (ref 0.0–1.2)
Calcium: 9.2 mg/dL (ref 8.7–10.3)
Chloride: 104 mmol/L (ref 96–106)
Creatinine, Ser: 0.73 mg/dL (ref 0.57–1.00)
Globulin, Total: 2.2 g/dL (ref 1.5–4.5)
Glucose: 117 mg/dL — ABNORMAL HIGH (ref 70–99)
Potassium: 4.1 mmol/L (ref 3.5–5.2)
Sodium: 142 mmol/L (ref 134–144)
Total Protein: 6.5 g/dL (ref 6.0–8.5)
eGFR: 94 mL/min/{1.73_m2} (ref >59)

## 2022-11-11 LAB — MAGNESIUM: Magnesium: 2.1 mg/dL (ref 1.6–2.3)

## 2022-11-11 LAB — 25-HYDROXY VITAMIN D LCMS D2+D3
25-Hydroxy, Vitamin D-2: <1 ng/mL
25-Hydroxy, Vitamin D-3: 19 ng/mL
25-Hydroxy, Vitamin D: 19 ng/mL — ABNORMAL LOW

## 2022-11-11 LAB — VITAMIN B1: Thiamine: 132.6 nmol/L (ref 66.5–200.0)

## 2022-11-11 LAB — C-REACTIVE PROTEIN: CRP: 4 mg/L (ref 0–10)

## 2022-11-11 LAB — VITAMIN B12: Vitamin B-12: 357 pg/mL (ref 232–1245)

## 2022-11-11 LAB — SEDIMENTATION RATE: Sed Rate: 21 mm/hr (ref 0–40)

## 2022-11-11 MED ORDER — ERGOCALCIFEROL 1.25 MG (50000 UT) PO CAPS: 50000.0000 [IU] | ORAL_CAPSULE | ORAL | 0 refills | Status: DC

## 2022-11-11 MED ORDER — VITAMIN D3 125 MCG (5000 UT) PO CAPS: 5000.0000 [IU] | ORAL_CAPSULE | Freq: Every day | ORAL | 0 refills | Status: DC

## 2022-11-11 NOTE — Progress Notes (Signed)
(  11/11/2022) labwork has uncovered a vitamin D deficiency.  Prescriptions sent to pharmacy.

## 2022-11-22 NOTE — Progress Notes (Unsigned)
PROVIDER NOTE: Information contained herein reflects review and annotations entered in association with encounter. Interpretation of such information and data should be left to medically-trained personnel. Information provided to patient can be located elsewhere in the medical record under "Patient Instructions". Document created using STT-dictation technology, any transcriptional errors that may result from process are unintentional.    Patient: Kendra Nelson  Service Category: E/M  Provider: Gaspar Cola, MD  DOB: 07/26/1961  DOS: 11/24/2022  Referring Provider: Berkley Harvey, NP  MRN: [id number]  Specialty: Interventional Pain Management  PCP: Berkley Harvey, NP  Type: Established Patient  Setting: Ambulatory outpatient    Location: Office  Delivery: Face-to-face     HPI  Kendra Nelson, a 62 y.o. year old female, is here today because of her Vitamin D deficiency [E55.9]. 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); Chronic low back pain (1ry area of Pain) (Bilateral) (R>L) w/ sciatica (Bilateral); Chronic lower extremity pain (2ry area of Pain) (Bilateral) (R>L); Neurogenic pain; Neuropathic pain; Chronic musculoskeletal pain; Lumbar facet syndrome (Bilateral) (R>L); Restless legs syndrome; DDD (degenerative disc disease), thoracic; Effusion of knee (Left); DDD (degenerative disc disease), lumbar; Lumbar intervertebral disc protrusion (L2-3); Degenerative arthritis of left knee; Primary osteoarthritis of left knee; Battery end of life of spinal cord stimulator; History of total knee replacement (Left); and Presence of neurostimulator on their pertinent problem  list. Pain Assessment: Severity of Chronic pain is reported as a 3 /10. Location: Back Lower/both legs to the ankles. Onset: More than a month ago. Quality: Burning, Throbbing. Timing: Constant. Modifying factor(s): hot showers, rest. Vitals:  height is 5\' 3"  (1.6 m) and weight is 165 lb (74.8 kg). Her temporal temperature is 97.8 F (36.6 C). Her blood pressure is 173/75 (abnormal) and her pulse is 63. Her respiration is 14 and oxygen saturation is 99%.  BMI: Estimated body mass index is 29.23 kg/m as calculated from the following:   Height as of this encounter: 5\' 3"  (1.6 m).   Weight as of this encounter: 165 lb (74.8 kg). Last encounter: 11/05/2022. Last procedure: 11/05/2022.  (Intrathecal pump refill)  Reason for encounter: follow-up evaluation after lab work.  Lab work as uncovered a significant vitamin D deficiency.  Medications have been sent to the pharmacy.  Previously the patient had complained about muscle spasms and cramping.  She had been provided with information on medications that she can use over-the-counter to address this issue.  On the last visit, we spoke to the patient about a possible upcoming "Drug Holiday".  At that time, compared to prior visits, she seems to be more receptive to the idea.  On prior occasions the patient would get visibly anxious anytime that I spoke to her about a drug holiday.  She is not only taking opioid analgesics through the intrathecal pump, but she is also using MS Contin 30 mg every 12 hours.  Over the years she has developed significant tolerance to opioids and she has side effects, the most prominent being in the opioid-induced constipation.  I believe that a very slow taper bring this patient's tolerance down to the point where we can improve on some of her side effects.  On the last visit we did provide  the patient with written information regarding the side effects of long-term opioid analgesic use.  All other than the opioid analgesics as she is  taking through the pump, she is also taking 60 MME's orally in the form of MS Contin 30 mg tablets twice daily.  The first step would probably be to switch her to oxycodone 40 mg/day (60 MME's), using a combination of the 5 and 10 mg pills to accomplish a slow taper of 5 mg/week.  With this type of taper it would take is 8 weeks to get to 0 mg orally.  Even at this level, the patient would still be receiving intrathecal fentanyl and not likely to be having any type of full withdrawal.  The patient currently has enough prescriptions in the pharmacy to last until 02/10/2023.  She is scheduled to return on 02/02/2023 for his medication management/pump refill.  Pharmacotherapy Assessment  Analgesic: Morphine (MS Contin) 30 mg PO BID (60 mg/day of morphine) (60 MME/day) +  PF-(intrathecal)-Fentanyl (13.4 mcg/hr)  changed from Oxycodone IR 10 mg 4x/day (40 mg/day of oxycodone) (60 MME/day), due to national shortage MME/day: 60 MME/day (oral) + 32.16 MME/day (intrathecal) = 93.16 MME/day (Aprox. 1.16 MME/day/kg).   Monitoring: [place] PMP: PDMP reviewed during this encounter.       Pharmacotherapy: No side-effects or adverse reactions reported. Compliance: No problems identified. Effectiveness: Clinically acceptable.  No notes on file  No results found for: "CBDTHCR" No results found for: "D8THCCBX" No results found for: "D9THCCBX"  UDS:  Summary  Date Value Ref Range Status  02/05/2022 Note  Final    Comment:    ==================================================================== [employer] Select 13 (MW) ==================================================================== Test                             Result       Flag       Units  Drug Present and Declared for Prescription Verification   Morphine                       [id number]       EXPECTED   ng/mg creat   Normorphine                    671          EXPECTED   ng/mg creat    Potential sources of large amounts of morphine in the absence of     codeine include administration of morphine or use of heroin.     Normorphine is an expected metabolite of morphine.    Fentanyl                       11           EXPECTED   ng/mg creat   Norfentanyl                    90           EXPECTED   ng/mg creat    Source of fentanyl is a scheduled prescription medication, including    IV, patch, and transmucosal formulations. Norfentanyl is an expected    metabolite of fentanyl.  ==================================================================== Test                      Result    Flag   Units      Ref Range   Creatinine  62               mg/dL      >=20 ==================================================================== Declared Medications:  The flagging and interpretation on this report are based on the  following declared medications.  Unexpected results may arise from  inaccuracies in the declared medications.   **Note: The testing scope of this panel includes these medications:   Fentanyl  Morphine (MS Contin)   **Note: The testing scope of this panel does not include the  following reported medications:   Atorvastatin (Lipitor)  Biotin  Bupivacaine  Calcium  Carvedilol (Coreg)  Furosemide (Lasix)  Lisinopril (Zestril)  Metoprolol (Lopressor)  Naloxone (Narcan)  Vitamin C  Vitamin D  Vitamin E ==================================================================== For clinical consultation, please call [phone]. ====================================================================       ROS  Constitutional: Denies any fever or chills Gastrointestinal: No reported hemesis, hematochezia, vomiting, or acute GI distress Musculoskeletal: Denies any acute onset joint swelling, redness, loss of ROM, or weakness Neurological: No reported episodes of acute onset apraxia, aphasia, dysarthria, agnosia, amnesia, paralysis, loss of coordination, or loss of consciousness  Medication Review  AMBULATORY NON  FORMULARY MEDICATION, atorvastatin, carvedilol, ergocalciferol, furosemide, lisinopril, morphine, and naloxone  History Review  Allergy: Kendra Nelson is allergic to benadryl [diphenhydramine hcl], bee venom, and dexamethasone. Drug: Kendra Nelson  reports current drug use. Drug: Oxycodone. Alcohol:  reports no history of alcohol use. Tobacco:  reports that she quit smoking about 4 years ago. Her smoking use included cigarettes. She has a 30.00 pack-year smoking history. She has never used smokeless tobacco. Social: Kendra Nelson  reports that she quit smoking about 4 years ago. Her smoking use included cigarettes. She has a 30.00 pack-year smoking history. She has never used smokeless tobacco. She reports current drug use. Drug: Oxycodone. She reports that she does not drink alcohol. Medical:  has a past medical history of Anxiety, Arthritis, Chronic lower back pain, Complication of anesthesia, Hyperlipidemia, Hypertension, Migraine, Neuromuscular disorder ([place]), Restless leg syndrome, Seasonal allergies, Skin abnormalities, and Wears glasses. Surgical: Kendra Nelson  has a past surgical history that includes Pain pump implantation (05/10/2012); Colonoscopy; Spinal cord stimulator battery exchange (N/A, 06/09/2016); Total knee arthroplasty (Left, 12/20/2017); Joint replacement; Back surgery; Vaginal hysterectomy; Total knee arthroplasty (Left, 12/20/2017); Intrathecal pump revision (N/A, 05/09/2019); Total knee revision (Left, 12/19/2020); and ATRIAL FIBRILLATION ABLATION (N/A, 06/13/2021). Family: family history includes Aortic aneurysm in her mother; Diabetes in her maternal grandmother.  Laboratory Chemistry Profile   Renal Lab Results  Component Value Date   BUN 10 11/05/2022   CREATININE 0.73 11/05/2022   LABCREA 121.6 10/30/2021   BCR 14 11/05/2022   GFRAA 76 03/23/2019   GFRNONAA >60 06/06/2021   LABVMA 2.2 11/05/2021   EPIRU 2 11/05/2021   EPINEPH24HUR 4 11/05/2021   NOREPRU 28  11/05/2021   NOREPI24HUR 50 11/05/2021   DOPARU 137 11/05/2021   DOPAM24HRUR 247 11/05/2021    Hepatic Lab Results  Component Value Date   AST 21 11/05/2022   ALT 29 06/06/2021   ALBUMIN 4.3 11/05/2022   ALKPHOS 97 11/05/2022   HCVAB NEG 10/13/2007    Electrolytes Lab Results  Component Value Date   NA 142 11/05/2022   K 4.1 11/05/2022   CL 104 11/05/2022   CALCIUM 9.2 11/05/2022   MG 2.1 11/05/2022   PHOS 2.8 12/20/2020    Bone Lab Results  Component Value Date   25OHVITD1 19 (L) 11/05/2022   25OHVITD2 <1.0 11/05/2022   25OHVITD3 19 11/05/2022  Inflammation (CRP: Acute Phase) (ESR: Chronic Phase) Lab Results  Component Value Date   CRP 4 11/05/2022   ESRSEDRATE 21 11/05/2022         Note: Above Lab results reviewed.  Recent Imaging Review  EP STUDY SURGEON:  Will Curt Bears, MD  PREPROCEDURE DIAGNOSES: 1. Paroxysmal atrial fibrillation. 2. Typical appearing atrial flutter  POSTPROCEDURE DIAGNOSES: 1. Paroxysmal  atrial fibrillation. 2. Typical appearing atrial flutter  PROCEDURES: 1. Comprehensive electrophysiologic study. 2. Coronary sinus pacing and recording. 3. Three-dimensional mapping of atrial fibrillation with additional  mapping and ablation of a second discrete focus (atrial flutter) 4. Ablation of atrial fibrillation with additional mapping and ablation of  a second discrete focus (atrial flutter) 5. Intracardiac echocardiography. 6. Transseptal puncture of an intact septum. 7. Arrhythmia induction with pacing with isuprel infusion  INTRODUCTION:  Kendra Nelson is a 62 y.o. female with a history of  paroxysmal atrial fibrillation and typical appearing atrial flutter who  now presents for EP study and radiofrequency ablation.  The patient  reports initially being diagnosed with atrial fibrillation after  presenting with symptomatic palpitations and fatgiue. The patient reports  increasing frequency and duration of atrial arrhythmias  since that time.   The patient has failed medical therapy with eliquis.  The patient  therefore presents today for catheter ablation of atrial fibrillation and  atrial flutter.  DESCRIPTION OF PROCEDURE:  Informed written consent was obtained, and the  patient was brought to the electrophysiology lab in a fasting state.  The  patient was adequately sedated with intravenous medications as outlined in  the anesthesia report.  The patient's left and right groins were prepped  and draped in the usual sterile fashion by the EP lab staff.  Using a  percutaneous Seldinger technique, two 8-French hemostasis sheaths were  placed in the right common femoral vein; one 7-French and one 11-French  hemostasis sheaths were placed into the left common femoral vein.  An  esophageal temperature probe was inserted to monitor for heating of the  esophagus during the procedure.  Direct ultrasound guidance is used for right and left femoral veins with  normal vessel patency. Ultrasound images are captured and stored in the  patient's chart. Using ultrasound guidance, the Brockenbrough needle and  wire were visualized entering the vessel.  Catheter Placement:  A 7-French Biosense Webster Decapolar coronary sinus catheter was  introduced through the right common femoral vein and advanced into the  coronary sinus for recording and pacing from this location.    A luminal esophageal temperature probe was placed and used for continuous  monitoring of the luminal esophageal temperature throughout the procedure  as well as to localize the esophagus on fluoroscopy. In addition, the  esophagus was directly visualized with intracardiac echo and its  positioned marked on Carto.  During ablation at the posterior wall there  was limited esophageal heating noted during RF energy delivery with the  maximal temperature recorded by the luminal temperature probe of < 38.5  degrees C.  Initial Measurements: The patient  presented to the electrophysiology lab in sinus rhythm.  The  patients PR interval measured 151 msec with a QRS duration of 105 msec and  a QT interval of 436 msec.    Intracardiac Echocardiography: A 10-French Biosense Webster AcuNav intracardiac echocardiography catheter  was introduced through the right common femoral vein and advanced into the  right atrium. Intracardiac echocardiography was performed of the left  atrium, and a three-dimensional anatomical rendering of the  left atrium  was performed using CARTO sound technology.  The patient was noted to have  a moderate sized left atrium.  The interatrial septum was prominent but  not aneurysmal. All 4 pulmonary veins were visualized and noted to have  separate ostia.  The pulmonary veins were moderate in size.  The left  atrial appendage was visualized and did not reveal thrombus.   There was  no evidence of pulmonary vein stenosis.   Transseptal Puncture: The right common femoral vein sheaths were was exchanged for an 8.5 [place]  Agillis transseptal sheath and transseptal access was achieved in a  standard fashion using a Brockenbrough needle under biplane fluoroscopy  with intracardiac echocardiography confirmation of the transseptal  puncture.  Once transseptal access had been achieved, heparin was  administered intravenously and intra- arterially in order to maintain an  ACT of greater than 350 seconds throughout the procedure.   3D Mapping and Ablation: A 3.5 mm Biosense Webster SmartTouch Thermocool ablation catheter was  advanced into the right atrium.  The transseptal sheath was pulled back  into the IVC over a guidewire.  The ablation catheter was advanced across  the transseptal hole using the wire as a guide.  The transseptal sheath  was then re-advanced over the guidewire into the left atrium.  A  duodecapolar Biosense Webster pentarray mapping catheter was introduced  through the transseptal sheath and positioned  over the mouth of all 4  pulmonary veins.  Three-dimensional electroanatomical mapping was  performed using CARTO technology.  This demonstrated electrical activity  within all four pulmonary veins at baseline. The patient underwent  successful sequential electrical isolation and anatomical encircling of  all four pulmonary veins using radiofrequency current with a circular  mapping catheter as a guide.  A WACA approach was used.  Entrance and exit  block were achieved. Following ablation, dobutamine was infused up to 20  mcg/min with no inducible atrial fibrillation, atrial tachycardia, atrial  flutter, or sustained PACs. The ablation catheter was then pulled back into the right atrial and  positioned along the cavo-tricuspid isthmus.  Mapping along the atrial  side of the isthmus was performed.  This demonstrated a standard isthmus.   A series of radiofrequency applications were then delivered along the  isthmus.  Complete bidirectional cavotricuspid isthmus block was achieved  as confirmed by differential atrial pacing from the low lateral right  atrium.  A stimulus to earliest atrial activation across the isthmus  measured 150 msec bi-directionally.  The patient was observe without  return of conduction through the isthmus.  Measurements Following Ablation:  In sinus rhythm with RR interval was 889 msec, with PR 147 msec, QRS 105  msec, and QT 452 msec.  Following ablation the AH interval measured 70  msec with an HV interval of 54 msec. Ventricular pacing was performed,  which revealed midline concentric decremental VA conduction with a VA WCL  of 300 msec.  Rapid atrial pacing was performed, which revealed an AV Wenckebach cycle  length of 220 msec.  Electroisolation was then again confirmed in all four  pulmonary veins.  Intracardiac echocardiography was again performed, which revealed no  pericardial effusion.  The procedure was therefore considered completed.   All catheters  were removed, and the sheaths were aspirated and flushed.   The patient was transferred to the recovery area for sheath removal per  protocol.  Intracardiac echocardiogram revealed no pericardial effusion.  EBL<4ml.  There were no early apparent complications.  CONCLUSIONS: 1.  Sinus rhythm upon presentation.   2. Successful electrical isolation and anatomical encircling of all four  pulmonary veins with radiofrequency current.    3. Cavo-tricuspid isthmus ablation was performed with complete  bidirectional isthmus block achieved.  4. No inducible arrhythmias following ablation both on and off of  dobutamine 5. No early apparent complications.  Will Camnitz,MD 9:34 AM 06/13/2021 Note: Reviewed        Physical Exam  General appearance: Well nourished, well developed, and well hydrated. In no apparent acute distress Mental status: Alert, oriented x 3 (person, place, & time)       Respiratory: No evidence of acute respiratory distress Eyes: PERLA Vitals: BP (!) 173/75   Pulse 63   Temp 97.8 F (36.6 C) (Temporal)   Resp 14   Ht 5\' 3"  (1.6 m)   Wt 165 lb (74.8 kg)   SpO2 99%   BMI 29.23 kg/m  BMI: Estimated body mass index is 29.23 kg/m as calculated from the following:   Height as of this encounter: 5\' 3"  (1.6 m).   Weight as of this encounter: 165 lb (74.8 kg). Ideal: Ideal body weight: 52.4 kg (115 lb 8.3 oz) Adjusted ideal body weight: 61.4 kg (135 lb 5 oz)  Assessment   Diagnosis Status  1. Vitamin D deficiency   2. Chronic pain syndrome   3. Chronic low back pain (1ry area of Pain) (Bilateral) (R>L) w/ sciatica (Bilateral)   4. Chronic back pain greater than 3 months duration   5. Chronic lower extremity pain (2ry area of Pain) (Bilateral) (R>L)   6. Chronic lumbosacral radiculopathy (L5) (Left)   7. 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 Presence of intrathecal pump   9. Presence of neurostimulator   10. Drug tolerance, sequela    11. Physical tolerance to opiate drug   12. Opiate use (93.16 MME/day)   13. Opioid-induced constipation (OIC)   14. Uncomplicated opioid dependence (Kendra Nelson)    Recently diagnosed Controlled Controlled   Updated Problems: No problems updated.   Plan of Care  Problem-specific:  No problem-specific Assessment & Plan notes found for this encounter.  Kendra Nelson has a current medication list which includes the following long-term medication(s): atorvastatin, carvedilol, ergocalciferol, lisinopril, morphine, [START ON 12/12/2022] morphine, and [START ON 01/11/2023] morphine.  Pharmacotherapy (Medications Ordered): No orders of the defined types were placed in this encounter.  Orders:  No orders of the defined types were placed in this encounter.  Follow-up plan:   Return for scheduled encounter.      Interventional Therapies  Risk Factors  Considerations:   ELIQUIS Anticoagulation (Stop: 3 days  Restart: 6 hours) Severe anxiety and agoraphobia  Poor candidate for RFA secondary to Lumbar hardware   Planned  Pending:   Intrathecal pump refill as per pump programming.   Under consideration:   Diagnostic left IA knee joint inj  Diagnostic bilateral L2 TFESI  Diagnostic left genicular NB  Possible left genicular nerve RFA  Diagnostic caudal ESI + epidurogram  Possible Racz procedure    Completed:   Palliative intrathecal pump refills and adjustments  Intrathecal pump insertion by Dr. Erline Levine [facility]) (05/10/2012)  Intrathecal pump replacement by Dr. Clydell Hakim [facility]) (05/09/2019)  Spinal cord stimulator replacement by Dr. Erline Levine [facility]) (06/09/2016)    Therapeutic  Palliative (PRN) options:   Therapeutic/palliative intrathecal pump  management (analysis and refill)          Recent  Visits Date Type Provider Dept  11/24/22 Office Visit Milinda Pointer, MD [facility]  11/05/22 Procedure  visit Milinda Pointer, MD [facility]  Showing recent visits within past 90 days and meeting all other requirements Future Appointments Date Type Provider Dept  02/02/23 Appointment Milinda Pointer, MD [facility]  Showing future appointments within next 90 days and meeting all other requirements  I discussed the assessment and treatment plan with the patient. The patient was provided an opportunity to ask questions and all were answered. The patient agreed with the plan and demonstrated an understanding of the instructions.  Patient advised to call back or seek an in-person evaluation if the symptoms or condition worsens.  Duration of encounter: 30 minutes.  Total time on encounter, as per AMA guidelines included both the face-to-face and non-face-to-face time personally spent by the physician and/or other qualified health care professional(s) on the day of the encounter (includes time in activities that require the physician or other qualified health care professional and does not include time in activities normally performed by clinical staff). Physician's time may include the following activities when performed: Preparing to see the patient (e.g., pre-charting review of records, searching for previously ordered imaging, lab work, and nerve conduction tests) Review of prior analgesic pharmacotherapies. Reviewing PMP Interpreting ordered tests (e.g., lab work, imaging, nerve conduction tests) Performing post-procedure evaluations, including interpretation of diagnostic procedures Obtaining and/or reviewing separately obtained history Performing a medically appropriate examination and/or evaluation Counseling and educating the patient/family/caregiver Ordering medications, tests, or procedures Referring and communicating with other health care professionals (when not separately reported) Documenting clinical information in the electronic or other health  record Independently interpreting results (not separately reported) and communicating results to the patient/ family/caregiver Care coordination (not separately reported)  Note by: Gaspar Cola, MD Date: 11/24/2022; Time: 8:03 AM

## 2022-11-24 ENCOUNTER — Encounter: Payer: Self-pay | Admitting: Pain Medicine

## 2022-11-24 ENCOUNTER — Ambulatory Visit: Payer: Medicare HMO | Attending: Pain Medicine | Admitting: Pain Medicine

## 2022-11-24 VITALS — BP 173/75 | HR 63 | Temp 97.8°F | Resp 14 | Ht 63.0 in | Wt 165.0 lb

## 2022-11-24 DIAGNOSIS — G8929 Other chronic pain: Secondary | ICD-10-CM | POA: Diagnosis not present

## 2022-11-24 DIAGNOSIS — F1120 Opioid dependence, uncomplicated: Secondary | ICD-10-CM | POA: Diagnosis not present

## 2022-11-24 DIAGNOSIS — K5903 Drug induced constipation: Secondary | ICD-10-CM | POA: Insufficient documentation

## 2022-11-24 DIAGNOSIS — Z978 Presence of other specified devices: Secondary | ICD-10-CM | POA: Diagnosis not present

## 2022-11-24 DIAGNOSIS — Z9682 Presence of neurostimulator: Secondary | ICD-10-CM | POA: Insufficient documentation

## 2022-11-24 DIAGNOSIS — T402X5A Adverse effect of other opioids, initial encounter: Secondary | ICD-10-CM | POA: Diagnosis not present

## 2022-11-24 DIAGNOSIS — G894 Chronic pain syndrome: Secondary | ICD-10-CM | POA: Diagnosis not present

## 2022-11-24 DIAGNOSIS — M5417 Radiculopathy, lumbosacral region: Secondary | ICD-10-CM | POA: Insufficient documentation

## 2022-11-24 DIAGNOSIS — M5441 Lumbago with sciatica, right side: Secondary | ICD-10-CM | POA: Insufficient documentation

## 2022-11-24 DIAGNOSIS — M79604 Pain in right leg: Secondary | ICD-10-CM | POA: Insufficient documentation

## 2022-11-24 DIAGNOSIS — T50905S Adverse effect of unspecified drugs, medicaments and biological substances, sequela: Secondary | ICD-10-CM | POA: Diagnosis not present

## 2022-11-24 DIAGNOSIS — E559 Vitamin D deficiency, unspecified: Secondary | ICD-10-CM | POA: Diagnosis not present

## 2022-11-24 DIAGNOSIS — M79605 Pain in left leg: Secondary | ICD-10-CM | POA: Insufficient documentation

## 2022-11-24 DIAGNOSIS — M5442 Lumbago with sciatica, left side: Secondary | ICD-10-CM | POA: Insufficient documentation

## 2022-11-24 DIAGNOSIS — R69 Illness, unspecified: Secondary | ICD-10-CM | POA: Diagnosis not present

## 2022-11-24 DIAGNOSIS — M961 Postlaminectomy syndrome, not elsewhere classified: Secondary | ICD-10-CM | POA: Insufficient documentation

## 2022-11-24 DIAGNOSIS — F1190 Opioid use, unspecified, uncomplicated: Secondary | ICD-10-CM | POA: Diagnosis not present

## 2022-11-24 DIAGNOSIS — M549 Dorsalgia, unspecified: Secondary | ICD-10-CM | POA: Diagnosis not present

## 2022-11-24 MED FILL — Medication: INTRATHECAL | Qty: 1 | Status: CN

## 2022-11-24 MED FILL — Medication: INTRATHECAL | Qty: 1 | Status: AC

## 2022-11-24 NOTE — Patient Instructions (Signed)
____________________________________________________________________________________________  Drug Holidays  What is a "Drug Holiday"? Drug Holiday: is the name given to the process of slowly tapering down and temporarily stopping the pain medication for the purpose of decreasing or eliminating tolerance to the drug.  Benefits Improved effectiveness Decreased required effective dose Improved pain control End dependence on high dose therapy Decrease cost of therapy Uncovering "opioid-induced hyperalgesia". (OIH)  What is "opioid hyperalgesia"? It is a paradoxical increase in pain caused by exposure to opioids. Stopping the opioid pain medication, contrary to the expected, it actually decreases or completely eliminates the pain. Ref.: "A comprehensive review of opioid-induced hyperalgesia". Brion Aliment, et.al. Pain Physician. 2011 Mar-Apr;14(2):145-61.  What is tolerance? Tolerance: the progressive loss of effectiveness of a pain medicine due to repetitive use. A common problem of opioid pain medications.  How long should a "Drug Holiday" last? Effectiveness depends on the patient staying off all opioid pain medicines for a minimum of 14 consecutive days. (2 weeks)  How about just taking less of the medicine? Does not work. Will not accomplish goal of eliminating the excess receptors.  How about switching to a different pain medicine? (AKA. "Opioid rotation") Does not work. Creates the illusion of effectiveness by taking advantage of inaccurate equivalent dose calculations between different opioids. -This "technique" was promoted by studies funded by [employer], such as [employer], creators of "OxyContin".  Can I stop the medicine "cold [place]"? We do not recommend it. You should always coordinate with your prescribing physician to make the transition as smoothly as possible. Avoid stopping the medicine abruptly without consulting. We recommend a "slow taper".  What  is a slow taper? Taper: refers to the gradual decrease in dose.   How do I stop/taper the dose? Slowly. Decrease the daily amount of pills that you take by one (1) pill every seven (7) days. This is called a "slow downward taper". Example: if you normally take four (4) pills per day, drop it to three (3) pills per day for seven (7) days, then to two (2) pills per day for seven (7) days, then to one (1) per day for seven (7) days, and then stop the medicine. The 14 day "Drug Holiday" starts on the first day without medicine.   Will I experience withdrawals? Unlikely with a slow taper.  What triggers withdrawals? Withdrawals are triggered by the sudden/abrupt stop of high dose opioids. Withdrawals can be avoided by slowly decreasing the dose over a prolonged period of time.  What are withdrawals? Symptoms associated with sudden/abrupt reduction/stopping of high-dose, long-term use of pain medication. Withdrawal are seldom seen on low dose therapy, or patients rarely taking opioid medication.  Early Withdrawal Symptoms may include: Agitation Anxiety Muscle aches Increased tearing Insomnia Runny nose Sweating Yawning  Late symptoms may include: Abdominal cramping Diarrhea Dilated pupils Goose bumps Nausea Vomiting  When could I see withdrawals? Onset: 8-24 hours after last use for most opioids. 12-48 hours for long-acting opioids (i.e.: methadone)  How long could they last? Duration: 4-10 days for most opioids. 14-21 days for long-acting opioids (i.e.: methadone)  What will happen after I complete my "Drug Holiday"? The need and indications for the opioid analgesic will be reviewed before restarting the medication. Dose requirements will likely decrease and the dose will need to be adjusted accordingly.   (Last update: 11/18/2022) ____________________________________________________________________________________________     ____________________________________________________________________________________________  Opioid Pain Medication Update  To: All patients taking opioid pain medications. (I.e.: hydrocodone, hydromorphone, oxycodone, oxymorphone, morphine, codeine, methadone, tapentadol, tramadol,  buprenorphine, fentanyl, etc.)  Re: Updated review of side effects and adverse reactions of opioid analgesics, as well as new information about long term effects of this class of medications.  Direct risks of long-term opioid therapy are not limited to opioid addiction and overdose. Potential medical risks include serious fractures, breathing problems during sleep, hyperalgesia, immunosuppression, chronic constipation, bowel obstruction, myocardial infarction, and tooth decay secondary to xerostomia.  Unpredictable adverse effects that can occur even if you take your medication correctly: Cognitive impairment, respiratory depression, and death. Most people think that if they take their medication "correctly", and "as instructed", that they will be safe. Nothing could be farther from the truth. In reality, a significant amount of recorded deaths associated with the use of opioids has occurred in individuals that had taken the medication for a long time, and were taking their medication correctly. The following are examples of how this can happen: Patient taking his/her medication for a long time, as instructed, without any side effects, is given a certain antibiotic or another unrelated medication, which in turn triggers a "Drug-to-drug interaction" leading to disorientation, cognitive impairment, impaired reflexes, respiratory depression or an untoward event leading to serious bodily harm or injury, including death.  Patient taking his/her medication for a long time, as instructed, without any side effects, develops an acute impairment of liver and/or kidney function. This will lead to a rapid inability of the body to  breakdown and eliminate their pain medication, which will result in effects similar to an "overdose", but with the same medicine and dose that they had always taken. This again may lead to disorientation, cognitive impairment, impaired reflexes, respiratory depression or an untoward event leading to serious bodily harm or injury, including death.  A similar problem will occur with patients as they grow older and their liver and kidney function begins to decrease as part of the aging process.  Background information: Historically, the original case for using long-term opioid therapy to treat chronic noncancer pain was based on safety assumptions that subsequent experience has called into question. In 1996, the American Pain Society and the [place] [employer] issued a consensus statement supporting long-term opioid therapy. This statement acknowledged the dangers of opioid prescribing but concluded that the risk for addiction was low; respiratory depression induced by opioids was short-lived, occurred mainly in opioid-naive patients, and was antagonized by pain; tolerance was not a common problem; and efforts to control diversion should not constrain opioid prescribing. This has now proven to be wrong. Experience regarding the risks for opioid addiction, misuse, and overdose in community practice has failed to support these assumptions.  According to the Centers for Disease Control and Prevention, fatal overdoses involving opioid analgesics have increased sharply over the past decade. Currently, more than 96,700 people die from drug overdoses every year. Opioids are a factor in 7 out of every 10 overdose deaths. Deaths from drug overdose have surpassed motor vehicle accidents as the leading cause of death for individuals between the ages of 32 and 29.  Clinical data suggest that neuroendocrine dysfunction may be very common in both men and women, potentially causing hypogonadism, erectile  dysfunction, infertility, decreased libido, osteoporosis, and depression. Recent studies linked higher opioid dose to increased opioid-related mortality. Controlled observational studies reported that long-term opioid therapy may be associated with increased risk for cardiovascular events. Subsequent meta-analysis concluded that the safety of long-term opioid therapy in elderly patients has not been proven.   Side Effects and adverse reactions: Common side effects:  Drowsiness (sedation). Dizziness. Nausea and vomiting. Constipation. Physical dependence -- Dependence often manifests with withdrawal symptoms when opioids are discontinued or decreased. Tolerance -- As you take repeated doses of opioids, you require increased medication to experience the same effect of pain relief. Respiratory depression -- This can occur in healthy people, especially with higher doses. However, people with COPD, asthma or other lung conditions may be even more susceptible to fatal respiratory impairment.  Uncommon side effects: An increased sensitivity to feeling pain and extreme response to pain (hyperalgesia). Chronic use of opioids can lead to this. Delayed gastric emptying (the process by which the contents of your stomach are moved into your small intestine). Muscle rigidity. Immune system and hormonal dysfunction. Quick, involuntary muscle jerks (myoclonus). Arrhythmia. Itchy skin (pruritus). Dry mouth (xerostomia).  Long-term side effects: Chronic constipation. Sleep-disordered breathing (SDB). Increased risk of bone fractures. Hypothalamic-pituitary-adrenal dysregulation. Increased risk of overdose.  RISKS: Fractures and Falls:  Opioids increase the risk and incidence of falls. This is of particular importance in elderly patients.  Endocrine System:  Long-term administration is associated with endocrine abnormalities (endocrinopathies). (Also known as Opioid-induced Endocrinopathy) Influences  on both the hypothalamic-pituitary-adrenal axis?and the hypothalamic-pituitary-gonadal axis have been demonstrated with consequent hypogonadism and adrenal insufficiency in both sexes. Hypogonadism and decreased levels of dehydroepiandrosterone sulfate have been reported in men and women. Endocrine effects include: Amenorrhoea in women (abnormal absence of menstruation) Reduced libido in both sexes Decreased sexual function Erectile dysfunction in men Hypogonadisms (decreased testicular function with shrinkage of testicles) Infertility Depression and fatigue Loss of muscle mass Anxiety Depression Immune suppression Hyperalgesia Weight gain Anemia Osteoporosis Patients (particularly women of childbearing age) should avoid opioids. There is insufficient evidence to recommend routine monitoring of asymptomatic patients taking opioids in the long-term for hormonal deficiencies.  Immune System: Human studies have demonstrated that opioids have an immunomodulating effect. These effects are mediated via opioid receptors both on immune effector cells and in the central nervous system. Opioids have been demonstrated to have adverse effects on antimicrobial response and anti-tumour surveillance. Buprenorphine has been demonstrated to have no impact on immune function.  Opioid Induced Hyperalgesia: Human studies have demonstrated that prolonged use of opioids can lead to a state of abnormal pain sensitivity, sometimes called opioid induced hyperalgesia (OIH). Opioid induced hyperalgesia is not usually seen in the absence of tolerance to opioid analgesia. Clinically, hyperalgesia may be diagnosed if the patient on long-term opioid therapy presents with increased pain. This might be qualitatively and anatomically distinct from pain related to disease progression or to breakthrough pain resulting from development of opioid tolerance. Pain associated with hyperalgesia tends to be more diffuse than the  pre-existing pain and less defined in quality. Management of opioid induced hyperalgesia requires opioid dose reduction.  Cancer: Chronic opioid therapy has been associated with an increased risk of cancer among noncancer patients with chronic pain. This association was more evident in chronic strong opioid users. Chronic opioid consumption causes significant pathological changes in the small intestine and colon. Epidemiological studies have found that there is a link between opium dependence and initiation of gastrointestinal cancers. Cancer is the second leading cause of death after cardiovascular disease. Chronic use of opioids can cause multiple conditions such as GERD, immunosuppression and renal damage as well as carcinogenic effects, which are associated with the incidence of cancers.   Mortality: Long-term opioid use has been associated with increased mortality among patients with chronic non-cancer pain (CNCP).  Prescription of long-acting opioids for chronic noncancer pain was associated  with a significantly increased risk of all-cause mortality, including deaths from causes other than overdose.  Reference: Von Korff M, Kolodny A, Deyo RA, Chou R. Long-term opioid therapy reconsidered. Ann Intern Med. 2011 Sep 6;155(5):325-8. doi: 10.7326/[id number]. PMID: [id number]; PMCIDXX:1631110. Morley Kos, Hayward RA, Dunn KM, [place] KP. Risk of adverse events in patients prescribed long-term opioids: A cohort study in the [place] [facility] [employer]. Eur J Pain. 2019 May;23(5):908-922. doi: 10.1002/ejp.1357. Epub 2019 Jan 31. PMID: [id number]. Colameco S, Coren JS, Ciervo CA. Continuous opioid treatment for chronic noncancer pain: a time for moderation in prescribing. Postgrad Med. 2009 Jul;121(4):61-6. doi: 10.3810/pgm.[id number]. PMID: [id number]. Heywood Bene RN, Primera SD, Blazina I, Rosalio Loud, Bougatsos C, Deyo RA. The  effectiveness and risks of long-term opioid therapy for chronic pain: a systematic review for a [employer] of [employer]ways to [employer]. Ann Intern Med. 2015 Feb 17;162(4):276-86. doi: [id number]. PMID: [id number]. Marjory Sneddon [facility], Makuc DM. NCHS Data Brief No. 22. [place]: Centers for Disease Control and Prevention; 2009. Sep, Increase in Fatal Poisonings Involving Opioid Analgesics in the [place], 1999-2006. Song IA, Choi HR, Oh TK. Long-term opioid use and mortality in patients with chronic non-cancer pain: Ten-year follow-up study in [place] from 2010 through 2019. EClinicalMedicine. 2022 Jul 18;51:101558. doi: [id number]/[id number]. PMID: [id number]; PMCIDOX:8550940. Huser, W., Schubert, T., Vogelmann, T. et al. All-cause mortality in patients with long-term opioid therapy compared with non-opioid analgesics for chronic non-cancer pain: a database study. [place] Med 18, 162 (2020). [email or web address] Rashidian H, Roxy Cedar, Malekzadeh R, Haghdoost AA. An Ecological Study of the Association between Opiate Use and Incidence of Cancers. Addict Health. 2016 Fall;8(4):252-260. PMID: [id number]; PMCIDQI:9185013.  Our Goal: Our goal is to control your pain with means other than the use of opioid pain medications.  Our Recommendation: Talk to your physician about coming off of these medications. We can assist you with the tapering down and stopping these medicines. Based on the new information, even if you cannot completely stop the medication, a decrease in the dose may be associated with a lesser risk. Ask for other means of controlling the pain. Decrease or eliminate those factors that significantly contribute to your pain such as smoking, obesity, and a diet heavily tilted towards "inflammatory" nutrients.  Last Updated: 10/28/2022    ____________________________________________________________________________________________

## 2022-11-25 DIAGNOSIS — J209 Acute bronchitis, unspecified: Secondary | ICD-10-CM | POA: Diagnosis not present

## 2022-11-25 DIAGNOSIS — R062 Wheezing: Secondary | ICD-10-CM | POA: Diagnosis not present

## 2022-11-25 DIAGNOSIS — R051 Acute cough: Secondary | ICD-10-CM | POA: Diagnosis not present

## 2022-12-17 ENCOUNTER — Other Ambulatory Visit: Payer: Self-pay

## 2022-12-17 MED ORDER — LISINOPRIL 20 MG PO TABS: 40.0000 mg | ORAL_TABLET | ORAL | 1 refills | Status: DC

## 2022-12-17 NOTE — Telephone Encounter (Signed)
Pt's medication was sent to pt's pharmacy as requested. Confirmation received.  °

## 2023-01-27 ENCOUNTER — Other Ambulatory Visit: Payer: Self-pay

## 2023-01-27 MED ORDER — PAIN MANAGEMENT IT PUMP REFILL: 1.0000 | Freq: Once | INTRATHECAL | 0 refills | Status: AC

## 2023-02-01 NOTE — Progress Notes (Unsigned)
PROVIDER NOTE: Interpretation of information contained herein should be left to medically-trained personnel. Specific patient instructions are provided elsewhere under "Patient Instructions" section of medical record. This document was created in part using STT-dictation technology, any transcriptional errors that may result from this process are unintentional.  Patient: Kendra Nelson Type: Established DOB: 1960/12/28 MRN: [id number] PCP: Iona Hansen, NP  Service: Procedure DOS: 02/02/2023 Setting: Ambulatory Location: Ambulatory outpatient facility Delivery: Face-to-face Provider: Oswaldo Done, MD Specialty: Interventional Pain Management Specialty designation: 09 Location: Outpatient facility Ref. Prov.: Iona Hansen, NP       Interventional Therapy   Primary Reason for Visit: Interventional Pain Management Treatment. CC: No chief complaint on file.  Procedure:          Type: Management of Intrathecal Drug Delivery System (IDDS) - Reservoir Refill (40981). No rate change.  Indications: 1. Chronic pain syndrome   2. Chronic low back pain (1ry area of Pain) (Bilateral) (R>L) w/ sciatica (Bilateral)   3. Chronic lower extremity pain (2ry area of Pain) (Bilateral) (R>L)   4. 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 DDD (degenerative disc disease), lumbar   6. Lumbar facet syndrome (Bilateral) (R>L)   7. Chronic lumbosacral radiculopathy (L5) (Left)   8. Presence of neurostimulator   9. Presence of intrathecal pump   10. Pharmacologic therapy   11. Long term prescription opiate use   12. Chronic use of opiate for therapeutic purpose   13. Encounter for medication management   14. Encounter for chronic pain management   15. Encounter for adjustment and management of infusion pump    Pain Assessment: Self-Reported Pain Score:  /10             Reported level is compatible with observation.         Intrathecal Drug Delivery System (IDDS)   Pump Device:  Manufacturer: Medtronic Model: Synchromed II Model No.: [id number] Serial No.: [id number] Delivery Route: Intrathecal Type: Programmable  Volume (mL): 40 mL reservoir Priming Volume: n/a  Calibration Constant: 120.0  MRI compatibility: Conditional   Implant Details:  Date: 05/09/2019 Implanter: Odette Fraction, MD  Contact Information: [facility] Last Revision/Replacement: 05/09/2019 Estimated Replacement Date: May/2027  Implant Site: Abdominal Laterality: Left  Catheter: Manufacturer: Medtronic Model:  (two piece) Model No.: [id number]  Serial No.: n/a  Implanted Length (cm): 38.1  Catheter Volume (mL): 0.214  Tip Location (Level): T6-7 Canal Access Site: n/a  Drug content:  Primary Medication Class: Opioid  Medication: PF-Fentanyl  Concentration: 1,000 mcg/mL   Secondary Medication Class: Local Anesthetic  Medication: PF-Bupivacaine  Concentration: 20.0 mg/mL   Tertiary Medication Class: none   PA parameters (PCA-mode):  Mode: Off (Inactive)  Programming:  Type: Simple continuous.  Medication, Concentration, Infusion Program, & Delivery Rate: For up-to-date details please see most recent scanned programming printout.     Changes:  Medication Change: None at this point Rate Change: No change in rate  Reported side-effects or adverse reactions: None reported  Effectiveness: Described as relatively effective, allowing for increase in activities of daily living (ADL) Clinically meaningful improvement in function (CMIF): Sustained CMIF goals met  Plan: Pump refill today   Pharmacotherapy Assessment   Opioid Analgesic: Morphine (MS Contin) 30 mg PO BID (60 mg/day of morphine) (60 MME/day) +  PF-(intrathecal)-Fentanyl (13.4 mcg/hr)  changed from Oxycodone IR 10 mg 4x/day (40 mg/day of oxycodone) (60 MME/day), due to national shortage MME/day: 60 MME/day (oral) + 32.16 MME/day (intrathecal) =  93.16 MME/day (Aprox. 1.16  MME/day/kg).   Monitoring: [place] PMP: PDMP reviewed during this encounter.       Pharmacotherapy: No side-effects or adverse reactions reported. Compliance: No problems identified. Effectiveness: Clinically acceptable. Plan: Refer to "POC". UDS:  Summary  Date Value Ref Range Status  02/05/2022 Note  Final    Comment:    ==================================================================== [employer] Select 13 (MW) ==================================================================== Test                             Result       Flag       Units  Drug Present and Declared for Prescription Verification   Morphine                       >16129       EXPECTED   ng/mg creat   Normorphine                    671          EXPECTED   ng/mg creat    Potential sources of large amounts of morphine in the absence of    codeine include administration of morphine or use of heroin.     Normorphine is an expected metabolite of morphine.    Fentanyl                       11           EXPECTED   ng/mg creat   Norfentanyl                    90           EXPECTED   ng/mg creat    Source of fentanyl is a scheduled prescription medication, including    IV, patch, and transmucosal formulations. Norfentanyl is an expected    metabolite of fentanyl.  ==================================================================== Test                      Result    Flag   Units      Ref Range   Creatinine              62               mg/dL      >=60 ==================================================================== Declared Medications:  The flagging and interpretation on this report are based on the  following declared medications.  Unexpected results may arise from  inaccuracies in the declared medications.   **Note: The testing scope of this panel includes these medications:   Fentanyl  Morphine (MS Contin)   **Note: The testing scope of this panel does not include the  following reported medications:    Atorvastatin (Lipitor)  Biotin  Bupivacaine  Calcium  Carvedilol (Coreg)  Furosemide (Lasix)  Lisinopril (Zestril)  Metoprolol (Lopressor)  Naloxone (Narcan)  Vitamin C  Vitamin D  Vitamin E ==================================================================== For clinical consultation, please call [phone]. ====================================================================    No results found for: "CBDTHCR", "D8THCCBX", "D9THCCBX"   Pre-op H&P Assessment:  Ms. Counce is a 62 y.o. (year old), female patient, seen today for interventional treatment. 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 Ms. Cluster has a current medication list which includes the following prescription(s): AMBULATORY NON FORMULARY MEDICATION, atorvastatin, carvedilol, ergocalciferol, furosemide, lisinopril, morphine, morphine, and naloxone. Her primarily concern today is the No chief complaint on file.  Initial Vital Signs:  Pulse/HCG Rate:    Temp:   Resp:   BP:   SpO2:    BMI: Estimated body mass index is 29.23 kg/m as calculated from the following:   Height as of 11/24/22: 5\' 3"  (1.6 m).   Weight as of 11/24/22: 165 lb (74.8 kg).  Risk Assessment: Allergies: Reviewed. She is allergic to benadryl [diphenhydramine hcl], bee venom, and dexamethasone.  Allergy Precautions: None required Coagulopathies: Reviewed. None identified.  Blood-thinner therapy: None at this time Active Infection(s): Reviewed. None identified. Ms. Wipf is afebrile  Site Confirmation: Ms. Buendia was asked to confirm the procedure and  laterality before marking the site Procedure checklist: Completed Consent: Before the procedure and under the influence of no sedative(s), amnesic(s), or anxiolytics, the patient was informed of the treatment options, risks and possible complications. To fulfill our ethical and legal obligations, as recommended by the American Medical Association's Code of Ethics, I have informed the patient of my clinical impression; the nature and purpose of the treatment or procedure; the risks, benefits, and possible complications of the intervention; the alternatives, including doing nothing; the risk(s) and benefit(s) of the alternative treatment(s) or procedure(s); and the risk(s) and benefit(s) of doing nothing.  Ms. Lathen was provided with information about the general risks and possible complications associated with most interventional procedures. These include, but are not limited to: failure to achieve desired goals, infection, bleeding, organ or nerve damage, allergic reactions, paralysis, and/or death.  In addition, she was informed of those risks and possible complications associated to this particular procedure, which include, but are not limited to: damage to the implant; failure to decrease pain; local, systemic, or serious CNS infections, intraspinal abscess with possible cord compression and paralysis, or life-threatening such as meningitis; bleeding; organ damage; nerve injury or damage with subsequent sensory, motor, and/or autonomic system dysfunction, resulting in transient or permanent pain, numbness, and/or weakness of one or several areas of the body; allergic reactions, either minor or major life-threatening, such as anaphylactic or anaphylactoid reactions.  Furthermore, Ms. Weitkamp was informed of those risks and complications associated with the medications. These include, but are not limited to: allergic reactions (i.e.: anaphylactic or anaphylactoid reactions); endorphine suppression;  bradycardia and/or hypotension; water retention and/or peripheral vascular relaxation leading to lower extremity edema and possible stasis ulcers; respiratory depression and/or shortness of breath; decreased metabolic rate leading to weight gain; swelling or edema; medication-induced neural toxicity; particulate matter embolism and blood vessel occlusion with resultant organ, and/or nervous system infarction; and/or intrathecal granuloma formation with possible spinal cord compression and permanent paralysis.  Before refilling the pump Ms. Ishaq was informed that some of the medications used in the devise may not be FDA approved for such use and therefore it constitutes an off-label use of the medications.  Finally, she was informed that Medicine is not an exact science; therefore, there is also the possibility of unforeseen or unpredictable risks and/or possible complications that may result in a catastrophic outcome. The patient indicated having understood very clearly. We have given the patient no guarantees and we have made no promises. Enough time was given to the patient to ask questions, all of which were answered to the patient's satisfaction.  Ms. Peris has indicated that she wanted to continue with the procedure. Attestation: I, the ordering provider, attest that I have discussed with the patient the benefits, risks, side-effects, alternatives, likelihood of achieving goals, and potential problems during recovery for the procedure that I have provided informed consent. Date  Time: {CHL [facility]-PAIN TIME CHOICES:[id number]}  Pre-Procedure Preparation:  Monitoring: As per clinic protocol. Respiration, ETCO2, SpO2, BP, heart rate and rhythm monitor placed and checked for adequate function Safety Precautions: Patient was assessed for positional comfort and pressure points before starting the procedure. Time-out: I initiated and conducted the "Time-out" before starting the procedure, as per  protocol. The patient was asked to participate by confirming the accuracy of the "Time Out" information. Verification of the correct person, site, and procedure were performed and confirmed by me, the nursing staff, and the patient. "Time-out" conducted as per Joint Commission's Universal Protocol (UP.01.01.01). Time:   Start Time:   hrs.  Description of Procedure:          Position: Supine Target Area: Central-port of intrathecal pump. Approach: Anterior, 90 degree angle approach. Area Prepped: Entire Area around the pump implant. DuraPrep (Iodine Povacrylex [0.7% available iodine] and Isopropyl Alcohol, 74% w/w) Safety Precautions: Aspiration looking for blood return was conducted prior to all injections. At no point did we inject any substances, as a needle was being advanced. No attempts were made at seeking any paresthesias. Safe injection practices and needle disposal techniques used. Medications properly checked for expiration dates. SDV (single dose vial) medications used. Description of the Procedure: Protocol guidelines were followed. Two nurses trained to do implant refills were present during the entire procedure. The refill medication was checked by both healthcare providers as well as the patient. The patient was included in the "Time-out" to verify the medication. The patient was placed in position. The pump was identified. The area was prepped in the usual manner. The sterile template was positioned over the pump, making sure the side-port location matched that of the pump. Both, the pump and the template were held for stability. The needle provided in the Medtronic Kit was then introduced thru the center of the template and into the central port. The pump content was aspirated and discarded volume documented. The new medication was slowly infused into the pump, thru the filter, making sure to avoid overpressure of the device. The needle was then removed and the area cleansed, making sure  to leave some of the prepping solution back to take advantage of its long term bactericidal properties. The pump was interrogated and programmed to reflect the correct medication, volume, and dosage. The program was printed and taken to the physician for approval. Once checked and signed by the physician, a copy was provided to the patient and another scanned into the EMR.  There were no vitals filed for this visit.  Start Time:   hrs. End Time:   hrs. Materials & Medications: Medtronic Refill Kit Medication(s): Please see chart orders for details.  Imaging Guidance:          Type of Imaging Technique: None used Indication(s): N/A Exposure Time: No patient exposure Contrast: None used. Fluoroscopic Guidance: N/A Ultrasound Guidance: N/A Interpretation: N/A  Antibiotic Prophylaxis:   Anti-infectives (From admission, onward)    None      Indication(s): None identified  Post-operative Assessment:  Post-procedure Vital Signs:  Pulse/HCG Rate:    Temp:   Resp:   BP:   SpO2:    EBL: None  Complications: No immediate  post-treatment complications observed by team, or reported by patient.  Note: The patient tolerated the entire procedure well. A repeat set of vitals were taken after the procedure and the patient was kept under observation following institutional policy, for this type of procedure. Post-procedural neurological assessment was performed, showing return to baseline, prior to discharge. The patient was provided with post-procedure discharge instructions, including a section on how to identify potential problems. Should any problems arise concerning this procedure, the patient was given instructions to immediately contact us, at any time, without hesitation. In any case, we plan to contact the patient by telephone for a follow-up status report regarding this interventional procedure.  Comments:  No additional relevant information.  Plan of Care (POC)  Orders:  No orders  of the defined types were placed in this encounter.  Chronic Opioid Analgesic:  Morphine (MS Contin) 30 mg PO BID (60 mg/day of morphine) (60 MME/day) +  PF-(intrathecal)-Fentanyl (13.4 mcg/hr)  changed from Oxycodone IR 10 mg 4x/day (40 mg/day of oxycodone) (60 MME/day), due to national shortage MME/day: 60 MME/day (oral) + 32.16 MME/day (intrathecal) = 93.16 MME/day (Aprox. 1.16 MME/day/kg).   Medications ordered for procedure: No orders of the defined types were placed in this encounter.  Medications administered: Steward Drone A. Brannick had no medications administered during this visit.  See the medical record for exact dosing, route, and time of administration.  Follow-up plan:   No follow-ups on file.       Interventional Therapies  Risk Factors  Considerations:   ELIQUIS Anticoagulation (Stop: 3 days  Restart: 6 hours) Severe anxiety and agoraphobia  Poor candidate for RFA secondary to Lumbar hardware   Planned  Pending:   Intrathecal pump refill as per pump programming.   Under consideration:   Diagnostic left IA knee joint inj  Diagnostic bilateral L2 TFESI  Diagnostic left genicular NB  Possible left genicular nerve RFA  Diagnostic caudal ESI + epidurogram  Possible Racz procedure    Completed:   Palliative intrathecal pump refills and adjustments  Intrathecal pump insertion by Dr. Maeola Harman [facility]) (05/10/2012)  Intrathecal pump replacement by Dr. Odette Fraction [facility]) (05/09/2019)  Spinal cord stimulator replacement by Dr. Maeola Harman [facility]) (06/09/2016)    Therapeutic  Palliative (PRN) options:   Therapeutic/palliative intrathecal pump  management (analysis and refill)           Recent Visits Date Type Provider Dept  11/24/22 Office Visit Delano Metz, MD [facility]  11/05/22 Procedure visit Delano Metz, MD [facility]  Showing recent visits within past 90 days and  meeting all other requirements Future Appointments Date Type Provider Dept  02/02/23 Appointment Delano Metz, MD [facility]  Showing future appointments within next 90 days and meeting all other requirements  Disposition: Discharge home  Discharge (Date  Time): 02/02/2023;   hrs.   Primary Care Physician: Iona Hansen, NP Location: [facility] Note by: Oswaldo Done, MD (TTS technology used. I apologize for any typographical errors that were not detected and corrected.) Date: 02/02/2023; Time: 6:11 PM  Disclaimer:  Medicine is not an Visual merchandiser. The only guarantee in medicine is that nothing is guaranteed. It is important to note that the decision to proceed with this intervention was based on the information collected from the patient. The Data and conclusions were drawn from the patient's questionnaire, the interview, and the physical examination. Because the information was provided in large part by the patient,  it cannot be guaranteed that it has not been purposely or unconsciously manipulated. Every effort has been made to obtain as much relevant data as possible for this evaluation. It is important to note that the conclusions that lead to this procedure are derived in large part from the available data. Always take into account that the treatment will also be dependent on availability of resources and existing treatment guidelines, considered by other Pain Management Practitioners as being common knowledge and practice, at the time of the intervention. For Medico-Legal purposes, it is also important to point out that variation in procedural techniques and pharmacological choices are the acceptable norm. The indications, contraindications, technique, and results of the above procedure should only be interpreted and judged by a Board-Certified Interventional Pain Specialist with extensive familiarity and expertise in the same exact procedure  and technique.

## 2023-02-02 ENCOUNTER — Encounter: Payer: Self-pay | Admitting: Pain Medicine

## 2023-02-02 ENCOUNTER — Ambulatory Visit: Payer: Medicare HMO | Attending: Pain Medicine | Admitting: Pain Medicine

## 2023-02-02 VITALS — BP 150/59 | HR 66 | Temp 97.3°F | Resp 16 | Ht 63.0 in | Wt 170.0 lb

## 2023-02-02 DIAGNOSIS — M51369 Other intervertebral disc degeneration, lumbar region without mention of lumbar back pain or lower extremity pain: Secondary | ICD-10-CM

## 2023-02-02 DIAGNOSIS — Z451 Encounter for adjustment and management of infusion pump: Secondary | ICD-10-CM

## 2023-02-02 DIAGNOSIS — G894 Chronic pain syndrome: Secondary | ICD-10-CM

## 2023-02-02 DIAGNOSIS — M47816 Spondylosis without myelopathy or radiculopathy, lumbar region: Secondary | ICD-10-CM

## 2023-02-02 DIAGNOSIS — M961 Postlaminectomy syndrome, not elsewhere classified: Secondary | ICD-10-CM | POA: Diagnosis not present

## 2023-02-02 DIAGNOSIS — Z978 Presence of other specified devices: Secondary | ICD-10-CM

## 2023-02-02 DIAGNOSIS — Z79899 Other long term (current) drug therapy: Secondary | ICD-10-CM | POA: Diagnosis not present

## 2023-02-02 DIAGNOSIS — M5442 Lumbago with sciatica, left side: Secondary | ICD-10-CM | POA: Diagnosis not present

## 2023-02-02 DIAGNOSIS — M5136 Other intervertebral disc degeneration, lumbar region: Secondary | ICD-10-CM

## 2023-02-02 DIAGNOSIS — G8929 Other chronic pain: Secondary | ICD-10-CM

## 2023-02-02 DIAGNOSIS — M5417 Radiculopathy, lumbosacral region: Secondary | ICD-10-CM

## 2023-02-02 DIAGNOSIS — M79605 Pain in left leg: Secondary | ICD-10-CM | POA: Diagnosis not present

## 2023-02-02 DIAGNOSIS — Z79891 Long term (current) use of opiate analgesic: Secondary | ICD-10-CM

## 2023-02-02 DIAGNOSIS — M5441 Lumbago with sciatica, right side: Secondary | ICD-10-CM | POA: Diagnosis not present

## 2023-02-02 DIAGNOSIS — Z9682 Presence of neurostimulator: Secondary | ICD-10-CM | POA: Diagnosis not present

## 2023-02-02 DIAGNOSIS — M79604 Pain in right leg: Secondary | ICD-10-CM | POA: Insufficient documentation

## 2023-02-02 MED ORDER — MORPHINE SULFATE ER 30 MG PO TBCR
30.0000 mg | EXTENDED_RELEASE_TABLET | Freq: Two times a day (BID) | ORAL | 0 refills | Status: DC
Start: 2023-02-10 — End: 2023-05-11

## 2023-02-02 MED ORDER — MORPHINE SULFATE ER 30 MG PO TBCR
30.0000 mg | EXTENDED_RELEASE_TABLET | Freq: Two times a day (BID) | ORAL | 0 refills | Status: DC
Start: 2023-03-12 — End: 2023-05-11

## 2023-02-02 MED ORDER — MORPHINE SULFATE ER 30 MG PO TBCR
30.0000 mg | EXTENDED_RELEASE_TABLET | Freq: Two times a day (BID) | ORAL | 0 refills | Status: DC
Start: 2023-04-11 — End: 2023-05-11

## 2023-02-02 NOTE — Patient Instructions (Addendum)
Patient has Narcan at home and family knows how to use it.    Opioid Overdose Opioids are drugs that are often used to treat pain. Opioids include illegal drugs, such as heroin, as well as prescription pain medicines, such as codeine, morphine, hydrocodone, and fentanyl. An opioid overdose happens when you take too much of an opioid. An overdose may be intentional or accidental and can happen with any type of opioid. The effects of an overdose can be mild, dangerous, or even deadly. Opioid overdose is a medical emergency. What are the causes? This condition may be caused by: Taking too much of an opioid on purpose. Taking too much of an opioid by accident. Using two or more substances that contain opioids at the same time. Taking an opioid with a substance that affects your heart, breathing, or blood pressure. These include alcohol, tranquilizers, sleeping pills, illegal drugs, and some over-the-counter medicines. This condition may also happen due to an error made by: A health care provider who prescribes a medicine. The pharmacist who fills the prescription. What increases the risk? This condition is more likely in: Children. They may be attracted to colorful pills. Because of a child's small size, even a small amount of a medicine can be dangerous. Older people. They may be taking many different medicines. Older people may have difficulty reading labels or remembering when they last took their medicines. They may also be more sensitive to the effects of opioids. People with chronic medical conditions, especially heart, liver, kidney, or neurological diseases. People who take an opioid for a long period of time. People who take opioids and use illegal drugs, such as heroin, or other substances, such as alcohol. People who: Have a history of drug or alcohol abuse. Have certain mental health conditions. Have a history of previous drug overdoses. People who take opioids that are not  prescribed for them. What are the signs or symptoms? Symptoms of this condition depend on the type of opioid and the amount that was taken. Common symptoms include: Sleepiness or difficulty waking from sleep. Confusion. Slurred speech. Slowed breathing and a slow pulse (bradycardia). Nausea and vomiting. Abnormally small pupils. Signs and symptoms that require emergency treatment include: Cold, clammy, and pale skin. Blue lips and fingernails. Vomiting. Gurgling sounds in the throat. A pulse that is very slow or difficult to detect. Breathing that is very irregular, slow, noisy, or difficult to detect. Inability to respond to speech or be awakened from sleep (stupor). Seizures. How is this diagnosed? This condition is diagnosed based on your symptoms and medical history. It is important to tell your health care provider: About all of the opioids that you took. When you took the opioids. Whether you were drinking alcohol or using marijuana, cocaine, or other drugs. Your health care provider will do a physical exam. This exam may include: Checking and monitoring your heart rate and rhythm, breathing rate, temperature, and blood pressure. Measuring oxygen levels in your blood. Checking for abnormally small pupils. You may also have blood tests or urine tests. You may have X-rays if you are having severe breathing problems. How is this treated? This condition requires immediate medical treatment and hospitalization. Reversing the effects of the opioid is the first step in treatment. If you have a Narcan kit or naloxone, use it right away. Follow your health care provider's instructions. A friend or family member can also help you with this. The rest of your treatment will be given in the hospital intensive  care (ICU). Treatment in the hospital may include: Giving salts and minerals (electrolytes) along with fluids through an IV. Inserting a breathing tube (endotracheal tube) in your  airway to help you breathe if you cannot breathe on your own or you are in danger of not being able to breathe on your own. Giving oxygen through a small tube under your nose. Passing a tube through your nose and into your stomach (nasogastric tube, or NG tube) to empty your stomach. Giving medicines that: Increase your blood pressure. Relieve nausea and vomiting. Relieve abdominal pain and cramping. Reverse the effects of the opioid (naloxone). Monitoring your heart and oxygen levels. Ongoing counseling and mental health support if you intentionally overdosed or used an illegal drug. Follow these instructions at home:  Medicines Take over-the-counter and prescription medicines only as told by your health care provider. Always ask your health care provider about possible side effects and interactions of any new medicine that you start taking. Keep a list of all the medicines that you take, including over-the-counter medicines. Bring this list with you to all your medical visits. General instructions Drink enough fluid to keep your urine pale yellow. Keep all follow-up visits. This is important. How is this prevented? Read the drug inserts that come with your opioid pain medicines. Take medicines only as told by your health care provider. Do not take more medicine than you are told. Do not take medicines more frequently than you are told. Do not drink alcohol or take sedatives when taking opioids. Do not use illegal or recreational drugs, including cocaine, ecstasy, and marijuana. Do not take opioid medicines that are not prescribed for you. Store all medicines in safety containers that are out of the reach of children. Get help if you are struggling with: Alcohol or drug use. Depression or another mental health problem. Thoughts of hurting yourself or another person. Keep the phone number of your local poison control center near your phone or in your mobile phone. In the [place] the  hotline of the [facility] is 919-443-3271. If you were prescribed naloxone, make sure you understand how to take it. Contact a health care provider if: You need help understanding how to take your pain medicines. You feel your medicines are too strong. You are concerned that your pain medicines are not working well for your pain. You develop new symptoms or side effects when you are taking medicines. Get help right away if: You or someone else is having symptoms of an opioid overdose. Get help even if you are not sure. You have thoughts about hurting yourself or others. You have: Chest pain. Difficulty breathing. A loss of consciousness. These symptoms may represent a serious problem that is an emergency. Do not wait to see if the symptoms will go away. Get medical help right away. Call your local emergency services (911 in the [place] Do not drive yourself to the hospital. If you ever feel like you may hurt yourself or others, or have thoughts about taking your own life, get help right away. You can go to your nearest emergency department or: Call your local emergency services (911 in the [place] Call a suicide crisis helpline, such as the National Suicide Prevention Lifeline at 8165518228 or 988 in the [place] This is open 24 hours a day in the [place] Text the Crisis Text Line at 916-698-1493 (in the [place] Summary Opioids are drugs that are often used to treat pain. Opioids include illegal drugs, such as heroin,  as well as prescription pain medicines. An opioid overdose happens when you take too much of an opioid. Overdoses can be intentional or accidental. Opioid overdose is very dangerous. It is a life-threatening emergency. If you or someone you know is experiencing an opioid overdose, get help right away. This information is not intended to replace advice given to you by your health care provider. Make sure you discuss any questions you have with your health care  provider. Document Revised: 03/12/2021 Document Reviewed: 11/27/2020 Elsevier Patient Education  2024 [employer] Inc.  ____________________________________________________________________________________________  Opioid Pain Medication Update  To: All patients taking opioid pain medications. (I.e.: hydrocodone, hydromorphone, oxycodone, oxymorphone, morphine, codeine, methadone, tapentadol, tramadol, buprenorphine, fentanyl, etc.)  Re: Updated review of side effects and adverse reactions of opioid analgesics, as well as new information about long term effects of this class of medications.  Direct risks of long-term opioid therapy are not limited to opioid addiction and overdose. Potential medical risks include serious fractures, breathing problems during sleep, hyperalgesia, immunosuppression, chronic constipation, bowel obstruction, myocardial infarction, and tooth decay secondary to xerostomia.  Unpredictable adverse effects that can occur even if you take your medication correctly: Cognitive impairment, respiratory depression, and death. Most people think that if they take their medication "correctly", and "as instructed", that they will be safe. Nothing could be farther from the truth. In reality, a significant amount of recorded deaths associated with the use of opioids has occurred in individuals that had taken the medication for a long time, and were taking their medication correctly. The following are examples of how this can happen: Patient taking his/her medication for a long time, as instructed, without any side effects, is given a certain antibiotic or another unrelated medication, which in turn triggers a "Drug-to-drug interaction" leading to disorientation, cognitive impairment, impaired reflexes, respiratory depression or an untoward event leading to serious bodily harm or injury, including death.  Patient taking his/her medication for a long time, as instructed, without any side  effects, develops an acute impairment of liver and/or kidney function. This will lead to a rapid inability of the body to breakdown and eliminate their pain medication, which will result in effects similar to an "overdose", but with the same medicine and dose that they had always taken. This again may lead to disorientation, cognitive impairment, impaired reflexes, respiratory depression or an untoward event leading to serious bodily harm or injury, including death.  A similar problem will occur with patients as they grow older and their liver and kidney function begins to decrease as part of the aging process.  Background information: Historically, the original case for using long-term opioid therapy to treat chronic noncancer pain was based on safety assumptions that subsequent experience has called into question. In 1996, the American Pain Society and the American Academy of Pain Medicine issued a consensus statement supporting long-term opioid therapy. This statement acknowledged the dangers of opioid prescribing but concluded that the risk for addiction was low; respiratory depression induced by opioids was short-lived, occurred mainly in opioid-naive patients, and was antagonized by pain; tolerance was not a common problem; and efforts to control diversion should not constrain opioid prescribing. This has now proven to be wrong. Experience regarding the risks for opioid addiction, misuse, and overdose in community practice has failed to support these assumptions.  According to the Centers for Disease Control and Prevention, fatal overdoses involving opioid analgesics have increased sharply over the past decade. Currently, more than 96,700 people die from drug overdoses every year. Opioids  are a factor in 7 out of every 10 overdose deaths. Deaths from drug overdose have surpassed motor vehicle accidents as the leading cause of death for individuals between the ages of 73 and 65.  Clinical data suggest  that neuroendocrine dysfunction may be very common in both men and women, potentially causing hypogonadism, erectile dysfunction, infertility, decreased libido, osteoporosis, and depression. Recent studies linked higher opioid dose to increased opioid-related mortality. Controlled observational studies reported that long-term opioid therapy may be associated with increased risk for cardiovascular events. Subsequent meta-analysis concluded that the safety of long-term opioid therapy in elderly patients has not been proven.   Side Effects and adverse reactions: Common side effects: Drowsiness (sedation). Dizziness. Nausea and vomiting. Constipation. Physical dependence -- Dependence often manifests with withdrawal symptoms when opioids are discontinued or decreased. Tolerance -- As you take repeated doses of opioids, you require increased medication to experience the same effect of pain relief. Respiratory depression -- This can occur in healthy people, especially with higher doses. However, people with COPD, asthma or other lung conditions may be even more susceptible to fatal respiratory impairment.  Uncommon side effects: An increased sensitivity to feeling pain and extreme response to pain (hyperalgesia). Chronic use of opioids can lead to this. Delayed gastric emptying (the process by which the contents of your stomach are moved into your small intestine). Muscle rigidity. Immune system and hormonal dysfunction. Quick, involuntary muscle jerks (myoclonus). Arrhythmia. Itchy skin (pruritus). Dry mouth (xerostomia).  Long-term side effects: Chronic constipation. Sleep-disordered breathing (SDB). Increased risk of bone fractures. Hypothalamic-pituitary-adrenal dysregulation. Increased risk of overdose.  RISKS: Fractures and Falls:  Opioids increase the risk and incidence of falls. This is of particular importance in elderly patients.  Endocrine System:  Long-term administration is  associated with endocrine abnormalities (endocrinopathies). (Also known as Opioid-induced Endocrinopathy) Influences on both the hypothalamic-pituitary-adrenal axis?and the hypothalamic-pituitary-gonadal axis have been demonstrated with consequent hypogonadism and adrenal insufficiency in both sexes. Hypogonadism and decreased levels of dehydroepiandrosterone sulfate have been reported in men and women. Endocrine effects include: Amenorrhoea in women (abnormal absence of menstruation) Reduced libido in both sexes Decreased sexual function Erectile dysfunction in men Hypogonadisms (decreased testicular function with shrinkage of testicles) Infertility Depression and fatigue Loss of muscle mass Anxiety Depression Immune suppression Hyperalgesia Weight gain Anemia Osteoporosis Patients (particularly women of childbearing age) should avoid opioids. There is insufficient evidence to recommend routine monitoring of asymptomatic patients taking opioids in the long-term for hormonal deficiencies.  Immune System: Human studies have demonstrated that opioids have an immunomodulating effect. These effects are mediated via opioid receptors both on immune effector cells and in the central nervous system. Opioids have been demonstrated to have adverse effects on antimicrobial response and anti-tumour surveillance. Buprenorphine has been demonstrated to have no impact on immune function.  Opioid Induced Hyperalgesia: Human studies have demonstrated that prolonged use of opioids can lead to a state of abnormal pain sensitivity, sometimes called opioid induced hyperalgesia (OIH). Opioid induced hyperalgesia is not usually seen in the absence of tolerance to opioid analgesia. Clinically, hyperalgesia may be diagnosed if the patient on long-term opioid therapy presents with increased pain. This might be qualitatively and anatomically distinct from pain related to disease progression or to breakthrough  pain resulting from development of opioid tolerance. Pain associated with hyperalgesia tends to be more diffuse than the pre-existing pain and less defined in quality. Management of opioid induced hyperalgesia requires opioid dose reduction.  Cancer: Chronic opioid therapy has been associated with an increased risk  of cancer among noncancer patients with chronic pain. This association was more evident in chronic strong opioid users. Chronic opioid consumption causes significant pathological changes in the small intestine and colon. Epidemiological studies have found that there is a link between opium dependence and initiation of gastrointestinal cancers. Cancer is the second leading cause of death after cardiovascular disease. Chronic use of opioids can cause multiple conditions such as GERD, immunosuppression and renal damage as well as carcinogenic effects, which are associated with the incidence of cancers.   Mortality: Long-term opioid use has been associated with increased mortality among patients with chronic non-cancer pain (CNCP).  Prescription of long-acting opioids for chronic noncancer pain was associated with a significantly increased risk of all-cause mortality, including deaths from causes other than overdose.  Reference: Von Korff M, Kolodny A, Deyo RA, Chou R. Long-term opioid therapy reconsidered. Ann Intern Med. 2011 Sep 6;155(5):325-8. doi: 10.7326/[id number]. PMID: 16109604; PMCID: [id number]. Randon Goldsmith, Hayward RA, Dunn KM, [place] KP. Risk of adverse events in patients prescribed long-term opioids: A cohort study in the [place] [facility] [employer]. Eur J Pain. 2019 May;23(5):908-922. doi: 10.1002/ejp.1357. Epub 2019 Jan 31. PMID: 78295621. Colameco S, Coren JS, Ciervo CA. Continuous opioid treatment for chronic noncancer pain: a time for moderation in prescribing. Postgrad Med. 2009 Jul;121(4):61-6. doi: 10.3810/pgm.2009.07.2032.  PMID: 30865784. William Hamburger RN, [place], Blazina I, Cristopher [place], Bougatsos C, Deyo RA. The effectiveness and risks of long-term opioid therapy for chronic pain: a systematic review for a [employer]ways to [employer]. Ann Intern Med. 2015 Feb 17;162(4):276-86. doi: 10.7326/M14-2559. PMID: 69629528. Caryl Bis [facility], [place] DM. NCHS Data Brief No. 22. [place]: Centers for Disease Control and Prevention; 2009. Sep, Increase in Fatal Poisonings Involving Opioid Analgesics in the [place], 1999-2006. Song IA, Choi HR, Oh TK. Long-term opioid use and mortality in patients with chronic non-cancer pain: Ten-year follow-up study in [place] from 2010 through 2019. EClinicalMedicine. 2022 Jul 18;51:101558. doi: 10.1016/j.eclinm.2022.413244. PMID: 01027253; PMCID: [id number]. Huser, W., Schubert, T., Vogelmann, T. et al. All-cause mortality in patients with long-term opioid therapy compared with non-opioid analgesics for chronic non-cancer pain: a database study. BMC Med 18, 162 (2020). http://lester.info/ Rashidian H, Karie Kirks, Malekzadeh R, Haghdoost AA. An Ecological Study of the Association between Opiate Use and Incidence of Cancers. Addict Health. 2016 Fall;8(4):252-260. PMID: 59563875; PMCID: [id number].  Our Goal: Our goal is to control your pain with means other than the use of opioid pain medications.  Our Recommendation: Talk to your physician about coming off of these medications. We can assist you with the tapering down and stopping these medicines. Based on the new information, even if you cannot completely stop the medication, a decrease in the dose may be associated with a lesser risk. Ask for other means of controlling the pain. Decrease or eliminate those factors that significantly contribute to your pain such as smoking, obesity, and a diet heavily tilted towards "inflammatory"  nutrients.  Last Updated: 10/28/2022   ____________________________________________________________________________________________     ____________________________________________________________________________________________  National Pain Medication Shortage  The [place] experiencing worsening drug shortages. These have had a negative widespread effect on patient care and treatment. Not expected to improve any time soon. Predicted to last past 2029.   Drug shortage list (generic names) Oxycodone IR Oxycodone/APAP Oxymorphone IR Hydromorphone Hydrocodone/APAP Morphine  Where is the problem?  Manufacturing and supply level.  Will this shortage affect you?  Only  if you take any of the above pain medications.  How? You may be unable to fill your prescription.  Your pharmacist may offer a "partial fill" of your prescription. (Warning: Do not accept partial fills.) Prescriptions partially filled cannot be transferred to another pharmacy. Read our Medication Rules and Regulation. Depending on how much medicine you are dependent on, you may experience withdrawals when unable to get the medication.  Recommendations: Consider ending your dependence on opioid pain medications. Ask your pain specialist to assist you with the process. Consider switching to a medication currently not in shortage, such as Buprenorphine. Talk to your pain specialist about this option. Consider decreasing your pain medication requirements by managing tolerance thru "Drug Holidays". This may help minimize withdrawals, should you run out of medicine. Control your pain thru the use of non-pharmacological interventional therapies.   Your prescriber: Prescribers cannot be blamed for shortages. Medication manufacturing and supply issues cannot be fixed by the prescriber.   NOTE: The prescriber is not responsible for supplying the medication, or solving supply issues. Work with your pharmacist to solve it. The  patient is responsible for the decision to take or continue taking the medication and for identifying and securing a legal supply source. By law, supplying the medication is the job and responsibility of the pharmacy. The prescriber is responsible for the evaluation, monitoring, and prescribing of these medications.   Prescribers will NOT: Re-issue prescriptions that have been partially filled. Re-issue prescriptions already sent to a pharmacy.  Re-send prescriptions to a different pharmacy because yours did not have your medication. Ask pharmacist to order more medicine or transfer the prescription to another pharmacy. (Read below.)  New 2023 regulation: "May 01, 2022 Revised Regulation Allows DEA-Registered Pharmacies to Transfer Electronic Prescriptions at a Patient's Request DEA Headquarters Division - Public Information Office Patients now have the ability to request their electronic prescription be transferred to another pharmacy without having to go back to their practitioner to initiate the request. This revised regulation went into effect on Monday, April 27, 2022.     At a patient's request, a DEA-registered retail pharmacy can now transfer an electronic prescription for a controlled substance (schedules II-V) to another DEA-registered retail pharmacy. Prior to this change, patients would have to go through their practitioner to cancel their prescription and have it re-issued to a different pharmacy. The process was taxing and time consuming for both patients and practitioners.    The Drug Enforcement Administration [facility]) published its intent to revise the process for transferring electronic prescriptions on July 19, 2020.  The final rule was published in the federal register on March 26, 2022 and went into effect 30 days later.  Under the final rule, a prescription can only be transferred once between pharmacies, and only if allowed under existing state or other applicable law.  The prescription must remain in its electronic form; may not be altered in any way; and the transfer must be communicated directly between two licensed pharmacists. It's important to note, any authorized refills transfer with the original prescription, which means the entire prescription will be filled at the same pharmacy".  Reference: [email or web address].is [facility] website announcement)  [email or web address] [employer])   [employer] / Vol. 88, No. 143 / Thursday, March 26, 2022 / Rules and Regulations DEPARTMENT OF JUSTICE  Drug Enforcement Administration  21 CFR Part 1306  [Docket No. [id number]]  RIN [id number] Transfer of Electronic Prescriptions for Schedules II-V Controlled Substances Between Pharmacies for Initial Filling  ____________________________________________________________________________________________     ____________________________________________________________________________________________  Transfer of Pain Medication between Pharmacies  Re: 2023 DEA Clarification on existing regulation  Published on DEA Website: May 01, 2022  Title: Revised Regulation Allows DEA-Registered Pharmacies to Electrical engineer Prescriptions at a Patient's Request DEA Headquarters Division - [employer]  "Patients now have the ability to request their electronic prescription be transferred to another pharmacy without having to go back to their practitioner to initiate the request. This revised regulation went into effect on Monday, April 27, 2022.     At a patient's request, a DEA-registered retail pharmacy can now transfer an electronic prescription for a controlled substance (schedules II-V) to another DEA-registered retail pharmacy. Prior to this change, patients would have to go through  their practitioner to cancel their prescription and have it re-issued to a different pharmacy. The process was taxing and time consuming for both patients and practitioners.    The Drug Enforcement Administration [facility]) published its intent to revise the process for transferring electronic prescriptions on July 19, 2020.  The final rule was published in the federal register on March 26, 2022 and went into effect 30 days later.  Under the final rule, a prescription can only be transferred once between pharmacies, and only if allowed under existing state or other applicable law. The prescription must remain in its electronic form; may not be altered in any way; and the transfer must be communicated directly between two licensed pharmacists. It's important to note, any authorized refills transfer with the original prescription, which means the entire prescription will be filled at the same pharmacy."    REFERENCES: 1. DEA website announcement HugeHand.[email or web address]  2. Department of Justice website  [email or web address].pdf  3. DEPARTMENT OF JUSTICE Drug Enforcement Administration 21 CFR Part 1306 [Docket No. [id number]-637] RIN 1117-AB64 "Transfer of Electronic Prescriptions for Schedules II-V Controlled Substances Between Pharmacies for Initial Filling"  ____________________________________________________________________________________________     _______________________________________________________________________  Medication Rules  Purpose: To inform patients, and their family members, of our medication rules and regulations.  Applies to: All patients receiving prescriptions from our practice (written or electronic).  Pharmacy of record: This is the pharmacy where your electronic prescriptions will be sent. Make sure we have the correct  one.  Electronic prescriptions: In compliance with the [facility] Strengthen Opioid Misuse Prevention (STOP) Act of 2017 (Session Conni Elliot [phone]), effective August 31, 2018, all controlled substances must be electronically prescribed. Written prescriptions, faxing, or calling prescriptions to a pharmacy will no longer be done.  Prescription refills: These will be provided only during in-person appointments. No medications will be renewed without a "face-to-face" evaluation with your provider. Applies to all prescriptions.  NOTE: The following applies primarily to controlled substances (Opioid* Pain Medications).   Type of encounter (visit): For patients receiving controlled substances, face-to-face visits are required. (Not an option and not up to the patient.)  Patient's responsibilities: Pain Pills: Bring all pain pills to every appointment (except for procedure appointments). Pill Bottles: Bring pills in original pharmacy bottle. Bring bottle, even if empty. Always bring the bottle of the most recent fill.  Medication refills: You are responsible for knowing and keeping track of what medications you are taking and when is it that you will need a refill. The day before your appointment: write a list of all prescriptions that need to be refilled. The day of the appointment: give the list to the admitting nurse. Prescriptions will be written only during appointments. No prescriptions will be written on procedure days.  If you forget a medication: it will not be "Called in", "Faxed", or "electronically sent". You will need to get another appointment to get these prescribed. No early refills. Do not call asking to have your prescription filled early. Partial  or short prescriptions: Occasionally your pharmacy may not have enough pills to fill your prescription.  NEVER ACCEPT a partial fill or a prescription that is short of the total amount of pills that you were prescribed.  With controlled  substances the law allows 72 hours for the pharmacy to complete the prescription.  If the prescription is not completed within 72 hours, the pharmacist will require a new prescription to be written. This means that you will be short on your medicine and we WILL NOT send another prescription to complete your original prescription.  Instead, request the pharmacy to send a carrier to a nearby branch to get enough medication to provide you with your full prescription. Prescription Accuracy: You are responsible for carefully inspecting your prescriptions before leaving our office. Have the discharge nurse carefully go over each prescription with you, before taking them home. Make sure that your name is accurately spelled, that your address is correct. Check the name and dose of your medication to make sure it is accurate. Check the number of pills, and the written instructions to make sure they are clear and accurate. Make sure that you are given enough medication to last until your next medication refill appointment. Taking Medication: Take medication as prescribed. When it comes to controlled substances, taking less pills or less frequently than prescribed is permitted and encouraged. Never take more pills than instructed. Never take the medication more frequently than prescribed.  Inform other Doctors: Always inform, all of your healthcare providers, of all the medications you take. Pain Medication from other Providers: You are not allowed to accept any additional pain medication from any other Doctor or Healthcare provider. There are two exceptions to this rule. (see below) In the event that you require additional pain medication, you are responsible for notifying us, as stated below. Cough Medicine: Often these contain an opioid, such as codeine or hydrocodone. Never accept or take cough medicine containing these opioids if you are already taking an opioid* medication. The combination may cause respiratory  failure and death. Medication Agreement: You are responsible for carefully reading and following our Medication Agreement. This must be signed before receiving any prescriptions from our practice. Safely store a copy of your signed Agreement. Violations to the Agreement will result in no further prescriptions. (Additional copies of our Medication Agreement are available upon request.) Laws, Rules, & Regulations: All patients are expected to follow all [address] and [employer], Rules, [employer]. Ignorance of the Laws does not constitute a valid excuse.  Illegal drugs and Controlled Substances: The use of illegal substances (including, but not limited to marijuana and its derivatives) and/or the illegal use of any controlled substances is strictly prohibited. Violation of this rule may result in the immediate and permanent discontinuation of any and all prescriptions being written by our practice. The use of any illegal substances is prohibited. Adopted CDC guidelines & recommendations: Target dosing levels will be at or below 60 MME/day. Use of benzodiazepines** is not recommended.  Exceptions: There are only two exceptions to the rule of not receiving pain medications from other Healthcare Providers. Exception #1 (Emergencies): In the event of an emergency (i.e.: accident requiring emergency care), you are allowed to receive additional pain medication. However, you are responsible  for: As soon as you are able, call our office [phone], at any time of the day or night, and leave a message stating your name, the date and nature of the emergency, and the name and dose of the medication prescribed. In the event that your call is answered by a member of our staff, make sure to document and save the date, time, and the name of the person that took your information.  Exception #2 (Planned Surgery): In the event that you are scheduled by another doctor or dentist to have any type of surgery or  procedure, you are allowed (for a period no longer than 30 days), to receive additional pain medication, for the acute post-op pain. However, in this case, you are responsible for picking up a copy of our "Post-op Pain Management for Surgeons" handout, and giving it to your surgeon or dentist. This document is available at our office, and does not require an appointment to obtain it. Simply go to our office during business hours (Monday-Thursday from 8:00 AM to 4:00 PM) (Friday 8:00 AM to 12:00 Noon) or if you have a scheduled appointment with [place], prior to your surgery, and ask for it by name. In addition, you are responsible for: calling our office [phone], at any time of the day or night, and leaving a message stating your name, name of your surgeon, type of surgery, and date of procedure or surgery. Failure to comply with your responsibilities may result in termination of therapy involving the controlled substances. Medication Agreement Violation. Following the above rules, including your responsibilities will help you in avoiding a Medication Agreement Violation ("Breaking your Pain Medication Contract").  Consequences:  Not following the above rules may result in permanent discontinuation of medication prescription therapy.  *Opioid medications include: morphine, codeine, oxycodone, oxymorphone, hydrocodone, hydromorphone, meperidine, tramadol, tapentadol, buprenorphine, fentanyl, methadone. **Benzodiazepine medications include: diazepam (Valium), alprazolam (Xanax), clonazepam (Klonopine), lorazepam (Ativan), clorazepate (Tranxene), chlordiazepoxide (Librium), estazolam (Prosom), oxazepam (Serax), temazepam (Restoril), triazolam (Halcion) (Last updated: 06/23/2022) ______________________________________________________________________    ______________________________________________________________________  Medication Recommendations and Reminders  Applies to: All patients receiving  prescriptions (written and/or electronic).  Medication Rules & Regulations: You are responsible for reading, knowing, and following our "Medication Rules" document. These exist for your safety and that of others. They are not flexible and neither are we. Dismissing or ignoring them is an act of "non-compliance" that may result in complete and irreversible termination of such medication therapy. For safety reasons, "non-compliance" will not be tolerated. As with the [place] fundamental legal principle of "ignorance of the law is no defense", we will accept no excuses for not having read and knowing the content of documents provided to you by our practice.  Pharmacy of record:  Definition: This is the pharmacy where your electronic prescriptions will be sent.  We do not endorse any particular pharmacy. It is up to you and your insurance to decide what pharmacy to use.  We do not restrict you in your choice of pharmacy. However, once we write for your prescriptions, we will NOT be re-sending more prescriptions to fix restricted supply problems created by your pharmacy, or your insurance.  The pharmacy listed in the electronic medical record should be the one where you want electronic prescriptions to be sent. If you choose to change pharmacy, simply notify our nursing staff. Changes will be made only during your regular appointments and not over the phone.  Recommendations: Keep all of your pain medications in a safe place, under  lock and key, even if you live alone. We will NOT replace lost, stolen, or damaged medication. We do not accept "Police Reports" as proof of medications having been stolen. After you fill your prescription, take 1 week's worth of pills and put them away in a safe place. You should keep a separate, properly labeled bottle for this purpose. The remainder should be kept in the original bottle. Use this as your primary supply, until it runs out. Once it's gone, then you know that you  have 1 week's worth of medicine, and it is time to come in for a prescription refill. If you do this correctly, it is unlikely that you will ever run out of medicine. To make sure that the above recommendation works, it is very important that you make sure your medication refill appointments are scheduled at least 1 week before you run out of medicine. To do this in an effective manner, make sure that you do not leave the office without scheduling your next medication management appointment. Always ask the nursing staff to show you in your prescription , when your medication will be running out. Then arrange for the receptionist to get you a return appointment, at least 7 days before you run out of medicine. Do not wait until you have 1 or 2 pills left, to come in. This is very poor planning and does not take into consideration that we may need to cancel appointments due to bad weather, sickness, or emergencies affecting our staff. DO NOT ACCEPT A "Partial Fill": If for any reason your pharmacy does not have enough pills/tablets to completely fill or refill your prescription, do not allow for a "partial fill". The law allows the pharmacy to complete that prescription within 72 hours, without requiring a new prescription. If they do not fill the rest of your prescription within those 72 hours, you will need a separate prescription to fill the remaining amount, which we will NOT provide. If the reason for the partial fill is your insurance, you will need to talk to the pharmacist about payment alternatives for the remaining tablets, but again, DO NOT ACCEPT A PARTIAL FILL, unless you can trust your pharmacist to obtain the remainder of the pills within 72 hours.  Prescription refills and/or changes in medication(s):  Prescription refills, and/or changes in dose or medication, will be conducted only during scheduled medication management appointments. (Applies to both, written and electronic prescriptions.) No  refills on procedure days. No medication will be changed or started on procedure days. No changes, adjustments, and/or refills will be conducted on a procedure day. Doing so will interfere with the diagnostic portion of the procedure. No phone refills. No medications will be "called into the pharmacy". No Fax refills. No weekend refills. No Holliday refills. No after hours refills.  Remember:  Business hours are:  Monday to Thursday 8:00 AM to 4:00 PM Provider's Schedule: Delano Metz, MD - Appointments are:  Medication management: Monday and Wednesday 8:00 AM to 4:00 PM Procedure day: Tuesday and Thursday 7:30 AM to 4:00 PM Edward Jolly, MD - Appointments are:  Medication management: Tuesday and Thursday 8:00 AM to 4:00 PM Procedure day: Monday and Wednesday 7:30 AM to 4:00 PM (Last update: 06/23/2022) ______________________________________________________________________    ____________________________________________________________________________________________  Drug Holidays  What is a "Drug Holiday"? Drug Holiday: is the name given to the process of slowly tapering down and temporarily stopping the pain medication for the purpose of decreasing or eliminating tolerance to the drug.  Benefits  Improved effectiveness Decreased required effective dose Improved pain control End dependence on high dose therapy Decrease cost of therapy Uncovering "opioid-induced hyperalgesia". (OIH)  What is "opioid hyperalgesia"? It is a paradoxical increase in pain caused by exposure to opioids. Stopping the opioid pain medication, contrary to the expected, it actually decreases or completely eliminates the pain. Ref.: "A comprehensive review of opioid-induced hyperalgesia". Donney Rankins, et.al. Pain Physician. 2011 Mar-Apr;14(2):145-61.  What is tolerance? Tolerance: the progressive loss of effectiveness of a pain medicine due to repetitive use. A common problem of opioid pain  medications.  How long should a "Drug Holiday" last? Effectiveness depends on the patient staying off all opioid pain medicines for a minimum of 14 consecutive days. (2 weeks)  How about just taking less of the medicine? Does not work. Will not accomplish goal of eliminating the excess receptors.  How about switching to a different pain medicine? (AKA. "Opioid rotation") Does not work. Creates the illusion of effectiveness by taking advantage of inaccurate equivalent dose calculations between different opioids. -This "technique" was promoted by studies funded by [employer], such as [employer], creators of "OxyContin".  Can I stop the medicine "cold [place]"? We do not recommend it. You should always coordinate with your prescribing physician to make the transition as smoothly as possible. Avoid stopping the medicine abruptly without consulting. We recommend a "slow taper".  What is a slow taper? Taper: refers to the gradual decrease in dose.   How do I stop/taper the dose? Slowly. Decrease the daily amount of pills that you take by one (1) pill every seven (7) days. This is called a "slow downward taper". Example: if you normally take four (4) pills per day, drop it to three (3) pills per day for seven (7) days, then to two (2) pills per day for seven (7) days, then to one (1) per day for seven (7) days, and then stop the medicine. The 14 day "Drug Holiday" starts on the first day without medicine.   Will I experience withdrawals? Unlikely with a slow taper.  What triggers withdrawals? Withdrawals are triggered by the sudden/abrupt stop of high dose opioids. Withdrawals can be avoided by slowly decreasing the dose over a prolonged period of time.  What are withdrawals? Symptoms associated with sudden/abrupt reduction/stopping of high-dose, long-term use of pain medication. Withdrawal are seldom seen on low dose therapy, or patients rarely taking opioid medication.  Early  Withdrawal Symptoms may include: Agitation Anxiety Muscle aches Increased tearing Insomnia Runny nose Sweating Yawning  Late symptoms may include: Abdominal cramping Diarrhea Dilated pupils Goose bumps Nausea Vomiting  When could I see withdrawals? Onset: 8-24 hours after last use for most opioids. 12-48 hours for long-acting opioids (i.e.: methadone)  How long could they last? Duration: 4-10 days for most opioids. 14-21 days for long-acting opioids (i.e.: methadone)  What will happen after I complete my "Drug Holiday"? The need and indications for the opioid analgesic will be reviewed before restarting the medication. Dose requirements will likely decrease and the dose will need to be adjusted accordingly.   (Last update: 11/18/2022) ____________________________________________________________________________________________

## 2023-02-02 NOTE — Progress Notes (Signed)
Nursing Pain Medication Assessment:  Safety precautions to be maintained throughout the outpatient stay will include: orient to surroundings, keep bed in low position, maintain call bell within reach at all times, provide assistance with transfer out of bed and ambulation.  Medication Inspection Compliance: Pill count conducted under aseptic conditions, in front of the patient. Neither the pills nor the bottle was removed from the patient's sight at any time. Once count was completed pills were immediately returned to the patient in their original bottle.  Medication: Morphine ER (MSContin) Pill/Patch Count:  15 of 60 pills remain Pill/Patch Appearance: Markings consistent with prescribed medication Bottle Appearance: Standard pharmacy container. Clearly labeled. Filled Date: 05 / 13 / 2024 Last Medication intake:  Today

## 2023-02-03 ENCOUNTER — Telehealth: Payer: Self-pay

## 2023-02-03 NOTE — Telephone Encounter (Signed)
Post pump refill follow up.  Patient states she is doing good.  

## 2023-02-15 ENCOUNTER — Telehealth: Payer: Self-pay

## 2023-02-15 NOTE — Telephone Encounter (Signed)
Patient needs to ome in for a UDS.  It was not done at her last visit.  I attempted to call her but there was no answer and no answering machine.

## 2023-02-16 ENCOUNTER — Telehealth: Payer: Self-pay | Admitting: *Deleted

## 2023-02-16 NOTE — Telephone Encounter (Signed)
LM for patient to call office

## 2023-02-16 NOTE — Telephone Encounter (Signed)
Called patient to let her know that she needs to come in and give a UDS.  It was overlooked at her last  visit.  States she will come in tomorrow which will be 02/17/23.

## 2023-02-17 DIAGNOSIS — G894 Chronic pain syndrome: Secondary | ICD-10-CM | POA: Diagnosis not present

## 2023-02-17 DIAGNOSIS — Z978 Presence of other specified devices: Secondary | ICD-10-CM | POA: Diagnosis not present

## 2023-02-17 DIAGNOSIS — Z79891 Long term (current) use of opiate analgesic: Secondary | ICD-10-CM | POA: Diagnosis not present

## 2023-02-17 DIAGNOSIS — Z79899 Other long term (current) drug therapy: Secondary | ICD-10-CM | POA: Diagnosis not present

## 2023-02-22 DIAGNOSIS — Z1231 Encounter for screening mammogram for malignant neoplasm of breast: Secondary | ICD-10-CM | POA: Diagnosis not present

## 2023-02-22 DIAGNOSIS — J0110 Acute frontal sinusitis, unspecified: Secondary | ICD-10-CM | POA: Diagnosis not present

## 2023-02-23 LAB — TOXASSURE SELECT 13 (MW), URINE

## 2023-03-15 MED FILL — Medication: INTRATHECAL | Qty: 1 | Status: AC

## 2023-03-17 DIAGNOSIS — Z1231 Encounter for screening mammogram for malignant neoplasm of breast: Secondary | ICD-10-CM | POA: Diagnosis not present

## 2023-04-21 ENCOUNTER — Other Ambulatory Visit: Payer: Self-pay

## 2023-04-21 MED ORDER — PAIN MANAGEMENT IT PUMP REFILL: 1.0000 | Freq: Once | INTRATHECAL | 0 refills | Status: AC

## 2023-04-26 ENCOUNTER — Other Ambulatory Visit: Payer: Self-pay | Admitting: Cardiology

## 2023-04-28 ENCOUNTER — Other Ambulatory Visit: Payer: Self-pay | Admitting: Physician Assistant

## 2023-04-28 NOTE — Telephone Encounter (Signed)
Patient of Dr. Acharya. Please review for refill. Thank you!  

## 2023-05-03 DIAGNOSIS — I951 Orthostatic hypotension: Secondary | ICD-10-CM | POA: Diagnosis not present

## 2023-05-03 DIAGNOSIS — I10 Essential (primary) hypertension: Secondary | ICD-10-CM | POA: Diagnosis not present

## 2023-05-03 DIAGNOSIS — H9202 Otalgia, left ear: Secondary | ICD-10-CM | POA: Diagnosis not present

## 2023-05-03 DIAGNOSIS — H6503 Acute serous otitis media, bilateral: Secondary | ICD-10-CM | POA: Diagnosis not present

## 2023-05-03 DIAGNOSIS — R42 Dizziness and giddiness: Secondary | ICD-10-CM | POA: Diagnosis not present

## 2023-05-03 DIAGNOSIS — J019 Acute sinusitis, unspecified: Secondary | ICD-10-CM | POA: Diagnosis not present

## 2023-05-06 NOTE — Progress Notes (Signed)
PROVIDER NOTE: Interpretation of information contained herein should be left to medically-trained personnel. Specific patient instructions are provided elsewhere under "Patient Instructions" section of medical record. This document was created in part using STT-dictation technology, any transcriptional errors that may result from this process are unintentional.  Patient: Kendra Nelson Type: Established DOB: Feb 08, 1959 MRN: [id number] PCP: Iona Hansen, NP  Service: Procedure DOS: 05/11/2023 Setting: Ambulatory Location: Ambulatory outpatient facility Delivery: Face-to-face Provider: Oswaldo Done, MD Specialty: Interventional Pain Management Specialty designation: 09 Location: Outpatient facility Ref. Prov.: Iona Hansen, NP       Interventional Therapy   Primary Reason for Visit: Interventional Pain Management Treatment. CC: No chief complaint on file.  Procedure:          Type: Management of Intrathecal Drug Delivery System (IDDS) - Reservoir Refill (40981). No rate change.  Indications: 1. Chronic pain syndrome   2. Chronic low back pain (1ry area of Pain) (Bilateral) (R>L) w/ sciatica (Bilateral)   3. Chronic lower extremity pain (2ry area of Pain) (Bilateral) (R>L)   4. 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 DDD (degenerative disc disease), lumbar   6. Lumbar facet syndrome (Bilateral) (R>L)   7. Chronic lumbosacral radiculopathy (L5) (Left)   8. Presence of neurostimulator   9. Presence of intrathecal pump   10. Pharmacologic therapy   11. Long term prescription opiate use   12. Chronic use of opiate for therapeutic purpose   13. Encounter for medication management   14. Encounter for chronic pain management    Pain Assessment: Self-Reported Pain Score: 3 /10             Reported level is compatible with observation.        RTCB: 08/09/2023    Intrathecal Drug Delivery System (IDDS)  Pump Device:  Manufacturer:  Medtronic Model: Synchromed II Model No.: [id number] Serial No.: [id number] Delivery Route: Intrathecal Type: Programmable  Volume (mL): 40 mL reservoir Priming Volume: n/a  Calibration Constant: 120.0  MRI compatibility: Conditional   Implant Details:  Date: 05/09/2019 Implanter: Odette Fraction, MD  Contact Information: [facility] Last Revision/Replacement: 05/09/2019 Estimated Replacement Date: May/2027  Implant Site: Abdominal Laterality: Left  Catheter: Manufacturer: Medtronic Model:  (two piece) Model No.: [id number]  Serial No.: n/a  Implanted Length (cm): 38.1  Catheter Volume (mL): 0.214  Tip Location (Level): T6-7 Canal Access Site: n/a  Drug content:  Primary Medication Class: Opioid  Medication: PF-Fentanyl  Concentration: 1,000 mcg/mL   Secondary Medication Class: Local Anesthetic  Medication: PF-Bupivacaine  Concentration: 20.0 mg/mL   Tertiary Medication Class: none   PA parameters (PCA-mode):  Mode: Off (Inactive)  Programming:  Type: Simple continuous.  Medication, Concentration, Infusion Program, & Delivery Rate: For up-to-date details please see most recent scanned programming printout.      Changes:  Medication Change: None at this point Rate Change: No change in rate  Reported side-effects or adverse reactions: None reported  Effectiveness: Described as relatively effective, allowing for increase in activities of daily living (ADL) Clinically meaningful improvement in function (CMIF): Sustained CMIF goals met  Plan: Pump refill today   Pharmacotherapy Assessment   Opioid Analgesic: Morphine (MS Contin) 30 mg PO BID (60 mg/day of morphine) (60 MME/day) +  PF-(intrathecal)-Fentanyl (13.4 mcg/hr)  changed from Oxycodone IR 10 mg 4x/day (40 mg/day of oxycodone) (60 MME/day), due to national shortage MME/day: 60 MME/day (oral) + 32.16 MME/day (intrathecal) = 93.16 MME/day (Aprox. 1.16 MME/day/kg).  Monitoring: [place] PMP:  PDMP reviewed during this encounter.       Pharmacotherapy: No side-effects or adverse reactions reported. Compliance: No problems identified. Effectiveness: Clinically acceptable. Plan: Refer to "POC". UDS:  Summary  Date Value Ref Range Status  02/17/2023 Note  Final    Comment:    ==================================================================== [employer]ox[employer] Select 13 (MW) ==================================================================== Test                             Result       Flag       Units  Drug Present and Declared for Prescription Verification   Morphine                       >6849        EXPECTED   ng/mg creat   Normorphine                    440          EXPECTED   ng/mg creat    Potential sources of large amounts of morphine in the absence of    codeine include administration of morphine or use of heroin.     Normorphine is an expected metabolite of morphine.    Hydromorphone                  71           EXPECTED   ng/mg creat    Hydromorphone may be present as a metabolite of morphine;    concentrations of hydromorphone rarely exceed 5% of the morphine    concentration when this is the source of hydromorphone.    Fentanyl                       27           EXPECTED   ng/mg creat   Norfentanyl                    95           EXPECTED   ng/mg creat    Source of fentanyl is a scheduled prescription medication, including    IV, patch, and transmucosal formulations. Norfentanyl is an expected    metabolite of fentanyl.  ==================================================================== Test                      Result    Flag   Units      Ref Range   Creatinine              146              mg/dL      >=16 ==================================================================== Declared Medications:  The flagging and interpretation on this report are based on the  following declared medications.  Unexpected results may arise from  inaccuracies in the declared  medications.   **Note: The testing scope of this panel includes these medications:   Fentanyl  Morphine (MS Contin)   **Note: The testing scope of this panel does not include the  following reported medications:   Atorvastatin (Lipitor)  Bupivacaine  Carvedilol (Coreg)  Furosemide (Lasix)  Lisinopril (Zestril)  Naloxone (Narcan)  Vitamin D2 ==================================================================== For clinical consultation, please call [phone]. ====================================================================    No results found for: "CBDTHCR", "D8THCCBX", "D9THCCBX"   H&P (Pre-op Assessment):  Ms. Strom is a  62 y.o. (year old), female patient, seen today for interventional treatment. 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 Ms. Peppers has a current medication list which includes the following prescription(s): AMBULATORY NON FORMULARY MEDICATION, amlodipine, atorvastatin, carvedilol, furosemide, lisinopril, morphine, [START ON 06/10/2023] morphine, [START ON 07/10/2023] morphine, and naloxone. Her primarily concern today is the No chief complaint on file.  Initial Vital Signs:  Pulse/HCG Rate: 60  Temp: (!) 96.8 F (36 C) Resp: 18 BP: (!) 161/76 SpO2: 98 %  BMI: Estimated body mass index is 29.58 kg/m as calculated from the following:   Height as of this encounter: 5\' 3"  (1.6 m).   Weight as of this encounter: 167 lb (75.8 kg).  Risk Assessment: Allergies: Reviewed. She is allergic to benadryl [diphenhydramine hcl], bee venom, and dexamethasone.  Allergy  Precautions: None required Coagulopathies: Reviewed. None identified.  Blood-thinner therapy: None at this time Active Infection(s): Reviewed. None identified. Ms. Johanson is afebrile  Site Confirmation: Ms. Dusseau was asked to confirm the procedure and laterality before marking the site Procedure checklist: Completed Consent: Before the procedure and under the influence of no sedative(s), amnesic(s), or anxiolytics, the patient was informed of the treatment options, risks and possible complications. To fulfill our ethical and legal obligations, as recommended by the American Medical Association's Code of Ethics, I have informed the patient of my clinical impression; the nature and purpose of the treatment or procedure; the risks, benefits, and possible complications of the intervention; the alternatives, including doing nothing; the risk(s) and benefit(s) of the alternative treatment(s) or procedure(s); and the risk(s) and benefit(s) of doing nothing.  Ms. Wollman was provided with information about the general risks and possible complications associated with most interventional procedures. These include, but are not limited to: failure to achieve desired goals, infection, bleeding, organ or nerve damage, allergic reactions, paralysis, and/or death.  In addition, she was informed of those risks and possible complications associated to this particular procedure, which include, but are not limited to: damage to the implant; failure to decrease pain; local, systemic, or serious CNS infections, intraspinal abscess with possible cord compression and paralysis, or life-threatening such as meningitis; bleeding; organ damage; nerve injury or damage with subsequent sensory, motor, and/or autonomic system dysfunction, resulting in transient or permanent pain, numbness, and/or weakness of one or several areas of the body; allergic reactions, either minor or major life-threatening, such as anaphylactic or  anaphylactoid reactions.  Furthermore, Ms. Trachtman was informed of those risks and complications associated with the medications. These include, but are not limited to: allergic reactions (i.e.: anaphylactic or anaphylactoid reactions); endorphine suppression; bradycardia and/or hypotension; water retention and/or peripheral vascular relaxation leading to lower extremity edema and possible stasis ulcers; respiratory depression and/or shortness of breath; decreased metabolic rate leading to weight gain; swelling or edema; medication-induced neural toxicity; particulate matter embolism and blood vessel occlusion with resultant organ, and/or nervous system infarction; and/or intrathecal granuloma formation with possible spinal cord compression and permanent paralysis.  Before refilling the pump Ms. Wintrode was informed that some of the medications used in the devise may not be FDA approved for such use and therefore it constitutes an off-label use of the medications.  Finally, she was informed that Medicine is not an  exact science; therefore, there is also the possibility of unforeseen or unpredictable risks and/or possible complications that may result in a catastrophic outcome. The patient indicated having understood very clearly. We have given the patient no guarantees and we have made no promises. Enough time was given to the patient to ask questions, all of which were answered to the patient's satisfaction. Ms. Celentano has indicated that she wanted to continue with the procedure. Attestation: I, the ordering provider, attest that I have discussed with the patient the benefits, risks, side-effects, alternatives, likelihood of achieving goals, and potential problems during recovery for the procedure that I have provided informed consent. Date  Time: 05/11/2023  1:01 PM  Pre-Procedure Preparation:  Monitoring: As per clinic protocol. Respiration, ETCO2, SpO2, BP, heart rate and rhythm monitor placed  and checked for adequate function Safety Precautions: Patient was assessed for positional comfort and pressure points before starting the procedure. Time-out: I initiated and conducted the "Time-out" before starting the procedure, as per protocol. The patient was asked to participate by confirming the accuracy of the "Time Out" information. Verification of the correct person, site, and procedure were performed and confirmed by me, the nursing staff, and the patient. "Time-out" conducted as per Joint Commission's Universal Protocol (UP.01.01.01). Time: 1305 Start Time: 1306 hrs.  Description of Procedure:          Position: Supine Target Area: Central-port of intrathecal pump. Approach: Anterior, 90 degree angle approach. Area Prepped: Entire Area around the pump implant. DuraPrep (Iodine Povacrylex [0.7% available iodine] and Isopropyl Alcohol, 74% w/w) Safety Precautions: Aspiration looking for blood return was conducted prior to all injections. At no point did we inject any substances, as a needle was being advanced. No attempts were made at seeking any paresthesias. Safe injection practices and needle disposal techniques used. Medications properly checked for expiration dates. SDV (single dose vial) medications used. Description of the Procedure: Protocol guidelines were followed. Two nurses trained to do implant refills were present during the entire procedure. The refill medication was checked by both healthcare providers as well as the patient. The patient was included in the "Time-out" to verify the medication. The patient was placed in position. The pump was identified. The area was prepped in the usual manner. The sterile template was positioned over the pump, making sure the side-port location matched that of the pump. Both, the pump and the template were held for stability. The needle provided in the Medtronic Kit was then introduced thru the center of the template and into the central port.  The pump content was aspirated and discarded volume documented. The new medication was slowly infused into the pump, thru the filter, making sure to avoid overpressure of the device. The needle was then removed and the area cleansed, making sure to leave some of the prepping solution back to take advantage of its long term bactericidal properties. The pump was interrogated and programmed to reflect the correct medication, volume, and dosage. The program was printed and taken to the physician for approval. Once checked and signed by the physician, a copy was provided to the patient and another scanned into the EMR.  Vitals:   05/11/23 1259  BP: (!) 161/76  Pulse: 60  Resp: 18  Temp: (!) 96.8 F (36 C)  TempSrc: Temporal  SpO2: 98%  Weight: 167 lb (75.8 kg)  Height: 5\' 3"  (1.6 m)    Start Time: 1306 hrs. End Time: 1313 hrs. Materials & Medications: Medtronic Refill Kit Medication(s): Please see chart  orders for details.  Type of Imaging Technique: None used Indication(s): N/A Exposure Time: No patient exposure Contrast: None used. Fluoroscopic Guidance: N/A Ultrasound Guidance: N/A Interpretation: N/A  Antibiotic Prophylaxis:   Anti-infectives (From admission, onward)    None      Indication(s): None identified  Post-operative Assessment:  Post-procedure Vital Signs:  Pulse/HCG Rate: 60  Temp: (!) 96.8 F (36 C) Resp: 18 BP: (!) 161/76 SpO2: 98 %  EBL: None  Complications: No immediate post-treatment complications observed by team, or reported by patient.  Note: The patient tolerated the entire procedure well. A repeat set of vitals were taken after the procedure and the patient was kept under observation following institutional policy, for this type of procedure. Post-procedural neurological assessment was performed, showing return to baseline, prior to discharge. The patient was provided with post-procedure discharge instructions, including a section on how to  identify potential problems. Should any problems arise concerning this procedure, the patient was given instructions to immediately contact us, at any time, without hesitation. In any case, we plan to contact the patient by telephone for a follow-up status report regarding this interventional procedure.  Comments:  No additional relevant information.  Plan of Care (POC)  Orders:  Orders Placed This Encounter  Procedures   PUMP REFILL    Maintain Protocol by having two(2) healthcare providers during procedure and programming.    Scheduling Instructions:     Please refill intrathecal pump today.    Order Specific Question:   Where will this procedure be performed?    Answer:   [facility]   PUMP REFILL    Whenever possible schedule on a procedure today.    Standing Status:   Future    Standing Expiration Date:   09/10/2023    Scheduling Instructions:     Please schedule intrathecal pump refill based on pump programming. Avoid schedule intervals of more than 120 days (4 months).    Order Specific Question:   Where will this procedure be performed?    Answer:   [facility]   Informed Consent Details: Physician/Practitioner Attestation; Transcribe to consent form and obtain patient signature    Transcribe to consent form and obtain patient signature.    Order Specific Question:   Physician/Practitioner attestation of informed consent for procedure/surgical case    Answer:   I, the physician/practitioner, attest that I have discussed with the patient the benefits, risks, side effects, alternatives, likelihood of achieving goals and potential problems during recovery for the procedure that I have provided informed consent.    Order Specific Question:   Procedure    Answer:   Intrathecal pump refill    Order Specific Question:   Physician/Practitioner performing the procedure    Answer:   Attending Physician: Sydnee Levans. Laban Emperor, MD & designated trained staff    Order Specific  Question:   Indication/Reason    Answer:   Chronic Pain Syndrome (G89.4), presence of an intrathecal pump (Z97.8)   Chronic Opioid Analgesic:  Morphine (MS Contin) 30 mg PO BID (60 mg/day of morphine) (60 MME/day) +  PF-(intrathecal)-Fentanyl (13.4 mcg/hr)  changed from Oxycodone IR 10 mg 4x/day (40 mg/day of oxycodone) (60 MME/day), due to national shortage MME/day: 60 MME/day (oral) + 32.16 MME/day (intrathecal) = 93.16 MME/day (Aprox. 1.16 MME/day/kg).   Medications ordered for procedure: Meds ordered this encounter  Medications   morphine (MS CONTIN) 30 MG 12 hr tablet    Sig: Take 1 tablet (30 mg total) by mouth every  12 (twelve) hours. Must last 30 days. Do not break tablet    Dispense:  60 tablet    Refill:  0    DO NOT: delete (not duplicate); no partial-fill (will deny script to complete), no refill request (F/U required). DISPENSE: 1 day early if closed on fill date. WARN: No CNS-depressants within 8 hrs of med.   morphine (MS CONTIN) 30 MG 12 hr tablet    Sig: Take 1 tablet (30 mg total) by mouth every 12 (twelve) hours. Must last 30 days. Do not break tablet    Dispense:  60 tablet    Refill:  0    DO NOT: delete (not duplicate); no partial-fill (will deny script to complete), no refill request (F/U required). DISPENSE: 1 day early if closed on fill date. WARN: No CNS-depressants within 8 hrs of med.   morphine (MS CONTIN) 30 MG 12 hr tablet    Sig: Take 1 tablet (30 mg total) by mouth every 12 (twelve) hours. Must last 30 days. Do not break tablet    Dispense:  60 tablet    Refill:  0    DO NOT: delete (not duplicate); no partial-fill (will deny script to complete), no refill request (F/U required). DISPENSE: 1 day early if closed on fill date. WARN: No CNS-depressants within 8 hrs of med.   Medications administered: Steward Drone A. Blackham had no medications administered during this visit.  See the medical record for exact dosing, route, and time of administration.  Follow-up  plan:   Return for Pump Refill (Max:66mo).       Interventional Therapies  Risk Factors  Considerations:   ELIQUIS Anticoagulation (Stop: 3 days  Restart: 6 hours) Severe anxiety and agoraphobia  Poor candidate for RFA secondary to Lumbar hardware   Planned  Pending:   Intrathecal pump refill as per pump programming.   Under consideration:   Diagnostic left IA knee joint inj  Diagnostic bilateral L2 TFESI  Diagnostic left genicular NB  Possible left genicular nerve RFA  Diagnostic caudal ESI + epidurogram  Possible Racz procedure    Completed:   Palliative intrathecal pump refills and adjustments  Intrathecal pump insertion by Dr. Maeola Harman [facility]) (05/10/2012)  Intrathecal pump replacement by Dr. Odette Fraction [facility]) (05/09/2019)  Spinal cord stimulator replacement by Dr. Maeola Harman [facility]) (06/09/2016)    Therapeutic  Palliative (PRN) options:   Therapeutic/palliative intrathecal pump  management (analysis and refill)            Recent Visits No visits were found meeting these conditions. Showing recent visits within past 90 days and meeting all other requirements Today's Visits Date Type Provider Dept  05/11/23 Procedure visit Delano Metz, MD [facility]  Showing today's visits and meeting all other requirements Future Appointments No visits were found meeting these conditions. Showing future appointments within next 90 days and meeting all other requirements  Disposition: Discharge home  Discharge (Date  Time): 05/11/2023; 1327 hrs.   Primary Care Physician: Iona Hansen, NP Location: [facility] Note by: Oswaldo Done, MD (TTS technology used. I apologize for any typographical errors that were not detected and corrected.) Date: 05/11/2023; Time: 1:28 PM  Disclaimer:  Medicine is not an Visual merchandiser. The only guarantee in medicine is that nothing is  guaranteed. It is important to note that the decision to proceed with this intervention was based on the information collected from the patient. The Data and conclusions were drawn from  the patient's questionnaire, the interview, and the physical examination. Because the information was provided in large part by the patient, it cannot be guaranteed that it has not been purposely or unconsciously manipulated. Every effort has been made to obtain as much relevant data as possible for this evaluation. It is important to note that the conclusions that lead to this procedure are derived in large part from the available data. Always take into account that the treatment will also be dependent on availability of resources and existing treatment guidelines, considered by other Pain Management Practitioners as being common knowledge and practice, at the time of the intervention. For Medico-Legal purposes, it is also important to point out that variation in procedural techniques and pharmacological choices are the acceptable norm. The indications, contraindications, technique, and results of the above procedure should only be interpreted and judged by a Board-Certified Interventional Pain Specialist with extensive familiarity and expertise in the same exact procedure and technique.

## 2023-05-11 ENCOUNTER — Encounter: Payer: Self-pay | Admitting: Pain Medicine

## 2023-05-11 ENCOUNTER — Ambulatory Visit: Payer: Medicare HMO | Attending: Pain Medicine | Admitting: Pain Medicine

## 2023-05-11 VITALS — BP 161/76 | HR 60 | Temp 96.8°F | Resp 18 | Ht 63.0 in | Wt 167.0 lb

## 2023-05-11 DIAGNOSIS — M5417 Radiculopathy, lumbosacral region: Secondary | ICD-10-CM | POA: Diagnosis not present

## 2023-05-11 DIAGNOSIS — Z978 Presence of other specified devices: Secondary | ICD-10-CM | POA: Insufficient documentation

## 2023-05-11 DIAGNOSIS — Z451 Encounter for adjustment and management of infusion pump: Secondary | ICD-10-CM | POA: Insufficient documentation

## 2023-05-11 DIAGNOSIS — M79604 Pain in right leg: Secondary | ICD-10-CM | POA: Insufficient documentation

## 2023-05-11 DIAGNOSIS — G894 Chronic pain syndrome: Secondary | ICD-10-CM | POA: Insufficient documentation

## 2023-05-11 DIAGNOSIS — M5441 Lumbago with sciatica, right side: Secondary | ICD-10-CM | POA: Insufficient documentation

## 2023-05-11 DIAGNOSIS — G8929 Other chronic pain: Secondary | ICD-10-CM | POA: Insufficient documentation

## 2023-05-11 DIAGNOSIS — Z9682 Presence of neurostimulator: Secondary | ICD-10-CM | POA: Insufficient documentation

## 2023-05-11 DIAGNOSIS — M79605 Pain in left leg: Secondary | ICD-10-CM | POA: Insufficient documentation

## 2023-05-11 DIAGNOSIS — M5442 Lumbago with sciatica, left side: Secondary | ICD-10-CM | POA: Diagnosis not present

## 2023-05-11 DIAGNOSIS — Z79899 Other long term (current) drug therapy: Secondary | ICD-10-CM | POA: Insufficient documentation

## 2023-05-11 DIAGNOSIS — Z79891 Long term (current) use of opiate analgesic: Secondary | ICD-10-CM | POA: Diagnosis not present

## 2023-05-11 DIAGNOSIS — M961 Postlaminectomy syndrome, not elsewhere classified: Secondary | ICD-10-CM | POA: Insufficient documentation

## 2023-05-11 DIAGNOSIS — M5136 Other intervertebral disc degeneration, lumbar region: Secondary | ICD-10-CM | POA: Insufficient documentation

## 2023-05-11 DIAGNOSIS — M47816 Spondylosis without myelopathy or radiculopathy, lumbar region: Secondary | ICD-10-CM | POA: Diagnosis not present

## 2023-05-11 MED ORDER — MORPHINE SULFATE ER 30 MG PO TBCR: 30.0000 mg | EXTENDED_RELEASE_TABLET | Freq: Two times a day (BID) | ORAL | 0 refills | Status: DC

## 2023-05-11 NOTE — Progress Notes (Signed)
Nursing Pain Medication Assessment:  Safety precautions to be maintained throughout the outpatient stay will include: orient to surroundings, keep bed in low position, maintain call bell within reach at all times, provide assistance with transfer out of bed and ambulation.  Medication Inspection Compliance: Pill count conducted under aseptic conditions, in front of the patient. Neither the pills nor the bottle was removed from the patient's sight at any time. Once count was completed pills were immediately returned to the patient in their original bottle.  Medication: Oxycodone IR Pill/Patch Count:  1 of 60 pills remain Pill/Patch Appearance: Markings consistent with prescribed medication Bottle Appearance: Standard pharmacy container. Clearly labeled. Filled Date: 08 / 12 / 2024 Last Medication intake:  Today

## 2023-05-11 NOTE — Patient Instructions (Addendum)
Patient has Narcan at home and patient states he knows how to use it.   Opioid Overdose Opioids are drugs that are often used to treat pain. Opioids include illegal drugs, such as heroin, as well as prescription pain medicines, such as codeine, morphine, hydrocodone, and fentanyl. An opioid overdose happens when you take too much of an opioid. An overdose may be intentional or accidental and can happen with any type of opioid. The effects of an overdose can be mild, dangerous, or even deadly. Opioid overdose is a medical emergency. What are the causes? This condition may be caused by: Taking too much of an opioid on purpose. Taking too much of an opioid by accident. Using two or more substances that contain opioids at the same time. Taking an opioid with a substance that affects your heart, breathing, or blood pressure. These include alcohol, tranquilizers, sleeping pills, illegal drugs, and some over-the-counter medicines. This condition may also happen due to an error made by: A health care provider who prescribes a medicine. The pharmacist who fills the prescription. What increases the risk? This condition is more likely in: Children. They may be attracted to colorful pills. Because of a child's small size, even a small amount of a medicine can be dangerous. Older people. They may be taking many different medicines. Older people may have difficulty reading labels or remembering when they last took their medicines. They may also be more sensitive to the effects of opioids. People with chronic medical conditions, especially heart, liver, kidney, or neurological diseases. People who take an opioid for a long period of time. People who take opioids and use illegal drugs, such as heroin, or other substances, such as alcohol. People who: Have a history of drug or alcohol abuse. Have certain mental health conditions. Have a history of previous drug overdoses. People who take opioids that are  not prescribed for them. What are the signs or symptoms? Symptoms of this condition depend on the type of opioid and the amount that was taken. Common symptoms include: Sleepiness or difficulty waking from sleep. Confusion. Slurred speech. Slowed breathing and a slow pulse (bradycardia). Nausea and vomiting. Abnormally small pupils. Signs and symptoms that require emergency treatment include: Cold, clammy, and pale skin. Blue lips and fingernails. Vomiting. Gurgling sounds in the throat. A pulse that is very slow or difficult to detect. Breathing that is very irregular, slow, noisy, or difficult to detect. Inability to respond to speech or be awakened from sleep (stupor). Seizures. How is this diagnosed? This condition is diagnosed based on your symptoms and medical history. It is important to tell your health care provider: About all of the opioids that you took. When you took the opioids. Whether you were drinking alcohol or using marijuana, cocaine, or other drugs. Your health care provider will do a physical exam. This exam may include: Checking and monitoring your heart rate and rhythm, breathing rate, temperature, and blood pressure. Measuring oxygen levels in your blood. Checking for abnormally small pupils. You may also have blood tests or urine tests. You may have X-rays if you are having severe breathing problems. How is this treated? This condition requires immediate medical treatment and hospitalization. Reversing the effects of the opioid is the first step in treatment. If you have a Narcan kit or naloxone, use it right away. Follow your health care provider's instructions. A friend or family member can also help you with this. The rest of your treatment will be given in the hospital intensive care (  ICU). Treatment in the hospital may include: Giving salts and minerals (electrolytes) along with fluids through an IV. Inserting a breathing tube (endotracheal tube) in your  airway to help you breathe if you cannot breathe on your own or you are in danger of not being able to breathe on your own. Giving oxygen through a small tube under your nose. Passing a tube through your nose and into your stomach (nasogastric tube, or NG tube) to empty your stomach. Giving medicines that: Increase your blood pressure. Relieve nausea and vomiting. Relieve abdominal pain and cramping. Reverse the effects of the opioid (naloxone). Monitoring your heart and oxygen levels. Ongoing counseling and mental health support if you intentionally overdosed or used an illegal drug. Follow these instructions at home:  Medicines Take over-the-counter and prescription medicines only as told by your health care provider. Always ask your health care provider about possible side effects and interactions of any new medicine that you start taking. Keep a list of all the medicines that you take, including over-the-counter medicines. Bring this list with you to all your medical visits. General instructions Drink enough fluid to keep your urine pale yellow. Keep all follow-up visits. This is important. How is this prevented? Read the drug inserts that come with your opioid pain medicines. Take medicines only as told by your health care provider. Do not take more medicine than you are told. Do not take medicines more frequently than you are told. Do not drink alcohol or take sedatives when taking opioids. Do not use illegal or recreational drugs, including cocaine, ecstasy, and marijuana. Do not take opioid medicines that are not prescribed for you. Store all medicines in safety containers that are out of the reach of children. Get help if you are struggling with: Alcohol or drug use. Depression or another mental health problem. Thoughts of hurting yourself or another person. Keep the phone number of your local poison control center near your phone or in your mobile phone. In the [place] the  hotline of the [facility] is (650)344-4772. If you were prescribed naloxone, make sure you understand how to take it. Contact a health care provider if: You need help understanding how to take your pain medicines. You feel your medicines are too strong. You are concerned that your pain medicines are not working well for your pain. You develop new symptoms or side effects when you are taking medicines. Get help right away if: You or someone else is having symptoms of an opioid overdose. Get help even if you are not sure. You have thoughts about hurting yourself or others. You have: Chest pain. Difficulty breathing. A loss of consciousness. These symptoms may represent a serious problem that is an emergency. Do not wait to see if the symptoms will go away. Get medical help right away. Call your local emergency services (911 in the [place] Do not drive yourself to the hospital. If you ever feel like you may hurt yourself or others, or have thoughts about taking your own life, get help right away. You can go to your nearest emergency department or: Call your local emergency services (911 in the [place] Call a suicide crisis helpline, such as the National Suicide Prevention Lifeline at 603-273-4082 or 988 in the [place] This is open 24 hours a day in the [place] Text the Crisis Text Line at 413-339-3182 (in the [place] Summary Opioids are drugs that are often used to treat pain. Opioids include illegal drugs, such as heroin, as  well as prescription pain medicines. An opioid overdose happens when you take too much of an opioid. Overdoses can be intentional or accidental. Opioid overdose is very dangerous. It is a life-threatening emergency. If you or someone you know is experiencing an opioid overdose, get help right away. This information is not intended to replace advice given to you by your health care provider. Make sure you discuss any questions you have with your health care  provider. Document Revised: 03/12/2021 Document Reviewed: 11/27/2020 Elsevier Patient Education  2024 [employer]  ______________________________________________________________________    Opioid Pain Medication Update  To: All patients taking opioid pain medications. (I.e.: hydrocodone, hydromorphone, oxycodone, oxymorphone, morphine, codeine, methadone, tapentadol, tramadol, buprenorphine, fentanyl, etc.)  Re: Updated review of side effects and adverse reactions of opioid analgesics, as well as new information about long term effects of this class of medications.  Direct risks of long-term opioid therapy are not limited to opioid addiction and overdose. Potential medical risks include serious fractures, breathing problems during sleep, hyperalgesia, immunosuppression, chronic constipation, bowel obstruction, myocardial infarction, and tooth decay secondary to xerostomia.  Unpredictable adverse effects that can occur even if you take your medication correctly: Cognitive impairment, respiratory depression, and death. Most people think that if they take their medication "correctly", and "as instructed", that they will be safe. Nothing could be farther from the truth. In reality, a significant amount of recorded deaths associated with the use of opioids has occurred in individuals that had taken the medication for a long time, and were taking their medication correctly. The following are examples of how this can happen: Patient taking his/her medication for a long time, as instructed, without any side effects, is given a certain antibiotic or another unrelated medication, which in turn triggers a "Drug-to-drug interaction" leading to disorientation, cognitive impairment, impaired reflexes, respiratory depression or an untoward event leading to serious bodily harm or injury, including death.  Patient taking his/her medication for a long time, as instructed, without any side effects, develops an  acute impairment of liver and/or kidney function. This will lead to a rapid inability of the body to breakdown and eliminate their pain medication, which will result in effects similar to an "overdose", but with the same medicine and dose that they had always taken. This again may lead to disorientation, cognitive impairment, impaired reflexes, respiratory depression or an untoward event leading to serious bodily harm or injury, including death.  A similar problem will occur with patients as they grow older and their liver and kidney function begins to decrease as part of the aging process.  Background information: Historically, the original case for using long-term opioid therapy to treat chronic noncancer pain was based on safety assumptions that subsequent experience has called into question. In 1996, the American Pain Society and the American Academy of Pain Medicine issued a consensus statement supporting long-term opioid therapy. This statement acknowledged the dangers of opioid prescribing but concluded that the risk for addiction was low; respiratory depression induced by opioids was short-lived, occurred mainly in opioid-naive patients, and was antagonized by pain; tolerance was not a common problem; and efforts to control diversion should not constrain opioid prescribing. This has now proven to be wrong. Experience regarding the risks for opioid addiction, misuse, and overdose in community practice has failed to support these assumptions.  According to the Centers for Disease Control and Prevention, fatal overdoses involving opioid analgesics have increased sharply over the past decade. Currently, more than 96,700 people die from drug overdoses every year.  Opioids are a factor in 7 out of every 10 overdose deaths. Deaths from drug overdose have surpassed motor vehicle accidents as the leading cause of death for individuals between the ages of 72 and 109.  Clinical data suggest that neuroendocrine  dysfunction may be very common in both men and women, potentially causing hypogonadism, erectile dysfunction, infertility, decreased libido, osteoporosis, and depression. Recent studies linked higher opioid dose to increased opioid-related mortality. Controlled observational studies reported that long-term opioid therapy may be associated with increased risk for cardiovascular events. Subsequent meta-analysis concluded that the safety of long-term opioid therapy in elderly patients has not been proven.   Side Effects and adverse reactions: Common side effects: Drowsiness (sedation). Dizziness. Nausea and vomiting. Constipation. Physical dependence -- Dependence often manifests with withdrawal symptoms when opioids are discontinued or decreased. Tolerance -- As you take repeated doses of opioids, you require increased medication to experience the same effect of pain relief. Respiratory depression -- This can occur in healthy people, especially with higher doses. However, people with COPD, asthma or other lung conditions may be even more susceptible to fatal respiratory impairment.  Uncommon side effects: An increased sensitivity to feeling pain and extreme response to pain (hyperalgesia). Chronic use of opioids can lead to this. Delayed gastric emptying (the process by which the contents of your stomach are moved into your small intestine). Muscle rigidity. Immune system and hormonal dysfunction. Quick, involuntary muscle jerks (myoclonus). Arrhythmia. Itchy skin (pruritus). Dry mouth (xerostomia).  Long-term side effects: Chronic constipation. Sleep-disordered breathing (SDB). Increased risk of bone fractures. Hypothalamic-pituitary-adrenal dysregulation. Increased risk of overdose.  RISKS: Respiratory depression and death: Opioids increase the risk of respiratory depression and death.  Drug-to-drug interactions: Opioids are relatively contraindicated in combination with  benzodiazepines, sleep inducers, and other central nervous system depressants. Other classes of medications (i.e.: certain antibiotics and even over-the-counter medications) may also trigger or induce respiratory depression in some patients.  Medical conditions: Patients with pre-existing respiratory problems are at higher risk of respiratory failure and/or depression when in combination with opioid analgesics. Opioids are relatively contraindicated in some medical conditions such as central sleep apnea.   Fractures and Falls:  Opioids increase the risk and incidence of falls. This is of particular importance in elderly patients.  Endocrine System:  Long-term administration is associated with endocrine abnormalities (endocrinopathies). (Also known as Opioid-induced Endocrinopathy) Influences on both the hypothalamic-pituitary-adrenal axis?and the hypothalamic-pituitary-gonadal axis have been demonstrated with consequent hypogonadism and adrenal insufficiency in both sexes. Hypogonadism and decreased levels of dehydroepiandrosterone sulfate have been reported in men and women. Endocrine effects include: Amenorrhoea in women (abnormal absence of menstruation) Reduced libido in both sexes Decreased sexual function Erectile dysfunction in men Hypogonadisms (decreased testicular function with shrinkage of testicles) Infertility Depression and fatigue Loss of muscle mass Anxiety Depression Immune suppression Hyperalgesia Weight gain Anemia Osteoporosis Patients (particularly women of childbearing age) should avoid opioids. There is insufficient evidence to recommend routine monitoring of asymptomatic patients taking opioids in the long-term for hormonal deficiencies.  Immune System: Human studies have demonstrated that opioids have an immunomodulating effect. These effects are mediated via opioid receptors both on immune effector cells and in the central nervous system. Opioids have been  demonstrated to have adverse effects on antimicrobial response and anti-tumour surveillance. Buprenorphine has been demonstrated to have no impact on immune function.  Opioid Induced Hyperalgesia: Human studies have demonstrated that prolonged use of opioids can lead to a state of abnormal pain sensitivity, sometimes called opioid induced hyperalgesia (OIH). Opioid  induced hyperalgesia is not usually seen in the absence of tolerance to opioid analgesia. Clinically, hyperalgesia may be diagnosed if the patient on long-term opioid therapy presents with increased pain. This might be qualitatively and anatomically distinct from pain related to disease progression or to breakthrough pain resulting from development of opioid tolerance. Pain associated with hyperalgesia tends to be more diffuse than the pre-existing pain and less defined in quality. Management of opioid induced hyperalgesia requires opioid dose reduction.  Cancer: Chronic opioid therapy has been associated with an increased risk of cancer among noncancer patients with chronic pain. This association was more evident in chronic strong opioid users. Chronic opioid consumption causes significant pathological changes in the small intestine and colon. Epidemiological studies have found that there is a link between opium dependence and initiation of gastrointestinal cancers. Cancer is the second leading cause of death after cardiovascular disease. Chronic use of opioids can cause multiple conditions such as GERD, immunosuppression and renal damage as well as carcinogenic effects, which are associated with the incidence of cancers.   Mortality: Long-term opioid use has been associated with increased mortality among patients with chronic non-cancer pain (CNCP).  Prescription of long-acting opioids for chronic noncancer pain was associated with a significantly increased risk of all-cause mortality, including deaths from causes other than  overdose.  Reference: Von Korff M, Kolodny A, Deyo RA, Chou R. Long-term opioid therapy reconsidered. Ann Intern Med. 2011 Sep 6;155(5):325-8. doi: 10.7326/[id number]. PMID: 16109604; PMCID: [id number]. Randon Goldsmith, Hayward RA, Dunn KM, [place] KP. Risk of adverse events in patients prescribed long-term opioids: A cohort study in the [place] [facility] [employer]. Eur J Pain. 2019 May;23(5):908-922. doi: 10.1002/ejp.1357. Epub 2019 Jan 31. PMID: 78295621. Colameco S, Coren JS, Ciervo CA. Continuous opioid treatment for chronic noncancer pain: a time for moderation in prescribing. Postgrad Med. 2009 Jul;121(4):61-6. doi: 10.3810/pgm.2009.07.2032. PMID: 30865784. William Hamburger RN, [place], Blazina I, Cristopher [place], Bougatsos C, Deyo RA. The effectiveness and risks of long-term opioid therapy for chronic pain: a systematic review for a [employer] to [employer]. Ann Intern Med. 2015 Feb 17;162(4):276-86. doi: 10.7326/M14-2559. PMID: 69629528. Caryl Bis [facility], [place] DM. NCHS Data Brief No. 22. [place]: Centers for Disease Control and Prevention; 2009. Sep, Increase in Fatal Poisonings Involving Opioid Analgesics in the [place], 1999-2006. Song IA, Choi HR, Oh TK. Long-term opioid use and mortality in patients with chronic non-cancer pain: Ten-year follow-up study in [place] from 2010 through 2019. EClinicalMedicine. 2022 Jul 18;51:101558. doi: 10.1016/j.eclinm.2022.413244. PMID: 01027253; PMCID: [id number]. Huser, W., Schubert, T., Vogelmann, T. et al. All-cause mortality in patients with long-term opioid therapy compared with non-opioid analgesics for chronic non-cancer pain: a database study. BMC Med 18, 162 (2020). http://lester.info/ Rashidian H, Karie Kirks, Malekzadeh R, Haghdoost AA. An Ecological Study of the Association between Opiate Use and Incidence  of Cancers. Addict Health. 2016 Fall;8(4):252-260. PMID: 59563875; PMCID: [id number].  Our Goal: Our goal is to control your pain with means other than the use of opioid pain medications.  Our Recommendation: Talk to your physician about coming off of these medications. We can assist you with the tapering down and stopping these medicines. Based on the new information, even if you cannot completely stop the medication, a decrease in the dose may be associated with a lesser risk. Ask for other means of controlling the pain. Decrease or eliminate those factors that significantly contribute to your pain such as  smoking, obesity, and a diet heavily tilted towards "inflammatory" nutrients.  Last Updated: 03/08/2023   ______________________________________________________________________       ______________________________________________________________________    National Pain Medication Shortage  The [place] experiencing worsening drug shortages. These have had a negative widespread effect on patient care and treatment. Not expected to improve any time soon. Predicted to last past 2029.   Drug shortage list (generic names) Oxycodone IR Oxycodone/APAP Oxymorphone IR Hydromorphone Hydrocodone/APAP Morphine  Where is the problem?  Manufacturing and supply level.  Will this shortage affect you?  Only if you take any of the above pain medications.  How? You may be unable to fill your prescription.  Your pharmacist may offer a "partial fill" of your prescription. (Warning: Do not accept partial fills.) Prescriptions partially filled cannot be transferred to another pharmacy. Read our Medication Rules and Regulation. Depending on how much medicine you are dependent on, you may experience withdrawals when unable to get the medication.  Recommendations: Consider ending your dependence on opioid pain medications. Ask your pain specialist to assist you with the process. Consider  switching to a medication currently not in shortage, such as Buprenorphine. Talk to your pain specialist about this option. Consider decreasing your pain medication requirements by managing tolerance thru "Drug Holidays". This may help minimize withdrawals, should you run out of medicine. Control your pain thru the use of non-pharmacological interventional therapies.   Your prescriber: Prescribers cannot be blamed for shortages. Medication manufacturing and supply issues cannot be fixed by the prescriber.   NOTE: The prescriber is not responsible for supplying the medication, or solving supply issues. Work with your pharmacist to solve it. The patient is responsible for the decision to take or continue taking the medication and for identifying and securing a legal supply source. By law, supplying the medication is the job and responsibility of the pharmacy. The prescriber is responsible for the evaluation, monitoring, and prescribing of these medications.   Prescribers will NOT: Re-issue prescriptions that have been partially filled. Re-issue prescriptions already sent to a pharmacy.  Re-send prescriptions to a different pharmacy because yours did not have your medication. Ask pharmacist to order more medicine or transfer the prescription to another pharmacy. (Read below.)  New 2023 regulation: "May 01, 2022 Revised Regulation Allows DEA-Registered Pharmacies to Transfer Electronic Prescriptions at a Patient's Request DEA Headquarters Division - Public Information Office Patients now have the ability to request their electronic prescription be transferred to another pharmacy without having to go back to their practitioner to initiate the request. This revised regulation went into effect on Monday, April 27, 2022.     At a patient's request, a DEA-registered retail pharmacy can now transfer an electronic prescription for a controlled substance (schedules II-V) to another DEA-registered  retail pharmacy. Prior to this change, patients would have to go through their practitioner to cancel their prescription and have it re-issued to a different pharmacy. The process was taxing and time consuming for both patients and practitioners.    The Drug Enforcement Administration [facility]) published its intent to revise the process for transferring electronic prescriptions on July 19, 2020.  The final rule was published in the federal register on March 26, 2022 and went into effect 30 days later.  Under the final rule, a prescription can only be transferred once between pharmacies, and only if allowed under existing state or other applicable law. The prescription must remain in its electronic form; may not be altered in any way; and the transfer must  be communicated directly between two licensed pharmacists. It's important to note, any authorized refills transfer with the original prescription, which means the entire prescription will be filled at the same pharmacy".  Reference: [email or web address] [facility] website announcement)  [email or web address] Financial planner of [employer])   [employer] / Vol. 88, No. 143 / Thursday, March 26, 2022 / Rules and Regulations DEPARTMENT OF JUSTICE  Drug Enforcement Administration  21 CFR Part 1306  [Docket No. [id number]]  RIN [id number] Transfer of Electronic Prescriptions for Schedules II-V Controlled Substances Between Pharmacies for Initial Filling  ______________________________________________________________________       ______________________________________________________________________    Transfer of Pain Medication between Pharmacies  Re: 2023 DEA Clarification on existing regulation  Published on DEA Website: May 01, 2022  Title: Revised Regulation Allows DEA-Registered  Pharmacies to Electrical engineer Prescriptions at a Patient's Request DEA Headquarters Division - [employer]  "Patients now have the ability to request their electronic prescription be transferred to another pharmacy without having to go back to their practitioner to initiate the request. This revised regulation went into effect on Monday, April 27, 2022.     At a patient's request, a DEA-registered retail pharmacy can now transfer an electronic prescription for a controlled substance (schedules II-V) to another DEA-registered retail pharmacy. Prior to this change, patients would have to go through their practitioner to cancel their prescription and have it re-issued to a different pharmacy. The process was taxing and time consuming for both patients and practitioners.    The Drug Enforcement Administration [facility]) published its intent to revise the process for transferring electronic prescriptions on July 19, 2020.  The final rule was published in the federal register on March 26, 2022 and went into effect 30 days later.  Under the final rule, a prescription can only be transferred once between pharmacies, and only if allowed under existing state or other applicable law. The prescription must remain in its electronic form; may not be altered in any way; and the transfer must be communicated directly between two licensed pharmacists. It's important to note, any authorized refills transfer with the original prescription, which means the entire prescription will be filled at the same pharmacy."    REFERENCES: 1. DEA website announcement [email or web address]  2. Department of Justice website  [email or web address]  3. DEPARTMENT OF JUSTICE Drug Enforcement Administration 21 CFR Part 1306 [Docket No. [id number]] RIN [id number] "Transfer of  Electronic Prescriptions for Schedules II-V Controlled Substances Between Pharmacies for Initial Filling"  ______________________________________________________________________       ______________________________________________________________________    Medication Rules  Purpose: To inform patients, and their family members, of our medication rules and regulations.  Applies to: All patients receiving prescriptions from our practice (written or electronic).  Pharmacy of record: This is the pharmacy where your electronic prescriptions will be sent. Make sure we have the correct one.  Electronic prescriptions: In compliance with the [facility] Strengthen Opioid Misuse Prevention (STOP) Act of 2017 (Session Conni Elliot [phone]), effective August 31, 2018, all controlled substances must be electronically prescribed. Written prescriptions, faxing, or calling prescriptions to a pharmacy will no longer be done.  Prescription refills: These will be provided only during in-person appointments. No medications will be renewed without a "face-to-face" evaluation with your provider. Applies to all prescriptions.  NOTE: The following applies primarily to controlled substances (Opioid* Pain Medications).   Type of encounter (visit): For patients receiving controlled substances, face-to-face visits are required. (Not an option and not up to the patient.)  Patient's responsibilities:  Pain Pills: Bring all pain pills to every appointment (except for procedure appointments). Pill Bottles: Bring pills in original pharmacy bottle. Bring bottle, even if empty. Always bring the bottle of the most recent fill.  Medication refills: You are responsible for knowing and keeping track of what medications you are taking and when is it that you will need a refill. The day before your appointment: write a list of all prescriptions that need to be refilled. The day of the appointment: give the list to the admitting  nurse. Prescriptions will be written only during appointments. No prescriptions will be written on procedure days. If you forget a medication: it will not be "Called in", "Faxed", or "electronically sent". You will need to get another appointment to get these prescribed. No early refills. Do not call asking to have your prescription filled early. Partial  or short prescriptions: Occasionally your pharmacy may not have enough pills to fill your prescription.  NEVER ACCEPT a partial fill or a prescription that is short of the total amount of pills that you were prescribed.  With controlled substances the law allows 72 hours for the pharmacy to complete the prescription.  If the prescription is not completed within 72 hours, the pharmacist will require a new prescription to be written. This means that you will be short on your medicine and we WILL NOT send another prescription to complete your original prescription.  Instead, request the pharmacy to send a carrier to a nearby branch to get enough medication to provide you with your full prescription. Prescription Accuracy: You are responsible for carefully inspecting your prescriptions before leaving our office. Have the discharge nurse carefully go over each prescription with you, before taking them home. Make sure that your name is accurately spelled, that your address is correct. Check the name and dose of your medication to make sure it is accurate. Check the number of pills, and the written instructions to make sure they are clear and accurate. Make sure that you are given enough medication to last until your next medication refill appointment. Taking Medication: Take medication as prescribed. When it comes to controlled substances, taking less pills or less frequently than prescribed is permitted and encouraged. Never take more pills than instructed. Never take the medication more frequently than prescribed.  Inform other Doctors: Always inform, all of  your healthcare providers, of all the medications you take. Pain Medication from other Providers: You are not allowed to accept any additional pain medication from any other Doctor or Healthcare provider. There are two exceptions to this rule. (see below) In the event that you require additional pain medication, you are responsible for notifying us, as stated below. Cough Medicine: Often these contain an opioid, such as codeine or hydrocodone. Never accept or take cough medicine containing these opioids if you are already taking an opioid* medication. The combination may cause respiratory failure and death. Medication Agreement: You are responsible for carefully reading and following our Medication Agreement. This must be signed before receiving any prescriptions from our practice. Safely store a copy of your signed Agreement. Violations to the Agreement will result in no further prescriptions. (Additional copies of our Medication Agreement are available upon request.) Laws, Rules, & Regulations: All patients are expected to follow all [address] and [employer], Rules,  [place]. Ignorance of the Laws does not constitute a valid excuse.  Illegal drugs and Controlled Substances: The use of illegal substances (including, but not limited to marijuana and its derivatives) and/or the  illegal use of any controlled substances is strictly prohibited. Violation of this rule may result in the immediate and permanent discontinuation of any and all prescriptions being written by our practice. The use of any illegal substances is prohibited. Adopted CDC guidelines & recommendations: Target dosing levels will be at or below 60 MME/day. Use of benzodiazepines** is not recommended.  Exceptions: There are only two exceptions to the rule of not receiving pain medications from other Healthcare Providers. Exception #1 (Emergencies): In the event of an emergency (i.e.: accident requiring emergency care), you are  allowed to receive additional pain medication. However, you are responsible for: As soon as you are able, call our office [phone], at any time of the day or night, and leave a message stating your name, the date and nature of the emergency, and the name and dose of the medication prescribed. In the event that your call is answered by a member of our staff, make sure to document and save the date, time, and the name of the person that took your information.  Exception #2 (Planned Surgery): In the event that you are scheduled by another doctor or dentist to have any type of surgery or procedure, you are allowed (for a period no longer than 30 days), to receive additional pain medication, for the acute post-op pain. However, in this case, you are responsible for picking up a copy of our "Post-op Pain Management for Surgeons" handout, and giving it to your surgeon or dentist. This document is available at our office, and does not require an appointment to obtain it. Simply go to our office during business hours (Monday-Thursday from 8:00 AM to 4:00 PM) (Friday 8:00 AM to 12:00 Noon) or if you have a scheduled appointment with [place], prior to your surgery, and ask for it by name. In addition, you are responsible for: calling our office [phone], at any time of the day or night, and leaving a message stating your name, name of your surgeon, type of surgery, and date of procedure or surgery. Failure to comply with your responsibilities may result in termination of therapy involving the controlled substances. Medication Agreement Violation. Following the above rules, including your responsibilities will help you in avoiding a Medication Agreement Violation ("Breaking your Pain Medication Contract").  Consequences:  Not following the above rules may result in permanent discontinuation of medication prescription therapy.  *Opioid medications include: morphine, codeine, oxycodone, oxymorphone, hydrocodone,  hydromorphone, meperidine, tramadol, tapentadol, buprenorphine, fentanyl, methadone. **Benzodiazepine medications include: diazepam (Valium), alprazolam (Xanax), clonazepam (Klonopine), lorazepam (Ativan), clorazepate (Tranxene), chlordiazepoxide (Librium), estazolam (Prosom), oxazepam (Serax), temazepam (Restoril), triazolam (Halcion) (Last updated: 06/23/2022) ______________________________________________________________________      ______________________________________________________________________    Medication Recommendations and Reminders  Applies to: All patients receiving prescriptions (written and/or electronic).  Medication Rules & Regulations: You are responsible for reading, knowing, and following our "Medication Rules" document. These exist for your safety and that of others. They are not flexible and neither are we. Dismissing or ignoring them is an act of "non-compliance" that may result in complete and irreversible termination of such medication therapy. For safety reasons, "non-compliance" will not be tolerated. As with the [place] fundamental legal principle of "ignorance of the law is no defense", we will accept no excuses for not having read and knowing the content of documents provided to you by our practice.  Pharmacy of record:  Definition: This is the pharmacy where your electronic prescriptions will be sent.  We do not endorse any particular pharmacy. It  is up to you and your insurance to decide what pharmacy to use.  We do not restrict you in your choice of pharmacy. However, once we write for your prescriptions, we will NOT be re-sending more prescriptions to fix restricted supply problems created by your pharmacy, or your insurance.  The pharmacy listed in the electronic medical record should be the one where you want electronic prescriptions to be sent. If you choose to change pharmacy, simply notify our nursing staff. Changes will be made only during your regular  appointments and not over the phone.  Recommendations: Keep all of your pain medications in a safe place, under lock and key, even if you live alone. We will NOT replace lost, stolen, or damaged medication. We do not accept "Police Reports" as proof of medications having been stolen. After you fill your prescription, take 1 week's worth of pills and put them away in a safe place. You should keep a separate, properly labeled bottle for this purpose. The remainder should be kept in the original bottle. Use this as your primary supply, until it runs out. Once it's gone, then you know that you have 1 week's worth of medicine, and it is time to come in for a prescription refill. If you do this correctly, it is unlikely that you will ever run out of medicine. To make sure that the above recommendation works, it is very important that you make sure your medication refill appointments are scheduled at least 1 week before you run out of medicine. To do this in an effective manner, make sure that you do not leave the office without scheduling your next medication management appointment. Always ask the nursing staff to show you in your prescription , when your medication will be running out. Then arrange for the receptionist to get you a return appointment, at least 7 days before you run out of medicine. Do not wait until you have 1 or 2 pills left, to come in. This is very poor planning and does not take into consideration that we may need to cancel appointments due to bad weather, sickness, or emergencies affecting our staff. DO NOT ACCEPT A "Partial Fill": If for any reason your pharmacy does not have enough pills/tablets to completely fill or refill your prescription, do not allow for a "partial fill". The law allows the pharmacy to complete that prescription within 72 hours, without requiring a new prescription. If they do not fill the rest of your prescription within those 72 hours, you will need a separate  prescription to fill the remaining amount, which we will NOT provide. If the reason for the partial fill is your insurance, you will need to talk to the pharmacist about payment alternatives for the remaining tablets, but again, DO NOT ACCEPT A PARTIAL FILL, unless you can trust your pharmacist to obtain the remainder of the pills within 72 hours.  Prescription refills and/or changes in medication(s):  Prescription refills, and/or changes in dose or medication, will be conducted only during scheduled medication management appointments. (Applies to both, written and electronic prescriptions.) No refills on procedure days. No medication will be changed or started on procedure days. No changes, adjustments, and/or refills will be conducted on a procedure day. Doing so will interfere with the diagnostic portion of the procedure. No phone refills. No medications will be "called into the pharmacy". No Fax refills. No weekend refills. No Holliday refills. No after hours refills.  Remember:  Business hours are:  Monday to Thursday 8:00  AM to 4:00 PM Provider's Schedule: Delano Metz, MD - Appointments are:  Medication management: Monday and Wednesday 8:00 AM to 4:00 PM Procedure day: Tuesday and Thursday 7:30 AM to 4:00 PM Edward Jolly, MD - Appointments are:  Medication management: Tuesday and Thursday 8:00 AM to 4:00 PM Procedure day: Monday and Wednesday 7:30 AM to 4:00 PM (Last update: 06/23/2022) ______________________________________________________________________      ______________________________________________________________________    Drug Holidays  What is a "Drug Holiday"? Drug Holiday: is the name given to the process of slowly tapering down and temporarily stopping the pain medication for the purpose of decreasing or eliminating tolerance to the drug.  Benefits Improved effectiveness Decreased required effective dose Improved pain control End dependence on high dose  therapy Decrease cost of therapy Uncovering "opioid-induced hyperalgesia". (OIH)  What is "opioid hyperalgesia"? It is a paradoxical increase in pain caused by exposure to opioids. Stopping the opioid pain medication, contrary to the expected, it actually decreases or completely eliminates the pain. Ref.: "A comprehensive review of opioid-induced hyperalgesia". Donney Rankins, et.al. Pain Physician. 2011 Mar-Apr;14(2):145-61.  What is tolerance? Tolerance: the progressive loss of effectiveness of a pain medicine due to repetitive use. A common problem of opioid pain medications.  How long should a "Drug Holiday" last? Effectiveness depends on the patient staying off all opioid pain medicines for a minimum of 14 consecutive days. (2 weeks)  How about just taking less of the medicine? Does not work. Will not accomplish goal of eliminating the excess receptors.  How about switching to a different pain medicine? (AKA. "Opioid rotation") Does not work. Creates the illusion of effectiveness by taking advantage of inaccurate equivalent dose calculations between different opioids. -This "technique" was promoted by studies funded by [employer], such as [employer], creators of "OxyContin".  Can I stop the medicine "cold [place]"? We do not recommend it. You should always coordinate with your prescribing physician to make the transition as smoothly as possible. Avoid stopping the medicine abruptly without consulting. We recommend a "slow taper".  What is a slow taper? Taper: refers to the gradual decrease in dose.   How do I stop/taper the dose? Slowly. Decrease the daily amount of pills that you take by one (1) pill every seven (7) days. This is called a "slow downward taper". Example: if you normally take four (4) pills per day, drop it to three (3) pills per day for seven (7) days, then to two (2) pills per day for seven (7) days, then to one (1) per day for seven (7) days, and then  stop the medicine. The 14 day "Drug Holiday" starts on the first day without medicine.   Will I experience withdrawals? Unlikely with a slow taper.  What triggers withdrawals? Withdrawals are triggered by the sudden/abrupt stop of high dose opioids. Withdrawals can be avoided by slowly decreasing the dose over a prolonged period of time.  What are withdrawals? Symptoms associated with sudden/abrupt reduction/stopping of high-dose, long-term use of pain medication. Withdrawal are seldom seen on low dose therapy, or patients rarely taking opioid medication.  Early Withdrawal Symptoms may include: Agitation Anxiety Muscle aches Increased tearing Insomnia Runny nose Sweating Yawning  Late symptoms may include: Abdominal cramping Diarrhea Dilated pupils Goose bumps Nausea Vomiting  When could I see withdrawals? Onset: 8-24 hours after last use for most opioids. 12-48 hours for long-acting opioids (i.e.: methadone)  How long could they last? Duration: 4-10 days for most opioids. 14-21 days for long-acting opioids (i.e.: methadone)  What will happen after I complete my "Drug Holiday"? The need and indications for the opioid analgesic will be reviewed before restarting the medication. Dose requirements will likely decrease and the dose will need to be adjusted accordingly.   (Last update: 11/18/2022) ______________________________________________________________________      ______________________________________________________________________     Naloxone Nasal Spray  Why am I receiving this medication? [place] STOP ACT requires that all patients taking high dose opioids or at risk of opioids respiratory depression, be prescribed an opioid reversal agent, such as Naloxone (AKA: Narcan).  What is this medication? NALOXONE (nal OX one) treats opioid overdose, which causes slow or shallow breathing, severe drowsiness, or trouble staying awake. Call emergency services  after using this medication. You may need additional treatment. Naloxone works by reversing the effects of opioids. It belongs to a group of medications called opioid blockers.  COMMON BRAND NAME(S): Kloxxado, Narcan  What should I tell my care team before I take this medication? They need to know if you have any of these conditions: Heart disease Substance use disorder An unusual or allergic reaction to naloxone, other medications, foods, dyes, or preservatives Pregnant or trying to get pregnant Breast-feeding  When to use this medication? This medication is to be used for the treatment of respiratory depression (less than 8 breaths per minute) secondary to opioid overdose.   How to use this medication? This medication is for use in the nose. Lay the person on their back. Support their neck with your hand and allow the head to tilt back before giving the medication. The nasal spray should be given into 1 nostril. After giving the medication, move the person onto their side. Do not remove or test the nasal spray until ready to use. Get emergency medical help right away after giving the first dose of this medication, even if the person wakes up. You should be familiar with how to recognize the signs and symptoms of a narcotic overdose. If more doses are needed, give the additional dose in the other nostril. Talk to your care team about the use of this medication in children. While this medication may be prescribed for children as young as newborns for selected conditions, precautions do apply.  Naloxone Overdosage: If you think you have taken too much of this medicine contact a poison control center or emergency room at once.  NOTE: This medicine is only for you. Do not share this medicine with others.  What if I miss a dose? This does not apply.  What may interact with this medication? This is only used during an emergency. No interactions are expected during emergency use. This list may  not describe all possible interactions. Give your health care provider a list of all the medicines, herbs, non-prescription drugs, or dietary supplements you use. Also tell them if you smoke, drink alcohol, or use illegal drugs. Some items may interact with your medicine.  What should I watch for while using this medication? Keep this medication ready for use in the case of an opioid overdose. Make sure that you have the phone number of your care team and local hospital ready. You may need to have additional doses of this medication. Each nasal spray contains a single dose. Some emergencies may require additional doses. After use, bring the treated person to the nearest hospital or call 911. Make sure the treating care team knows that the person has received a dose of this medication. You will receive additional instructions on what to do  during and after use of this medication before an emergency occurs.  What side effects may I notice from receiving this medication? Side effects that you should report to your care team as soon as possible: Allergic reactions--skin rash, itching, hives, swelling of the face, lips, tongue, or throat Side effects that usually do not require medical attention (report these to your care team if they continue or are bothersome): Constipation Dryness or irritation inside the nose Headache Increase in blood pressure Muscle spasms Stuffy nose Toothache This list may not describe all possible side effects. Call your doctor for medical advice about side effects. You may report side effects to FDA at 1-800-FDA-1088.  Where should I keep my medication? Because this is an emergency medication, you should keep it with you at all times.  Keep out of the reach of children and pets. Store between 20 and 25 degrees C (68 and 77 degrees F). Do not freeze. Throw away any unused medication after the expiration date. Keep in original box until ready to use.  NOTE: This sheet is a  summary. It may not cover all possible information. If you have questions about this medicine, talk to your doctor, pharmacist, or health care provider.   2023 [employer] (2021-04-25 00:00:00)  ______________________________________________________________________

## 2023-05-12 ENCOUNTER — Telehealth: Payer: Self-pay

## 2023-05-12 NOTE — Telephone Encounter (Signed)
Post procedure follow up.  LM 

## 2023-05-14 MED FILL — Medication: INTRATHECAL | Qty: 1 | Status: AC

## 2023-06-14 ENCOUNTER — Other Ambulatory Visit: Payer: Self-pay | Admitting: Cardiology

## 2023-06-17 DIAGNOSIS — J0100 Acute maxillary sinusitis, unspecified: Secondary | ICD-10-CM | POA: Diagnosis not present

## 2023-07-05 ENCOUNTER — Other Ambulatory Visit: Payer: Self-pay

## 2023-07-05 MED ORDER — FUROSEMIDE 20 MG PO TABS: 20.0000 mg | ORAL_TABLET | Freq: Every day | ORAL | 0 refills | Status: AC

## 2023-07-21 DIAGNOSIS — H5203 Hypermetropia, bilateral: Secondary | ICD-10-CM | POA: Diagnosis not present

## 2023-07-21 DIAGNOSIS — H52209 Unspecified astigmatism, unspecified eye: Secondary | ICD-10-CM | POA: Diagnosis not present

## 2023-07-21 DIAGNOSIS — H524 Presbyopia: Secondary | ICD-10-CM | POA: Diagnosis not present

## 2023-07-23 ENCOUNTER — Other Ambulatory Visit: Payer: Self-pay

## 2023-07-23 MED ORDER — PAIN MANAGEMENT IT PUMP REFILL: 1.0000 | Freq: Once | INTRATHECAL | 0 refills | Status: AC

## 2023-07-24 ENCOUNTER — Other Ambulatory Visit: Payer: Self-pay | Admitting: Cardiology

## 2023-08-10 ENCOUNTER — Encounter: Payer: Self-pay | Admitting: Pain Medicine

## 2023-08-10 ENCOUNTER — Ambulatory Visit: Payer: Medicare HMO | Attending: Pain Medicine | Admitting: Pain Medicine

## 2023-08-10 VITALS — BP 146/74 | HR 63 | Temp 97.5°F | Resp 16 | Ht 63.0 in | Wt 170.0 lb

## 2023-08-10 DIAGNOSIS — Z79899 Other long term (current) drug therapy: Secondary | ICD-10-CM | POA: Insufficient documentation

## 2023-08-10 DIAGNOSIS — Z79891 Long term (current) use of opiate analgesic: Secondary | ICD-10-CM | POA: Insufficient documentation

## 2023-08-10 DIAGNOSIS — M961 Postlaminectomy syndrome, not elsewhere classified: Secondary | ICD-10-CM | POA: Insufficient documentation

## 2023-08-10 DIAGNOSIS — G894 Chronic pain syndrome: Secondary | ICD-10-CM | POA: Diagnosis not present

## 2023-08-10 DIAGNOSIS — M5441 Lumbago with sciatica, right side: Secondary | ICD-10-CM | POA: Diagnosis not present

## 2023-08-10 DIAGNOSIS — M5442 Lumbago with sciatica, left side: Secondary | ICD-10-CM | POA: Diagnosis not present

## 2023-08-10 DIAGNOSIS — M5417 Radiculopathy, lumbosacral region: Secondary | ICD-10-CM | POA: Insufficient documentation

## 2023-08-10 DIAGNOSIS — Z9682 Presence of neurostimulator: Secondary | ICD-10-CM | POA: Diagnosis not present

## 2023-08-10 DIAGNOSIS — M51362 Other intervertebral disc degeneration, lumbar region with discogenic back pain and lower extremity pain: Secondary | ICD-10-CM | POA: Insufficient documentation

## 2023-08-10 DIAGNOSIS — G8929 Other chronic pain: Secondary | ICD-10-CM | POA: Insufficient documentation

## 2023-08-10 DIAGNOSIS — M79604 Pain in right leg: Secondary | ICD-10-CM | POA: Diagnosis not present

## 2023-08-10 DIAGNOSIS — M47816 Spondylosis without myelopathy or radiculopathy, lumbar region: Secondary | ICD-10-CM | POA: Insufficient documentation

## 2023-08-10 DIAGNOSIS — M79605 Pain in left leg: Secondary | ICD-10-CM | POA: Insufficient documentation

## 2023-08-10 DIAGNOSIS — Z978 Presence of other specified devices: Secondary | ICD-10-CM | POA: Diagnosis not present

## 2023-08-10 DIAGNOSIS — Z451 Encounter for adjustment and management of infusion pump: Secondary | ICD-10-CM | POA: Diagnosis not present

## 2023-08-10 MED ORDER — MORPHINE SULFATE ER 30 MG PO TBCR: 30.0000 mg | EXTENDED_RELEASE_TABLET | Freq: Two times a day (BID) | ORAL | 0 refills | Status: DC

## 2023-08-10 MED ORDER — NALOXONE HCL 4 MG/0.1ML NA LIQD: 1.0000 | NASAL | 0 refills | Status: DC | PRN

## 2023-08-10 NOTE — Progress Notes (Signed)
Nursing Pain Medication Assessment:  Safety precautions to be maintained throughout the outpatient stay will include: orient to surroundings, keep bed in low position, maintain call bell within reach at all times, provide assistance with transfer out of bed and ambulation.  Medication Inspection Compliance: Pill count conducted under aseptic conditions, in front of the patient. Neither the pills nor the bottle was removed from the patient's sight at any time. Once count was completed pills were immediately returned to the patient in their original bottle.  Medication: Morphine IR Pill/Patch Count:  0 of 60 pills remain Pill/Patch Appearance: Markings consistent with prescribed medication Bottle Appearance: Standard pharmacy container. Clearly labeled. Filled Date: 71 / 10 / 2024 Last Medication intake:  Today

## 2023-08-10 NOTE — Patient Instructions (Addendum)
Patient has Narcan and the family is aware fo how to use it.   Opioid Overdose Opioids are drugs that are often used to treat pain. Opioids include illegal drugs, such as heroin, as well as prescription pain medicines, such as codeine, morphine, hydrocodone, and fentanyl. An opioid overdose happens when you take too much of an opioid. An overdose may be intentional or accidental and can happen with any type of opioid. The effects of an overdose can be mild, dangerous, or even deadly. Opioid overdose is a medical emergency. What are the causes? This condition may be caused by: Taking too much of an opioid on purpose. Taking too much of an opioid by accident. Using two or more substances that contain opioids at the same time. Taking an opioid with a substance that affects your heart, breathing, or blood pressure. These include alcohol, tranquilizers, sleeping pills, illegal drugs, and some over-the-counter medicines. This condition may also happen due to an error made by: A health care provider who prescribes a medicine. The pharmacist who fills the prescription. What increases the risk? This condition is more likely in: Children. They may be attracted to colorful pills. Because of a child's small size, even a small amount of a medicine can be dangerous. Older people. They may be taking many different medicines. Older people may have difficulty reading labels or remembering when they last took their medicines. They may also be more sensitive to the effects of opioids. People with chronic medical conditions, especially heart, liver, kidney, or neurological diseases. People who take an opioid for a long period of time. People who take opioids and use illegal drugs, such as heroin, or other substances, such as alcohol. People who: Have a history of drug or alcohol abuse. Have certain mental health conditions. Have a history of previous drug overdoses. People who take opioids that are not  prescribed for them. What are the signs or symptoms? Symptoms of this condition depend on the type of opioid and the amount that was taken. Common symptoms include: Sleepiness or difficulty waking from sleep. Confusion. Slurred speech. Slowed breathing and a slow pulse (bradycardia). Nausea and vomiting. Abnormally small pupils. Signs and symptoms that require emergency treatment include: Cold, clammy, and pale skin. Blue lips and fingernails. Vomiting. Gurgling sounds in the throat. A pulse that is very slow or difficult to detect. Breathing that is very irregular, slow, noisy, or difficult to detect. Inability to respond to speech or be awakened from sleep (stupor). Seizures. How is this diagnosed? This condition is diagnosed based on your symptoms and medical history. It is important to tell your health care provider: About all of the opioids that you took. When you took the opioids. Whether you were drinking alcohol or using marijuana, cocaine, or other drugs. Your health care provider will do a physical exam. This exam may include: Checking and monitoring your heart rate and rhythm, breathing rate, temperature, and blood pressure. Measuring oxygen levels in your blood. Checking for abnormally small pupils. You may also have blood tests or urine tests. You may have X-rays if you are having severe breathing problems. How is this treated? This condition requires immediate medical treatment and hospitalization. Reversing the effects of the opioid is the first step in treatment. If you have a Narcan kit or naloxone, use it right away. Follow your health care provider's instructions. A friend or family member can also help you with this. The rest of your treatment will be given in the hospital intensive care (ICU).  Treatment in the hospital may include: Giving salts and minerals (electrolytes) along with fluids through an IV. Inserting a breathing tube (endotracheal tube) in your  airway to help you breathe if you cannot breathe on your own or you are in danger of not being able to breathe on your own. Giving oxygen through a small tube under your nose. Passing a tube through your nose and into your stomach (nasogastric tube, or NG tube) to empty your stomach. Giving medicines that: Increase your blood pressure. Relieve nausea and vomiting. Relieve abdominal pain and cramping. Reverse the effects of the opioid (naloxone). Monitoring your heart and oxygen levels. Ongoing counseling and mental health support if you intentionally overdosed or used an illegal drug. Follow these instructions at home:  Medicines Take over-the-counter and prescription medicines only as told by your health care provider. Always ask your health care provider about possible side effects and interactions of any new medicine that you start taking. Keep a list of all the medicines that you take, including over-the-counter medicines. Bring this list with you to all your medical visits. General instructions Drink enough fluid to keep your urine pale yellow. Keep all follow-up visits. This is important. How is this prevented? Read the drug inserts that come with your opioid pain medicines. Take medicines only as told by your health care provider. Do not take more medicine than you are told. Do not take medicines more frequently than you are told. Do not drink alcohol or take sedatives when taking opioids. Do not use illegal or recreational drugs, including cocaine, ecstasy, and marijuana. Do not take opioid medicines that are not prescribed for you. Store all medicines in safety containers that are out of the reach of children. Get help if you are struggling with: Alcohol or drug use. Depression or another mental health problem. Thoughts of hurting yourself or another person. Keep the phone number of your local poison control center near your phone or in your mobile phone. In the [place] the  hotline of the [facility] is 508-648-4991. If you were prescribed naloxone, make sure you understand how to take it. Contact a health care provider if: You need help understanding how to take your pain medicines. You feel your medicines are too strong. You are concerned that your pain medicines are not working well for your pain. You develop new symptoms or side effects when you are taking medicines. Get help right away if: You or someone else is having symptoms of an opioid overdose. Get help even if you are not sure. You have thoughts about hurting yourself or others. You have: Chest pain. Difficulty breathing. A loss of consciousness. These symptoms may represent a serious problem that is an emergency. Do not wait to see if the symptoms will go away. Get medical help right away. Call your local emergency services (911 in the [place] Do not drive yourself to the hospital. If you ever feel like you may hurt yourself or others, or have thoughts about taking your own life, get help right away. You can go to your nearest emergency department or: Call your local emergency services (911 in the [place] Call a suicide crisis helpline, such as the National Suicide Prevention Lifeline at 226-523-4047 or 988 in the [place] This is open 24 hours a day in the [place] Text the Crisis Text Line at 4630778204 (in the [place] Summary Opioids are drugs that are often used to treat pain. Opioids include illegal drugs, such as heroin, as well  as prescription pain medicines. An opioid overdose happens when you take too much of an opioid. Overdoses can be intentional or accidental. Opioid overdose is very dangerous. It is a life-threatening emergency. If you or someone you know is experiencing an opioid overdose, get help right away. This information is not intended to replace advice given to you by your health care provider. Make sure you discuss any questions you have with your health care  provider. Document Revised: 03/12/2021 Document Reviewed: 11/27/2020 Elsevier Patient Education  2024 [employer]  ______________________________________________________________________    Opioid Pain Medication Update  To: All patients taking opioid pain medications. (I.e.: hydrocodone, hydromorphone, oxycodone, oxymorphone, morphine, codeine, methadone, tapentadol, tramadol, buprenorphine, fentanyl, etc.)  Re: Updated review of side effects and adverse reactions of opioid analgesics, as well as new information about long term effects of this class of medications.  Direct risks of long-term opioid therapy are not limited to opioid addiction and overdose. Potential medical risks include serious fractures, breathing problems during sleep, hyperalgesia, immunosuppression, chronic constipation, bowel obstruction, myocardial infarction, and tooth decay secondary to xerostomia.  Unpredictable adverse effects that can occur even if you take your medication correctly: Cognitive impairment, respiratory depression, and death. Most people think that if they take their medication "correctly", and "as instructed", that they will be safe. Nothing could be farther from the truth. In reality, a significant amount of recorded deaths associated with the use of opioids has occurred in individuals that had taken the medication for a long time, and were taking their medication correctly. The following are examples of how this can happen: Patient taking his/her medication for a long time, as instructed, without any side effects, is given a certain antibiotic or another unrelated medication, which in turn triggers a "Drug-to-drug interaction" leading to disorientation, cognitive impairment, impaired reflexes, respiratory depression or an untoward event leading to serious bodily harm or injury, including death.  Patient taking his/her medication for a long time, as instructed, without any side effects, develops an  acute impairment of liver and/or kidney function. This will lead to a rapid inability of the body to breakdown and eliminate their pain medication, which will result in effects similar to an "overdose", but with the same medicine and dose that they had always taken. This again may lead to disorientation, cognitive impairment, impaired reflexes, respiratory depression or an untoward event leading to serious bodily harm or injury, including death.  A similar problem will occur with patients as they grow older and their liver and kidney function begins to decrease as part of the aging process.  Background information: Historically, the original case for using long-term opioid therapy to treat chronic noncancer pain was based on safety assumptions that subsequent experience has called into question. In 1996, the American Pain Society and the American Academy of Pain Medicine issued a consensus statement supporting long-term opioid therapy. This statement acknowledged the dangers of opioid prescribing but concluded that the risk for addiction was low; respiratory depression induced by opioids was short-lived, occurred mainly in opioid-naive patients, and was antagonized by pain; tolerance was not a common problem; and efforts to control diversion should not constrain opioid prescribing. This has now proven to be wrong. Experience regarding the risks for opioid addiction, misuse, and overdose in community practice has failed to support these assumptions.  According to the Centers for Disease Control and Prevention, fatal overdoses involving opioid analgesics have increased sharply over the past decade. Currently, more than 96,700 people die from drug overdoses every year. Opioids  are a factor in 7 out of every 10 overdose deaths. Deaths from drug overdose have surpassed motor vehicle accidents as the leading cause of death for individuals between the ages of 47 and 30.  Clinical data suggest that neuroendocrine  dysfunction may be very common in both men and women, potentially causing hypogonadism, erectile dysfunction, infertility, decreased libido, osteoporosis, and depression. Recent studies linked higher opioid dose to increased opioid-related mortality. Controlled observational studies reported that long-term opioid therapy may be associated with increased risk for cardiovascular events. Subsequent meta-analysis concluded that the safety of long-term opioid therapy in elderly patients has not been proven.   Side Effects and adverse reactions: Common side effects: Drowsiness (sedation). Dizziness. Nausea and vomiting. Constipation. Physical dependence -- Dependence often manifests with withdrawal symptoms when opioids are discontinued or decreased. Tolerance -- As you take repeated doses of opioids, you require increased medication to experience the same effect of pain relief. Respiratory depression -- This can occur in healthy people, especially with higher doses. However, people with COPD, asthma or other lung conditions may be even more susceptible to fatal respiratory impairment.  Uncommon side effects: An increased sensitivity to feeling pain and extreme response to pain (hyperalgesia). Chronic use of opioids can lead to this. Delayed gastric emptying (the process by which the contents of your stomach are moved into your small intestine). Muscle rigidity. Immune system and hormonal dysfunction. Quick, involuntary muscle jerks (myoclonus). Arrhythmia. Itchy skin (pruritus). Dry mouth (xerostomia).  Long-term side effects: Chronic constipation. Sleep-disordered breathing (SDB). Increased risk of bone fractures. Hypothalamic-pituitary-adrenal dysregulation. Increased risk of overdose.  RISKS: Respiratory depression and death: Opioids increase the risk of respiratory depression and death.  Drug-to-drug interactions: Opioids are relatively contraindicated in combination with  benzodiazepines, sleep inducers, and other central nervous system depressants. Other classes of medications (i.e.: certain antibiotics and even over-the-counter medications) may also trigger or induce respiratory depression in some patients.  Medical conditions: Patients with pre-existing respiratory problems are at higher risk of respiratory failure and/or depression when in combination with opioid analgesics. Opioids are relatively contraindicated in some medical conditions such as central sleep apnea.   Fractures and Falls:  Opioids increase the risk and incidence of falls. This is of particular importance in elderly patients.  Endocrine System:  Long-term administration is associated with endocrine abnormalities (endocrinopathies). (Also known as Opioid-induced Endocrinopathy) Influences on both the hypothalamic-pituitary-adrenal axis?and the hypothalamic-pituitary-gonadal axis have been demonstrated with consequent hypogonadism and adrenal insufficiency in both sexes. Hypogonadism and decreased levels of dehydroepiandrosterone sulfate have been reported in men and women. Endocrine effects include: Amenorrhoea in women (abnormal absence of menstruation) Reduced libido in both sexes Decreased sexual function Erectile dysfunction in men Hypogonadisms (decreased testicular function with shrinkage of testicles) Infertility Depression and fatigue Loss of muscle mass Anxiety Depression Immune suppression Hyperalgesia Weight gain Anemia Osteoporosis Patients (particularly women of childbearing age) should avoid opioids. There is insufficient evidence to recommend routine monitoring of asymptomatic patients taking opioids in the long-term for hormonal deficiencies.  Immune System: Human studies have demonstrated that opioids have an immunomodulating effect. These effects are mediated via opioid receptors both on immune effector cells and in the central nervous system. Opioids have been  demonstrated to have adverse effects on antimicrobial response and anti-tumour surveillance. Buprenorphine has been demonstrated to have no impact on immune function.  Opioid Induced Hyperalgesia: Human studies have demonstrated that prolonged use of opioids can lead to a state of abnormal pain sensitivity, sometimes called opioid induced hyperalgesia (OIH). Opioid induced  hyperalgesia is not usually seen in the absence of tolerance to opioid analgesia. Clinically, hyperalgesia may be diagnosed if the patient on long-term opioid therapy presents with increased pain. This might be qualitatively and anatomically distinct from pain related to disease progression or to breakthrough pain resulting from development of opioid tolerance. Pain associated with hyperalgesia tends to be more diffuse than the pre-existing pain and less defined in quality. Management of opioid induced hyperalgesia requires opioid dose reduction.  Cancer: Chronic opioid therapy has been associated with an increased risk of cancer among noncancer patients with chronic pain. This association was more evident in chronic strong opioid users. Chronic opioid consumption causes significant pathological changes in the small intestine and colon. Epidemiological studies have found that there is a link between opium dependence and initiation of gastrointestinal cancers. Cancer is the second leading cause of death after cardiovascular disease. Chronic use of opioids can cause multiple conditions such as GERD, immunosuppression and renal damage as well as carcinogenic effects, which are associated with the incidence of cancers.   Mortality: Long-term opioid use has been associated with increased mortality among patients with chronic non-cancer pain (CNCP).  Prescription of long-acting opioids for chronic noncancer pain was associated with a significantly increased risk of all-cause mortality, including deaths from causes other than  overdose.  Reference: Von Korff M, Kolodny A, Deyo RA, Chou R. Long-term opioid therapy reconsidered. Ann Intern Med. 2011 Sep 6;155(5):325-8. doi: 10.7326/[id number]. PMID: 29562130; PMCID: [id number]. Randon Goldsmith, Hayward RA, Dunn KM, [place] KP. Risk of adverse events in patients prescribed long-term opioids: A cohort study in the [place] [facility] [employer]. Eur J Pain. 2019 May;23(5):908-922. doi: 10.1002/ejp.1357. Epub 2019 Jan 31. PMID: 95284132. Colameco S, Coren JS, Ciervo CA. Continuous opioid treatment for chronic noncancer pain: a time for moderation in prescribing. Postgrad Med. 2009 Jul;121(4):61-6. doi: 10.3810/pgm.2009.07.2032. PMID: 44010272. William Hamburger RN, [place] SD, Blazina I, Cristopher Peru, Bougatsos C, Deyo RA. The effectiveness and risks of long-term opioid therapy for chronic pain: a systematic review for a [employer] to [employer]. Ann Intern Med. 2015 Feb 17;162(4):276-86. doi: 10.7326/M14-2559. PMID: 53664403. Caryl Bis [facility], Makuc DM. NCHS Data Brief No. 22. [place]: Centers for Disease Control and Prevention; 2009. Sep, Increase in Fatal Poisonings Involving Opioid Analgesics in the [place], 1999-2006. Song IA, Choi HR, Oh TK. Long-term opioid use and mortality in patients with chronic non-cancer pain: Ten-year follow-up study in [place] from 2010 through 2019. EClinicalMedicine. 2022 Jul 18;51:101558. doi: 10.1016/j.eclinm.[id number]. PMID: 56387564; PMCID: [id number]. Huser, W., Schubert, T., Vogelmann, T. et al. All-cause mortality in patients with long-term opioid therapy compared with non-opioid analgesics for chronic non-cancer pain: a database study. BMC Med 18, 162 (2020). http://lester.info/ Rashidian H, Karie Kirks, Malekzadeh R, Haghdoost AA. An Ecological Study of the Association between Opiate Use and Incidence  of Cancers. Addict Health. 2016 Fall;8(4):252-260. PMID: 66063016; PMCID: [id number].  Our Goal: Our goal is to control your pain with means other than the use of opioid pain medications.  Our Recommendation: Talk to your physician about coming off of these medications. We can assist you with the tapering down and stopping these medicines. Based on the new information, even if you cannot completely stop the medication, a decrease in the dose may be associated with a lesser risk. Ask for other means of controlling the pain. Decrease or eliminate those factors that significantly contribute to your pain such as smoking,  obesity, and a diet heavily tilted towards "inflammatory" nutrients.  Last Updated: 03/08/2023   ______________________________________________________________________       ______________________________________________________________________    National Pain Medication Shortage  The [place] experiencing worsening drug shortages. These have had a negative widespread effect on patient care and treatment. Not expected to improve any time soon. Predicted to last past 2029.   Drug shortage list (generic names) Oxycodone IR Oxycodone/APAP Oxymorphone IR Hydromorphone Hydrocodone/APAP Morphine  Where is the problem?  Manufacturing and supply level.  Will this shortage affect you?  Only if you take any of the above pain medications.  How? You may be unable to fill your prescription.  Your pharmacist may offer a "partial fill" of your prescription. (Warning: Do not accept partial fills.) Prescriptions partially filled cannot be transferred to another pharmacy. Read our Medication Rules and Regulation. Depending on how much medicine you are dependent on, you may experience withdrawals when unable to get the medication.  Recommendations: Consider ending your dependence on opioid pain medications. Ask your pain specialist to assist you with the process. Consider  switching to a medication currently not in shortage, such as Buprenorphine. Talk to your pain specialist about this option. Consider decreasing your pain medication requirements by managing tolerance thru "Drug Holidays". This may help minimize withdrawals, should you run out of medicine. Control your pain thru the use of non-pharmacological interventional therapies.   Your prescriber: Prescribers cannot be blamed for shortages. Medication manufacturing and supply issues cannot be fixed by the prescriber.   NOTE: The prescriber is not responsible for supplying the medication, or solving supply issues. Work with your pharmacist to solve it. The patient is responsible for the decision to take or continue taking the medication and for identifying and securing a legal supply source. By law, supplying the medication is the job and responsibility of the pharmacy. The prescriber is responsible for the evaluation, monitoring, and prescribing of these medications.   Prescribers will NOT: Re-issue prescriptions that have been partially filled. Re-issue prescriptions already sent to a pharmacy.  Re-send prescriptions to a different pharmacy because yours did not have your medication. Ask pharmacist to order more medicine or transfer the prescription to another pharmacy. (Read below.)  New 2023 regulation: "May 01, 2022 Revised Regulation Allows DEA-Registered Pharmacies to Transfer Electronic Prescriptions at a Patient's Request DEA Headquarters Division - Public Information Office Patients now have the ability to request their electronic prescription be transferred to another pharmacy without having to go back to their practitioner to initiate the request. This revised regulation went into effect on Monday, April 27, 2022.     At a patient's request, a DEA-registered retail pharmacy can now transfer an electronic prescription for a controlled substance (schedules II-V) to another DEA-registered  retail pharmacy. Prior to this change, patients would have to go through their practitioner to cancel their prescription and have it re-issued to a different pharmacy. The process was taxing and time consuming for both patients and practitioners.    The Drug Enforcement Administration [facility]) published its intent to revise the process for transferring electronic prescriptions on July 19, 2020.  The final rule was published in the federal register on March 26, 2022 and went into effect 30 days later.  Under the final rule, a prescription can only be transferred once between pharmacies, and only if allowed under existing state or other applicable law. The prescription must remain in its electronic form; may not be altered in any way; and the transfer must be  communicated directly between two licensed pharmacists. It's important to note, any authorized refills transfer with the original prescription, which means the entire prescription will be filled at the same pharmacy".  Reference: [email or web address] [facility] website announcement)  [email or web address] [employer] pl[employer])   [employer] / Vol. 88, No. 143 / Thursday, March 26, 2022 / Rules and Regulations DEPARTMENT OF JUSTICE  Drug Enforcement Administration  21 CFR Part 1306  [Docket No. [id number]]  RIN [id number] Transfer of Electronic Prescriptions for Schedules II-V Controlled Substances Between Pharmacies for Initial Filling  ______________________________________________________________________       ______________________________________________________________________    Transfer of Pain Medication between Pharmacies  Re: 2023 DEA Clarification on existing regulation  Published on DEA Website: May 01, 2022  Title: Revised Regulation Allows DEA-Registered  Pharmacies to Electrical engineer Prescriptions at a Patient's Request DEA Headquarters Division - [employer]  "Patients now have the ability to request their electronic prescription be transferred to another pharmacy without having to go back to their practitioner to initiate the request. This revised regulation went into effect on Monday, April 27, 2022.     At a patient's request, a DEA-registered retail pharmacy can now transfer an electronic prescription for a controlled substance (schedules II-V) to another DEA-registered retail pharmacy. Prior to this change, patients would have to go through their practitioner to cancel their prescription and have it re-issued to a different pharmacy. The process was taxing and time consuming for both patients and practitioners.    The Drug Enforcement Administration [facility]) published its intent to revise the process for transferring electronic prescriptions on July 19, 2020.  The final rule was published in the federal register on March 26, 2022 and went into effect 30 days later.  Under the final rule, a prescription can only be transferred once between pharmacies, and only if allowed under existing state or other applicable law. The prescription must remain in its electronic form; may not be altered in any way; and the transfer must be communicated directly between two licensed pharmacists. It's important to note, any authorized refills transfer with the original prescription, which means the entire prescription will be filled at the same pharmacy."    REFERENCES: 1. DEA website announcement [email or web address]  2. Department of Justice website  [email or web address]  3. DEPARTMENT OF JUSTICE Drug Enforcement Administration 21 CFR Part 1306 [Docket No. [id number]] RIN [id number] "Transfer of  Electronic Prescriptions for Schedules II-V Controlled Substances Between Pharmacies for Initial Filling"  ______________________________________________________________________       ______________________________________________________________________    Medication Rules  Purpose: To inform patients, and their family members, of our medication rules and regulations.  Applies to: All patients receiving prescriptions from our practice (written or electronic).  Pharmacy of record: This is the pharmacy where your electronic prescriptions will be sent. Make sure we have the correct one.  Electronic prescriptions: In compliance with the [employer] Streng[employer] Opioid Misuse Prevention (STOP) Act of 2017 (Session Conni Elliot [phone]), effective August 31, 2018, all controlled substances must be electronically prescribed. Written prescriptions, faxing, or calling prescriptions to a pharmacy will no longer be done.  Prescription refills: These will be provided only during in-person appointments. No medications will be renewed without a "face-to-face" evaluation with your provider. Applies to all prescriptions.  NOTE: The following applies primarily to controlled substances (Opioid* Pain Medications).   Type of encounter (visit): For patients receiving controlled substances, face-to-face visits are required. (Not an option and not up to the patient.)  Patient's Responsibilities: Pain  Pills: Bring all pain pills to every appointment (except for procedure appointments). Pill counts are required.  Pill Bottles: Bring pills in original pharmacy bottle. Bring bottle, even if empty. Always bring the bottle of the most recent fill.  Medication refills: You are responsible for knowing and keeping track of what medications you are taking and when is it that you will need a refill. The day before your appointment: write a list of all prescriptions that need to be refilled. The day of the appointment: give  the list to the admitting nurse. Prescriptions will be written only during appointments. No prescriptions will be written on procedure days. If you forget a medication: it will not be "Called in", "Faxed", or "electronically sent". You will need to get another appointment to get these prescribed. No early refills. Do not call asking to have your prescription filled early. Partial  or short prescriptions: Occasionally your pharmacy may not have enough pills to fill your prescription.  NEVER ACCEPT a partial fill or a prescription that is short of the total amount of pills that you were prescribed.  With controlled substances the law allows 72 hours for the pharmacy to complete the prescription.  If the prescription is not completed within 72 hours, the pharmacist will require a new prescription to be written. This means that you will be short on your medicine and we WILL NOT send another prescription to complete your original prescription.  Instead, request the pharmacy to send a carrier to a nearby branch to get enough medication to provide you with your full prescription. Prescription Accuracy: You are responsible for carefully inspecting your prescriptions before leaving our office. Have the discharge nurse carefully go over each prescription with you, before taking them home. Make sure that your name is accurately spelled, that your address is correct. Check the name and dose of your medication to make sure it is accurate. Check the number of pills, and the written instructions to make sure they are clear and accurate. Make sure that you are given enough medication to last until your next medication refill appointment. Taking Medication: Take medication as prescribed. When it comes to controlled substances, taking less pills or less frequently than prescribed is permitted and encouraged. Never take more pills than instructed. Never take the medication more frequently than prescribed.  Inform other  Doctors: Always inform, all of your healthcare providers, of all the medications you take. Pain Medication from other Providers: You are not allowed to accept any additional pain medication from any other Doctor or Healthcare provider. There are two exceptions to this rule. (see below) In the event that you require additional pain medication, you are responsible for notifying us, as stated below. Cough Medicine: Often these contain an opioid, such as codeine or hydrocodone. Never accept or take cough medicine containing these opioids if you are already taking an opioid* medication. The combination may cause respiratory failure and death. Medication Agreement: You are responsible for carefully reading and following our Medication Agreement. This must be signed before receiving any prescriptions from our practice. Safely store a copy of your signed Agreement. Violations to the Agreement will result in no further prescriptions. (Additional copies of our Medication Agreement are available upon request.) Laws, Rules, & Regulations: All patients are expected to follow all [address] and [employer], Rules, [place] [employer]. Ignorance of the Laws does not constitute a valid excuse.  Illegal drugs and Controlled Substances: The use of illegal substances (including, but not limited to marijuana and  its derivatives) and/or the illegal use of any controlled substances is strictly prohibited. Violation of this rule may result in the immediate and permanent discontinuation of any and all prescriptions being written by our practice. The use of any illegal substances is prohibited. Adopted CDC guidelines & recommendations: Target dosing levels will be at or below 60 MME/day. Use of benzodiazepines** is not recommended. Urine Drug testing: Patients taking controlled substances will be required to provide a urine sample upon request. Do not void before coming to your medication management appointments. Hold emptying your  bladder until you are admitted. The admitting nurse will inform you if a sample is required. Our practice reserves the right to call you at any time to provide a sample. Once receiving the call, you have 24 hours to comply with request. Not providing a sample upon request may result in termination of medication therapy.  Exceptions: There are only two exceptions to the rule of not receiving pain medications from other Healthcare Providers. Exception #1 (Emergencies): In the event of an emergency (i.e.: accident requiring emergency care), you are allowed to receive additional pain medication. However, you are responsible for: As soon as you are able, call our office [phone], at any time of the day or night, and leave a message stating your name, the date and nature of the emergency, and the name and dose of the medication prescribed. In the event that your call is answered by a member of our staff, make sure to document and save the date, time, and the name of the person that took your information.  Exception #2 (Planned Surgery): In the event that you are scheduled by another doctor or dentist to have any type of surgery or procedure, you are allowed (for a period no longer than 30 days), to receive additional pain medication, for the acute post-op pain. However, in this case, you are responsible for picking up a copy of our "Post-op Pain Management for Surgeons" handout, and giving it to your surgeon or dentist. This document is available at our office, and does not require an appointment to obtain it. Simply go to our office during business hours (Monday-Thursday from 8:00 AM to 4:00 PM) (Friday 8:00 AM to 12:00 Noon) or if you have a scheduled appointment with [place], prior to your surgery, and ask for it by name. In addition, you are responsible for: calling our office [phone], at any time of the day or night, and leaving a message stating your name, name of your surgeon, type of surgery, and date  of procedure or surgery. Failure to comply with your responsibilities may result in termination of therapy involving the controlled substances.  Consequences:  Non-compliance with the above rules may result in permanent discontinuation of medication prescription therapy. All patients receiving any type of controlled substance is expected to comply with the above patient responsibilities. Not doing so may result in permanent discontinuation of medication prescription therapy. Medication Agreement Violation. Following the above rules, including your responsibilities will help you in avoiding a Medication Agreement Violation ("Breaking your Pain Medication Contract").  *Opioid medications include: morphine, codeine, oxycodone, oxymorphone, hydrocodone, hydromorphone, meperidine, tramadol, tapentadol, buprenorphine, fentanyl, methadone. **Benzodiazepine medications include: diazepam (Valium), alprazolam (Xanax), clonazepam (Klonopine), lorazepam (Ativan), clorazepate (Tranxene), chlordiazepoxide (Librium), estazolam (Prosom), oxazepam (Serax), temazepam (Restoril), triazolam (Halcion) (Last updated: 06/23/2023) ______________________________________________________________________      ______________________________________________________________________    Medication Recommendations and Reminders  Applies to: All patients receiving prescriptions (written and/or electronic).  Medication Rules & Regulations: You  are responsible for reading, knowing, and following our "Medication Rules" document. These exist for your safety and that of others. They are not flexible and neither are we. Dismissing or ignoring them is an act of "non-compliance" that may result in complete and irreversible termination of such medication therapy. For safety reasons, "non-compliance" will not be tolerated. As with the [place] fundamental legal principle of "ignorance of the law is no defense", we will accept no excuses for not  having read and knowing the content of documents provided to you by our practice.  Pharmacy of record:  Definition: This is the pharmacy where your electronic prescriptions will be sent.  We do not endorse any particular pharmacy. It is up to you and your insurance to decide what pharmacy to use.  We do not restrict you in your choice of pharmacy. However, once we write for your prescriptions, we will NOT be re-sending more prescriptions to fix restricted supply problems created by your pharmacy, or your insurance.  The pharmacy listed in the electronic medical record should be the one where you want electronic prescriptions to be sent. If you choose to change pharmacy, simply notify our nursing staff. Changes will be made only during your regular appointments and not over the phone.  Recommendations: Keep all of your pain medications in a safe place, under lock and key, even if you live alone. We will NOT replace lost, stolen, or damaged medication. We do not accept "Police Reports" as proof of medications having been stolen. After you fill your prescription, take 1 week's worth of pills and put them away in a safe place. You should keep a separate, properly labeled bottle for this purpose. The remainder should be kept in the original bottle. Use this as your primary supply, until it runs out. Once it's gone, then you know that you have 1 week's worth of medicine, and it is time to come in for a prescription refill. If you do this correctly, it is unlikely that you will ever run out of medicine. To make sure that the above recommendation works, it is very important that you make sure your medication refill appointments are scheduled at least 1 week before you run out of medicine. To do this in an effective manner, make sure that you do not leave the office without scheduling your next medication management appointment. Always ask the nursing staff to show you in your prescription , when your medication  will be running out. Then arrange for the receptionist to get you a return appointment, at least 7 days before you run out of medicine. Do not wait until you have 1 or 2 pills left, to come in. This is very poor planning and does not take into consideration that we may need to cancel appointments due to bad weather, sickness, or emergencies affecting our staff. DO NOT ACCEPT A "Partial Fill": If for any reason your pharmacy does not have enough pills/tablets to completely fill or refill your prescription, do not allow for a "partial fill". The law allows the pharmacy to complete that prescription within 72 hours, without requiring a new prescription. If they do not fill the rest of your prescription within those 72 hours, you will need a separate prescription to fill the remaining amount, which we will NOT provide. If the reason for the partial fill is your insurance, you will need to talk to the pharmacist about payment alternatives for the remaining tablets, but again, DO NOT ACCEPT A PARTIAL FILL, unless you  can trust your pharmacist to obtain the remainder of the pills within 72 hours.  Prescription refills and/or changes in medication(s):  Prescription refills, and/or changes in dose or medication, will be conducted only during scheduled medication management appointments. (Applies to both, written and electronic prescriptions.) No refills on procedure days. No medication will be changed or started on procedure days. No changes, adjustments, and/or refills will be conducted on a procedure day. Doing so will interfere with the diagnostic portion of the procedure. No phone refills. No medications will be "called into the pharmacy". No Fax refills. No weekend refills. No Holliday refills. No after hours refills.  Remember:  Business hours are:  Monday to Thursday 8:00 AM to 4:00 PM Provider's Schedule: Delano Metz, MD - Appointments are:  Medication management: Monday and Wednesday 8:00 AM  to 4:00 PM Procedure day: Tuesday and Thursday 7:30 AM to 4:00 PM Edward Jolly, MD - Appointments are:  Medication management: Tuesday and Thursday 8:00 AM to 4:00 PM Procedure day: Monday and Wednesday 7:30 AM to 4:00 PM (Last update: 06/23/2022) ______________________________________________________________________      ______________________________________________________________________    Drug Holidays  What is a "Drug Holiday"? Drug Holiday: is the name given to the process of slowly tapering down and temporarily stopping the pain medication for the purpose of decreasing or eliminating tolerance to the drug.  Benefits Improved effectiveness Decreased required effective dose Improved pain control End dependence on high dose therapy Decrease cost of therapy Uncovering "opioid-induced hyperalgesia". (OIH)  What is "opioid hyperalgesia"? It is a paradoxical increase in pain caused by exposure to opioids. Stopping the opioid pain medication, contrary to the expected, it actually decreases or completely eliminates the pain. Ref.: "A comprehensive review of opioid-induced hyperalgesia". Donney Rankins, et.al. Pain Physician. 2011 Mar-Apr;14(2):145-61.  What is tolerance? Tolerance: the progressive loss of effectiveness of a pain medicine due to repetitive use. A common problem of opioid pain medications.  How long should a "Drug Holiday" last? Effectiveness depends on the patient staying off all opioid pain medicines for a minimum of 14 consecutive days. (2 weeks) During this time the patient should not be taking any other opioid analgesic medication.  How about just taking less of the medicine? Does not work. Will not accomplish goal of eliminating the excess receptors.  How about switching to a different pain medicine? (AKA. "Opioid rotation") Does not work. Creates the illusion of effectiveness by taking advantage of inaccurate equivalent dose calculations between different  opioids. -This "technique" was promoted by studies funded by [employer], such as [employer], creators of "OxyContin".  Can I stop the medicine "cold [place]"? We do not recommend it. You should always coordinate with your prescribing physician to make the transition as smoothly as possible. Avoid stopping the medicine abruptly without consulting. We recommend a "slow taper".  What is a slow taper? Taper: refers to the gradual decrease in dose.   How do I stop/taper the dose? Slowly. Decrease the daily amount of pills that you take by one (1) pill every seven (7) days. This is called a "slow downward taper". Example: if you normally take four (4) pills per day, drop it to three (3) pills per day for seven (7) days, then to two (2) pills per day for seven (7) days, then to one (1) per day for seven (7) days, and then stop the medicine. The 14 day "Drug Holiday" starts on the first day without medicine.   Will I experience withdrawals? Unlikely with a slow taper.  What triggers withdrawals? Withdrawals are triggered by the sudden/abrupt stop of high dose opioids. Withdrawals can be avoided by slowly decreasing the dose over a prolonged period of time.  What are withdrawals? Symptoms associated with sudden/abrupt reduction/stopping of high-dose, long-term use of pain medication. Withdrawal are seldom seen on low dose therapy, or patients rarely taking opioid medication.  Early Withdrawal Symptoms may include: Agitation Anxiety Muscle aches Increased tearing Insomnia Runny nose Sweating Yawning  Late symptoms may include: Abdominal cramping Diarrhea Dilated pupils Goose bumps Nausea Vomiting  When could I see withdrawals? Onset: 8-24 hours after last use for most opioids. 12-48 hours for long-acting opioids (i.e.: methadone)  How long could they last? Duration: 4-10 days for most opioids. 14-21 days for long-acting opioids (i.e.: methadone)  What will happen  after I complete my "Drug Holiday"? The need and indications for the opioid analgesic will be reviewed before restarting the medication. Dose requirements will likely decrease and the dose will need to be adjusted accordingly.   (Last update: 06/23/2023) ______________________________________________________________________      ______________________________________________________________________     Naloxone Nasal Spray  Why am I receiving this medication? [place] STOP ACT requires that all patients taking high dose opioids or at risk of opioids respiratory depression, be prescribed an opioid reversal agent, such as Naloxone (AKA: Narcan).  What is this medication? NALOXONE (nal OX one) treats opioid overdose, which causes slow or shallow breathing, severe drowsiness, or trouble staying awake. Call emergency services after using this medication. You may need additional treatment. Naloxone works by reversing the effects of opioids. It belongs to a group of medications called opioid blockers.  COMMON BRAND NAME(S): Kloxxado, Narcan  What should I tell my care team before I take this medication? They need to know if you have any of these conditions: Heart disease Substance use disorder An unusual or allergic reaction to naloxone, other medications, foods, dyes, or preservatives Pregnant or trying to get pregnant Breast-feeding  When to use this medication? This medication is to be used for the treatment of respiratory depression (less than 8 breaths per minute) secondary to opioid overdose.   How to use this medication? This medication is for use in the nose. Lay the person on their back. Support their neck with your hand and allow the head to tilt back before giving the medication. The nasal spray should be given into 1 nostril. After giving the medication, move the person onto their side. Do not remove or test the nasal spray until ready to use. Get emergency medical help  right away after giving the first dose of this medication, even if the person wakes up. You should be familiar with how to recognize the signs and symptoms of a narcotic overdose. If more doses are needed, give the additional dose in the other nostril. Talk to your care team about the use of this medication in children. While this medication may be prescribed for children as young as newborns for selected conditions, precautions do apply.  Naloxone Overdosage: If you think you have taken too much of this medicine contact a poison control center or emergency room at once.  NOTE: This medicine is only for you. Do not share this medicine with others.  What if I miss a dose? This does not apply.  What may interact with this medication? This is only used during an emergency. No interactions are expected during emergency use. This list may not describe all possible interactions. Give your health care provider a list  of all the medicines, herbs, non-prescription drugs, or dietary supplements you use. Also tell them if you smoke, drink alcohol, or use illegal drugs. Some items may interact with your medicine.  What should I watch for while using this medication? Keep this medication ready for use in the case of an opioid overdose. Make sure that you have the phone number of your care team and local hospital ready. You may need to have additional doses of this medication. Each nasal spray contains a single dose. Some emergencies may require additional doses. After use, bring the treated person to the nearest hospital or call 911. Make sure the treating care team knows that the person has received a dose of this medication. You will receive additional instructions on what to do during and after use of this medication before an emergency occurs.  What side effects may I notice from receiving this medication? Side effects that you should report to your care team as soon as possible: Allergic reactions--skin  rash, itching, hives, swelling of the face, lips, tongue, or throat Side effects that usually do not require medical attention (report these to your care team if they continue or are bothersome): Constipation Dryness or irritation inside the nose Headache Increase in blood pressure Muscle spasms Stuffy nose Toothache This list may not describe all possible side effects. Call your doctor for medical advice about side effects. You may report side effects to FDA at 1-800-FDA-1088.  Where should I keep my medication? Because this is an emergency medication, you should keep it with you at all times.  Keep out of the reach of children and pets. Store between 20 and 25 degrees C (68 and 77 degrees F). Do not freeze. Throw away any unused medication after the expiration date. Keep in original box until ready to use.  NOTE: This sheet is a summary. It may not cover all possible information. If you have questions about this medicine, talk to your doctor, pharmacist, or health care provider.   2023 [employer] (2021-04-25 00:00:00)  ______________________________________________________________________

## 2023-08-10 NOTE — Progress Notes (Signed)
PROVIDER NOTE: Interpretation of information contained herein should be left to medically-trained personnel. Specific patient instructions are provided elsewhere under "Patient Instructions" section of medical record. This document was created in part using STT-dictation technology, any transcriptional errors that may result from this process are unintentional.  Patient: Kendra Nelson Type: Established DOB: October 26, 1960 MRN: [id number] PCP: Iona Hansen, NP  Service: Procedure DOS: 08/10/2023 Setting: Ambulatory Location: Ambulatory outpatient facility Delivery: Face-to-face Provider: Oswaldo Done, MD Specialty: Interventional Pain Management Specialty designation: 09 Location: Outpatient facility Ref. Prov.: Iona Hansen, NP       Interventional Therapy   Primary Reason for Visit: Interventional Pain Management Treatment. CC: Back Pain (lower)  Procedure:          Type: Management of Intrathecal Drug Delivery System (IDDS) - Reservoir Refill (63875). No rate change.  Indications: 1. Chronic pain syndrome   2. Chronic low back pain (1ry area of Pain) (Bilateral) (R>L) w/ sciatica (Bilateral)   3. Chronic lower extremity pain (2ry area of Pain) (Bilateral) (R>L)   4. 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 Degeneration of intervertebral disc of lumbar region with discogenic back pain and lower extremity pain   6. Lumbar facet syndrome (Bilateral) (R>L)   7. Chronic lumbosacral radiculopathy (L5) (Left)   8. Presence of neurostimulator   9. Presence of intrathecal pump   10. Pharmacologic therapy   11. Long term prescription opiate use   12. Chronic use of opiate for therapeutic purpose   13. Encounter for medication management   14. Encounter for chronic pain management   15. Encounter for adjustment and management of infusion pump    Pain Assessment: Self-Reported Pain Score: 4 /10             Reported level is compatible with  observation.       Discussed the use of AI scribe software for clinical note transcription with the patient, who gave verbal consent to proceed.  History of Present Illness   The patient, born on 1961/04/13, presented for a medication refill. They have been on a regimen of bupivacaine 20 mg/ml and fentanyl 1000 mcg/ml, administered via a 40 ml syringe. The patient did not report any changes or issues with their current medication regimen. They brought their medication bottles for verification and confirmed that their pharmacy information remains the same. The patient's next refill is anticipated in approximately three months.  In addition to their medical concerns, the patient shared personal information about their son's upcoming departure for the [employer], indicating a potential source of stress or emotional upheaval. However, they did not report any physical symptoms or health concerns related to this event.      RTCB: 11/08/2023    Intrathecal Drug Delivery System (IDDS)  Pump Device:  Manufacturer: Medtronic Model: Synchromed II Model No.: [id number] Serial No.: [id number] Delivery Route: Intrathecal Type: Programmable  Volume (mL): 40 mL reservoir Priming Volume: n/a  Calibration Constant: 120.0  MRI compatibility: Conditional   Implant Details:  Date: 05/09/2019 Implanter: Odette Fraction, MD  Contact Information: [facility] Last Revision/Replacement: 05/09/2019 Estimated Replacement Date: May/2027  Implant Site: Abdominal Laterality: Left  Catheter: Manufacturer: Medtronic Model:  (two piece) Model No.: [id number]  Serial No.: n/a  Implanted Length (cm): 38.1  Catheter Volume (mL): 0.214  Tip Location (Level): T6-7 Canal Access Site: n/a  Drug content:  Primary Medication Class: Opioid  Medication: PF-Fentanyl  Concentration: 1,000 mcg/mL   Secondary Medication  Class: Local Anesthetic  Medication: PF-Bupivacaine  Concentration: 20.0 mg/mL   Tertiary  Medication Class: none   PA parameters (PCA-mode):  Mode: Off (Inactive)  Programming:  Type: Simple continuous.  Medication, Concentration, Infusion Program, & Delivery Rate: For up-to-date details please see most recent scanned programming printout.   Changes:  Medication Change: None at this point Rate Change: No change in rate  Reported side-effects or adverse reactions: None reported  Effectiveness: Described as relatively effective, allowing for increase in activities of daily living (ADL) Clinically meaningful improvement in function (CMIF): Sustained CMIF goals met  Plan: Pump refill today   Pharmacotherapy Assessment   Opioid Analgesic: Morphine (MS Contin) 30 mg PO BID (60 mg/day of morphine) (60 MME/day) +  PF-(intrathecal)-Fentanyl (13.4 mcg/hr)  changed from Oxycodone IR 10 mg 4x/day (40 mg/day of oxycodone) (60 MME/day), due to national shortage MME/day: 60 MME/day (oral) + 32.16 MME/day (intrathecal) = 93.16 MME/day (Aprox. 1.16 MME/day/kg).   Monitoring: [place] PMP: PDMP reviewed during this encounter.       Pharmacotherapy: No side-effects or adverse reactions reported. Compliance: No problems identified. Effectiveness: Clinically acceptable. Plan: Refer to "POC". UDS:  Summary  Date Value Ref Range Status  02/17/2023 Note  Final    Comment:    ==================================================================== [employer] Select 13 (MW) ==================================================================== Test                             Result       Flag       Units  Drug Present and Declared for Prescription Verification   Morphine                       >6849        EXPECTED   ng/mg creat   Normorphine                    440          EXPECTED   ng/mg creat    Potential sources of large amounts of morphine in the absence of    codeine include administration of morphine or use of heroin.     Normorphine is an expected metabolite of morphine.     Hydromorphone                  71           EXPECTED   ng/mg creat    Hydromorphone may be present as a metabolite of morphine;    concentrations of hydromorphone rarely exceed 5% of the morphine    concentration when this is the source of hydromorphone.    Fentanyl                       27           EXPECTED   ng/mg creat   Norfentanyl                    95           EXPECTED   ng/mg creat    Source of fentanyl is a scheduled prescription medication, including    IV, patch, and transmucosal formulations. Norfentanyl is an expected    metabolite of fentanyl.  ==================================================================== Test                      Result    Flag  Units      Ref Range   Creatinine              146              mg/dL      >=78 ==================================================================== Declared Medications:  The flagging and interpretation on this report are based on the  following declared medications.  Unexpected results may arise from  inaccuracies in the declared medications.   **Note: The testing scope of this panel includes these medications:   Fentanyl  Morphine (MS Contin)   **Note: The testing scope of this panel does not include the  following reported medications:   Atorvastatin (Lipitor)  Bupivacaine  Carvedilol (Coreg)  Furosemide (Lasix)  Lisinopril (Zestril)  Naloxone (Narcan)  Vitamin D2 ==================================================================== For clinical consultation, please call [phone]. ====================================================================    No results found for: "CBDTHCR", "D8THCCBX", "D9THCCBX"   H&P (Pre-op Assessment):  Ms. Turkovich is a 62 y.o. (year old), female patient, seen today for interventional treatment. 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 Ms. Reynold has a current medication list which includes the following prescription(s): AMBULATORY NON FORMULARY MEDICATION, atorvastatin, carvedilol, furosemide, lisinopril, morphine, [START ON 09/09/2023] morphine, [START ON 10/09/2023] morphine, and naloxone. Her primarily concern today is the Back Pain (lower)  Initial Vital Signs:  Pulse/HCG Rate: 63  Temp: (!) 97.5 F (36.4 C) Resp: 16 BP: (!) 146/74 SpO2: 100 %  BMI: Estimated body mass index is 30.11 kg/m as calculated from the following:   Height as of this encounter: 5\' 3"  (1.6 m).   Weight as of this encounter: 170 lb (77.1 kg).  Risk Assessment: Allergies: Reviewed. She is allergic to benadryl [diphenhydramine hcl], bee venom, and dexamethasone.  Allergy Precautions: None required Coagulopathies: Reviewed. None identified.  Blood-thinner therapy: None at this time Active Infection(s): Reviewed. None identified. Ms. Kaldor is afebrile  Site Confirmation: Ms. Peschke was asked to confirm the procedure and laterality before marking the site Procedure checklist: Completed Consent: Before the procedure and under the influence of no sedative(s), amnesic(s), or anxiolytics, the patient was informed of the treatment options, risks and possible complications. To fulfill our ethical and legal obligations, as recommended by the American Medical Association's Code of Ethics, I have informed the patient of my clinical impression; the nature and purpose of the treatment or procedure; the risks, benefits, and possible complications of the intervention; the alternatives, including doing nothing; the risk(s) and benefit(s) of the alternative treatment(s) or procedure(s); and the  risk(s) and benefit(s) of doing nothing.  Ms. Mayweather was provided with information about the general risks and possible complications associated with most interventional procedures. These include, but are not limited to: failure to achieve desired goals, infection, bleeding, organ or nerve damage, allergic reactions, paralysis, and/or death.  In addition, she was informed of those risks and possible complications associated to this particular procedure, which include, but are not limited to: damage to the implant; failure to decrease pain; local, systemic, or serious CNS infections, intraspinal abscess with possible cord compression and paralysis, or life-threatening such as meningitis; bleeding; organ damage; nerve injury or damage with subsequent sensory, motor, and/or autonomic system dysfunction, resulting in transient or permanent pain, numbness, and/or weakness of one  or several areas of the body; allergic reactions, either minor or major life-threatening, such as anaphylactic or anaphylactoid reactions.  Furthermore, Ms. Rase was informed of those risks and complications associated with the medications. These include, but are not limited to: allergic reactions (i.e.: anaphylactic or anaphylactoid reactions); endorphine suppression; bradycardia and/or hypotension; water retention and/or peripheral vascular relaxation leading to lower extremity edema and possible stasis ulcers; respiratory depression and/or shortness of breath; decreased metabolic rate leading to weight gain; swelling or edema; medication-induced neural toxicity; particulate matter embolism and blood vessel occlusion with resultant organ, and/or nervous system infarction; and/or intrathecal granuloma formation with possible spinal cord compression and permanent paralysis.  Before refilling the pump Ms. Yarn was informed that some of the medications used in the devise may not be FDA approved for such use and therefore it  constitutes an off-label use of the medications.  Finally, she was informed that Medicine is not an exact science; therefore, there is also the possibility of unforeseen or unpredictable risks and/or possible complications that may result in a catastrophic outcome. The patient indicated having understood very clearly. We have given the patient no guarantees and we have made no promises. Enough time was given to the patient to ask questions, all of which were answered to the patient's satisfaction. Ms. Skerritt has indicated that she wanted to continue with the procedure. Attestation: I, the ordering provider, attest that I have discussed with the patient the benefits, risks, side-effects, alternatives, likelihood of achieving goals, and potential problems during recovery for the procedure that I have provided informed consent. Date  Time: 08/10/2023 12:43 PM  Pre-Procedure Preparation:  Monitoring: As per clinic protocol. Respiration, ETCO2, SpO2, BP, heart rate and rhythm monitor placed and checked for adequate function Safety Precautions: Patient was assessed for positional comfort and pressure points before starting the procedure. Time-out: I initiated and conducted the "Time-out" before starting the procedure, as per protocol. The patient was asked to participate by confirming the accuracy of the "Time Out" information. Verification of the correct person, site, and procedure were performed and confirmed by me, the nursing staff, and the patient. "Time-out" conducted as per Joint Commission's Universal Protocol (UP.01.01.01). Time: 1304 Start Time: 1300 hrs.  Description of Procedure:          Position: Supine Target Area: Central-port of intrathecal pump. Approach: Anterior, 90 degree angle approach. Area Prepped: Entire Area around the pump implant. ChloraPrep (2% chlorhexidine gluconate and 70% isopropyl alcohol) Safety Precautions: Aspiration looking for blood return was conducted prior  to all injections. At no point did we inject any substances, as a needle was being advanced. No attempts were made at seeking any paresthesias. Safe injection practices and needle disposal techniques used. Medications properly checked for expiration dates. SDV (single dose vial) medications used. Description of the Procedure: Protocol guidelines were followed. Two nurses trained to do implant refills were present during the entire procedure. The refill medication was checked by both healthcare providers as well as the patient. The patient was included in the "Time-out" to verify the medication. The patient was placed in position. The pump was identified. The area was prepped in the usual manner. The sterile template was positioned over the pump, making sure the side-port location matched that of the pump. Both, the pump and the template were held for stability. The needle provided in the Medtronic Kit was then introduced thru the center of the template and into the central port. The pump content was aspirated and discarded volume documented.  The new medication was slowly infused into the pump, thru the filter, making sure to avoid overpressure of the device. The needle was then removed and the area cleansed, making sure to leave some of the prepping solution back to take advantage of its long term bactericidal properties. The pump was interrogated and programmed to reflect the correct medication, volume, and dosage. The program was printed and taken to the physician for approval. Once checked and signed by the physician, a copy was provided to the patient and another scanned into the EMR.  Vitals:   08/10/23 1241  BP: (!) 146/74  Pulse: 63  Resp: 16  Temp: (!) 97.5 F (36.4 C)  TempSrc: Temporal  SpO2: 100%  Weight: 170 lb (77.1 kg)  Height: 5\' 3"  (1.6 m)    Start Time: 1300 hrs. End Time: 1110 hrs. Materials & Medications: Medtronic Refill Kit Medication(s): Please see chart orders for  details.  Type of Imaging Technique: None used Indication(s): N/A Exposure Time: No patient exposure Contrast: None used. Fluoroscopic Guidance: N/A Ultrasound Guidance: N/A Interpretation: N/A  Antibiotic Prophylaxis:   Anti-infectives (From admission, onward)    None      Indication(s): None identified  Post-operative Assessment:  Post-procedure Vital Signs:  Pulse/HCG Rate: 63  Temp: (!) 97.5 F (36.4 C) Resp: 16 BP: (!) 146/74 SpO2: 100 %  EBL: None  Complications: No immediate post-treatment complications observed by team, or reported by patient.  Note: The patient tolerated the entire procedure well. A repeat set of vitals were taken after the procedure and the patient was kept under observation following institutional policy, for this type of procedure. Post-procedural neurological assessment was performed, showing return to baseline, prior to discharge. The patient was provided with post-procedure discharge instructions, including a section on how to identify potential problems. Should any problems arise concerning this procedure, the patient was given instructions to immediately contact us, at any time, without hesitation. In any case, we plan to contact the patient by telephone for a follow-up status report regarding this interventional procedure.  Comments:  No additional relevant information.  Plan of Care (POC)  Assessment and Plan    Chronic Pain Management They are well-managed on their current regimen of chronic pain management, which includes bupivacaine 20 mg/mL and fentanyl 1000 mcg/mL via a 40 mL syringe. Informed consent for the refill was obtained, during which the risks of local anesthetic systemic toxicity and opioid-related adverse effects were discussed. The benefits, such as continued pain relief and improved quality of life, were also highlighted. They prefer to continue with the current regimen. We will refill bupivacaine 20 mg/mL and fentanyl  1000 mcg/mL in a 40 mL syringe and schedule the next refill appointment in three months.  Medication Management They requested refills for their current medications, with pharmacy details remaining unchanged. Informed consent for the medication refills was obtained, with an emphasis on adherence for optimal management. We will send the medication refills to the pharmacy.     Orders:  Orders Placed This Encounter  Procedures   PUMP REFILL    Maintain Protocol by having two(2) healthcare providers during procedure and programming.    Scheduling Instructions:     Please refill intrathecal pump today.    Order Specific Question:   Where will this procedure be performed?    Answer:   [facility]   PUMP REFILL    Whenever possible schedule on a procedure today.    Standing Status:   Future  Standing Expiration Date:   12/09/2023    Scheduling Instructions:     Please schedule intrathecal pump refill based on pump programming. Avoid schedule intervals of more than 120 days (4 months).    Order Specific Question:   Where will this procedure be performed?    Answer:   [facility]   Informed Consent Details: Physician/Practitioner Attestation; Transcribe to consent form and obtain patient signature    Transcribe to consent form and obtain patient signature.    Order Specific Question:   Physician/Practitioner attestation of informed consent for procedure/surgical case    Answer:   I, the physician/practitioner, attest that I have discussed with the patient the benefits, risks, side effects, alternatives, likelihood of achieving goals and potential problems during recovery for the procedure that I have provided informed consent.    Order Specific Question:   Procedure    Answer:   Intrathecal pump refill    Order Specific Question:   Physician/Practitioner performing the procedure    Answer:   Attending Physician: Sydnee Levans. Laban Emperor, MD & designated trained staff    Order  Specific Question:   Indication/Reason    Answer:   Chronic Pain Syndrome (G89.4), presence of an intrathecal pump (Z97.8)   Chronic Opioid Analgesic:  Morphine (MS Contin) 30 mg PO BID (60 mg/day of morphine) (60 MME/day) +  PF-(intrathecal)-Fentanyl (13.4 mcg/hr)  changed from Oxycodone IR 10 mg 4x/day (40 mg/day of oxycodone) (60 MME/day), due to national shortage MME/day: 60 MME/day (oral) + 32.16 MME/day (intrathecal) = 93.16 MME/day (Aprox. 1.16 MME/day/kg).   Medications ordered for procedure: Meds ordered this encounter  Medications   morphine (MS CONTIN) 30 MG 12 hr tablet    Sig: Take 1 tablet (30 mg total) by mouth every 12 (twelve) hours. Must last 30 days. Do not break tablet    Dispense:  60 tablet    Refill:  0    DO NOT: delete (not duplicate); no partial-fill (will deny script to complete), no refill request (F/U required). DISPENSE: 1 day early if closed on fill date. WARN: No CNS-depressants within 8 hrs of med.   naloxone (NARCAN) nasal spray 4 mg/0.1 mL    Sig: Place 1 spray into the nose as needed for up to 365 doses (for opioid-induced respiratory depresssion). In case of emergency (overdose), spray once into each nostril. If no response within 3 minutes, repeat application and call 911.    Dispense:  1 each    Refill:  0    Instruct patient in proper use of device.   morphine (MS CONTIN) 30 MG 12 hr tablet    Sig: Take 1 tablet (30 mg total) by mouth every 12 (twelve) hours. Must last 30 days. Do not break tablet    Dispense:  60 tablet    Refill:  0    DO NOT: delete (not duplicate); no partial-fill (will deny script to complete), no refill request (F/U required). DISPENSE: 1 day early if closed on fill date. WARN: No CNS-depressants within 8 hrs of med.   morphine (MS CONTIN) 30 MG 12 hr tablet    Sig: Take 1 tablet (30 mg total) by mouth every 12 (twelve) hours. Must last 30 days. Do not break tablet    Dispense:  60 tablet    Refill:  0    DO NOT: delete (not  duplicate); no partial-fill (will deny script to complete), no refill request (F/U required). DISPENSE: 1 day early if closed on fill date. WARN:  No CNS-depressants within 8 hrs of med.   Medications administered: Steward Drone A. Idleman had no medications administered during this visit.  See the medical record for exact dosing, route, and time of administration.  Follow-up plan:   Return for Pump Refill (Max:44mo).       Interventional Therapies  Risk Factors  Considerations:   ELIQUIS Anticoagulation (Stop: 3 days  Restart: 6 hours) Severe anxiety and agoraphobia  Poor candidate for RFA secondary to Lumbar hardware   Planned  Pending:   Intrathecal pump refill as per pump programming.   Under consideration:   Diagnostic left IA knee joint inj  Diagnostic bilateral L2 TFESI  Diagnostic left genicular NB  Possible left genicular nerve RFA  Diagnostic caudal ESI + epidurogram  Possible Racz procedure    Completed:   Palliative intrathecal pump refills and adjustments  Intrathecal pump insertion by Dr. Maeola Harman [facility]) (05/10/2012)  Intrathecal pump replacement by Dr. Odette Fraction [facility]) (05/09/2019)  Spinal cord stimulator replacement by Dr. Maeola Harman [facility]) (06/09/2016)    Therapeutic  Palliative (PRN) options:   Therapeutic/palliative intrathecal pump  management (analysis and refill)       Recent Visits No visits were found meeting these conditions. Showing recent visits within past 90 days and meeting all other requirements Today's Visits Date Type Provider Dept  08/10/23 Office Visit Delano Metz, MD [facility]  Showing today's visits and meeting all other requirements Future Appointments Date Type Provider Dept  11/02/23 Appointment Delano Metz, MD [facility]  Showing future appointments within next 90 days and meeting all other requirements  Disposition: Discharge home   Discharge (Date  Time): 08/10/2023; 1330 hrs.   Primary Care Physician: Iona Hansen, NP Location: [facility] Note by: Oswaldo Done, MD (TTS technology used. I apologize for any typographical errors that were not detected and corrected.) Date: 08/10/2023; Time: 1:22 PM  Disclaimer:  Medicine is not an Visual merchandiser. The only guarantee in medicine is that nothing is guaranteed. It is important to note that the decision to proceed with this intervention was based on the information collected from the patient. The Data and conclusions were drawn from the patient's questionnaire, the interview, and the physical examination. Because the information was provided in large part by the patient, it cannot be guaranteed that it has not been purposely or unconsciously manipulated. Every effort has been made to obtain as much relevant data as possible for this evaluation. It is important to note that the conclusions that lead to this procedure are derived in large part from the available data. Always take into account that the treatment will also be dependent on availability of resources and existing treatment guidelines, considered by other Pain Management Practitioners as being common knowledge and practice, at the time of the intervention. For Medico-Legal purposes, it is also important to point out that variation in procedural techniques and pharmacological choices are the acceptable norm. The indications, contraindications, technique, and results of the above procedure should only be interpreted and judged by a Board-Certified Interventional Pain Specialist with extensive familiarity and expertise in the same exact procedure and technique.

## 2023-08-11 ENCOUNTER — Other Ambulatory Visit: Payer: Self-pay | Admitting: Cardiology

## 2023-08-11 ENCOUNTER — Telehealth: Payer: Self-pay | Admitting: *Deleted

## 2023-08-11 NOTE — Telephone Encounter (Signed)
Spoke with patient's husband, no problems post IT pump refill.

## 2023-08-17 ENCOUNTER — Encounter: Payer: Medicare HMO | Admitting: Pain Medicine

## 2023-08-20 ENCOUNTER — Other Ambulatory Visit: Payer: Self-pay | Admitting: Cardiology

## 2023-08-21 ENCOUNTER — Other Ambulatory Visit: Payer: Self-pay | Admitting: Internal Medicine

## 2023-08-24 DIAGNOSIS — J329 Chronic sinusitis, unspecified: Secondary | ICD-10-CM | POA: Insufficient documentation

## 2023-09-06 MED FILL — Medication: INTRATHECAL | Qty: 1 | Status: AC

## 2023-10-11 ENCOUNTER — Other Ambulatory Visit: Payer: Self-pay

## 2023-10-11 MED ORDER — PAIN MANAGEMENT IT PUMP REFILL: 1.0000 | Freq: Once | INTRATHECAL | 0 refills | Status: AC

## 2023-11-01 NOTE — Progress Notes (Unsigned)
 PROVIDER NOTE: Interpretation of information contained herein should be left to medically-trained personnel. Specific patient instructions are provided elsewhere under "Patient Instructions" section of medical record. This document was created in part using STT-dictation technology, any transcriptional errors that may result from this process are unintentional.  Patient: Kendra Nelson Type: Established DOB: 1961/02/10 MRN: [id number] PCP: Iona Hansen, NP  Service: Procedure DOS: 11/02/2023 Setting: Ambulatory Location: Ambulatory outpatient facility Delivery: Face-to-face Provider: Oswaldo Done, MD Specialty: Interventional Pain Management Specialty designation: 09 Location: Outpatient facility Ref. Prov.: Iona Hansen, NP       Interventional Therapy   Primary Reason for Visit: Interventional Pain Management Treatment. CC: No chief complaint on file.  Procedure:          Type: Management of Intrathecal Drug Delivery System (IDDS) - Reservoir Refill (78295). No rate change.  Indications: 1. Chronic pain syndrome   2. Chronic low back pain (1ry area of Pain) (Bilateral) (R>L) w/ sciatica (Bilateral)   3. Chronic lower extremity pain (2ry area of Pain) (Bilateral) (R>L)   4. 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 Degeneration of intervertebral disc of lumbar region with discogenic back pain and lower extremity pain   6. Lumbar facet syndrome (Bilateral) (R>L)   7. Chronic lumbosacral radiculopathy (L5) (Left)   8. Presence of neurostimulator   9. Presence of intrathecal pump   10. Pharmacologic therapy   11. Long term prescription opiate use   12. Encounter for medication management   13. Encounter for adjustment and management of infusion pump    Pain Assessment: Self-Reported Pain Score:  /10             Reported level is compatible with observation.          Intrathecal Drug Delivery System (IDDS)  Pump Device:  Manufacturer:  Medtronic Model: Synchromed II Model No.: [id number] Serial No.: [id number] Delivery Route: Intrathecal Type: Programmable  Volume (mL): 40 mL reservoir Priming Volume: n/a  Calibration Constant: 120.0  MRI compatibility: Conditional   Implant Details:  Date: 05/09/2019 Implanter: Odette Fraction, MD  Contact Information: [facility] Last Revision/Replacement: 05/09/2019 Estimated Replacement Date: May/2027  Implant Site: Abdominal Laterality: Left  Catheter: Manufacturer: Medtronic Model:  (two piece) Model No.: [id number]  Serial No.: n/a  Implanted Length (cm): 38.1  Catheter Volume (mL): 0.214  Tip Location (Level): T6-7 Canal Access Site: n/a  Drug content:  Primary Medication Class: Opioid  Medication: PF-Fentanyl  Concentration: 1,000 mcg/mL   Secondary Medication Class: Local Anesthetic  Medication: PF-Bupivacaine  Concentration: 20.0 mg/mL   Tertiary Medication Class: none   PA parameters (PCA-mode):  Mode: Off (Inactive)  Programming:  Type: Simple continuous.  Medication, Concentration, Infusion Program, & Delivery Rate: For up-to-date details please see most recent scanned programming printout.   Changes:  Medication Change: None at this point Rate Change: No change in rate  Reported side-effects or adverse reactions: None reported  Effectiveness: Described as relatively effective, allowing for increase in activities of daily living (ADL) Clinically meaningful improvement in function (CMIF): Sustained CMIF goals met  Plan: Pump refill today   Pharmacotherapy Assessment   Opioid Analgesic:  Morphine (MS Contin) 30 mg PO BID (60 mg/day of morphine) (60 MME/day) +  PF-(intrathecal)-Fentanyl (13.4 mcg/hr)  changed from Oxycodone IR 10 mg 4x/day (40 mg/day of oxycodone) (60 MME/day), due to national shortage MME/day: 60 MME/day (oral) + 32.16 MME/day (intrathecal) = 93.16 MME/day (Aprox. 1.16 MME/day/kg).  Monitoring: [place] PMP:  PDMP reviewed during this encounter.       Pharmacotherapy: No side-effects or adverse reactions reported. Compliance: No problems identified. Effectiveness: Clinically acceptable. Plan: Refer to "POC". UDS:  Summary  Date Value Ref Range Status  02/17/2023 Note  Final    Comment:    ==================================================================== [employer] Select 13 (MW) ==================================================================== Test                             Result       Flag       Units  Drug Present and Declared for Prescription Verification   Morphine                       >6849        EXPECTED   ng/mg creat   Normorphine                    440          EXPECTED   ng/mg creat    Potential sources of large amounts of morphine in the absence of    codeine include administration of morphine or use of heroin.     Normorphine is an expected metabolite of morphine.    Hydromorphone                  71           EXPECTED   ng/mg creat    Hydromorphone may be present as a metabolite of morphine;    concentrations of hydromorphone rarely exceed 5% of the morphine    concentration when this is the source of hydromorphone.    Fentanyl                       27           EXPECTED   ng/mg creat   Norfentanyl                    95           EXPECTED   ng/mg creat    Source of fentanyl is a scheduled prescription medication, including    IV, patch, and transmucosal formulations. Norfentanyl is an expected    metabolite of fentanyl.  ==================================================================== Test                      Result    Flag   Units      Ref Range   Creatinine              146              mg/dL      >=16 ==================================================================== Declared Medications:  The flagging and interpretation on this report are based on the  following declared medications.  Unexpected results may arise from  inaccuracies in the declared  medications.   **Note: The testing scope of this panel includes these medications:   Fentanyl  Morphine (MS Contin)   **Note: The testing scope of this panel does not include the  following reported medications:   Atorvastatin (Lipitor)  Bupivacaine  Carvedilol (Coreg)  Furosemide (Lasix)  Lisinopril (Zestril)  Naloxone (Narcan)  Vitamin D2 ==================================================================== For clinical consultation, please call [phone]. ====================================================================    No results found for: "CBDTHCR", "D8THCCBX", "D9THCCBX"   H&P (Pre-op Assessment):  Ms. Duce is a  63 y.o. (year old), female patient, seen today for interventional treatment. 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 Ms. Flanery has a current medication list which includes the following prescription(s): AMBULATORY NON FORMULARY MEDICATION, atorvastatin, carvedilol, furosemide, lisinopril, morphine, morphine, morphine, and naloxone. Her primarily concern today is the No chief complaint on file.  Initial Vital Signs:  Pulse/HCG Rate:    Temp:   Resp:   BP:   SpO2:    BMI: Estimated body mass index is 30.11 kg/m as calculated from the following:   Height as of 08/10/23: 5\' 3"  (1.6 m).   Weight as of 08/10/23: 170 lb (77.1 kg).  Risk Assessment: Allergies: Reviewed. She is allergic to benadryl [diphenhydramine hcl], bee venom, and dexamethasone.  Allergy Precautions: None required Coagulopathies: Reviewed. None identified.  Blood-thinner therapy: None at this  time Active Infection(s): Reviewed. None identified. Ms. Weigand is afebrile  Site Confirmation: Ms. Hartel was asked to confirm the procedure and laterality before marking the site Procedure checklist: Completed Consent: Before the procedure and under the influence of no sedative(s), amnesic(s), or anxiolytics, the patient was informed of the treatment options, risks and possible complications. To fulfill our ethical and legal obligations, as recommended by the American Medical Association's Code of Ethics, I have informed the patient of my clinical impression; the nature and purpose of the treatment or procedure; the risks, benefits, and possible complications of the intervention; the alternatives, including doing nothing; the risk(s) and benefit(s) of the alternative treatment(s) or procedure(s); and the risk(s) and benefit(s) of doing nothing.  Ms. Shafer was provided with information about the general risks and possible complications associated with most interventional procedures. These include, but are not limited to: failure to achieve desired goals, infection, bleeding, organ or nerve damage, allergic reactions, paralysis, and/or death.  In addition, she was informed of those risks and possible complications associated to this particular procedure, which include, but are not limited to: damage to the implant; failure to decrease pain; local, systemic, or serious CNS infections, intraspinal abscess with possible cord compression and paralysis, or life-threatening such as meningitis; bleeding; organ damage; nerve injury or damage with subsequent sensory, motor, and/or autonomic system dysfunction, resulting in transient or permanent pain, numbness, and/or weakness of one or several areas of the body; allergic reactions, either minor or major life-threatening, such as anaphylactic or anaphylactoid reactions.  Furthermore, Ms. Stantz was informed of those risks and complications associated  with the medications. These include, but are not limited to: allergic reactions (i.e.: anaphylactic or anaphylactoid reactions); endorphine suppression; bradycardia and/or hypotension; water retention and/or peripheral vascular relaxation leading to lower extremity edema and possible stasis ulcers; respiratory depression and/or shortness of breath; decreased metabolic rate leading to weight gain; swelling or edema; medication-induced neural toxicity; particulate matter embolism and blood vessel occlusion with resultant organ, and/or nervous system infarction; and/or intrathecal granuloma formation with possible spinal cord compression and permanent paralysis.  Before refilling the pump Ms. Norment was informed that some of the medications used in the devise may not be FDA approved for such use and therefore it constitutes an off-label use of the medications.  Finally, she was informed that Medicine is not an exact science; therefore, there is also the possibility of unforeseen  or unpredictable risks and/or possible complications that may result in a catastrophic outcome. The patient indicated having understood very clearly. We have given the patient no guarantees and we have made no promises. Enough time was given to the patient to ask questions, all of which were answered to the patient's satisfaction. Ms. Langer has indicated that she wanted to continue with the procedure. Attestation: I, the ordering provider, attest that I have discussed with the patient the benefits, risks, side-effects, alternatives, likelihood of achieving goals, and potential problems during recovery for the procedure that I have provided informed consent. Date  Time: {CHL [facility]-PAIN TIME CHOICES:[id number]}  Pre-Procedure Preparation:  Monitoring: As per clinic protocol. Respiration, ETCO2, SpO2, BP, heart rate and rhythm monitor placed and checked for adequate function Safety Precautions: Patient was assessed for positional  comfort and pressure points before starting the procedure. Time-out: I initiated and conducted the "Time-out" before starting the procedure, as per protocol. The patient was asked to participate by confirming the accuracy of the "Time Out" information. Verification of the correct person, site, and procedure were performed and confirmed by me, the nursing staff, and the patient. "Time-out" conducted as per Joint Commission's Universal Protocol (UP.01.01.01). Time:   Start Time:   hrs.  Description of Procedure:          Position: Supine Target Area: Central-port of intrathecal pump. Approach: Anterior, 90 degree angle approach. Area Prepped: Entire Area around the pump implant. ChloraPrep (2% chlorhexidine gluconate and 70% isopropyl alcohol) Safety Precautions: Aspiration looking for blood return was conducted prior to all injections. At no point did we inject any substances, as a needle was being advanced. No attempts were made at seeking any paresthesias. Safe injection practices and needle disposal techniques used. Medications properly checked for expiration dates. SDV (single dose vial) medications used. Description of the Procedure: Protocol guidelines were followed. Two nurses trained to do implant refills were present during the entire procedure. The refill medication was checked by both healthcare providers as well as the patient. The patient was included in the "Time-out" to verify the medication. The patient was placed in position. The pump was identified. The area was prepped in the usual manner. The sterile template was positioned over the pump, making sure the side-port location matched that of the pump. Both, the pump and the template were held for stability. The needle provided in the Medtronic Kit was then introduced thru the center of the template and into the central port. The pump content was aspirated and discarded volume documented. The new medication was slowly infused into the  pump, thru the filter, making sure to avoid overpressure of the device. The needle was then removed and the area cleansed, making sure to leave some of the prepping solution back to take advantage of its long term bactericidal properties. The pump was interrogated and programmed to reflect the correct medication, volume, and dosage. The program was printed and taken to the physician for approval. Once checked and signed by the physician, a copy was provided to the patient and another scanned into the EMR.  There were no vitals filed for this visit.  Start Time:   hrs. End Time:   hrs. Materials & Medications: Medtronic Refill Kit Medication(s): Please see chart orders for details.  Type of Imaging Technique: None used Indication(s): N/A Exposure Time: No patient exposure Contrast: None used. Fluoroscopic Guidance: N/A Ultrasound Guidance: N/A Interpretation: N/A  Antibiotic Prophylaxis:   Anti-infectives (From admission, onward)    None  Indication(s): None identified  Post-operative Assessment:  Post-procedure Vital Signs:  Pulse/HCG Rate:    Temp:   Resp:   BP:   SpO2:    EBL: None  Complications: No immediate post-treatment complications observed by team, or reported by patient.  Note: The patient tolerated the entire procedure well. A repeat set of vitals were taken after the procedure and the patient was kept under observation following institutional policy, for this type of procedure. Post-procedural neurological assessment was performed, showing return to baseline, prior to discharge. The patient was provided with post-procedure discharge instructions, including a section on how to identify potential problems. Should any problems arise concerning this procedure, the patient was given instructions to immediately contact us, at any time, without hesitation. In any case, we plan to contact the patient by telephone for a follow-up status report regarding this  interventional procedure.  Comments:  No additional relevant information.  Plan of Care (POC)  Orders:  No orders of the defined types were placed in this encounter.  Chronic Opioid Analgesic:  Morphine (MS Contin) 30 mg PO BID (60 mg/day of morphine) (60 MME/day) +  PF-(intrathecal)-Fentanyl (13.4 mcg/hr)  changed from Oxycodone IR 10 mg 4x/day (40 mg/day of oxycodone) (60 MME/day), due to national shortage MME/day: 60 MME/day (oral) + 32.16 MME/day (intrathecal) = 93.16 MME/day (Aprox. 1.16 MME/day/kg).   Medications ordered for procedure: No orders of the defined types were placed in this encounter.  Medications administered: Steward Drone A. Domanski had no medications administered during this visit.  See the medical record for exact dosing, route, and time of administration.  Follow-up plan:   No follow-ups on file.       Interventional Therapies  Risk Factors  Considerations:   ELIQUIS Anticoagulation (Stop: 3 days  Restart: 6 hours) Severe anxiety and agoraphobia  Poor candidate for RFA secondary to Lumbar hardware   Planned  Pending:   Intrathecal pump refill as per pump programming.   Under consideration:   Diagnostic left IA knee joint inj  Diagnostic bilateral L2 TFESI  Diagnostic left genicular NB  Possible left genicular nerve RFA  Diagnostic caudal ESI + epidurogram  Possible Racz procedure    Completed:   Palliative intrathecal pump refills and adjustments  Intrathecal pump insertion by Dr. Maeola Harman [facility]) (05/10/2012)  Intrathecal pump replacement by Dr. Odette Fraction [facility]) (05/09/2019)  Spinal cord stimulator replacement by Dr. Maeola Harman [facility]) (06/09/2016)    Therapeutic  Palliative (PRN) options:   Therapeutic/palliative intrathecal pump  management (analysis and refill)      Recent Visits Date Type Provider Dept  08/10/23 Office Visit Delano Metz, MD [facility]  Showing  recent visits within past 90 days and meeting all other requirements Future Appointments Date Type Provider Dept  11/02/23 Appointment Delano Metz, MD [facility]  Showing future appointments within next 90 days and meeting all other requirements  Disposition: Discharge home  Discharge (Date  Time): 11/02/2023;   hrs.   Primary Care Physician: Iona Hansen, NP Location: [facility] Note by: Oswaldo Done, MD (TTS technology used. I apologize for any typographical errors that were not detected and corrected.) Date: 11/02/2023; Time: 7:33 AM  Disclaimer:  Medicine is not an Visual merchandiser. The only guarantee in medicine is that nothing is guaranteed. It is important to note that the decision to proceed with this intervention was based on the information collected from the patient. The Data and conclusions were drawn from  the patient's questionnaire, the interview, and the physical examination. Because the information was provided in large part by the patient, it cannot be guaranteed that it has not been purposely or unconsciously manipulated. Every effort has been made to obtain as much relevant data as possible for this evaluation. It is important to note that the conclusions that lead to this procedure are derived in large part from the available data. Always take into account that the treatment will also be dependent on availability of resources and existing treatment guidelines, considered by other Pain Management Practitioners as being common knowledge and practice, at the time of the intervention. For Medico-Legal purposes, it is also important to point out that variation in procedural techniques and pharmacological choices are the acceptable norm. The indications, contraindications, technique, and results of the above procedure should only be interpreted and judged by a Board-Certified Interventional Pain Specialist with extensive familiarity and  expertise in the same exact procedure and technique.

## 2023-11-02 ENCOUNTER — Ambulatory Visit: Payer: Medicare HMO | Attending: Pain Medicine | Admitting: Pain Medicine

## 2023-11-02 ENCOUNTER — Other Ambulatory Visit: Payer: Self-pay

## 2023-11-02 VITALS — BP 157/85 | HR 60 | Temp 97.4°F | Resp 18 | Ht 63.0 in | Wt 170.0 lb

## 2023-11-02 DIAGNOSIS — G8929 Other chronic pain: Secondary | ICD-10-CM | POA: Insufficient documentation

## 2023-11-02 DIAGNOSIS — M51362 Other intervertebral disc degeneration, lumbar region with discogenic back pain and lower extremity pain: Secondary | ICD-10-CM | POA: Insufficient documentation

## 2023-11-02 DIAGNOSIS — Z79891 Long term (current) use of opiate analgesic: Secondary | ICD-10-CM | POA: Insufficient documentation

## 2023-11-02 DIAGNOSIS — M5441 Lumbago with sciatica, right side: Secondary | ICD-10-CM | POA: Insufficient documentation

## 2023-11-02 DIAGNOSIS — Z9682 Presence of neurostimulator: Secondary | ICD-10-CM | POA: Insufficient documentation

## 2023-11-02 DIAGNOSIS — M5442 Lumbago with sciatica, left side: Secondary | ICD-10-CM | POA: Diagnosis present

## 2023-11-02 DIAGNOSIS — M5417 Radiculopathy, lumbosacral region: Secondary | ICD-10-CM | POA: Insufficient documentation

## 2023-11-02 DIAGNOSIS — G894 Chronic pain syndrome: Secondary | ICD-10-CM | POA: Insufficient documentation

## 2023-11-02 DIAGNOSIS — M79604 Pain in right leg: Secondary | ICD-10-CM | POA: Insufficient documentation

## 2023-11-02 DIAGNOSIS — M47816 Spondylosis without myelopathy or radiculopathy, lumbar region: Secondary | ICD-10-CM | POA: Diagnosis present

## 2023-11-02 DIAGNOSIS — Z79899 Other long term (current) drug therapy: Secondary | ICD-10-CM | POA: Insufficient documentation

## 2023-11-02 DIAGNOSIS — M79605 Pain in left leg: Secondary | ICD-10-CM | POA: Insufficient documentation

## 2023-11-02 DIAGNOSIS — Z978 Presence of other specified devices: Secondary | ICD-10-CM | POA: Diagnosis present

## 2023-11-02 DIAGNOSIS — M961 Postlaminectomy syndrome, not elsewhere classified: Secondary | ICD-10-CM | POA: Insufficient documentation

## 2023-11-02 DIAGNOSIS — Z451 Encounter for adjustment and management of infusion pump: Secondary | ICD-10-CM | POA: Insufficient documentation

## 2023-11-02 MED ORDER — PAIN MANAGEMENT IT PUMP REFILL: 1.0000 | Freq: Once | INTRATHECAL | 0 refills | Status: AC

## 2023-11-02 MED ORDER — MORPHINE SULFATE ER 30 MG PO TBCR: 30.0000 mg | EXTENDED_RELEASE_TABLET | Freq: Two times a day (BID) | ORAL | 0 refills | Status: DC

## 2023-11-02 MED ORDER — MORPHINE SULFATE ER 30 MG PO TBCR
30.0000 mg | EXTENDED_RELEASE_TABLET | Freq: Two times a day (BID) | ORAL | 0 refills | Status: DC
Start: 2023-11-08 — End: 2023-11-08

## 2023-11-02 NOTE — Progress Notes (Signed)
 Nursing Pain Medication Assessment:  Safety precautions to be maintained throughout the outpatient stay will include: orient to surroundings, keep bed in low position, maintain call bell within reach at all times, provide assistance with transfer out of bed and ambulation.  Medication Inspection Compliance: Pill count conducted under aseptic conditions, in front of the patient. Neither the pills nor the bottle was removed from the patient's sight at any time. Once count was completed pills were immediately returned to the patient in their original bottle.  Medication: Morphine IR Pill/Patch Count:  11 of 60 pills remain Pill/Patch Appearance: Markings consistent with prescribed medication Bottle Appearance: Standard pharmacy container. Clearly labeled. Filled Date: 2 / 8 / 2025 Last Medication intake:  Today

## 2023-11-02 NOTE — Patient Instructions (Addendum)
 Fill date 3-10 4-9-  5-9 ______________________________________________________________________    Opioid Pain Medication Update  To: All patients taking opioid pain medications. (I.e.: hydrocodone, hydromorphone, oxycodone, oxymorphone, morphine, codeine, methadone, tapentadol, tramadol, buprenorphine, fentanyl, etc.)  Re: Updated review of side effects and adverse reactions of opioid analgesics, as well as new information about long term effects of this class of medications.  Direct risks of long-term opioid therapy are not limited to opioid addiction and overdose. Potential medical risks include serious fractures, breathing problems during sleep, hyperalgesia, immunosuppression, chronic constipation, bowel obstruction, myocardial infarction, and tooth decay secondary to xerostomia.  Unpredictable adverse effects that can occur even if you take your medication correctly: Cognitive impairment, respiratory depression, and death. Most people think that if they take their medication "correctly", and "as instructed", that they will be safe. Nothing could be farther from the truth. In reality, a significant amount of recorded deaths associated with the use of opioids has occurred in individuals that had taken the medication for a long time, and were taking their medication correctly. The following are examples of how this can happen: Patient taking his/her medication for a long time, as instructed, without any side effects, is given a certain antibiotic or another unrelated medication, which in turn triggers a "Drug-to-drug interaction" leading to disorientation, cognitive impairment, impaired reflexes, respiratory depression or an untoward event leading to serious bodily harm or injury, including death.  Patient taking his/her medication for a long time, as instructed, without any side effects, develops an acute impairment of liver and/or kidney function. This will lead to a rapid inability of the body  to breakdown and eliminate their pain medication, which will result in effects similar to an "overdose", but with the same medicine and dose that they had always taken. This again may lead to disorientation, cognitive impairment, impaired reflexes, respiratory depression or an untoward event leading to serious bodily harm or injury, including death.  A similar problem will occur with patients as they grow older and their liver and kidney function begins to decrease as part of the aging process.  Background information: Historically, the original case for using long-term opioid therapy to treat chronic noncancer pain was based on safety assumptions that subsequent experience has called into question. In 1996, the American Pain Society and the American Academy of Pain Medicine issued a consensus statement supporting long-term opioid therapy. This statement acknowledged the dangers of opioid prescribing but concluded that the risk for addiction was low; respiratory depression induced by opioids was short-lived, occurred mainly in opioid-naive patients, and was antagonized by pain; tolerance was not a common problem; and efforts to control diversion should not constrain opioid prescribing. This has now proven to be wrong. Experience regarding the risks for opioid addiction, misuse, and overdose in community practice has failed to support these assumptions.  According to the Centers for Disease Control and Prevention, fatal overdoses involving opioid analgesics have increased sharply over the past decade. Currently, more than 96,700 people die from drug overdoses every year. Opioids are a factor in 7 out of every 10 overdose deaths. Deaths from drug overdose have surpassed motor vehicle accidents as the leading cause of death for individuals between the ages of 52 and 21.  Clinical data suggest that neuroendocrine dysfunction may be very common in both men and women, potentially causing hypogonadism, erectile  dysfunction, infertility, decreased libido, osteoporosis, and depression. Recent studies linked higher opioid dose to increased opioid-related mortality. Controlled observational studies reported that long-term opioid therapy may be associated with  increased risk for cardiovascular events. Subsequent meta-analysis concluded that the safety of long-term opioid therapy in elderly patients has not been proven.   Side Effects and adverse reactions: Common side effects: Drowsiness (sedation). Dizziness. Nausea and vomiting. Constipation. Physical dependence -- Dependence often manifests with withdrawal symptoms when opioids are discontinued or decreased. Tolerance -- As you take repeated doses of opioids, you require increased medication to experience the same effect of pain relief. Respiratory depression -- This can occur in healthy people, especially with higher doses. However, people with COPD, asthma or other lung conditions may be even more susceptible to fatal respiratory impairment.  Uncommon side effects: An increased sensitivity to feeling pain and extreme response to pain (hyperalgesia). Chronic use of opioids can lead to this. Delayed gastric emptying (the process by which the contents of your stomach are moved into your small intestine). Muscle rigidity. Immune system and hormonal dysfunction. Quick, involuntary muscle jerks (myoclonus). Arrhythmia. Itchy skin (pruritus). Dry mouth (xerostomia).  Long-term side effects: Chronic constipation. Sleep-disordered breathing (SDB). Increased risk of bone fractures. Hypothalamic-pituitary-adrenal dysregulation. Increased risk of overdose.  RISKS: Respiratory depression and death: Opioids increase the risk of respiratory depression and death.  Drug-to-drug interactions: Opioids are relatively contraindicated in combination with benzodiazepines, sleep inducers, and other central nervous system depressants. Other classes of medications  (i.e.: certain antibiotics and even over-the-counter medications) may also trigger or induce respiratory depression in some patients.  Medical conditions: Patients with pre-existing respiratory problems are at higher risk of respiratory failure and/or depression when in combination with opioid analgesics. Opioids are relatively contraindicated in some medical conditions such as central sleep apnea.   Fractures and Falls:  Opioids increase the risk and incidence of falls. This is of particular importance in elderly patients.  Endocrine System:  Long-term administration is associated with endocrine abnormalities (endocrinopathies). (Also known as Opioid-induced Endocrinopathy) Influences on both the hypothalamic-pituitary-adrenal axis?and the hypothalamic-pituitary-gonadal axis have been demonstrated with consequent hypogonadism and adrenal insufficiency in both sexes. Hypogonadism and decreased levels of dehydroepiandrosterone sulfate have been reported in men and women. Endocrine effects include: Amenorrhoea in women (abnormal absence of menstruation) Reduced libido in both sexes Decreased sexual function Erectile dysfunction in men Hypogonadisms (decreased testicular function with shrinkage of testicles) Infertility Depression and fatigue Loss of muscle mass Anxiety Depression Immune suppression Hyperalgesia Weight gain Anemia Osteoporosis Patients (particularly women of childbearing age) should avoid opioids. There is insufficient evidence to recommend routine monitoring of asymptomatic patients taking opioids in the long-term for hormonal deficiencies.  Immune System: Human studies have demonstrated that opioids have an immunomodulating effect. These effects are mediated via opioid receptors both on immune effector cells and in the central nervous system. Opioids have been demonstrated to have adverse effects on antimicrobial response and anti-tumour surveillance. Buprenorphine has  been demonstrated to have no impact on immune function.  Opioid Induced Hyperalgesia: Human studies have demonstrated that prolonged use of opioids can lead to a state of abnormal pain sensitivity, sometimes called opioid induced hyperalgesia (OIH). Opioid induced hyperalgesia is not usually seen in the absence of tolerance to opioid analgesia. Clinically, hyperalgesia may be diagnosed if the patient on long-term opioid therapy presents with increased pain. This might be qualitatively and anatomically distinct from pain related to disease progression or to breakthrough pain resulting from development of opioid tolerance. Pain associated with hyperalgesia tends to be more diffuse than the pre-existing pain and less defined in quality. Management of opioid induced hyperalgesia requires opioid dose reduction.  Cancer: Chronic opioid therapy has  been associated with an increased risk of cancer among noncancer patients with chronic pain. This association was more evident in chronic strong opioid users. Chronic opioid consumption causes significant pathological changes in the small intestine and colon. Epidemiological studies have found that there is a link between opium dependence and initiation of gastrointestinal cancers. Cancer is the second leading cause of death after cardiovascular disease. Chronic use of opioids can cause multiple conditions such as GERD, immunosuppression and renal damage as well as carcinogenic effects, which are associated with the incidence of cancers.   Mortality: Long-term opioid use has been associated with increased mortality among patients with chronic non-cancer pain (CNCP).  Prescription of long-acting opioids for chronic noncancer pain was associated with a significantly increased risk of all-cause mortality, including deaths from causes other than overdose.  Reference: Von Korff M, Kolodny A, Deyo RA, Chou R. Long-term opioid therapy reconsidered. Ann Intern Med. 2011  Sep 6;155(5):325-8. doi: 10.7326/[id number]. PMID: 16109604; PMCID: [id number]. Randon Goldsmith, Hayward RA, Dunn KM, [place] KP. Risk of adverse events in patients prescribed long-term opioids: A cohort study in the [place] [facility] Practice Research Datalink. Eur J Pain. 2019 May;23(5):908-922. doi: 10.1002/ejp.1357. Epub 2019 Jan 31. PMID: 78295621. Colameco S, Coren JS, Ciervo CA. Continuous opioid treatment for chronic noncancer pain: a time for moderation in prescribing. Postgrad Med. 2009 Jul;121(4):61-6. doi: 10.3810/pgm.2009.07.2032. PMID: 30865784. William Hamburger RN, Wilburn SD, Blazina I, Cristopher Peru, Bougatsos C, Deyo RA. The effectiveness and risks of long-term opioid therapy for chronic pain: a systematic review for a [employer] of [employer] Pathways to [employer]. Ann Intern Med. 2015 Feb 17;162(4):276-86. doi: 10.7326/M14-2559. PMID: 69629528. Caryl Bis [facility], Makuc DM. NCHS Data Brief No. 22. [place]: Centers for Disease Control and Prevention; 2009. Sep, Increase in Fatal Poisonings Involving Opioid Analgesics in the [place], 1999-2006. Song IA, Choi HR, Oh TK. Long-term opioid use and mortality in patients with chronic non-cancer pain: Ten-year follow-up study in [place] from 2010 through 2019. EClinicalMedicine. 2022 Jul 18;51:101558. doi: 10.1016/j.eclinm.2022.413244. PMID: 01027253; PMCID: [id number]. Huser, W., Schubert, T., Vogelmann, T. et al. All-cause mortality in patients with long-term opioid therapy compared with non-opioid analgesics for chronic non-cancer pain: a database study. BMC Med 18, 162 (2020). http://lester.info/ Rashidian H, Karie Kirks, Malekzadeh R, Haghdoost AA. An Ecological Study of the Association between Opiate Use and Incidence of Cancers. Addict Health. 2016 Fall;8(4):252-260. PMID: 59563875; PMCID: [id number].  Our Goal: Our goal is to control your  pain with means other than the use of opioid pain medications.  Our Recommendation: Talk to your physician about coming off of these medications. We can assist you with the tapering down and stopping these medicines. Based on the new information, even if you cannot completely stop the medication, a decrease in the dose may be associated with a lesser risk. Ask for other means of controlling the pain. Decrease or eliminate those factors that significantly contribute to your pain such as smoking, obesity, and a diet heavily tilted towards "inflammatory" nutrients.  Last Updated: 03/08/2023   ______________________________________________________________________       ______________________________________________________________________    National Pain Medication Shortage  The [place] is experiencing worsening drug shortages. These have had a negative widespread effect on patient care and treatment. Not expected to improve any time soon. Predicted to last past 2029.   Drug shortage list (generic names) Oxycodone IR Oxycodone/APAP Oxymorphone IR Hydromorphone Hydrocodone/APAP Morphine  Where is the problem?  Manufacturing and  supply level.  Will this shortage affect you?  Only if you take any of the above pain medications.  How? You may be unable to fill your prescription.  Your pharmacist may offer a "partial fill" of your prescription. (Warning: Do not accept partial fills.) Prescriptions partially filled cannot be transferred to another pharmacy. Read our Medication Rules and Regulation. Depending on how much medicine you are dependent on, you may experience withdrawals when unable to get the medication.  Recommendations: Consider ending your dependence on opioid pain medications. Ask your pain specialist to assist you with the process. Consider switching to a medication currently not in shortage, such as Buprenorphine. Talk to your pain specialist about this option. Consider  decreasing your pain medication requirements by managing tolerance thru "Drug Holidays". This may help minimize withdrawals, should you run out of medicine. Control your pain thru the use of non-pharmacological interventional therapies.   Your prescriber: Prescribers cannot be blamed for shortages. Medication manufacturing and supply issues cannot be fixed by the prescriber.   NOTE: The prescriber is not responsible for supplying the medication, or solving supply issues. Work with your pharmacist to solve it. The patient is responsible for the decision to take or continue taking the medication and for identifying and securing a legal supply source. By law, supplying the medication is the job and responsibility of the pharmacy. The prescriber is responsible for the evaluation, monitoring, and prescribing of these medications.   Prescribers will NOT: Re-issue prescriptions that have been partially filled. Re-issue prescriptions already sent to a pharmacy.  Re-send prescriptions to a different pharmacy because yours did not have your medication. Ask pharmacist to order more medicine or transfer the prescription to another pharmacy. (Read below.)  New 2023 regulation: "May 01, 2022 Revised Regulation Allows DEA-Registered Pharmacies to Transfer Electronic Prescriptions at a Patient's Request DEA Headquarters Division - Public Information Office Patients now have the ability to request their electronic prescription be transferred to another pharmacy without having to go back to their practitioner to initiate the request. This revised regulation went into effect on Monday, April 27, 2022.     At a patient's request, a DEA-registered retail pharmacy can now transfer an electronic prescription for a controlled substance (schedules II-V) to another DEA-registered retail pharmacy. Prior to this change, patients would have to go through their practitioner to cancel their prescription and have it  re-issued to a different pharmacy. The process was taxing and time consuming for both patients and practitioners.    The Drug Enforcement Administration [facility]) published its intent to revise the process for transferring electronic prescriptions on July 19, 2020.  The final rule was published in the federal register on March 26, 2022 and went into effect 30 days later.  Under the final rule, a prescription can only be transferred once between pharmacies, and only if allowed under existing state or other applicable law. The prescription must remain in its electronic form; may not be altered in any way; and the transfer must be communicated directly between two licensed pharmacists. It's important to note, any authorized refills transfer with the original prescription, which means the entire prescription will be filled at the same pharmacy".  Reference: [email or web address] [facility] website announcement)  [email or web address] [employer])   [employer] / Vol. 88, No. 143 / Thursday, March 26, 2022 / Rules and Regulations DEPARTMENT OF JUSTICE  Drug Enforcement Administration  21 CFR Part 1306  [Docket No. [id number]]  RIN [id number] Transfer of Electronic Prescriptions  for Schedules II-V Controlled Substances Between Pharmacies for Initial Filling  ______________________________________________________________________       ______________________________________________________________________    Transfer of Pain Medication between Pharmacies  Re: 2023 DEA Clarification on existing regulation  Published on DEA Website: May 01, 2022  Title: Revised Regulation Allows DEA-Registered Pharmacies to Electrical engineer Prescriptions at a Patient's Request DEA Headquarters Division - [employer]  "Patients now  have the ability to request their electronic prescription be transferred to another pharmacy without having to go back to their practitioner to initiate the request. This revised regulation went into effect on Monday, April 27, 2022.     At a patient's request, a DEA-registered retail pharmacy can now transfer an electronic prescription for a controlled substance (schedules II-V) to another DEA-registered retail pharmacy. Prior to this change, patients would have to go through their practitioner to cancel their prescription and have it re-issued to a different pharmacy. The process was taxing and time consuming for both patients and practitioners.    The Drug Enforcement Administration [facility]) published its intent to revise the process for transferring electronic prescriptions on July 19, 2020.  The final rule was published in the federal register on March 26, 2022 and went into effect 30 days later.  Under the final rule, a prescription can only be transferred once between pharmacies, and only if allowed under existing state or other applicable law. The prescription must remain in its electronic form; may not be altered in any way; and the transfer must be communicated directly between two licensed pharmacists. It's important to note, any authorized refills transfer with the original prescription, which means the entire prescription will be filled at the same pharmacy."    REFERENCES: 1. DEA website announcement [email or web address]  2. Department of Justice website  [email or web address]  3. DEPARTMENT OF JUSTICE Drug Enforcement Administration 21 CFR Part 1306 [Docket No. [id number]] RIN [id number] "Transfer of Electronic Prescriptions for Schedules II-V Controlled Substances Between Pharmacies for Initial  Filling"  ______________________________________________________________________       ______________________________________________________________________    Medication Rules  Purpose: To inform patients, and their family members, of our medication rules and regulations.  Applies to: All patients receiving prescriptions from our practice (written or electronic).  Pharmacy of record: This is the pharmacy where your electronic prescriptions will be sent. Make sure we have the correct one.  Electronic prescriptions: In compliance with the [facility] Strengthen Opioid Misuse Prevention (STOP) Act of 2017 (Session Conni Elliot [phone]), effective August 31, 2018, all controlled substances must be electronically prescribed. Written prescriptions, faxing, or calling prescriptions to a pharmacy will no longer be done.  Prescription refills: These will be provided only during in-person appointments. No medications will be renewed without a "face-to-face" evaluation with your provider. Applies to all prescriptions.  NOTE: The following applies primarily to controlled substances (Opioid* Pain Medications).   Type of encounter (visit): For patients receiving controlled substances, face-to-face visits are required. (Not an option and not up to the patient.)  Patient's Responsibilities: Pain Pills: Bring all pain pills to every appointment (except for procedure appointments). Pill counts are required.  Pill Bottles: Bring pills in original pharmacy bottle. Bring bottle, even if empty. Always bring the bottle of the most recent fill.  Medication refills: You are responsible for knowing and keeping track of what medications you are taking and when is it that you will need a refill. The day before your appointment: write a list of all prescriptions that need to be refilled. The day of  the appointment: give the list to the admitting nurse. Prescriptions will be written only during appointments. No  prescriptions will be written on procedure days. If you forget a medication: it will not be "Called in", "Faxed", or "electronically sent". You will need to get another appointment to get these prescribed. No early refills. Do not call asking to have your prescription filled early. Partial  or short prescriptions: Occasionally your pharmacy may not have enough pills to fill your prescription.  NEVER ACCEPT a partial fill or a prescription that is short of the total amount of pills that you were prescribed.  With controlled substances the law allows 72 hours for the pharmacy to complete the prescription.  If the prescription is not completed within 72 hours, the pharmacist will require a new prescription to be written. This means that you will be short on your medicine and we WILL NOT send another prescription to complete your original prescription.  Instead, request the pharmacy to send a carrier to a nearby branch to get enough medication to provide you with your full prescription. Prescription Accuracy: You are responsible for carefully inspecting your prescriptions before leaving our office. Have the discharge nurse carefully go over each prescription with you, before taking them home. Make sure that your name is accurately spelled, that your address is correct. Check the name and dose of your medication to make sure it is accurate. Check the number of pills, and the written instructions to make sure they are clear and accurate. Make sure that you are given enough medication to last until your next medication refill appointment. Taking Medication: Take medication as prescribed. When it comes to controlled substances, taking less pills or less frequently than prescribed is permitted and encouraged. Never take more pills than instructed. Never take the medication more frequently than prescribed.  Inform other Doctors: Always inform, all of your healthcare providers, of all the medications you take. Pain  Medication from other Providers: You are not allowed to accept any additional pain medication from any other Doctor or Healthcare provider. There are two exceptions to this rule. (see below) In the event that you require additional pain medication, you are responsible for notifying us, as stated below. Cough Medicine: Often these contain an opioid, such as codeine or hydrocodone. Never accept or take cough medicine containing these opioids if you are already taking an opioid* medication. The combination may cause respiratory failure and death. Medication Agreement: You are responsible for carefully reading and following our Medication Agreement. This must be signed before receiving any prescriptions from our practice. Safely store a copy of your signed Agreement. Violations to the Agreement will result in no further prescriptions. (Additional copies of our Medication Agreement are available upon request.) Laws, Rules, & Regulations: All patients are expected to follow all [address] and [employer], Rules, [employer]. Ignorance of the Laws does not constitute a valid excuse.  Illegal drugs and Controlled Substances: The use of illegal substances (including, but not limited to marijuana and its derivatives) and/or the illegal use of any controlled substances is strictly prohibited. Violation of this rule may result in the immediate and permanent discontinuation of any and all prescriptions being written by our practice. The use of any illegal substances is prohibited. Adopted CDC guidelines & recommendations: Target dosing levels will be at or below 60 MME/day. Use of benzodiazepines** is not recommended. Urine Drug testing: Patients taking controlled substances will be required to provide a urine sample upon request. Do not void before  coming to your medication management appointments. Hold emptying your bladder until you are admitted. The admitting nurse will inform you if a sample is required. Our  practice reserves the right to call you at any time to provide a sample. Once receiving the call, you have 24 hours to comply with request. Not providing a sample upon request may result in termination of medication therapy.  Exceptions: There are only two exceptions to the rule of not receiving pain medications from other Healthcare Providers. Exception #1 (Emergencies): In the event of an emergency (i.e.: accident requiring emergency care), you are allowed to receive additional pain medication. However, you are responsible for: As soon as you are able, call our office [phone], at any time of the day or night, and leave a message stating your name, the date and nature of the emergency, and the name and dose of the medication prescribed. In the event that your call is answered by a member of our staff, make sure to document and save the date, time, and the name of the person that took your information.  Exception #2 (Planned Surgery): In the event that you are scheduled by another doctor or dentist to have any type of surgery or procedure, you are allowed (for a period no longer than 30 days), to receive additional pain medication, for the acute post-op pain. However, in this case, you are responsible for picking up a copy of our "Post-op Pain Management for Surgeons" handout, and giving it to your surgeon or dentist. This document is available at our office, and does not require an appointment to obtain it. Simply go to our office during business hours (Monday-Thursday from 8:00 AM to 4:00 PM) (Friday 8:00 AM to 12:00 Noon) or if you have a scheduled appointment with [place], prior to your surgery, and ask for it by name. In addition, you are responsible for: calling our office [phone], at any time of the day or night, and leaving a message stating your name, name of your surgeon, type of surgery, and date of procedure or surgery. Failure to comply with your responsibilities may result in termination  of therapy involving the controlled substances.  Consequences:  Non-compliance with the above rules may result in permanent discontinuation of medication prescription therapy. All patients receiving any type of controlled substance is expected to comply with the above patient responsibilities. Not doing so may result in permanent discontinuation of medication prescription therapy. Medication Agreement Violation. Following the above rules, including your responsibilities will help you in avoiding a Medication Agreement Violation ("Breaking your Pain Medication Contract").  *Opioid medications include: morphine, codeine, oxycodone, oxymorphone, hydrocodone, hydromorphone, meperidine, tramadol, tapentadol, buprenorphine, fentanyl, methadone. **Benzodiazepine medications include: diazepam (Valium), alprazolam (Xanax), clonazepam (Klonopine), lorazepam (Ativan), clorazepate (Tranxene), chlordiazepoxide (Librium), estazolam (Prosom), oxazepam (Serax), temazepam (Restoril), triazolam (Halcion) (Last updated: 06/23/2023) ______________________________________________________________________      ______________________________________________________________________    Medication Recommendations and Reminders  Applies to: All patients receiving prescriptions (written and/or electronic).  Medication Rules & Regulations: You are responsible for reading, knowing, and following our "Medication Rules" document. These exist for your safety and that of others. They are not flexible and neither are we. Dismissing or ignoring them is an act of "non-compliance" that may result in complete and irreversible termination of such medication therapy. For safety reasons, "non-compliance" will not be tolerated. As with the [place] fundamental legal principle of "ignorance of the law is no defense", we will accept no excuses for not having read and knowing the content  of documents provided to you by our practice.  Pharmacy of  record:  Definition: This is the pharmacy where your electronic prescriptions will be sent.  We do not endorse any particular pharmacy. It is up to you and your insurance to decide what pharmacy to use.  We do not restrict you in your choice of pharmacy. However, once we write for your prescriptions, we will NOT be re-sending more prescriptions to fix restricted supply problems created by your pharmacy, or your insurance.  The pharmacy listed in the electronic medical record should be the one where you want electronic prescriptions to be sent. If you choose to change pharmacy, simply notify our nursing staff. Changes will be made only during your regular appointments and not over the phone.  Recommendations: Keep all of your pain medications in a safe place, under lock and key, even if you live alone. We will NOT replace lost, stolen, or damaged medication. We do not accept "Police Reports" as proof of medications having been stolen. After you fill your prescription, take 1 week's worth of pills and put them away in a safe place. You should keep a separate, properly labeled bottle for this purpose. The remainder should be kept in the original bottle. Use this as your primary supply, until it runs out. Once it's gone, then you know that you have 1 week's worth of medicine, and it is time to come in for a prescription refill. If you do this correctly, it is unlikely that you will ever run out of medicine. To make sure that the above recommendation works, it is very important that you make sure your medication refill appointments are scheduled at least 1 week before you run out of medicine. To do this in an effective manner, make sure that you do not leave the office without scheduling your next medication management appointment. Always ask the nursing staff to show you in your prescription , when your medication will be running out. Then arrange for the receptionist to get you a return appointment, at least  7 days before you run out of medicine. Do not wait until you have 1 or 2 pills left, to come in. This is very poor planning and does not take into consideration that we may need to cancel appointments due to bad weather, sickness, or emergencies affecting our staff. DO NOT ACCEPT A "Partial Fill": If for any reason your pharmacy does not have enough pills/tablets to completely fill or refill your prescription, do not allow for a "partial fill". The law allows the pharmacy to complete that prescription within 72 hours, without requiring a new prescription. If they do not fill the rest of your prescription within those 72 hours, you will need a separate prescription to fill the remaining amount, which we will NOT provide. If the reason for the partial fill is your insurance, you will need to talk to the pharmacist about payment alternatives for the remaining tablets, but again, DO NOT ACCEPT A PARTIAL FILL, unless you can trust your pharmacist to obtain the remainder of the pills within 72 hours.  Prescription refills and/or changes in medication(s):  Prescription refills, and/or changes in dose or medication, will be conducted only during scheduled medication management appointments. (Applies to both, written and electronic prescriptions.) No refills on procedure days. No medication will be changed or started on procedure days. No changes, adjustments, and/or refills will be conducted on a procedure day. Doing so will interfere with the diagnostic portion of the procedure. No  phone refills. No medications will be "called into the pharmacy". No Fax refills. No weekend refills. No Holliday refills. No after hours refills.  Remember:  Business hours are:  Monday to Thursday 8:00 AM to 4:00 PM Provider's Schedule: Delano Metz, MD - Appointments are:  Medication management: Monday and Wednesday 8:00 AM to 4:00 PM Procedure day: Tuesday and Thursday 7:30 AM to 4:00 PM Edward Jolly, MD -  Appointments are:  Medication management: Tuesday and Thursday 8:00 AM to 4:00 PM Procedure day: Monday and Wednesday 7:30 AM to 4:00 PM (Last update: 06/23/2022) ______________________________________________________________________      ______________________________________________________________________     Naloxone Nasal Spray  Why am I receiving this medication? [place] STOP ACT requires that all patients taking high dose opioids or at risk of opioids respiratory depression, be prescribed an opioid reversal agent, such as Naloxone (AKA: Narcan).  What is this medication? NALOXONE (nal OX one) treats opioid overdose, which causes slow or shallow breathing, severe drowsiness, or trouble staying awake. Call emergency services after using this medication. You may need additional treatment. Naloxone works by reversing the effects of opioids. It belongs to a group of medications called opioid blockers.  COMMON BRAND NAME(S): Kloxxado, Narcan  What should I tell my care team before I take this medication? They need to know if you have any of these conditions: Heart disease Substance use disorder An unusual or allergic reaction to naloxone, other medications, foods, dyes, or preservatives Pregnant or trying to get pregnant Breast-feeding  When to use this medication? This medication is to be used for the treatment of respiratory depression (less than 8 breaths per minute) secondary to opioid overdose.   How to use this medication? This medication is for use in the nose. Lay the person on their back. Support their neck with your hand and allow the head to tilt back before giving the medication. The nasal spray should be given into 1 nostril. After giving the medication, move the person onto their side. Do not remove or test the nasal spray until ready to use. Get emergency medical help right away after giving the first dose of this medication, even if the person wakes up. You  should be familiar with how to recognize the signs and symptoms of a narcotic overdose. If more doses are needed, give the additional dose in the other nostril. Talk to your care team about the use of this medication in children. While this medication may be prescribed for children as young as newborns for selected conditions, precautions do apply.  Naloxone Overdosage: If you think you have taken too much of this medicine contact a poison control center or emergency room at once.  NOTE: This medicine is only for you. Do not share this medicine with others.  What if I miss a dose? This does not apply.  What may interact with this medication? This is only used during an emergency. No interactions are expected during emergency use. This list may not describe all possible interactions. Give your health care provider a list of all the medicines, herbs, non-prescription drugs, or dietary supplements you use. Also tell them if you smoke, drink alcohol, or use illegal drugs. Some items may interact with your medicine.  What should I watch for while using this medication? Keep this medication ready for use in the case of an opioid overdose. Make sure that you have the phone number of your care team and local hospital ready. You may need to have additional doses of  this medication. Each nasal spray contains a single dose. Some emergencies may require additional doses. After use, bring the treated person to the nearest hospital or call 911. Make sure the treating care team knows that the person has received a dose of this medication. You will receive additional instructions on what to do during and after use of this medication before an emergency occurs.  What side effects may I notice from receiving this medication? Side effects that you should report to your care team as soon as possible: Allergic reactions--skin rash, itching, hives, swelling of the face, lips, tongue, or throat Side effects that  usually do not require medical attention (report these to your care team if they continue or are bothersome): Constipation Dryness or irritation inside the nose Headache Increase in blood pressure Muscle spasms Stuffy nose Toothache This list may not describe all possible side effects. Call your doctor for medical advice about side effects. You may report side effects to FDA at 1-800-FDA-1088.  Where should I keep my medication? Because this is an emergency medication, you should keep it with you at all times.  Keep out of the reach of children and pets. Store between 20 and 25 degrees C (68 and 77 degrees F). Do not freeze. Throw away any unused medication after the expiration date. Keep in original box until ready to use.  NOTE: This sheet is a summary. It may not cover all possible information. If you have questions about this medicine, talk to your doctor, pharmacist, or health care provider.   2023 [employer]/Gold Standard (2021-04-25 00:00:00)  ______________________________________________________________________

## 2023-11-02 NOTE — Progress Notes (Signed)
 Safety precautions to be maintained throughout the outpatient stay will include: orient to surroundings, keep bed in low position, maintain call bell within reach at all times, provide assistance with transfer out of bed and ambulation.

## 2023-11-03 ENCOUNTER — Telehealth: Payer: Self-pay

## 2023-11-03 NOTE — Telephone Encounter (Signed)
 Post procedure follow up.  LM

## 2023-11-07 NOTE — Progress Notes (Unsigned)
 PROVIDER NOTE: Interpretation of information contained herein should be left to medically-trained personnel. Specific patient instructions are provided elsewhere under "Patient Instructions" section of medical record. This document was created in part using STT-dictation technology, any transcriptional errors that may result from this process are unintentional.  Patient: Kendra Nelson Type: Established DOB: [birth year] MRN: [id number] PCP: Iona Hansen, NP  Service: Procedure DOS: 11/09/2023 Setting: Ambulatory Location: Ambulatory outpatient facility Delivery: Face-to-face Provider: Oswaldo Done, MD Specialty: Interventional Pain Management Specialty designation: 09 Location: Outpatient facility Ref. Prov.: Iona Hansen, NP       Interventional Therapy   Primary Reason for Visit: Interventional Pain Management Treatment. CC: No chief complaint on file.  Procedure:          Type: Management of Intrathecal Drug Delivery System (IDDS) - Reservoir Refill (78295). No rate change.  Indications: 1. Chronic pain syndrome   2. Chronic low back pain (1ry area of Pain) (Bilateral) (R>L) w/ sciatica (Bilateral)   3. Chronic lower extremity pain (2ry area of Pain) (Bilateral) (R>L)   4. 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 Lumbar facet syndrome (Bilateral) (R>L)   6. Chronic lumbosacral radiculopathy (L5) (Left)   7. Degeneration of intervertebral disc of lumbar region with discogenic back pain and lower extremity pain   8. Presence of neurostimulator   9. Presence of intrathecal pump   10. Pharmacologic therapy   11. Long term prescription opiate use    Pain Assessment: Self-Reported Pain Score:  /10             Reported level is compatible with observation.          Intrathecal Drug Delivery System (IDDS)  Pump Device:  Manufacturer: Medtronic Model: Synchromed II Model No.: [id number] Serial No.: [id number] Delivery Route:  Intrathecal Type: Programmable  Volume (mL): 40 mL reservoir Priming Volume: n/a  Calibration Constant: 120.0  MRI compatibility: Conditional   Implant Details:  Date: 05/09/2019 Implanter: Odette Fraction, MD  Contact Information: [facility] Last Revision/Replacement: 05/09/2019 Estimated Replacement Date: May/2027  Implant Site: Abdominal Laterality: Left  Catheter: Manufacturer: Medtronic Model:  (two piece) Model No.: [id number]  Serial No.: n/a  Implanted Length (cm): 38.1  Catheter Volume (mL): 0.214  Tip Location (Level): T6-7 Canal Access Site: n/a  Drug content:  Primary Medication Class: Opioid  Medication: PF-Fentanyl  Concentration: 1,000 mcg/mL   Secondary Medication Class: Local Anesthetic  Medication: PF-Bupivacaine  Concentration: 20.0 mg/mL   Tertiary Medication Class: none   PA parameters (PCA-mode):  Mode: Off (Inactive)  Programming:  Type: Simple continuous.  Medication, Concentration, Infusion Program, & Delivery Rate: For up-to-date details please see most recent scanned programming printout.   Changes:  Medication Change: None at this point Rate Change: No change in rate  Reported side-effects or adverse reactions: None reported  Effectiveness: Described as relatively effective, allowing for increase in activities of daily living (ADL) Clinically meaningful improvement in function (CMIF): Sustained CMIF goals met  Plan: Pump refill today   Pharmacotherapy Assessment   Opioid Analgesic:  Morphine (MS Contin) 30 mg PO BID (60 mg/day of morphine) (60 MME/day) +  PF-(intrathecal)-Fentanyl (13.4 mcg/hr)  changed from Oxycodone IR 10 mg 4x/day (40 mg/day of oxycodone) (60 MME/day), due to national shortage MME/day: 60 MME/day (oral) + 32.16 MME/day (intrathecal) = 93.16 MME/day (Aprox. 1.16 MME/day/kg).   Monitoring: [place] PMP: PDMP reviewed during this encounter.       Pharmacotherapy: No side-effects or  adverse  reactions reported. Compliance: No problems identified. Effectiveness: Clinically acceptable. Plan: Refer to "POC". UDS:  Summary  Date Value Ref Range Status  02/17/2023 Note  Final    Comment:    ==================================================================== [employer] Select 13 (MW) ==================================================================== Test                             Result       Flag       Units  Drug Present and Declared for Prescription Verification   Morphine                       >6849        EXPECTED   ng/mg creat   Normorphine                    440          EXPECTED   ng/mg creat    Potential sources of large amounts of morphine in the absence of    codeine include administration of morphine or use of heroin.     Normorphine is an expected metabolite of morphine.    Hydromorphone                  71           EXPECTED   ng/mg creat    Hydromorphone may be present as a metabolite of morphine;    concentrations of hydromorphone rarely exceed 5% of the morphine    concentration when this is the source of hydromorphone.    Fentanyl                       27           EXPECTED   ng/mg creat   Norfentanyl                    95           EXPECTED   ng/mg creat    Source of fentanyl is a scheduled prescription medication, including    IV, patch, and transmucosal formulations. Norfentanyl is an expected    metabolite of fentanyl.  ==================================================================== Test                      Result    Flag   Units      Ref Range   Creatinine              146              mg/dL      >=40 ==================================================================== Declared Medications:  The flagging and interpretation on this report are based on the  following declared medications.  Unexpected results may arise from  inaccuracies in the declared medications.   **Note: The testing scope of this panel includes these medications:    Fentanyl  Morphine (MS Contin)   **Note: The testing scope of this panel does not include the  following reported medications:   Atorvastatin (Lipitor)  Bupivacaine  Carvedilol (Coreg)  Furosemide (Lasix)  Lisinopril (Zestril)  Naloxone (Narcan)  Vitamin D2 ==================================================================== For clinical consultation, please call [phone]. ====================================================================    No results found for: "CBDTHCR", "D8THCCBX", "D9THCCBX"   H&P (Pre-op Assessment):  Ms. Brame is a 63 y.o. (year old), female patient, seen today for interventional treatment. 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 Ms. Rings has a current medication list which includes the following prescription(s): AMBULATORY NON FORMULARY MEDICATION, atorvastatin, carvedilol, furosemide, lisinopril, morphine, [START ON 12/08/2023] morphine, [START ON 01/07/2024] morphine, and naloxone. Her primarily concern today is the No chief complaint on file.  Initial Vital Signs:  Pulse/HCG Rate:    Temp:   Resp:   BP:   SpO2:    BMI: Estimated body mass index is 30.11 kg/m as calculated from the following:   Height as of 11/02/23: 5\' 3"  (1.6 m).   Weight as of 11/02/23: 170 lb (77.1 kg).  Risk Assessment: Allergies: Reviewed. She is allergic to benadryl [diphenhydramine hcl], bee venom, and dexamethasone.  Allergy Precautions: None required Coagulopathies: Reviewed. None identified.  Blood-thinner therapy: None at this time Active Infection(s): Reviewed. None identified.  Ms. Gause is afebrile  Site Confirmation: Ms. Franta was asked to confirm the procedure and laterality before marking the site Procedure checklist: Completed Consent: Before the procedure and under the influence of no sedative(s), amnesic(s), or anxiolytics, the patient was informed of the treatment options, risks and possible complications. To fulfill our ethical and legal obligations, as recommended by the American Medical Association's Code of Ethics, I have informed the patient of my clinical impression; the nature and purpose of the treatment or procedure; the risks, benefits, and possible complications of the intervention; the alternatives, including doing nothing; the risk(s) and benefit(s) of the alternative treatment(s) or procedure(s); and the risk(s) and benefit(s) of doing nothing.  Ms. Vanaken was provided with information about the general risks and possible complications associated with most interventional procedures. These include, but are not limited to: failure to achieve desired goals, infection, bleeding, organ or nerve damage, allergic reactions, paralysis, and/or death.  In addition, she was informed of those risks and possible complications associated to this particular procedure, which include, but are not limited to: damage to the implant; failure to decrease pain; local, systemic, or serious CNS infections, intraspinal abscess with possible cord compression and paralysis, or life-threatening such as meningitis; bleeding; organ damage; nerve injury or damage with subsequent sensory, motor, and/or autonomic system dysfunction, resulting in transient or permanent pain, numbness, and/or weakness of one or several areas of the body; allergic reactions, either minor or major life-threatening, such as anaphylactic or anaphylactoid reactions.  Furthermore, Ms. Crass was informed of those risks and complications associated with the medications. These include, but are not  limited to: allergic reactions (i.e.: anaphylactic or anaphylactoid reactions); endorphine suppression; bradycardia and/or hypotension; water retention and/or peripheral vascular relaxation leading to lower extremity edema and possible stasis ulcers; respiratory depression and/or shortness of breath; decreased metabolic rate leading to weight gain; swelling or edema; medication-induced neural toxicity; particulate matter embolism and blood vessel occlusion with resultant organ, and/or nervous system infarction; and/or intrathecal granuloma formation with possible spinal cord compression and permanent paralysis.  Before refilling the pump Ms. Wiginton was informed that some of the medications used in the devise may not be FDA approved for such use and therefore it constitutes an off-label use of the medications.  Finally, she was informed that Medicine is not an exact science; therefore, there is also the possibility of unforeseen or unpredictable risks and/or possible complications that may result in a catastrophic  outcome. The patient indicated having understood very clearly. We have given the patient no guarantees and we have made no promises. Enough time was given to the patient to ask questions, all of which were answered to the patient's satisfaction. Ms. Pask has indicated that she wanted to continue with the procedure. Attestation: I, the ordering provider, attest that I have discussed with the patient the benefits, risks, side-effects, alternatives, likelihood of achieving goals, and potential problems during recovery for the procedure that I have provided informed consent. Date  Time: {CHL [facility]-PAIN TIME CHOICES:21018001}  Pre-Procedure Preparation:  Monitoring: As per clinic protocol. Respiration, ETCO2, SpO2, BP, heart rate and rhythm monitor placed and checked for adequate function Safety Precautions: Patient was assessed for positional comfort and pressure points before starting the  procedure. Time-out: I initiated and conducted the "Time-out" before starting the procedure, as per protocol. The patient was asked to participate by confirming the accuracy of the "Time Out" information. Verification of the correct person, site, and procedure were performed and confirmed by me, the nursing staff, and the patient. "Time-out" conducted as per Joint Commission's Universal Protocol (UP.01.01.01). Time:   Start Time:   hrs.  Description of Procedure:          Position: Supine Target Area: Central-port of intrathecal pump. Approach: Anterior, 90 degree angle approach. Area Prepped: Entire Area around the pump implant. ChloraPrep (2% chlorhexidine gluconate and 70% isopropyl alcohol) Safety Precautions: Aspiration looking for blood return was conducted prior to all injections. At no point did we inject any substances, as a needle was being advanced. No attempts were made at seeking any paresthesias. Safe injection practices and needle disposal techniques used. Medications properly checked for expiration dates. SDV (single dose vial) medications used. Description of the Procedure: Protocol guidelines were followed. Two nurses trained to do implant refills were present during the entire procedure. The refill medication was checked by both healthcare providers as well as the patient. The patient was included in the "Time-out" to verify the medication. The patient was placed in position. The pump was identified. The area was prepped in the usual manner. The sterile template was positioned over the pump, making sure the side-port location matched that of the pump. Both, the pump and the template were held for stability. The needle provided in the Medtronic Kit was then introduced thru the center of the template and into the central port. The pump content was aspirated and discarded volume documented. The new medication was slowly infused into the pump, thru the filter, making sure to avoid  overpressure of the device. The needle was then removed and the area cleansed, making sure to leave some of the prepping solution back to take advantage of its long term bactericidal properties. The pump was interrogated and programmed to reflect the correct medication, volume, and dosage. The program was printed and taken to the physician for approval. Once checked and signed by the physician, a copy was provided to the patient and another scanned into the EMR.  There were no vitals filed for this visit.  Start Time:   hrs. End Time:   hrs. Materials & Medications: Medtronic Refill Kit Medication(s): Please see chart orders for details.  Type of Imaging Technique: None used Indication(s): N/A Exposure Time: No patient exposure Contrast: None used. Fluoroscopic Guidance: N/A Ultrasound Guidance: N/A Interpretation: N/A  Antibiotic Prophylaxis:   Anti-infectives (From admission, onward)    None      Indication(s): None identified  Post-operative Assessment:  Post-procedure Vital  Signs:  Pulse/HCG Rate:    Temp:   Resp:   BP:   SpO2:    EBL: None  Complications: No immediate post-treatment complications observed by team, or reported by patient.  Note: The patient tolerated the entire procedure well. A repeat set of vitals were taken after the procedure and the patient was kept under observation following institutional policy, for this type of procedure. Post-procedural neurological assessment was performed, showing return to baseline, prior to discharge. The patient was provided with post-procedure discharge instructions, including a section on how to identify potential problems. Should any problems arise concerning this procedure, the patient was given instructions to immediately contact us, at any time, without hesitation. In any case, we plan to contact the patient by telephone for a follow-up status report regarding this interventional procedure.  Comments:  No additional  relevant information.  Plan of Care (POC)  Orders:  No orders of the defined types were placed in this encounter.  Chronic Opioid Analgesic:   Morphine (MS Contin) 30 mg PO BID (60 mg/day of morphine) (60 MME/day) +  PF-(intrathecal)-Fentanyl (13.4 mcg/hr)  changed from Oxycodone IR 10 mg 4x/day (40 mg/day of oxycodone) (60 MME/day), due to national shortage MME/day: 60 MME/day (oral) + 32.16 MME/day (intrathecal) = 93.16 MME/day (Aprox. 1.16 MME/day/kg).   Medications ordered for procedure: No orders of the defined types were placed in this encounter.  Medications administered: Steward Drone A. Missey had no medications administered during this visit.  See the medical record for exact dosing, route, and time of administration.  Follow-up plan:   No follow-ups on file.       Interventional Therapies  Risk Factors  Considerations:   ELIQUIS Anticoagulation (Stop: 3 days  Restart: 6 hours) Severe anxiety and agoraphobia  Poor candidate for RFA secondary to Lumbar hardware   Planned  Pending:   Intrathecal pump refill as per pump programming.   Under consideration:   Diagnostic left IA knee joint inj  Diagnostic bilateral L2 TFESI  Diagnostic left genicular NB  Possible left genicular nerve RFA  Diagnostic caudal ESI + epidurogram  Possible Racz procedure    Completed:   Palliative intrathecal pump refills and adjustments  Intrathecal pump insertion by Dr. Maeola Harman [facility]) (05/10/2012)  Intrathecal pump replacement by Dr. Odette Fraction [facility]) (05/09/2019)  Spinal cord stimulator replacement by Dr. Maeola Harman [facility]) (06/09/2016)    Therapeutic  Palliative (PRN) options:   Therapeutic/palliative intrathecal pump  management (analysis and refill)       Recent Visits Date Type Provider Dept  11/02/23 Procedure visit Delano Metz, MD [facility]  08/10/23 Office Visit Delano Metz, MD [facility]  Showing recent visits within past 90 days and meeting all other requirements Future Appointments Date Type Provider Dept  11/09/23 Appointment Delano Metz, MD [facility]  Showing future appointments within next 90 days and meeting all other requirements  Disposition: Discharge home  Discharge (Date  Time): 11/09/2023;   hrs.   Primary Care Physician: Iona Hansen, NP Location: [facility] Note by: Oswaldo Done, MD (TTS technology used. I apologize for any typographical errors that were not detected and corrected.) Date: 11/09/2023; Time: 7:40 AM  Disclaimer:  Medicine is not an Visual merchandiser. The only guarantee in medicine is that nothing is guaranteed. It is important to note that the decision to proceed with this intervention was based on the information collected from the patient. The Data and conclusions  were drawn from the patient's questionnaire, the interview, and the physical examination. Because the information was provided in large part by the patient, it cannot be guaranteed that it has not been purposely or unconsciously manipulated. Every effort has been made to obtain as much relevant data as possible for this evaluation. It is important to note that the conclusions that lead to this procedure are derived in large part from the available data. Always take into account that the treatment will also be dependent on availability of resources and existing treatment guidelines, considered by other Pain Management Practitioners as being common knowledge and practice, at the time of the intervention. For Medico-Legal purposes, it is also important to point out that variation in procedural techniques and pharmacological choices are the acceptable norm. The indications, contraindications, technique, and results of the above procedure should only be interpreted and judged by a Board-Certified Interventional Pain Specialist with  extensive familiarity and expertise in the same exact procedure and technique.

## 2023-11-08 ENCOUNTER — Telehealth: Payer: Self-pay

## 2023-11-08 ENCOUNTER — Other Ambulatory Visit: Payer: Self-pay

## 2023-11-08 DIAGNOSIS — Z79891 Long term (current) use of opiate analgesic: Secondary | ICD-10-CM

## 2023-11-08 DIAGNOSIS — M961 Postlaminectomy syndrome, not elsewhere classified: Secondary | ICD-10-CM

## 2023-11-08 DIAGNOSIS — G8929 Other chronic pain: Secondary | ICD-10-CM

## 2023-11-08 DIAGNOSIS — M5417 Radiculopathy, lumbosacral region: Secondary | ICD-10-CM

## 2023-11-08 DIAGNOSIS — Z79899 Other long term (current) drug therapy: Secondary | ICD-10-CM

## 2023-11-08 DIAGNOSIS — M47816 Spondylosis without myelopathy or radiculopathy, lumbar region: Secondary | ICD-10-CM

## 2023-11-08 DIAGNOSIS — G894 Chronic pain syndrome: Secondary | ICD-10-CM

## 2023-11-08 MED ORDER — MORPHINE SULFATE ER 30 MG PO TBCR: 30.0000 mg | EXTENDED_RELEASE_TABLET | Freq: Two times a day (BID) | ORAL | 0 refills | Status: DC

## 2023-11-08 NOTE — Telephone Encounter (Signed)
Refill request has been sent to Dr Dossie Arbour

## 2023-11-08 NOTE — Telephone Encounter (Signed)
 Her [facility] doesn't [facility] medicine that is due today. Can [facility] be [facility] on [address] [place]. He wants someone to call him when it has been done.

## 2023-11-09 ENCOUNTER — Ambulatory Visit: Attending: Pain Medicine | Admitting: Pain Medicine

## 2023-11-09 ENCOUNTER — Encounter: Payer: Self-pay | Admitting: Pain Medicine

## 2023-11-09 VITALS — BP 145/60 | HR 63 | Temp 97.1°F | Resp 16 | Ht 63.0 in | Wt 170.0 lb

## 2023-11-09 DIAGNOSIS — M5441 Lumbago with sciatica, right side: Secondary | ICD-10-CM | POA: Diagnosis present

## 2023-11-09 DIAGNOSIS — M51362 Other intervertebral disc degeneration, lumbar region with discogenic back pain and lower extremity pain: Secondary | ICD-10-CM | POA: Diagnosis present

## 2023-11-09 DIAGNOSIS — M47816 Spondylosis without myelopathy or radiculopathy, lumbar region: Secondary | ICD-10-CM | POA: Diagnosis present

## 2023-11-09 DIAGNOSIS — M79604 Pain in right leg: Secondary | ICD-10-CM | POA: Diagnosis present

## 2023-11-09 DIAGNOSIS — Z79899 Other long term (current) drug therapy: Secondary | ICD-10-CM | POA: Diagnosis present

## 2023-11-09 DIAGNOSIS — Z79891 Long term (current) use of opiate analgesic: Secondary | ICD-10-CM | POA: Diagnosis present

## 2023-11-09 DIAGNOSIS — M5442 Lumbago with sciatica, left side: Secondary | ICD-10-CM | POA: Insufficient documentation

## 2023-11-09 DIAGNOSIS — G894 Chronic pain syndrome: Secondary | ICD-10-CM | POA: Diagnosis present

## 2023-11-09 DIAGNOSIS — Z978 Presence of other specified devices: Secondary | ICD-10-CM | POA: Diagnosis present

## 2023-11-09 DIAGNOSIS — M961 Postlaminectomy syndrome, not elsewhere classified: Secondary | ICD-10-CM | POA: Insufficient documentation

## 2023-11-09 DIAGNOSIS — M5417 Radiculopathy, lumbosacral region: Secondary | ICD-10-CM | POA: Diagnosis present

## 2023-11-09 DIAGNOSIS — M79605 Pain in left leg: Secondary | ICD-10-CM | POA: Diagnosis present

## 2023-11-09 DIAGNOSIS — G8929 Other chronic pain: Secondary | ICD-10-CM | POA: Diagnosis present

## 2023-11-09 DIAGNOSIS — Z9682 Presence of neurostimulator: Secondary | ICD-10-CM | POA: Diagnosis present

## 2023-11-09 NOTE — Progress Notes (Signed)
 Safety precautions to be maintained throughout the outpatient stay will include: orient to surroundings, keep bed in low position, maintain call bell within reach at all times, provide assistance with transfer out of bed and ambulation.

## 2023-11-09 NOTE — Patient Instructions (Signed)
 Patient has Narcan at home and family knows how to use it.    Opioid Overdose Opioids are drugs that are often used to treat pain. Opioids include illegal drugs, such as heroin, as well as prescription pain medicines, such as codeine, morphine, hydrocodone, and fentanyl. An opioid overdose happens when you take too much of an opioid. An overdose may be intentional or accidental and can happen with any type of opioid. The effects of an overdose can be mild, dangerous, or even deadly. Opioid overdose is a medical emergency. What are the causes? This condition may be caused by: Taking too much of an opioid on purpose. Taking too much of an opioid by accident. Using two or more substances that contain opioids at the same time. Taking an opioid with a substance that affects your heart, breathing, or blood pressure. These include alcohol, tranquilizers, sleeping pills, illegal drugs, and some over-the-counter medicines. This condition may also happen due to an error made by: A health care provider who prescribes a medicine. The pharmacist who fills the prescription. What increases the risk? This condition is more likely in: Children. They may be attracted to colorful pills. Because of a child's small size, even a small amount of a medicine can be dangerous. Older people. They may be taking many different medicines. Older people may have difficulty reading labels or remembering when they last took their medicines. They may also be more sensitive to the effects of opioids. People with chronic medical conditions, especially heart, liver, kidney, or neurological diseases. People who take an opioid for a long period of time. People who take opioids and use illegal drugs, such as heroin, or other substances, such as alcohol. People who: Have a history of drug or alcohol abuse. Have certain mental health conditions. Have a history of previous drug overdoses. People who take opioids that are not  prescribed for them. What are the signs or symptoms? Symptoms of this condition depend on the type of opioid and the amount that was taken. Common symptoms include: Sleepiness or difficulty waking from sleep. Confusion. Slurred speech. Slowed breathing and a slow pulse (bradycardia). Nausea and vomiting. Abnormally small pupils. Signs and symptoms that require emergency treatment include: Cold, clammy, and pale skin. Blue lips and fingernails. Vomiting. Gurgling sounds in the throat. A pulse that is very slow or difficult to detect. Breathing that is very irregular, slow, noisy, or difficult to detect. Inability to respond to speech or be awakened from sleep (stupor). Seizures. How is this diagnosed? This condition is diagnosed based on your symptoms and medical history. It is important to tell your health care provider: About all of the opioids that you took. When you took the opioids. Whether you were drinking alcohol or using marijuana, cocaine, or other drugs. Your health care provider will do a physical exam. This exam may include: Checking and monitoring your heart rate and rhythm, breathing rate, temperature, and blood pressure. Measuring oxygen levels in your blood. Checking for abnormally small pupils. You may also have blood tests or urine tests. You may have X-rays if you are having severe breathing problems. How is this treated? This condition requires immediate medical treatment and hospitalization. Reversing the effects of the opioid is the first step in treatment. If you have a Narcan kit or naloxone, use it right away. Follow your health care provider's instructions. A friend or family member can also help you with this. The rest of your treatment will be given in the hospital intensive care (ICU).  Treatment in the hospital may include: Giving salts and minerals (electrolytes) along with fluids through an IV. Inserting a breathing tube (endotracheal tube) in your  airway to help you breathe if you cannot breathe on your own or you are in danger of not being able to breathe on your own. Giving oxygen through a small tube under your nose. Passing a tube through your nose and into your stomach (nasogastric tube, or NG tube) to empty your stomach. Giving medicines that: Increase your blood pressure. Relieve nausea and vomiting. Relieve abdominal pain and cramping. Reverse the effects of the opioid (naloxone). Monitoring your heart and oxygen levels. Ongoing counseling and mental health support if you intentionally overdosed or used an illegal drug. Follow these instructions at home:  Medicines Take over-the-counter and prescription medicines only as told by your health care provider. Always ask your health care provider about possible side effects and interactions of any new medicine that you start taking. Keep a list of all the medicines that you take, including over-the-counter medicines. Bring this list with you to all your medical visits. General instructions Drink enough fluid to keep your urine pale yellow. Keep all follow-up visits. This is important. How is this prevented? Read the drug inserts that come with your opioid pain medicines. Take medicines only as told by your health care provider. Do not take more medicine than you are told. Do not take medicines more frequently than you are told. Do not drink alcohol or take sedatives when taking opioids. Do not use illegal or recreational drugs, including cocaine, ecstasy, and marijuana. Do not take opioid medicines that are not prescribed for you. Store all medicines in safety containers that are out of the reach of children. Get help if you are struggling with: Alcohol or drug use. Depression or another mental health problem. Thoughts of hurting yourself or another person. Keep the phone number of your local poison control center near your phone or in your mobile phone. In the [place] the  hotline of the [facility] is (763) 064-8016. If you were prescribed naloxone, make sure you understand how to take it. Contact a health care provider if: You need help understanding how to take your pain medicines. You feel your medicines are too strong. You are concerned that your pain medicines are not working well for your pain. You develop new symptoms or side effects when you are taking medicines. Get help right away if: You or someone else is having symptoms of an opioid overdose. Get help even if you are not sure. You have thoughts about hurting yourself or others. You have: Chest pain. Difficulty breathing. A loss of consciousness. These symptoms may represent a serious problem that is an emergency. Do not wait to see if the symptoms will go away. Get medical help right away. Call your local emergency services (911 in the [place] Do not drive yourself to the hospital. If you ever feel like you may hurt yourself or others, or have thoughts about taking your own life, get help right away. You can go to your nearest emergency department or: Call your local emergency services (911 in the [place] Call a suicide crisis helpline, such as the National Suicide Prevention Lifeline at (450)005-7256 or 988 in the [place] This is open 24 hours a day in the [place] If you're a Veteran: Call 988 and press 1. This is open 24 hours a day. Text the [employer] at 505-257-6968. Summary Opioids are drugs that are often  used to treat pain. Opioids include illegal drugs, such as heroin, as well as prescription pain medicines. An opioid overdose happens when you take too much of an opioid. Overdoses can be intentional or accidental. Opioid overdose is very dangerous. It is a life-threatening emergency. If you or someone you know is experiencing an opioid overdose, get help right away. This information is not intended to replace advice given to you by your health care provider. Make sure  you discuss any questions you have with your health care provider. Document Revised: 05/24/2023 Document Reviewed: 11/27/2020 Elsevier Patient Education  2024 [employer].

## 2023-11-10 ENCOUNTER — Telehealth: Payer: Self-pay

## 2023-11-10 NOTE — Telephone Encounter (Signed)
Post IT pump refill follow up.  LM ?

## 2023-12-03 ENCOUNTER — Ambulatory Visit: Admitting: Student

## 2024-01-11 NOTE — Progress Notes (Unsigned)
  Electrophysiology Office Note:   Date:  01/12/2024  ID:  [name], DOB Jan 26, 1961, MRN [id number]  Primary Cardiologist: Kendra Herrlich, MD Electrophysiologist: Kendra Pump, MD      History of Present Illness:   Kendra Nelson is a 63 y.o. 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   Since last being seen in our clinic the patient reports doing very well from a cardiac perspective. Overall, she denies chest pain, palpitations, dyspnea, PND, orthopnea, nausea, vomiting, dizziness, syncope, weight gain, or early satiety. Mild peripheral edema at times at the end of the day. States she's very "salt sensitive"  Review of systems complete and found to be negative unless listed in HPI.   EP Information / Studies Reviewed:    EKG is ordered today. Personal review as below.  EKG Interpretation Date/Time:  Wednesday Jan 12 2024 09:59:31 EDT Ventricular Rate:  68 PR Interval:  140 QRS Duration:  76 QT Interval:  418 QTC Calculation: 444 R Axis:   4  Text Interpretation: Normal sinus rhythm Normal ECG When compared with ECG of 11-Jul-2021 10:35, Premature ventricular complexes are no longer Present Non-specific change in ST segment in Inferior leads T wave inversion no longer evident in Inferior leads Confirmed by Pilar Bridge [id number]) on 01/12/2024 10:05:34 AM    Arrhythmia/Device History S/p AF ablation 05/2021   Physical Exam:   VS:  BP 122/86   Pulse 68   Ht 5\' 3"  (1.6 m)   Wt 175 lb 9.6 oz (79.7 kg)   SpO2 97%   BMI 31.11 kg/m    Wt Readings from Last 3 Encounters:  01/12/24 175 lb 9.6 oz (79.7 kg)  11/09/23 170 lb (77.1 kg)  11/02/23 170 lb (77.1 kg)     GEN: No acute distress NECK: No JVD; No carotid bruits CARDIAC: Regular rate and rhythm, no murmurs, rubs, gallops RESPIRATORY:  Clear to auscultation without rales, wheezing or rhonchi   ABDOMEN: Soft, non-tender, non-distended EXTREMITIES:  No edema; No deformity   ASSESSMENT AND PLAN:    Paroxysmal atrial fibrillation Paroxysmal atrial flutter EKG today shows NSR Off of OAC with CHA2DS2VASc of only 2   HTN Stable on current regimen   HLD Continue statin   PVCs EF has remained normal and asymptomatic.  None on EKG today  Follow up with Dr. Lawana Pray in 12 months  Signed, Kendra Galla, PA-C

## 2024-01-12 ENCOUNTER — Ambulatory Visit: Attending: Student | Admitting: Student

## 2024-01-12 ENCOUNTER — Encounter: Payer: Self-pay | Admitting: Student

## 2024-01-12 VITALS — BP 122/86 | HR 68 | Ht 63.0 in | Wt 175.6 lb

## 2024-01-12 DIAGNOSIS — I493 Ventricular premature depolarization: Secondary | ICD-10-CM

## 2024-01-12 DIAGNOSIS — I484 Atypical atrial flutter: Secondary | ICD-10-CM

## 2024-01-12 DIAGNOSIS — I10 Essential (primary) hypertension: Secondary | ICD-10-CM | POA: Diagnosis not present

## 2024-01-12 DIAGNOSIS — E785 Hyperlipidemia, unspecified: Secondary | ICD-10-CM

## 2024-01-12 DIAGNOSIS — I480 Paroxysmal atrial fibrillation: Secondary | ICD-10-CM | POA: Diagnosis not present

## 2024-01-12 NOTE — Patient Instructions (Signed)
 Medication Instructions:  Your physician recommends that you continue on your current medications as directed. Please refer to the Current Medication list given to you today.  *If you need a refill on your cardiac medications before your next appointment, please call your pharmacy*  Lab Work: None ordered If you have labs (blood work) drawn today and your tests are completely normal, you will receive your results only by: MyChart Message (if you have MyChart) OR A paper copy in the mail If you have any lab test that is abnormal or we need to change your treatment, we will call you to review the results.  Follow-Up: At [facility], you and your health needs are our priority.  As part of our continuing mission to provide you with exceptional heart care, our providers are all part of one team.  This team includes your primary Cardiologist (physician) and Advanced Practice Providers or APPs (Physician Assistants and Nurse Practitioners) who all work together to provide you with the care you need, when you need it.  Your next appointment:   1 year(s)  Provider:   Agatha Horsfall, MD or Joycelyn Noa" [place], PA-C

## 2024-01-18 ENCOUNTER — Telehealth: Payer: Self-pay

## 2024-01-18 ENCOUNTER — Other Ambulatory Visit: Payer: Self-pay

## 2024-01-18 MED ORDER — PAIN MANAGEMENT IT PUMP REFILL: 1.0000 | Freq: Once | INTRATHECAL | 0 refills | Status: AC

## 2024-01-18 NOTE — Telephone Encounter (Signed)
 Per Antoine Bathe I moved her pump refill from 6/17 to 6/12 due to Dr. Barth Borne vacation

## 2024-02-07 ENCOUNTER — Telehealth: Payer: Self-pay | Admitting: Pain Medicine

## 2024-02-07 NOTE — Telephone Encounter (Signed)
 Patient will be out of Morphine  tomorrow before her appt on Thursday for pump refill. Want to know if Dr Barth Borne will send 1 script to be picked up. It looks like there was some confusion about med refill dates when patient was last here.

## 2024-02-10 ENCOUNTER — Ambulatory Visit: Attending: Pain Medicine | Admitting: Pain Medicine

## 2024-02-10 ENCOUNTER — Encounter: Payer: Self-pay | Admitting: Pain Medicine

## 2024-02-10 VITALS — BP 146/74 | HR 57 | Temp 97.0°F | Resp 16 | Ht 63.0 in | Wt 170.0 lb

## 2024-02-10 DIAGNOSIS — M47816 Spondylosis without myelopathy or radiculopathy, lumbar region: Secondary | ICD-10-CM | POA: Insufficient documentation

## 2024-02-10 DIAGNOSIS — G894 Chronic pain syndrome: Secondary | ICD-10-CM | POA: Insufficient documentation

## 2024-02-10 DIAGNOSIS — M5441 Lumbago with sciatica, right side: Secondary | ICD-10-CM | POA: Diagnosis present

## 2024-02-10 DIAGNOSIS — Z79891 Long term (current) use of opiate analgesic: Secondary | ICD-10-CM | POA: Diagnosis present

## 2024-02-10 DIAGNOSIS — M961 Postlaminectomy syndrome, not elsewhere classified: Secondary | ICD-10-CM | POA: Insufficient documentation

## 2024-02-10 DIAGNOSIS — M79604 Pain in right leg: Secondary | ICD-10-CM | POA: Diagnosis present

## 2024-02-10 DIAGNOSIS — Z79899 Other long term (current) drug therapy: Secondary | ICD-10-CM | POA: Diagnosis present

## 2024-02-10 DIAGNOSIS — M79605 Pain in left leg: Secondary | ICD-10-CM | POA: Diagnosis present

## 2024-02-10 DIAGNOSIS — M5417 Radiculopathy, lumbosacral region: Secondary | ICD-10-CM | POA: Insufficient documentation

## 2024-02-10 DIAGNOSIS — Z9682 Presence of neurostimulator: Secondary | ICD-10-CM | POA: Insufficient documentation

## 2024-02-10 DIAGNOSIS — Z451 Encounter for adjustment and management of infusion pump: Secondary | ICD-10-CM | POA: Diagnosis present

## 2024-02-10 DIAGNOSIS — G8929 Other chronic pain: Secondary | ICD-10-CM | POA: Diagnosis present

## 2024-02-10 DIAGNOSIS — M5442 Lumbago with sciatica, left side: Secondary | ICD-10-CM | POA: Insufficient documentation

## 2024-02-10 DIAGNOSIS — M51362 Other intervertebral disc degeneration, lumbar region with discogenic back pain and lower extremity pain: Secondary | ICD-10-CM | POA: Diagnosis present

## 2024-02-10 DIAGNOSIS — Z978 Presence of other specified devices: Secondary | ICD-10-CM | POA: Diagnosis present

## 2024-02-10 MED ORDER — MORPHINE SULFATE ER 30 MG PO TBCR: 30.0000 mg | EXTENDED_RELEASE_TABLET | Freq: Two times a day (BID) | ORAL | 0 refills | Status: DC

## 2024-02-10 NOTE — Patient Instructions (Addendum)
 Patient has Narcan  and husband knows how to use it.  _________________________________________________________    Opioid Pain Medication Update  To: All patients taking opioid pain medications. (I.e.: hydrocodone , hydromorphone , oxycodone , oxymorphone, morphine , codeine, methadone, tapentadol, tramadol, buprenorphine, fentanyl , etc.)  Re: Updated review of side effects and adverse reactions of opioid analgesics, as well as new information about long term effects of this class of medications.  Direct risks of long-term opioid therapy are not limited to opioid addiction and overdose. Potential medical risks include serious fractures, breathing problems during sleep, hyperalgesia, immunosuppression, chronic constipation, bowel obstruction, myocardial infarction, and tooth decay secondary to xerostomia.  Unpredictable adverse effects that can occur even if you take your medication correctly: Cognitive impairment, respiratory depression, and death. Most people think that if they take their medication correctly, and as instructed, that they will be safe. Nothing could be farther from the truth. In reality, a significant amount of recorded deaths associated with the use of opioids has occurred in individuals that had taken the medication for a long time, and were taking their medication correctly. The following are examples of how this can happen: Patient taking his/her medication for a long time, as instructed, without any side effects, is given a certain antibiotic or another unrelated medication, which in turn triggers a Drug-to-drug interaction leading to disorientation, cognitive impairment, impaired reflexes, respiratory depression or an untoward event leading to serious bodily harm or injury, including death.  Patient taking his/her medication for a long time, as instructed, without any side effects, develops an acute impairment of liver and/or kidney function. This will lead to a rapid inability  of the body to breakdown and eliminate their pain medication, which will result in effects similar to an overdose, but with the same medicine and dose that they had always taken. This again may lead to disorientation, cognitive impairment, impaired reflexes, respiratory depression or an untoward event leading to serious bodily harm or injury, including death.  A similar problem will occur with patients as they grow older and their liver and kidney function begins to decrease as part of the aging process.  Background information: Historically, the original case for using long-term opioid therapy to treat chronic noncancer pain was based on safety assumptions that subsequent experience has called into question. In 1996, the American Pain Society and the American Academy of Pain Medicine issued a consensus statement supporting long-term opioid therapy. This statement acknowledged the dangers of opioid prescribing but concluded that the risk for addiction was low; respiratory depression induced by opioids was short-lived, occurred mainly in opioid-naive patients, and was antagonized by pain; tolerance was not a common problem; and efforts to control diversion should not constrain opioid prescribing. This has now proven to be wrong. Experience regarding the risks for opioid addiction, misuse, and overdose in community practice has failed to support these assumptions.  According to the Centers for Disease Control and Prevention, fatal overdoses involving opioid analgesics have increased sharply over the past decade. Currently, more than 96,700 people die from drug overdoses every year. Opioids are a factor in 7 out of every 10 overdose deaths. Deaths from drug overdose have surpassed motor vehicle accidents as the leading cause of death for individuals between the ages of 56 and 56.  Clinical data suggest that neuroendocrine dysfunction may be very common in both men and women, potentially causing hypogonadism,  erectile dysfunction, infertility, decreased libido, osteoporosis, and depression. Recent studies linked higher opioid dose to increased opioid-related mortality. Controlled observational studies reported that long-term opioid  therapy may be associated with increased risk for cardiovascular events. Subsequent meta-analysis concluded that the safety of long-term opioid therapy in elderly patients has not been proven.   Side Effects and adverse reactions: Common side effects: Drowsiness (sedation). Dizziness. Nausea and vomiting. Constipation. Physical dependence -- Dependence often manifests with withdrawal symptoms when opioids are discontinued or decreased. Tolerance -- As you take repeated doses of opioids, you require increased medication to experience the same effect of pain relief. Respiratory depression -- This can occur in healthy people, especially with higher doses. However, people with COPD, asthma or other lung conditions may be even more susceptible to fatal respiratory impairment.  Uncommon side effects: An increased sensitivity to feeling pain and extreme response to pain (hyperalgesia). Chronic use of opioids can lead to this. Delayed gastric emptying (the process by which the contents of your stomach are moved into your small intestine). Muscle rigidity. Immune system and hormonal dysfunction. Quick, involuntary muscle jerks (myoclonus). Arrhythmia. Itchy skin (pruritus). Dry mouth (xerostomia).  Long-term side effects: Chronic constipation. Sleep-disordered breathing (SDB). Increased risk of bone fractures. Hypothalamic-pituitary-adrenal dysregulation. Increased risk of overdose.  RISKS: Respiratory depression and death: Opioids increase the risk of respiratory depression and death.  Drug-to-drug interactions: Opioids are relatively contraindicated in combination with benzodiazepines, sleep inducers, and other central nervous system depressants. Other classes of  medications (i.e.: certain antibiotics and even over-the-counter medications) may also trigger or induce respiratory depression in some patients.  Medical conditions: Patients with pre-existing respiratory problems are at higher risk of respiratory failure and/or depression when in combination with opioid analgesics. Opioids are relatively contraindicated in some medical conditions such as central sleep apnea.   Fractures and Falls:  Opioids increase the risk and incidence of falls. This is of particular importance in elderly patients.  Endocrine System:  Long-term administration is associated with endocrine abnormalities (endocrinopathies). (Also known as Opioid-induced Endocrinopathy) Influences on both the hypothalamic-pituitary-adrenal axis?and the hypothalamic-pituitary-gonadal axis have been demonstrated with consequent hypogonadism and adrenal insufficiency in both sexes. Hypogonadism and decreased levels of dehydroepiandrosterone sulfate have been reported in men and women. Endocrine effects include: Amenorrhoea in women (abnormal absence of menstruation) Reduced libido in both sexes Decreased sexual function Erectile dysfunction in men Hypogonadisms (decreased testicular function with shrinkage of testicles) Infertility Depression and fatigue Loss of muscle mass Anxiety Depression Immune suppression Hyperalgesia Weight gain Anemia Osteoporosis Patients (particularly women of childbearing age) should avoid opioids. There is insufficient evidence to recommend routine monitoring of asymptomatic patients taking opioids in the long-term for hormonal deficiencies.  Immune System: Human studies have demonstrated that opioids have an immunomodulating effect. These effects are mediated via opioid receptors both on immune effector cells and in the central nervous system. Opioids have been demonstrated to have adverse effects on antimicrobial response and anti-tumour  surveillance. Buprenorphine has been demonstrated to have no impact on immune function.  Opioid Induced Hyperalgesia: Human studies have demonstrated that prolonged use of opioids can lead to a state of abnormal pain sensitivity, sometimes called opioid induced hyperalgesia (OIH). Opioid induced hyperalgesia is not usually seen in the absence of tolerance to opioid analgesia. Clinically, hyperalgesia may be diagnosed if the patient on long-term opioid therapy presents with increased pain. This might be qualitatively and anatomically distinct from pain related to disease progression or to breakthrough pain resulting from development of opioid tolerance. Pain associated with hyperalgesia tends to be more diffuse than the pre-existing pain and less defined in quality. Management of opioid induced hyperalgesia requires opioid dose reduction.  Cancer: Chronic opioid therapy has been associated with an increased risk of cancer among noncancer patients with chronic pain. This association was more evident in chronic strong opioid users. Chronic opioid consumption causes significant pathological changes in the small intestine and colon. Epidemiological studies have found that there is a link between opium  dependence and initiation of gastrointestinal cancers. Cancer is the second leading cause of death after cardiovascular disease. Chronic use of opioids can cause multiple conditions such as GERD, immunosuppression and renal damage as well as carcinogenic effects, which are associated with the incidence of cancers.   Mortality: Long-term opioid use has been associated with increased mortality among patients with chronic non-cancer pain (CNCP).  Prescription of long-acting opioids for chronic noncancer pain was associated with a significantly increased risk of all-cause mortality, including deaths from causes other than overdose.  Reference: Von Korff M, Kolodny A, Deyo RA, Chou R. Long-term opioid therapy  reconsidered. Ann Intern Med. 2011 Sep 6;155(5):325-8. doi: 10.7326/[id number]. PMID: 09811914; PMCID: [id number]. Achilles Achilles, Hayward RA, Dunn KM, [place] KP. Risk of adverse events in patients prescribed long-term opioids: A cohort study in the [place] Clinical Practice Research Datalink. Eur J Pain. 2019 May;23(5):908-922. doi: 10.1002/ejp.1357. Epub 2019 Jan 31. PMID: 86578469. Colameco S, Coren JS, Ciervo CA. Continuous opioid treatment for chronic noncancer pain: a time for moderation in prescribing. Postgrad Med. 2009 Jul;121(4):61-6. doi: 10.3810/pgm.2009.07.2032. PMID: 62952841. Orlan Bis RN, [place] SD, Blazina I, Sheliah Deutscher, Bougatsos C, Deyo RA. The effectiveness and risks of long-term opioid therapy for chronic pain: a systematic review for a [employer] of [employer] Pathways to [employer]. Ann Intern Med. 2015 Feb 17;162(4):276-86. doi: 10.7326/M14-2559. PMID: 32440102. [employer] [facility], Makuc DM. NCHS Data Brief No. 22. [place]: Centers for Disease Control and Prevention; 2009. Sep, Increase in Fatal Poisonings Involving Opioid Analgesics in the [place] , 1999-2006. Song IA, Choi HR, Oh TK. Long-term opioid use and mortality in patients with chronic non-cancer pain: Ten-year follow-up study in [place] from 2010 through 2019. EClinicalMedicine. 2022 Jul 18;51:101558. doi: 10.1016/j.eclinm.2022.725366. PMID: 44034742; PMCID: [id number]. Huser, W., Schubert, T., Vogelmann, T. et al. All-cause mortality in patients with long-term opioid therapy compared with non-opioid analgesics for chronic non-cancer pain: a database study. BMC Med 18, 162 (2020). http://lester.info/ Rashidian H, Zendehdel K, Kamangar F, Malekzadeh R, Haghdoost AA. An Ecological Study of the Association between Opiate Use and Incidence of Cancers. Addict Health. 2016 Fall;8(4):252-260. PMID: 33295188; PMCID: [id number].  Our  Goal: Our goal is to control your pain with means other than the use of opioid pain medications.  Our Recommendation: Talk to your physician about coming off of these medications. We can assist you with the tapering down and stopping these medicines. Based on the new information, even if you cannot completely stop the medication, a decrease in the dose may be associated with a lesser risk. Ask for other means of controlling the pain. Decrease or eliminate those factors that significantly contribute to your pain such as smoking, obesity, and a diet heavily tilted towards inflammatory nutrients.  Last Updated: 03/08/2023   ______________________________________________________________________       ______________________________________________________________________    National Pain Medication Shortage  The [place] is experiencing worsening drug shortages. These have had a negative widespread effect on patient care and treatment. Not expected to improve any time soon. Predicted to last past 2029.   Drug shortage list (generic names) Oxycodone  IR Oxycodone /APAP Oxymorphone IR Hydromorphone  Hydrocodone /APAP Morphine   Where is  the problem?  Manufacturing and supply level.  Will this shortage affect you?  Only if you take any of the above pain medications.  How? You may be unable to fill your prescription.  Your pharmacist may offer a partial fill of your prescription. (Warning: Do not accept partial fills.) Prescriptions partially filled cannot be transferred to another pharmacy. Read our Medication Rules and Regulation. Depending on how much medicine you are dependent on, you may experience withdrawals when unable to get the medication.  Recommendations: Consider ending your dependence on opioid pain medications. Ask your pain specialist to assist you with the process. Consider switching to a medication currently not in shortage, such as Buprenorphine. Talk to your pain  specialist about this option. Consider decreasing your pain medication requirements by managing tolerance thru Drug Holidays. This may help minimize withdrawals, should you run out of medicine. Control your pain thru the use of non-pharmacological interventional therapies.   Your prescriber: Prescribers cannot be blamed for shortages. Medication manufacturing and supply issues cannot be fixed by the prescriber.   NOTE: The prescriber is not responsible for supplying the medication, or solving supply issues. Work with your pharmacist to solve it. The patient is responsible for the decision to take or continue taking the medication and for identifying and securing a legal supply source. By law, supplying the medication is the job and responsibility of the pharmacy. The prescriber is responsible for the evaluation, monitoring, and prescribing of these medications.   Prescribers will NOT: Re-issue prescriptions that have been partially filled. Re-issue prescriptions already sent to a pharmacy.  Re-send prescriptions to a different pharmacy because yours did not have your medication. Ask pharmacist to order more medicine or transfer the prescription to another pharmacy. (Read below.)  New 2023 regulation: May 01, 2022 Revised Regulation Allows DEA-Registered Pharmacies to Transfer Electronic Prescriptions at a Patient's Request DEA Headquarters Division - Public Information Office Patients now have the ability to request their electronic prescription be transferred to another pharmacy without having to go back to their practitioner to initiate the request. This revised regulation went into effect on Monday, April 27, 2022.     At a patient's request, a DEA-registered retail pharmacy can now transfer an electronic prescription for a controlled substance (schedules II-V) to another DEA-registered retail pharmacy. Prior to this change, patients would have to go through their practitioner to  cancel their prescription and have it re-issued to a different pharmacy. The process was taxing and time consuming for both patients and practitioners.    The Drug Enforcement Administration [facility]) published its intent to revise the process for transferring electronic prescriptions on July 19, 2020.  The final rule was published in the federal register on March 26, 2022 and went into effect 30 days later.  Under the final rule, a prescription can only be transferred once between pharmacies, and only if allowed under existing state or other applicable law. The prescription must remain in its electronic form; may not be altered in any way; and the transfer must be communicated directly between two licensed pharmacists. It's important to note, any authorized refills transfer with the original prescription, which means the entire prescription will be filled at the same pharmacy.  Reference: [email or web address] [facility] website announcement)  [email or web address] [employer] of Justice)   [employer] / Vol. 88, No. 143 / Thursday, March 26, 2022 / Rules and Regulations DEPARTMENT OF JUSTICE  Drug Enforcement Administration  21 CFR Part 1306  [Docket No. [id number]]  RIN  [id number] Transfer of Electronic Prescriptions for Schedules II-V Controlled Substances Between Pharmacies for Initial Filling  ______________________________________________________________________       ______________________________________________________________________    Transfer of Pain Medication between Pharmacies  Re: 2023 DEA Clarification on existing regulation  Published on DEA Website: May 01, 2022  Title: Revised Regulation Allows DEA-Registered Pharmacies to Electrical engineer Prescriptions at a Patient's Request DEA Headquarters Division -  [employer]  Patients now have the ability to request their electronic prescription be transferred to another pharmacy without having to go back to their practitioner to initiate the request. This revised regulation went into effect on Monday, April 27, 2022.     At a patient's request, a DEA-registered retail pharmacy can now transfer an electronic prescription for a controlled substance (schedules II-V) to another DEA-registered retail pharmacy. Prior to this change, patients would have to go through their practitioner to cancel their prescription and have it re-issued to a different pharmacy. The process was taxing and time consuming for both patients and practitioners.    The Drug Enforcement Administration [facility]) published its intent to revise the process for transferring electronic prescriptions on July 19, 2020.  The final rule was published in the federal register on March 26, 2022 and went into effect 30 days later.  Under the final rule, a prescription can only be transferred once between pharmacies, and only if allowed under existing state or other applicable law. The prescription must remain in its electronic form; may not be altered in any way; and the transfer must be communicated directly between two licensed pharmacists. It's important to note, any authorized refills transfer with the original prescription, which means the entire prescription will be filled at the same pharmacy.    REFERENCES: 1. DEA website announcement [email or web address]  2. Department of Justice website  [email or web address]  3. DEPARTMENT OF JUSTICE Drug Enforcement Administration 21 CFR Part 1306 [Docket No. [id number]] RIN [id number] Transfer of Electronic Prescriptions for Schedules II-V Controlled Substances Between Pharmacies for Initial  Filling  ______________________________________________________________________       ______________________________________________________________________    Medication Rules  Purpose: To inform patients, and their family members, of our medication rules and regulations.  Applies to: All patients receiving prescriptions from our practice (written or electronic).  Pharmacy of record: This is the pharmacy where your electronic prescriptions will be sent. Make sure we have the correct one.  Electronic prescriptions: In compliance with the [place]  Strengthen Opioid Misuse Prevention (STOP) Act of 2017 (Session Law 2017-74/H243), effective August 31, 2018, all controlled substances must be electronically prescribed. Written prescriptions, faxing, or calling prescriptions to a pharmacy will no longer be done.  Prescription refills: These will be provided only during in-person appointments. No medications will be renewed without a face-to-face evaluation with your provider. Applies to all prescriptions.  NOTE: The following applies primarily to controlled substances (Opioid* Pain Medications).   Type of encounter (visit): For patients receiving controlled substances, face-to-face visits are required. (Not an option and not up to the patient.)  Patient's Responsibilities: Pain Pills: Bring all pain pills to every appointment (except for procedure appointments). Pill counts are required.  Pill Bottles: Bring pills in original pharmacy bottle. Bring bottle, even if empty. Always bring the bottle of the most recent fill.  Medication refills: You are responsible for knowing and keeping track of what medications you are taking and when is it that you will need a refill. The day before your appointment: write a list of all prescriptions that need to  be refilled. The day of the appointment: give the list to the admitting nurse. Prescriptions will be written only during appointments. No  prescriptions will be written on procedure days. If you forget a medication: it will not be Called in, Faxed, or electronically sent. You will need to get another appointment to get these prescribed. No early refills. Do not call asking to have your prescription filled early. Partial  or short prescriptions: Occasionally your pharmacy may not have enough pills to fill your prescription.  NEVER ACCEPT a partial fill or a prescription that is short of the total amount of pills that you were prescribed.  With controlled substances the law allows 72 hours for the pharmacy to complete the prescription.  If the prescription is not completed within 72 hours, the pharmacist will require a new prescription to be written. This means that you will be short on your medicine and we WILL NOT send another prescription to complete your original prescription.  Instead, request the pharmacy to send a carrier to a nearby branch to get enough medication to provide you with your full prescription. Prescription Accuracy: You are responsible for carefully inspecting your prescriptions before leaving our office. Have the discharge nurse carefully go over each prescription with you, before taking them home. Make sure that your name is accurately spelled, that your address is correct. Check the name and dose of your medication to make sure it is accurate. Check the number of pills, and the written instructions to make sure they are clear and accurate. Make sure that you are given enough medication to last until your next medication refill appointment. Taking Medication: Take medication as prescribed. When it comes to controlled substances, taking less pills or less frequently than prescribed is permitted and encouraged. Never take more pills than instructed. Never take the medication more frequently than prescribed.  Inform other Doctors: Always inform, all of your healthcare providers, of all the medications you take. Pain  Medication from other Providers: You are not allowed to accept any additional pain medication from any other Doctor or Healthcare provider. There are two exceptions to this rule. (see below) In the event that you require additional pain medication, you are responsible for notifying us , as stated below. Cough Medicine: Often these contain an opioid, such as codeine or hydrocodone . Never accept or take cough medicine containing these opioids if you are already taking an opioid* medication. The combination may cause respiratory failure and death. Medication Agreement: You are responsible for carefully reading and following our Medication Agreement. This must be signed before receiving any prescriptions from our practice. Safely store a copy of your signed Agreement. Violations to the Agreement will result in no further prescriptions. (Additional copies of our Medication Agreement are available upon request.) Laws, Rules, & Regulations: All patients are expected to follow all [address] and [employer], Rules, [employer]. Ignorance of the Laws does not constitute a valid excuse.  Illegal drugs and Controlled Substances: The use of illegal substances (including, but not limited to marijuana and its derivatives) and/or the illegal use of any controlled substances is strictly prohibited. Violation of this rule may result in the immediate and permanent discontinuation of any and all prescriptions being written by our practice. The use of any illegal substances is prohibited. Adopted CDC guidelines & recommendations: Target dosing levels will be at or below 60 MME/day. Use of benzodiazepines** is not recommended. Urine Drug testing: Patients taking controlled substances will be required to provide a urine sample upon  request. Do not void before coming to your medication management appointments. Hold emptying your bladder until you are admitted. The admitting nurse will inform you if a sample is required. Our  practice reserves the right to call you at any time to provide a sample. Once receiving the call, you have 24 hours to comply with request. Not providing a sample upon request may result in termination of medication therapy.  Exceptions: There are only two exceptions to the rule of not receiving pain medications from other Healthcare Providers. Exception #1 (Emergencies): In the event of an emergency (i.e.: accident requiring emergency care), you are allowed to receive additional pain medication. However, you are responsible for: As soon as you are able, call our office [phone], at any time of the day or night, and leave a message stating your name, the date and nature of the emergency, and the name and dose of the medication prescribed. In the event that your call is answered by a member of our staff, make sure to document and save the date, time, and the name of the person that took your information.  Exception #2 (Planned Surgery): In the event that you are scheduled by another doctor or dentist to have any type of surgery or procedure, you are allowed (for a period no longer than 30 days), to receive additional pain medication, for the acute post-op pain. However, in this case, you are responsible for picking up a copy of our Post-op Pain Management for Surgeons handout, and giving it to your surgeon or dentist. This document is available at our office, and does not require an appointment to obtain it. Simply go to our office during business hours (Monday-Thursday from 8:00 AM to 4:00 PM) (Friday 8:00 AM to 12:00 Noon) or if you have a scheduled appointment with us , prior to your surgery, and ask for it by name. In addition, you are responsible for: calling our office [phone], at any time of the day or night, and leaving a message stating your name, name of your surgeon, type of surgery, and date of procedure or surgery. Failure to comply with your responsibilities may result in termination  of therapy involving the controlled substances.  Consequences:  Non-compliance with the above rules may result in permanent discontinuation of medication prescription therapy. All patients receiving any type of controlled substance is expected to comply with the above patient responsibilities. Not doing so may result in permanent discontinuation of medication prescription therapy. Medication Agreement Violation. Following the above rules, including your responsibilities will help you in avoiding a Medication Agreement Violation ("Breaking your Pain Medication Contract").  *Opioid medications include: morphine , codeine, oxycodone , oxymorphone, hydrocodone , hydromorphone , meperidine , tramadol, tapentadol, buprenorphine, fentanyl , methadone. **Benzodiazepine medications include: diazepam  (Valium ), alprazolam (Xanax), clonazepam (Klonopine), lorazepam (Ativan), clorazepate (Tranxene), chlordiazepoxide (Librium), estazolam (Prosom), oxazepam (Serax), temazepam (Restoril), triazolam (Halcion) (Last updated: 06/23/2023) ______________________________________________________________________      ______________________________________________________________________    Medication Recommendations and Reminders  Applies to: All patients receiving prescriptions (written and/or electronic).  Medication Rules & Regulations: You are responsible for reading, knowing, and following our Medication Rules document. These exist for your safety and that of others. They are not flexible and neither are we. Dismissing or ignoring them is an act of non-compliance that may result in complete and irreversible termination of such medication therapy. For safety reasons, non-compliance will not be tolerated. As with the [place] fundamental legal principle of ignorance of the law is no defense, we will accept no excuses for not having  read and knowing the content of documents provided to you by our practice.  Pharmacy of  record:  Definition: This is the pharmacy where your electronic prescriptions will be sent.  We do not endorse any particular pharmacy. It is up to you and your insurance to decide what pharmacy to use.  We do not restrict you in your choice of pharmacy. However, once we write for your prescriptions, we will NOT be re-sending more prescriptions to fix restricted supply problems created by your pharmacy, or your insurance.  The pharmacy listed in the electronic medical record should be the one where you want electronic prescriptions to be sent. If you choose to change pharmacy, simply notify our nursing staff. Changes will be made only during your regular appointments and not over the phone.  Recommendations: Keep all of your pain medications in a safe place, under lock and key, even if you live alone. We will NOT replace lost, stolen, or damaged medication. We do not accept Police Reports as proof of medications having been stolen. After you fill your prescription, take 1 week's worth of pills and put them away in a safe place. You should keep a separate, properly labeled bottle for this purpose. The remainder should be kept in the original bottle. Use this as your primary supply, until it runs out. Once it's gone, then you know that you have 1 week's worth of medicine, and it is time to come in for a prescription refill. If you do this correctly, it is unlikely that you will ever run out of medicine. To make sure that the above recommendation works, it is very important that you make sure your medication refill appointments are scheduled at least 1 week before you run out of medicine. To do this in an effective manner, make sure that you do not leave the office without scheduling your next medication management appointment. Always ask the nursing staff to show you in your prescription , when your medication will be running out. Then arrange for the receptionist to get you a return appointment, at least  7 days before you run out of medicine. Do not wait until you have 1 or 2 pills left, to come in. This is very poor planning and does not take into consideration that we may need to cancel appointments due to bad weather, sickness, or emergencies affecting our staff. DO NOT ACCEPT A Partial Fill: If for any reason your pharmacy does not have enough pills/tablets to completely fill or refill your prescription, do not allow for a partial fill. The law allows the pharmacy to complete that prescription within 72 hours, without requiring a new prescription. If they do not fill the rest of your prescription within those 72 hours, you will need a separate prescription to fill the remaining amount, which we will NOT provide. If the reason for the partial fill is your insurance, you will need to talk to the pharmacist about payment alternatives for the remaining tablets, but again, DO NOT ACCEPT A PARTIAL FILL, unless you can trust your pharmacist to obtain the remainder of the pills within 72 hours.  Prescription refills and/or changes in medication(s):  Prescription refills, and/or changes in dose or medication, will be conducted only during scheduled medication management appointments. (Applies to both, written and electronic prescriptions.) No refills on procedure days. No medication will be changed or started on procedure days. No changes, adjustments, and/or refills will be conducted on a procedure day. Doing so will interfere with the diagnostic  portion of the procedure. No phone refills. No medications will be called into the pharmacy. No Fax refills. No weekend refills. No Holliday refills. No after hours refills.  Remember:  Business hours are:  Monday to Thursday 8:00 AM to 4:00 PM Provider's Schedule: Renaldo Caroli, MD - Appointments are:  Medication management: Monday and Wednesday 8:00 AM to 4:00 PM Procedure day: Tuesday and Thursday 7:30 AM to 4:00 PM Cephus Collin, MD -  Appointments are:  Medication management: Tuesday and Thursday 8:00 AM to 4:00 PM Procedure day: Monday and Wednesday 7:30 AM to 4:00 PM (Last update: 06/23/2022) ______________________________________________________________________      ______________________________________________________________________    Drug Holidays  What is a Drug Holiday? Drug Holiday: is the name given to the process of slowly tapering down and temporarily stopping the pain medication for the purpose of decreasing or eliminating tolerance to the drug.  Benefits Improved effectiveness Decreased required effective dose Improved pain control End dependence on high dose therapy Decrease cost of therapy Uncovering opioid-induced hyperalgesia. (OIH)  What is opioid hyperalgesia? It is a paradoxical increase in pain caused by exposure to opioids. Stopping the opioid pain medication, contrary to the expected, it actually decreases or completely eliminates the pain. Ref.: A comprehensive review of opioid-induced hyperalgesia. Louana Roup, et.al. Pain Physician. 2011 Mar-Apr;14(2):145-61.  What is tolerance? Tolerance: the progressive loss of effectiveness of a pain medicine due to repetitive use. A common problem of opioid pain medications.  How long should a Drug Holiday last? Effectiveness depends on the patient staying off all opioid pain medicines for a minimum of 14 consecutive days. (2 weeks) During this time the patient should not be taking any other opioid analgesic medication.  How about just taking less of the medicine? Does not work. Will not accomplish goal of eliminating the excess receptors.  How about switching to a different pain medicine? (AKA. Opioid rotation) Does not work. Creates the illusion of effectiveness by taking advantage of inaccurate equivalent dose calculations between different opioids. -This technique was promoted by studies funded by [employer], such  as [employer], creators of OxyContin .  Can I stop the medicine cold [place]? We do not recommend it. You should always coordinate with your prescribing physician to make the transition as smoothly as possible. Avoid stopping the medicine abruptly without consulting. We recommend a slow taper.  What is a slow taper? Taper: refers to the gradual decrease in dose.   How do I stop/taper the dose? Slowly. Decrease the daily amount of pills that you take by one (1) pill every seven (7) days. This is called a slow downward taper. Example: if you normally take four (4) pills per day, drop it to three (3) pills per day for seven (7) days, then to two (2) pills per day for seven (7) days, then to one (1) per day for seven (7) days, and then stop the medicine. The 14 day Drug Holiday starts on the first day without medicine.   Will I experience withdrawals? Unlikely with a slow taper.  What triggers withdrawals? Withdrawals are triggered by the sudden/abrupt stop of high dose opioids. Withdrawals can be avoided by slowly decreasing the dose over a prolonged period of time.  What are withdrawals? Symptoms associated with sudden/abrupt reduction/stopping of high-dose, long-term use of pain medication. Withdrawal are seldom seen on low dose therapy, or patients rarely taking opioid medication.  Early Withdrawal Symptoms may include: Agitation Anxiety Muscle aches Increased tearing Insomnia Runny nose Sweating Yawning  Late symptoms  may include: Abdominal cramping Diarrhea Dilated pupils Goose bumps Nausea Vomiting  When could I see withdrawals? Onset: 8-24 hours after last use for most opioids. 12-48 hours for long-acting opioids (i.e.: methadone)  How long could they last? Duration: 4-10 days for most opioids. 14-21 days for long-acting opioids (i.e.: methadone)  What will happen after I complete my Drug Holiday? The need and indications for the opioid analgesic will be  reviewed before restarting the medication. Dose requirements will likely decrease and the dose will need to be adjusted accordingly.   (Last update: 06/23/2023) ______________________________________________________________________

## 2024-02-10 NOTE — Progress Notes (Signed)
 PROVIDER NOTE: Interpretation of information contained herein should be left to medically-trained personnel. Specific patient instructions are provided elsewhere under Patient Instructions section of medical record. This document was created in part using STT-dictation technology, any transcriptional errors that may result from this process are unintentional.  Patient: Kendra Nelson Type: Established DOB: 13-Oct-1960 MRN: [id number] PCP: Angelia Kelp, NP  Service: Procedure DOS: 02/10/2024 Setting: Ambulatory Location: Ambulatory outpatient facility Delivery: Face-to-face Provider: Candi Chafe, MD Specialty: Interventional Pain Management Specialty designation: 09 Location: Outpatient facility Ref. Prov.: Angelia Kelp, NP       Interventional Therapy   Primary Reason for Visit: Interventional Pain Management Treatment. CC: Back Pain (lower)  Procedure:          Type: Management of Intrathecal Drug Delivery System (IDDS) - Reservoir Refill (40981). No rate change.  Indications: 1. Chronic pain syndrome   2. Chronic low back pain (1ry area of Pain) (Bilateral) (R>L) w/ sciatica (Bilateral)   3. Chronic lower extremity pain (2ry area of Pain) (Bilateral) (R>L)   4. 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 Lumbar facet syndrome (Bilateral) (R>L)   6. Chronic lumbosacral radiculopathy (L5) (Left)   7. Degeneration of intervertebral disc of lumbar region with discogenic back pain and lower extremity pain   8. Presence of neurostimulator   9. Presence of intrathecal pump   10. Pharmacologic therapy   11. Long term prescription opiate use   12. Encounter for medication management   13. Encounter for adjustment and management of infusion pump   14. Chronic use of opiate for therapeutic purpose   15. Encounter for chronic pain management    Pain Assessment: Self-Reported Pain Score: 3 /10             Reported level is compatible with  observation.           Intrathecal Drug Delivery System (IDDS)  Pump Device:  Manufacturer: Medtronic Model: Synchromed II Model No.: [id number] Serial No.: [id number] Delivery Route: Intrathecal Type: Programmable  Volume (mL): 40 mL reservoir Priming Volume: n/a  Calibration Constant: 120.0  MRI compatibility: Conditional   Implant Details:  Date: 05/09/2019 Implanter: Gerri Kras, MD  Contact Information: [facility] Last Revision/Replacement: 05/09/2019 Estimated Replacement Date: May/2027  Implant Site: Abdominal Laterality: Left  Catheter: Manufacturer: Medtronic Model:  (two piece) Model No.: [id number]  Serial No.: n/a  Implanted Length (cm): 38.1  Catheter Volume (mL): 0.214  Tip Location (Level): T6-7 Canal Access Site: n/a  Drug content:  Primary Medication Class: Opioid  Medication: PF-Fentanyl   Concentration: 1,000 mcg/mL   Secondary Medication Class: Local Anesthetic  Medication: PF-Bupivacaine   Concentration: 20.0 mg/mL   Tertiary Medication Class: none   PA parameters (PCA-mode):  Mode: Off (Inactive)  Programming:  Type: Simple continuous.  Medication, Concentration, Infusion Program, & Delivery Rate: For up-to-date details please see most recent scanned programming printout.   Changes:  Medication Change: None at this point Rate Change: No change in rate  Reported side-effects or adverse reactions: None reported  Effectiveness: Described as relatively effective, allowing for increase in activities of daily living (ADL) Clinically meaningful improvement in function (CMIF): Sustained CMIF goals met  Plan: Pump refill today   Pharmacotherapy Assessment Opioid Analgesic: Morphine  (MS Contin ) 30 mg PO BID (60 mg/day of morphine ) (60 MME/day) +  PF-(intrathecal)-Fentanyl  (13.4 mcg/hr)  changed from Oxycodone  IR 10 mg 4x/day (40 mg/day of oxycodone ) (60 MME/day), due to national shortage MME/day: 60  MME/day (oral) + 32.16  MME/day (intrathecal) = 93.16 MME/day (Aprox. 1.16 MME/day/kg).   Monitoring: [place] PMP: PDMP reviewed during this encounter.       Pharmacotherapy: No side-effects or adverse reactions reported. Compliance: No problems identified. Effectiveness: Clinically acceptable. Plan: Refer to POC.  UDS:  Summary  Date Value Ref Range Status  02/17/2023 Note  Final    Comment:    ==================================================================== [employer] Select 13 (MW) ==================================================================== Test                             Result       Flag       Units  Drug Present and Declared for Prescription Verification   Morphine                        >6849        EXPECTED   ng/mg creat   Normorphine                    440          EXPECTED   ng/mg creat    Potential sources of large amounts of morphine  in the absence of    codeine include administration of morphine  or use of heroin.     Normorphine is an expected metabolite of morphine .    Hydromorphone                   71           EXPECTED   ng/mg creat    Hydromorphone  may be present as a metabolite of morphine ;    concentrations of hydromorphone  rarely exceed 5% of the morphine     concentration when this is the source of hydromorphone .    Fentanyl                        27           EXPECTED   ng/mg creat   Norfentanyl                    95           EXPECTED   ng/mg creat    Source of fentanyl  is a scheduled prescription medication, including    IV, patch, and transmucosal formulations. Norfentanyl is an expected    metabolite of fentanyl .  ==================================================================== Test                      Result    Flag   Units      Ref Range   Creatinine              146              mg/dL      >=91 ==================================================================== Declared Medications:  The flagging and interpretation on this report are based on the   following declared medications.  Unexpected results may arise from  inaccuracies in the declared medications.   **Note: The testing scope of this panel includes these medications:   Fentanyl   Morphine  (MS Contin )   **Note: The testing scope of this panel does not include the  following reported medications:   Atorvastatin  (Lipitor)  Bupivacaine   Carvedilol  (Coreg )  Furosemide  (Lasix )  Lisinopril  (Zestril )  Naloxone  (Narcan )  Vitamin D2 ==================================================================== For clinical consultation, please call [phone]. ====================================================================    No results  found for: CBDTHCR, D8THCCBX, D9THCCBX  H&P (Pre-op Assessment):  Ms. Zenz is a 63 y.o. (year old), female patient, seen today for interventional treatment. 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 Ms. Ehler has a current medication list which includes the following prescription(s): AMBULATORY NON FORMULARY MEDICATION, atorvastatin , carvedilol , furosemide , lisinopril , naloxone , morphine , [START ON 03/11/2024] morphine , and [START ON 04/10/2024] morphine . Her primarily concern today is the Back Pain (lower)  Initial Vital Signs:  Pulse/HCG Rate: (!) 57  Temp: (!) 97 F (36.1 C) Resp: 16 BP: (!) 146/74 SpO2: 97 %  BMI: Estimated body mass index is 30.11 kg/m as calculated from the following:   Height as of this encounter: 5' 3 (1.6 m).   Weight as of this encounter: 170 lb (77.1 kg).  Risk Assessment: Allergies: Reviewed. She is allergic to  benadryl [diphenhydramine hcl], bee venom, and dexamethasone .  Allergy Precautions: None required Coagulopathies: Reviewed. None identified.  Blood-thinner therapy: None at this time Active Infection(s): Reviewed. None identified. Ms. Tsang is afebrile  Site Confirmation: Ms. Veracruz was asked to confirm the procedure and laterality before marking the site Procedure checklist: Completed Consent: Before the procedure and under the influence of no sedative(s), amnesic(s), or anxiolytics, the patient was informed of the treatment options, risks and possible complications. To fulfill our ethical and legal obligations, as recommended by the American Medical Association's Code of Ethics, I have informed the patient of my clinical impression; the nature and purpose of the treatment or procedure; the risks, benefits, and possible complications of the intervention; the alternatives, including doing nothing; the risk(s) and benefit(s) of the alternative treatment(s) or procedure(s); and the risk(s) and benefit(s) of doing nothing.  Ms. Sykora was provided with information about the general risks and possible complications associated with most interventional procedures. These include, but are not limited to: failure to achieve desired goals, infection, bleeding, organ or nerve damage, allergic reactions, paralysis, and/or death.  In addition, she was informed of those risks and possible complications associated to this particular procedure, which include, but are not limited to: damage to the implant; failure to decrease pain; local, systemic, or serious CNS infections, intraspinal abscess with possible cord compression and paralysis, or life-threatening such as meningitis; bleeding; organ damage; nerve injury or damage with subsequent sensory, motor, and/or autonomic system dysfunction, resulting in transient or permanent pain, numbness, and/or weakness of one or several areas of the body; allergic  reactions, either minor or major life-threatening, such as anaphylactic or anaphylactoid reactions.  Furthermore, Ms. Blankenhorn was informed of those risks and complications associated with the medications. These include, but are not limited to: allergic reactions (i.e.: anaphylactic or anaphylactoid reactions); endorphine suppression; bradycardia and/or hypotension; water retention and/or peripheral vascular relaxation leading to lower extremity edema and possible stasis ulcers; respiratory depression and/or shortness of breath; decreased metabolic rate leading to weight gain; swelling or edema; medication-induced neural toxicity; particulate matter embolism and blood vessel occlusion with resultant organ, and/or nervous system infarction; and/or intrathecal granuloma formation with possible spinal cord compression and permanent paralysis.  Before refilling the pump Ms. Beagley was informed that some of the medications used in the devise may not be FDA approved for such use and therefore it constitutes an off-label use of  the medications.  Finally, she was informed that Medicine is not an exact science; therefore, there is also the possibility of unforeseen or unpredictable risks and/or possible complications that may result in a catastrophic outcome. The patient indicated having understood very clearly. We have given the patient no guarantees and we have made no promises. Enough time was given to the patient to ask questions, all of which were answered to the patient's satisfaction. Ms. Marse has indicated that she wanted to continue with the procedure. Attestation: I, the ordering provider, attest that I have discussed with the patient the benefits, risks, side-effects, alternatives, likelihood of achieving goals, and potential problems during recovery for the procedure that I have provided informed consent. Date  Time: 02/10/2024  1:45 PM  Pre-Procedure Preparation:  Monitoring: As per clinic  protocol. Respiration, ETCO2, SpO2, BP, heart rate and rhythm monitor placed and checked for adequate function Safety Precautions: Patient was assessed for positional comfort and pressure points before starting the procedure. Time-out: I initiated and conducted the Time-out before starting the procedure, as per protocol. The patient was asked to participate by confirming the accuracy of the Time Out information. Verification of the correct person, site, and procedure were performed and confirmed by me, the nursing staff, and the patient. Time-out conducted as per Joint Commission's Universal Protocol (UP.01.01.01). Time: 1359 Start Time: 1400 hrs.  Description of Procedure:          Position: Supine Target Area: Central-port of intrathecal pump. Approach: Anterior, 90 degree angle approach. Area Prepped: Entire Area around the pump implant. ChloraPrep (2% chlorhexidine  gluconate and 70% isopropyl alcohol) Safety Precautions: Aspiration looking for blood return was conducted prior to all injections. At no point did we inject any substances, as a needle was being advanced. No attempts were made at seeking any paresthesias. Safe injection practices and needle disposal techniques used. Medications properly checked for expiration dates. SDV (single dose vial) medications used. Description of the Procedure: Protocol guidelines were followed. Two nurses trained to do implant refills were present during the entire procedure. The refill medication was checked by both healthcare providers as well as the patient. The patient was included in the Time-out to verify the medication. The patient was placed in position. The pump was identified. The area was prepped in the usual manner. The sterile template was positioned over the pump, making sure the side-port location matched that of the pump. Both, the pump and the template were held for stability. The needle provided in the Medtronic Kit was then introduced  thru the center of the template and into the central port. The pump content was aspirated and discarded volume documented. The new medication was slowly infused into the pump, thru the filter, making sure to avoid overpressure of the device. The needle was then removed and the area cleansed, making sure to leave some of the prepping solution back to take advantage of its long term bactericidal properties. The pump was interrogated and programmed to reflect the correct medication, volume, and dosage. The program was printed and taken to the physician for approval. Once checked and signed by the physician, a copy was provided to the patient and another scanned into the EMR.  Vitals:   02/10/24 1343  BP: (!) 146/74  Pulse: (!) 57  Resp: 16  Temp: (!) 97 F (36.1 C)  TempSrc: Temporal  SpO2: 97%  Weight: 170 lb (77.1 kg)  Height: 5' 3 (1.6 m)    Start Time: 1400 hrs. End Time: 1407 hrs.  Materials & Medications: Medtronic Refill Kit Medication(s): Please see chart orders for details.  Type of Imaging Technique: None used Indication(s): N/A Exposure Time: No patient exposure Contrast: None used. Fluoroscopic Guidance: N/A Ultrasound Guidance: N/A Interpretation: N/A  Antibiotic Prophylaxis:   Anti-infectives (From admission, onward)    None      Indication(s): None identified  Post-operative Assessment:  Post-procedure Vital Signs:  Pulse/HCG Rate: (!) 57  Temp: (!) 97 F (36.1 C) Resp: 16 BP: (!) 146/74 SpO2: 97 %  EBL: None  Complications: No immediate post-treatment complications observed by team, or reported by patient.  Note: The patient tolerated the entire procedure well. A repeat set of vitals were taken after the procedure and the patient was kept under observation following institutional policy, for this type of procedure. Post-procedural neurological assessment was performed, showing return to baseline, prior to discharge. The patient was provided with  post-procedure discharge instructions, including a section on how to identify potential problems. Should any problems arise concerning this procedure, the patient was given instructions to immediately contact us , at any time, without hesitation. In any case, we plan to contact the patient by telephone for a follow-up status report regarding this interventional procedure.  Comments:  No additional relevant information.  Plan of Care (POC)   Orders:  Orders Placed This Encounter  Procedures   PUMP REFILL    Maintain Protocol by having two(2) healthcare providers during procedure and programming.    Scheduling Instructions:     Please refill intrathecal pump today. (02/10/2024)    Where will this procedure be performed?:   [facility]   PUMP REFILL    Whenever possible schedule on a procedure today.    Standing Status:   Future    Expiration Date:   06/11/2024    Scheduling Instructions:     Please schedule intrathecal pump refill based on pump programming. Avoid schedule intervals of more than 120 days (4 months).    Where will this procedure be performed?:   [facility]   Informed Consent Details: Physician/Practitioner Attestation; Transcribe to consent form and obtain patient signature    Transcribe to consent form and obtain patient signature.    Physician/Practitioner attestation of informed consent for procedure/surgical case:   I, the physician/practitioner, attest that I have discussed with the patient the benefits, risks, side effects, alternatives, likelihood of achieving goals and potential problems during recovery for the procedure that I have provided informed consent.    Procedure:   Intrathecal pump refill    Physician/Practitioner performing the procedure:   Attending Physician: Jalayne Ganesh A. Barth Borne, MD & designated trained staff    Indication/Reason:   Chronic Pain Syndrome (G89.4), presence of an intrathecal pump (Z97.8)    Opioid Analgesic(s):  Morphine   (MS Contin ) 30 mg PO BID (60 mg/day of morphine ) (60 MME/day) +  PF-(intrathecal)-Fentanyl  (13.4 mcg/hr)  changed from Oxycodone  IR 10 mg 4x/day (40 mg/day of oxycodone ) (60 MME/day), due to national shortage MME/day: 60 MME/day (oral) + 32.16 MME/day (intrathecal) = 93.16 MME/day (Aprox. 1.16 MME/day/kg).    Medications ordered for procedure: Meds ordered this encounter  Medications   morphine  (MS CONTIN ) 30 MG 12 hr tablet    Sig: Take 1 tablet (30 mg total) by mouth every 12 (twelve) hours. Must last 30 days. Do not break tablet    Dispense:  60 tablet    Refill:  0    DO NOT: delete (not duplicate); no partial-fill (will deny script to complete), no refill  request (F/U required). DISPENSE: 1 day early if closed on fill date. WARN: No CNS-depressants within 8 hrs of med.   morphine  (MS CONTIN ) 30 MG 12 hr tablet    Sig: Take 1 tablet (30 mg total) by mouth every 12 (twelve) hours. Must last 30 days. Do not break tablet    Dispense:  60 tablet    Refill:  0    DO NOT: delete (not duplicate); no partial-fill (will deny script to complete), no refill request (F/U required). DISPENSE: 1 day early if closed on fill date. WARN: No CNS-depressants within 8 hrs of med.   morphine  (MS CONTIN ) 30 MG 12 hr tablet    Sig: Take 1 tablet (30 mg total) by mouth every 12 (twelve) hours. Must last 30 days. Do not break tablet    Dispense:  60 tablet    Refill:  0    DO NOT: delete (not duplicate); no partial-fill (will deny script to complete), no refill request (F/U required). DISPENSE: 1 day early if closed on fill date. WARN: No CNS-depressants within 8 hrs of med.   Medications administered: Cornelius Dill A. Eubanks had no medications administered during this visit.  See the medical record for exact dosing, route, and time of administration.    Interventional Therapies  Risk Factors  Considerations:   ELIQUIS  Anticoagulation (Stop: 3 days  Restart: 6 hours) Severe anxiety and agoraphobia  Poor  candidate for RFA secondary to Lumbar hardware   Planned  Pending:   Intrathecal pump refill as per pump programming.   Under consideration:   Diagnostic left IA knee joint inj  Diagnostic bilateral L2 TFESI  Diagnostic left genicular NB  Possible left genicular nerve RFA  Diagnostic caudal ESI + epidurogram  Possible Racz procedure    Completed:   Palliative intrathecal pump refills and adjustments  Intrathecal pump insertion by Dr. Manya Sells [facility]) (05/10/2012)  Intrathecal pump replacement by Dr. Gerri Kras [facility]) (05/09/2019)  Spinal cord stimulator replacement by Dr. Manya Sells [facility]) (06/09/2016)    Therapeutic  Palliative (PRN) options:   Therapeutic/palliative intrathecal pump  management (analysis and refill)       Follow-up plan:   Return in about 3 months (around 05/10/2024) for Pump Refill (Max:30mo), Eval-day (M,W), (F2F), (MM).     Recent Visits No visits were found meeting these conditions. Showing recent visits within past 90 days and meeting all other requirements Today's Visits Date Type Provider Dept  02/10/24 Procedure visit Renaldo Caroli, MD [facility]  Showing today's visits and meeting all other requirements Future Appointments Date Type Provider Dept  05/09/24 Appointment Renaldo Caroli, MD [facility]  Showing future appointments within next 90 days and meeting all other requirements   Disposition: Discharge home  Discharge (Date  Time): 02/10/2024; 1415 hrs.   Primary Care Physician: Angelia Kelp, NP Location: [facility] Note by: Candi Chafe, MD (TTS technology used. I apologize for any typographical errors that were not detected and corrected.) Date: 02/10/2024; Time: 2:32 PM  Disclaimer:  Medicine is not an Visual merchandiser. The only guarantee in medicine is that nothing is guaranteed. It is important to note that the  decision to proceed with this intervention was based on the information collected from the patient. The Data and conclusions were drawn from the patient's questionnaire, the interview, and the physical examination. Because the information was provided in large part by the patient, it cannot be guaranteed that it has not  been purposely or unconsciously manipulated. Every effort has been made to obtain as much relevant data as possible for this evaluation. It is important to note that the conclusions that lead to this procedure are derived in large part from the available data. Always take into account that the treatment will also be dependent on availability of resources and existing treatment guidelines, considered by other Pain Management Practitioners as being common knowledge and practice, at the time of the intervention. For Medico-Legal purposes, it is also important to point out that variation in procedural techniques and pharmacological choices are the acceptable norm. The indications, contraindications, technique, and results of the above procedure should only be interpreted and judged by a Board-Certified Interventional Pain Specialist with extensive familiarity and expertise in the same exact procedure and technique.

## 2024-02-11 ENCOUNTER — Telehealth: Payer: Self-pay

## 2024-02-11 NOTE — Telephone Encounter (Signed)
 No issues post intrathecal pain pump refill.

## 2024-02-15 ENCOUNTER — Encounter: Admitting: Pain Medicine

## 2024-02-21 MED FILL — Medication: INTRATHECAL | Qty: 1 | Status: AC

## 2024-02-25 ENCOUNTER — Ambulatory Visit
Admission: EM | Admit: 2024-02-25 | Discharge: 2024-02-25 | Disposition: A | Discharge status: Home / Self Care | Attending: Family Medicine | Admitting: Family Medicine

## 2024-02-25 ENCOUNTER — Other Ambulatory Visit: Payer: Self-pay

## 2024-02-25 DIAGNOSIS — H66001 Acute suppurative otitis media without spontaneous rupture of ear drum, right ear: Secondary | ICD-10-CM

## 2024-02-25 DIAGNOSIS — J069 Acute upper respiratory infection, unspecified: Secondary | ICD-10-CM | POA: Diagnosis not present

## 2024-02-25 MED ORDER — AMOXICILLIN 875 MG PO TABS
875.0000 mg | ORAL_TABLET | Freq: Two times a day (BID) | ORAL | 0 refills | Status: AC
Start: 2024-02-25 — End: 2024-03-06

## 2024-02-25 NOTE — Discharge Instructions (Signed)
 Start amoxicillin  twice daily for 10 days.  We do nasal rinses as tolerated.  Lots of rest and fluids.  Please follow-up with your PCP in 2 days for recheck.  Please go to the ER for any worsening symptoms.  Hope you feel better soon!

## 2024-02-25 NOTE — ED Provider Notes (Signed)
 [facility]    CSN: [id number] Arrival date & time: 02/25/24  1315      History   Chief Complaint No chief complaint on file.   HPI Kendra Nelson is a 64 y.o. female  presents for evaluation of URI symptoms for 3-4 days. Patient reports associated symptoms of sinus pressure/pain with right ear pain, swollen lymph nodes, and intermittent dizziness.  Does endorse subjective fevers.  Denies N/V/D, sore throat, body aches, cough, shortness of breath. Patient does not have a hx of asthma. Patient is an active smoker.   Reports no sick contacts.  Pt has taken Sudafed OTC for symptoms. Pt has no other concerns at this time.   HPI  Past Medical History:  Diagnosis Date   Anxiety    off of valium - stress   Arthritis    left knee- rhumatoid- hasnt seen specialist    Chronic lower back pain    Complication of anesthesia    WOKE DURING BACK SURGERY ; urinary retention 12/20/2017   Hyperlipidemia    Hypertension    Migraine    when seasons change (12/20/2017)   Neuromuscular disorder (HCC)    Restless leg syndrome    Seasonal allergies    Skin abnormalities    Wears glasses     Patient Active Problem List   Diagnosis Date Noted   Drug tolerance, sequela 11/24/2022   Physical tolerance to opiate drug 11/24/2022   Vitamin D  deficiency 11/11/2022   Encounter for adjustment and management of infusion pump 02/05/2022   Uncontrolled hypertension 10/30/2021   Presence of neurostimulator 08/05/2021   Paroxysmal atrial fibrillation (HCC) 07/11/2021   History of total knee replacement (Left) 12/19/2020   Uncomplicated opioid dependence (HCC) 08/15/2020   Weight gain 03/13/2019   Primary osteoarthritis of left knee 12/20/2017   Degenerative arthritis of left knee 12/16/2017   DDD (degenerative disc disease), 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   Effusion of knee (Left) 09/29/2017   DDD (degenerative disc disease), lumbar 09/29/2017   Lumbar intervertebral disc protrusion (L2-3)  09/29/2017   Restless legs syndrome 07/11/2017   Lumbar facet syndrome (Bilateral) (R>L) 06/16/2017   Drug-related hair loss 05/31/2017   Screening for cervical cancer 05/30/2017   Neurogenic pain 05/26/2017   Neuropathic pain 05/26/2017   Chronic musculoskeletal pain 05/26/2017   Presence of intrathecal pump 05/25/2017   Adjustment and management of infusion pump 05/25/2017   Disorder of skeletal system 05/25/2017   Chronic pain syndrome 05/25/2017   Problems influencing health status 05/25/2017   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   Chronic low back pain (1ry area of Pain) (Bilateral) (R>L) w/ sciatica (Bilateral) 05/25/2017   Chronic lower extremity pain (2ry area of Pain) (Bilateral) (R>L) 05/25/2017   Long term prescription opiate use 05/25/2017   Opiate use (93.16 MME/day) 05/25/2017   Cigarette nicotine dependence without complication 11/24/2016   Psoriasis 11/24/2016   Need for influenza vaccination 08/26/2016   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   Battery end of life of spinal cord stimulator 06/04/2016   Pure hypercholesterolemia 02/06/2016   Need for hepatitis C screening test 02/03/2016   Palpitations 02/03/2016   Pharmacologic therapy 02/03/2016   Chronic knee pain (Left) 01/01/2016   Adjustment disorder with anxiety 04/02/2015   Chronic lumbosacral radiculopathy (L5) (Left) 04/02/2015   Primary insomnia 02/18/2015   Chronic back pain greater than 3 months duration 11/12/2014   Agoraphobia with panic disorder 05/23/2014   Muscle spasm of back 09/11/2013   Benign essential  hypertension 11/06/2011   Constipation 10/05/2007   Abnormal weight gain 10/05/2007   Opioid-induced constipation (OIC) 02/10/2007    Past Surgical History:  Procedure Laterality Date   ATRIAL FIBRILLATION ABLATION N/A 06/13/2021   Procedure: ATRIAL FIBRILLATION ABLATION;  Surgeon: Inocencio Soyla Lunger, MD;  Location: [facility];  Service:  Cardiovascular;  Laterality: N/A;   BACK SURGERY     8-9 SURGERIES; lower back   COLONOSCOPY     INTRATHECAL PUMP REVISION N/A 05/09/2019   Procedure: Intrathecal pump change;  Surgeon: Mindi Mt, MD;  Location: [facility] OR;  Service: Neurosurgery;  Laterality: N/A;  Intrathecal pump change   JOINT REPLACEMENT     PAIN PUMP IMPLANTATION  05/10/2012   Procedure: PAIN PUMP INSERTION;  Surgeon: Fairy Levels, MD;  Location: [facility];  Service: Neurosurgery;  Laterality: N/A;  Pump replacement   SPINAL CORD STIMULATOR BATTERY EXCHANGE N/A 06/09/2016   Procedure: IMPLANTABLE PULSE GENERATOR REPLACEMENT WITH RECHARGEABLE BATTERY;  Surgeon: Fairy Levels, MD;  Location: [facility] OR;  Service: Neurosurgery;  Laterality: N/A;   TOTAL KNEE ARTHROPLASTY Left 12/20/2017   TOTAL KNEE ARTHROPLASTY Left 12/20/2017   Procedure: TOTAL KNEE ARTHROPLASTY;  Surgeon: Liam Lerner, MD;  Location: [facility] OR;  Service: Orthopedics;  Laterality: Left;   TOTAL KNEE REVISION Left 12/19/2020   Procedure: TOTAL KNEE REVISION/ Total Knee Polynethylene Barring, Attune Total Knee;  Surgeon: Liam Lerner, MD;  Location: [facility];  Service: Orthopedics;  Laterality: Left;   VAGINAL HYSTERECTOMY      OB History   No obstetric history on file.      Home Medications    Prior to Admission medications   Medication Sig Start Date End Date Taking? Authorizing Provider  amoxicillin  (AMOXIL ) 875 MG tablet Take 1 tablet (875 mg total) by mouth 2 (two) times daily for 10 days. 02/25/24 03/06/24 Yes Loreda Myla SAUNDERS, NP  AMBULATORY NON FORMULARY MEDICATION Medication Name:  Intrathecal pump Fentanyl  1,000.0 mcg/ml Bupivacaine  20.0 mg/ml 40 ml pump Rate 357.2 mcg/day    [provider]  atorvastatin  (LIPITOR) 20 MG tablet Take 1 tablet (20 mg total) by mouth every morning. 03/25/22   Lucien Orren SAILOR, PA-C  carvedilol  (COREG ) 6.25 MG tablet TAKE 1 TABLET BY MOUTH TWICE A DAY 08/11/23   Camnitz, Soyla Lunger, MD  furosemide  (LASIX ) 20 MG  tablet Take 1 tablet (20 mg total) by mouth daily. 07/05/23   Acharya, Gayatri A, MD  lisinopril  (ZESTRIL ) 20 MG tablet TAKE 2 TABLETS(40 MG) BY MOUTH EVERY MORNING 06/16/23   Camnitz, Soyla Lunger, MD  morphine  (MS CONTIN ) 30 MG 12 hr tablet Take 1 tablet (30 mg total) by mouth every 12 (twelve) hours. Must last 30 days. Do not break tablet 02/10/24 03/11/24  Tanya Glisson, MD  morphine  (MS CONTIN ) 30 MG 12 hr tablet Take 1 tablet (30 mg total) by mouth every 12 (twelve) hours. Must last 30 days. Do not break tablet 03/11/24 04/10/24  Tanya Glisson, MD  morphine  (MS CONTIN ) 30 MG 12 hr tablet Take 1 tablet (30 mg total) by mouth every 12 (twelve) hours. Must last 30 days. Do not break tablet 04/10/24 05/10/24  Tanya Glisson, MD  naloxone  (NARCAN ) nasal spray 4 mg/0.1 mL Place 1 spray into the nose as needed for up to 365 doses (for opioid-induced respiratory depresssion). In case of emergency (overdose), spray once into each nostril. If no response within 3 minutes, repeat application and call 911. 08/10/23 08/09/24  Tanya Glisson, MD    Family History  Family History  Problem Relation Age of Onset   Aortic aneurysm Mother    Diabetes Maternal Grandmother     Social History Social History   Tobacco Use   Smoking status: Former    Current packs/day: 0.00    Average packs/day: 1 pack/day for 30.0 years (30.0 ttl pk-yrs)    Types: Cigarettes    Start date: 11/11/1988    Quit date: 11/12/2018    Years since quitting: 5.2   Smokeless tobacco: Never   Tobacco comments:    Former smoker 07/11/2021  Vaping Use   Vaping status: Former  Substance Use Topics   Alcohol use: No   Drug use: Never     Allergies   Benadryl [diphenhydramine hcl], Bee venom, and Dexamethasone    Review of Systems Review of Systems  HENT:  Positive for congestion, ear pain, sinus pressure and sinus pain.      Physical Exam Triage Vital Signs ED Triage Vitals  Encounter Vitals Group     BP  02/25/24 1326 (!) 142/84     Girls Systolic BP Percentile --      Girls Diastolic BP Percentile --      Boys Systolic BP Percentile --      Boys Diastolic BP Percentile --      Pulse Rate 02/25/24 1326 77     Resp 02/25/24 1326 16     Temp 02/25/24 1326 98 F (36.7 C)     Temp Source 02/25/24 1326 Oral     SpO2 02/25/24 1326 96 %     Weight --      Height --      Head Circumference --      Peak Flow --      Pain Score 02/25/24 1324 5     Pain Loc --      Pain Education --      Exclude from Growth Chart --    No data found.  Updated Vital Signs BP (!) 142/84   Pulse 77   Temp 98 F (36.7 C) (Oral)   Resp 16   SpO2 96%   Visual Acuity Right Eye Distance:   Left Eye Distance:   Bilateral Distance:    Right Eye Near:   Left Eye Near:    Bilateral Near:     Physical Exam Vitals and nursing note reviewed.  Constitutional:      General: She is not in acute distress.    Appearance: She is well-developed. She is not ill-appearing.  HENT:     Head: Normocephalic and atraumatic.     Right Ear: Ear canal normal. Tympanic membrane is erythematous.     Left Ear: Tympanic membrane and ear canal normal.     Nose: Congestion present.     Mouth/Throat:     Mouth: Mucous membranes are moist.     Pharynx: Oropharynx is clear. Uvula midline. No oropharyngeal exudate or posterior oropharyngeal erythema.     Tonsils: No tonsillar exudate or tonsillar abscesses.   Eyes:     Conjunctiva/sclera: Conjunctivae normal.     Pupils: Pupils are equal, round, and reactive to light.    Cardiovascular:     Rate and Rhythm: Normal rate and regular rhythm.     Heart sounds: Normal heart sounds.  Pulmonary:     Effort: Pulmonary effort is normal. No respiratory distress.     Breath sounds: Normal breath sounds. No wheezing, rhonchi or rales.   Musculoskeletal:     Cervical back: Normal range  of motion and neck supple.  Lymphadenopathy:     Cervical: No cervical adenopathy.   Skin:     General: Skin is warm and dry.   Neurological:     General: No focal deficit present.     Mental Status: She is alert and oriented to person, place, and time.   Psychiatric:        Mood and Affect: Mood normal.        Behavior: Behavior normal.      UC Treatments / Results  Labs (all labs ordered are listed, but only abnormal results are displayed) Labs Reviewed - No data to display  EKG   Radiology No results found.  Procedures Procedures (including critical care time)  Medications Ordered in UC Medications - No data to display  Initial Impression / Assessment and Plan / UC Course  I have reviewed the triage vital signs and the nursing notes.  Pertinent labs & imaging results that were available during my care of the patient were reviewed by me and considered in my medical decision making (see chart for details).     Reviewed exam and symptoms with patient.  No red flags.  She declined COVID testing.  Discussed viral illness with right otitis media.  Will do amoxicillin .  Discussed rest fluids and PCP follow-up 2 days for recheck.  ER precautions reviewed. Final Clinical Impressions(s) / UC Diagnoses   Final diagnoses:  Acute suppurative otitis media of right ear without spontaneous rupture of tympanic membrane, recurrence not specified  Viral upper respiratory illness     Discharge Instructions      Start amoxicillin  twice daily for 10 days.  We do nasal rinses as tolerated.  Lots of rest and fluids.  Please follow-up with your PCP in 2 days for recheck.  Please go to the ER for any worsening symptoms.  Hope you feel better soon!   ED Prescriptions     Medication Sig Dispense Auth. Provider   amoxicillin  (AMOXIL ) 875 MG tablet Take 1 tablet (875 mg total) by mouth 2 (two) times daily for 10 days. 20 tablet Ellison Rieth, Jodi R, NP      PDMP not reviewed this encounter.   Loreda Myla SAUNDERS, NP 02/25/24 [phone]

## 2024-02-25 NOTE — ED Triage Notes (Signed)
 Pt c/o facial swellingx3d. Pt c/o right side of neck swelling and right ear clicking started yesterday. Pt c/o dizzinessx3d. Pt has facial pain

## 2024-02-27 ENCOUNTER — Other Ambulatory Visit: Payer: Self-pay | Admitting: Cardiology

## 2024-04-11 ENCOUNTER — Other Ambulatory Visit: Payer: Self-pay

## 2024-04-11 MED ORDER — PAIN MANAGEMENT IT PUMP REFILL: 1.0000 | Freq: Once | INTRATHECAL | 0 refills | Status: AC

## 2024-05-05 ENCOUNTER — Ambulatory Visit
Admission: EM | Admit: 2024-05-05 | Discharge: 2024-05-05 | Disposition: A | Discharge status: Home / Self Care | Attending: Family Medicine | Admitting: Family Medicine

## 2024-05-05 DIAGNOSIS — H6992 Unspecified Eustachian tube disorder, left ear: Secondary | ICD-10-CM | POA: Diagnosis not present

## 2024-05-05 MED ORDER — FLUTICASONE PROPIONATE 50 MCG/ACT NA SUSP: 1.0000 | Freq: Every day | NASAL | 0 refills | Status: DC

## 2024-05-05 NOTE — ED Provider Notes (Signed)
 [facility]    CSN: [id number] Arrival date & time: 05/05/24  1710      History   Chief Complaint Chief Complaint  Patient presents with   Ear Pain   Sore Throat    HPI Kendra Kendra Nelson is a 63 y.o. female presents for ear pain.  Patient reports 2 to 3 days of left ear pain that radiates into her jaw and is causing her headache.  Denies any drainage from the ear, fevers, cough or congestion, tooth pain.  She has been using hydrogen peroxide without improvement as well as taking aspirin .  Patient is a chronic pain patient and does take narcotics regularly.  No other concerns at this time.   Sore Throat    Past Medical History:  Diagnosis Date   Anxiety    off of valium - stress   Arthritis    left knee- rhumatoid- hasnt seen specialist    Chronic lower back pain    Complication of anesthesia    WOKE DURING BACK SURGERY ; urinary retention 12/20/2017   Hyperlipidemia    Hypertension    Migraine    when seasons change (12/20/2017)   Neuromuscular disorder (HCC)    Restless leg syndrome    Seasonal allergies    Skin abnormalities    Wears glasses     Patient Active Problem List   Diagnosis Date Noted   Drug tolerance, sequela 11/24/2022   Physical tolerance to opiate drug 11/24/2022   Vitamin D  deficiency 11/11/2022   Encounter for adjustment and management of infusion pump 02/05/2022   Uncontrolled hypertension 10/30/2021   Presence of neurostimulator 08/05/2021   Paroxysmal atrial fibrillation (HCC) 07/11/2021   History of total knee replacement (Left) 12/19/2020   Uncomplicated opioid dependence (HCC) 08/15/2020   Weight gain 03/13/2019   Primary osteoarthritis of left knee 12/20/2017   Degenerative arthritis of left knee 12/16/2017   DDD (degenerative disc disease), 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   Effusion of knee (Left) 09/29/2017   DDD (degenerative disc disease), lumbar 09/29/2017   Lumbar intervertebral disc protrusion (L2-3) 09/29/2017   Restless  legs syndrome 07/11/2017   Lumbar facet syndrome (Bilateral) (R>L) 06/16/2017   Drug-related hair loss 05/31/2017   Screening for cervical cancer 05/30/2017   Neurogenic pain 05/26/2017   Neuropathic pain 05/26/2017   Chronic musculoskeletal pain 05/26/2017   Presence of intrathecal pump 05/25/2017   Adjustment and management of infusion pump 05/25/2017   Disorder of skeletal system 05/25/2017   Chronic pain syndrome 05/25/2017   Problems influencing health status 05/25/2017   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   Chronic low back pain (1ry area of Pain) (Bilateral) (R>L) w/ sciatica (Bilateral) 05/25/2017   Chronic lower extremity pain (2ry area of Pain) (Bilateral) (R>L) 05/25/2017   Long term prescription opiate use 05/25/2017   Opiate use (93.16 MME/day) 05/25/2017   Cigarette nicotine dependence without complication 11/24/2016   Psoriasis 11/24/2016   Need for influenza vaccination 08/26/2016   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   Battery end of life of spinal cord stimulator 06/04/2016   Pure hypercholesterolemia 02/06/2016   Need for hepatitis C screening test 02/03/2016   Palpitations 02/03/2016   Pharmacologic therapy 02/03/2016   Chronic knee pain (Left) 01/01/2016   Adjustment disorder with anxiety 04/02/2015   Chronic lumbosacral radiculopathy (L5) (Left) 04/02/2015   Primary insomnia 02/18/2015   Chronic back pain greater than 3 months duration 11/12/2014   Agoraphobia with panic disorder 05/23/2014   Muscle spasm of  back 09/11/2013   Benign essential hypertension 11/06/2011   Constipation 10/05/2007   Abnormal weight gain 10/05/2007   Opioid-induced constipation (OIC) 02/10/2007    Past Surgical History:  Procedure Laterality Date   ATRIAL FIBRILLATION ABLATION N/A 06/13/2021   Procedure: ATRIAL FIBRILLATION ABLATION;  Surgeon: Inocencio Soyla Lunger, MD;  Location: [facility];  Service: Cardiovascular;  Laterality:  N/A;   BACK SURGERY     8-9 SURGERIES; lower back   COLONOSCOPY     INTRATHECAL PUMP REVISION N/A 05/09/2019   Procedure: Intrathecal pump change;  Surgeon: Mindi Mt, MD;  Location: [facility] OR;  Service: Neurosurgery;  Laterality: N/A;  Intrathecal pump change   JOINT REPLACEMENT     PAIN PUMP IMPLANTATION  05/10/2012   Procedure: PAIN PUMP INSERTION;  Surgeon: Fairy Levels, MD;  Location: [facility];  Service: Neurosurgery;  Laterality: N/A;  Pump replacement   SPINAL CORD STIMULATOR BATTERY EXCHANGE N/A 06/09/2016   Procedure: IMPLANTABLE PULSE GENERATOR REPLACEMENT WITH RECHARGEABLE BATTERY;  Surgeon: Fairy Levels, MD;  Location: [facility] OR;  Service: Neurosurgery;  Laterality: N/A;   TOTAL KNEE ARTHROPLASTY Left 12/20/2017   TOTAL KNEE ARTHROPLASTY Left 12/20/2017   Procedure: TOTAL KNEE ARTHROPLASTY;  Surgeon: Liam Lerner, MD;  Location: [facility] OR;  Service: Orthopedics;  Laterality: Left;   TOTAL KNEE REVISION Left 12/19/2020   Procedure: TOTAL KNEE REVISION/ Total Knee Polynethylene Barring, Attune Total Knee;  Surgeon: Liam Lerner, MD;  Location: [facility];  Service: Orthopedics;  Laterality: Left;   VAGINAL HYSTERECTOMY      OB History   No obstetric history on file.      Home Medications    Prior to Admission medications   Medication Sig Start Date End Date Taking? Authorizing Provider  fluticasone  (FLONASE ) 50 MCG/ACT nasal spray Place 1 spray into both nostrils daily. 05/05/24  Yes Loreda Myla SAUNDERS, NP  AMBULATORY NON FORMULARY MEDICATION Medication Name:  Intrathecal pump Fentanyl  1,000.0 mcg/ml Bupivacaine  20.0 mg/ml 40 ml pump Rate 357.2 mcg/day    [provider]  atorvastatin  (LIPITOR) 20 MG tablet Take 1 tablet (20 mg total) by mouth every morning. 03/25/22   Lucien Orren SAILOR, PA-C  carvedilol  (COREG ) 6.25 MG tablet TAKE 1 TABLET BY MOUTH TWICE A DAY 02/29/24   Camnitz, Soyla Lunger, MD  furosemide  (LASIX ) 20 MG tablet Take 1 tablet (20 mg total) by mouth daily. 07/05/23    Acharya, Gayatri A, MD  lisinopril  (ZESTRIL ) 20 MG tablet TAKE 2 TABLETS(40 MG) BY MOUTH EVERY MORNING 06/16/23   Camnitz, Soyla Lunger, MD  morphine  (MS CONTIN ) 30 MG 12 hr tablet Take 1 tablet (30 mg total) by mouth every 12 (twelve) hours. Must last 30 days. Do not break tablet 02/10/24 03/11/24  Tanya Glisson, MD  morphine  (MS CONTIN ) 30 MG 12 hr tablet Take 1 tablet (30 mg total) by mouth every 12 (twelve) hours. Must last 30 days. Do not break tablet 03/11/24 04/10/24  Tanya Glisson, MD  morphine  (MS CONTIN ) 30 MG 12 hr tablet Take 1 tablet (30 mg total) by mouth every 12 (twelve) hours. Must last 30 days. Do not break tablet 04/10/24 05/10/24  Tanya Glisson, MD  naloxone  (NARCAN ) nasal spray 4 mg/0.1 mL Place 1 spray into the nose as needed for up to 365 doses (for opioid-induced respiratory depresssion). In case of emergency (overdose), spray once into each nostril. If no response within 3 minutes, repeat application and call 911. 08/10/23 08/09/24  Tanya Glisson, MD    Family History Family  History  Problem Relation Age of Onset   Aortic aneurysm Mother    Diabetes Maternal Grandmother     Social History Social History   Tobacco Use   Smoking status: Former    Current packs/day: 0.00    Average packs/day: 1 pack/day for 30.0 years (30.0 ttl pk-yrs)    Types: Cigarettes    Start date: 11/11/1988    Quit date: 11/12/2018    Years since quitting: 5.4   Smokeless tobacco: Never   Tobacco comments:    Former smoker 07/11/2021  Vaping Use   Vaping status: Former  Substance Use Topics   Alcohol use: No   Drug use: Never     Allergies   Benadryl [diphenhydramine hcl], Bee venom, and Dexamethasone    Review of Systems Review of Systems  HENT:  Positive for ear pain.      Physical Exam Triage Vital Signs ED Triage Vitals [05/05/24 1805]  Encounter Vitals Group     BP (!) 171/82     Girls Systolic BP Percentile      Girls Diastolic BP Percentile      Boys  Systolic BP Percentile      Boys Diastolic BP Percentile      Pulse Rate 70     Resp 19     Temp 98.6 F (37 C)     Temp Source Oral     SpO2 95 %     Weight      Height      Head Circumference      Peak Flow      Pain Score 8     Pain Loc      Pain Education      Exclude from Growth Chart    No data found.  Updated Vital Signs BP (!) 171/82 (BP Location: Left Arm)   Pulse 70   Temp 98.6 F (37 C) (Oral)   Resp 19   SpO2 95%   Visual Acuity Right Eye Distance:   Left Eye Distance:   Bilateral Distance:    Right Eye Near:   Left Eye Near:    Bilateral Near:     Physical Exam Vitals and nursing note reviewed.  Constitutional:      General: She is not in acute distress.    Appearance: Normal appearance. She is not ill-appearing.  HENT:     Head: Normocephalic and atraumatic.     Right Ear: Tympanic membrane and ear canal normal.     Left Ear: Ear canal normal. A middle ear effusion is present. No mastoid tenderness. Tympanic membrane is not erythematous.  Eyes:     Pupils: Pupils are equal, round, and reactive to light.  Cardiovascular:     Rate and Rhythm: Normal rate.  Pulmonary:     Effort: Pulmonary effort is normal.  Skin:    General: Skin is warm and dry.  Neurological:     General: No focal deficit present.     Mental Status: She is alert and oriented to person, place, and time.  Psychiatric:        Mood and Affect: Mood normal.        Behavior: Behavior normal.      UC Treatments / Results  Labs (all labs ordered are listed, but only abnormal results are displayed) Labs Reviewed - No data to display  EKG   Radiology No results found.  Procedures Procedures (including critical care time)  Medications Ordered in UC Medications - No data to  display  Initial Impression / Assessment and Plan / UC Course  I have reviewed the triage vital signs and the nursing notes.  Pertinent labs & imaging results that were available during my care  of the patient were reviewed by me and considered in my medical decision making (see chart for details).     Reviewed exam and symptoms with patient.  Discussed eustachian tube dysfunction.  Trial of Flonase  she may use over-the-counter allergy medicine daily.  Advised PCP follow-up if symptoms do not improve.  ER precautions reviewed. Final Clinical Impressions(s) / UC Diagnoses   Final diagnoses:  Eustachian tube dysfunction, left     Discharge Instructions      Start Flonase  daily.  You may take either Claritin-D or Zyrtec-D over-the-counter to help with your symptoms as well.  Warm compresses to the outside of the ear typically are soothing.  Follow-up with your PCP if your symptoms do not improve.  Please go to the ER if you develop any worsening symptoms.  I hope you feel better soon!    ED Prescriptions     Medication Sig Dispense Auth. Provider   fluticasone  (FLONASE ) 50 MCG/ACT nasal spray Place 1 spray into both nostrils daily. 15.8 mL Chalonda Schlatter, Jodi R, NP      PDMP not reviewed this encounter.   Loreda Myla SAUNDERS, NP 05/05/24 [phone]

## 2024-05-05 NOTE — ED Triage Notes (Signed)
 Pt present with lt ear pain x three days. States yesterday she took DayQuil and has not had any relief.  States she feels pressure from the lt side of the head to the jawline.

## 2024-05-05 NOTE — Discharge Instructions (Addendum)
 Start Flonase  daily.  You may take either Claritin-D or Zyrtec-D over-the-counter to help with your symptoms as well.  Warm compresses to the outside of the ear typically are soothing.  Follow-up with your PCP if your symptoms do not improve.  Please go to the ER if you develop any worsening symptoms.  I hope you feel better soon!

## 2024-05-08 NOTE — Patient Instructions (Incomplete)
 ______________________________________________________________________    Opioid Pain Medication Update  To: All patients taking opioid pain medications. (I.e.: hydrocodone, hydromorphone, oxycodone, oxymorphone, morphine, codeine, methadone, tapentadol, tramadol, buprenorphine, fentanyl, etc.)  Re: Updated review of side effects and adverse reactions of opioid analgesics, as well as new information about long term effects of this class of medications.  Direct risks of long-term opioid therapy are not limited to opioid addiction and overdose. Potential medical risks include serious fractures, breathing problems during sleep, hyperalgesia, immunosuppression, chronic constipation, bowel obstruction, myocardial infarction, and tooth decay secondary to xerostomia.  Unpredictable adverse effects that can occur even if you take your medication correctly: Cognitive impairment, respiratory depression, and death. Most people think that if they take their medication "correctly", and "as instructed", that they will be safe. Nothing could be farther from the truth. In reality, a significant amount of recorded deaths associated with the use of opioids has occurred in individuals that had taken the medication for a long time, and were taking their medication correctly. The following are examples of how this can happen: Patient taking his/her medication for a long time, as instructed, without any side effects, is given a certain antibiotic or another unrelated medication, which in turn triggers a "Drug-to-drug interaction" leading to disorientation, cognitive impairment, impaired reflexes, respiratory depression or an untoward event leading to serious bodily harm or injury, including death.  Patient taking his/her medication for a long time, as instructed, without any side effects, develops an acute impairment of liver and/or kidney function. This will lead to a rapid inability of the body to breakdown and eliminate  their pain medication, which will result in effects similar to an "overdose", but with the same medicine and dose that they had always taken. This again may lead to disorientation, cognitive impairment, impaired reflexes, respiratory depression or an untoward event leading to serious bodily harm or injury, including death.  A similar problem will occur with patients as they grow older and their liver and kidney function begins to decrease as part of the aging process.  Background information: Historically, the original case for using long-term opioid therapy to treat chronic noncancer pain was based on safety assumptions that subsequent experience has called into question. In 1996, the American Pain Society and the American Academy of Pain Medicine issued a consensus statement supporting long-term opioid therapy. This statement acknowledged the dangers of opioid prescribing but concluded that the risk for addiction was low; respiratory depression induced by opioids was short-lived, occurred mainly in opioid-naive patients, and was antagonized by pain; tolerance was not a common problem; and efforts to control diversion should not constrain opioid prescribing. This has now proven to be wrong. Experience regarding the risks for opioid addiction, misuse, and overdose in community practice has failed to support these assumptions.  According to the Centers for Disease Control and Prevention, fatal overdoses involving opioid analgesics have increased sharply over the past decade. Currently, more than 96,700 people die from drug overdoses every year. Opioids are a factor in 7 out of every 10 overdose deaths. Deaths from drug overdose have surpassed motor vehicle accidents as the leading cause of death for individuals between the ages of 65 and 55.  Clinical data suggest that neuroendocrine dysfunction may be very common in both men and women, potentially causing hypogonadism, erectile dysfunction, infertility,  decreased libido, osteoporosis, and depression. Recent studies linked higher opioid dose to increased opioid-related mortality. Controlled observational studies reported that long-term opioid therapy may be associated with increased risk for cardiovascular events. Subsequent  meta-analysis concluded that the safety of long-term opioid therapy in elderly patients has not been proven.   Side Effects and adverse reactions: Common side effects: Drowsiness (sedation). Dizziness. Nausea and vomiting. Constipation. Physical dependence -- Dependence often manifests with withdrawal symptoms when opioids are discontinued or decreased. Tolerance -- As you take repeated doses of opioids, you require increased medication to experience the same effect of pain relief. Respiratory depression -- This can occur in healthy people, especially with higher doses. However, people with COPD, asthma or other lung conditions may be even more susceptible to fatal respiratory impairment.  Uncommon side effects: An increased sensitivity to feeling pain and extreme response to pain (hyperalgesia). Chronic use of opioids can lead to this. Delayed gastric emptying (the process by which the contents of your stomach are moved into your small intestine). Muscle rigidity. Immune system and hormonal dysfunction. Quick, involuntary muscle jerks (myoclonus). Arrhythmia. Itchy skin (pruritus). Dry mouth (xerostomia).  Long-term side effects: Chronic constipation. Sleep-disordered breathing (SDB). Increased risk of bone fractures. Hypothalamic-pituitary-adrenal dysregulation. Increased risk of overdose.  RISKS: Respiratory depression and death: Opioids increase the risk of respiratory depression and death.  Drug-to-drug interactions: Opioids are relatively contraindicated in combination with benzodiazepines, sleep inducers, and other central nervous system depressants. Other classes of medications (i.e.: certain antibiotics  and even over-the-counter medications) may also trigger or induce respiratory depression in some patients.  Medical conditions: Patients with pre-existing respiratory problems are at higher risk of respiratory failure and/or depression when in combination with opioid analgesics. Opioids are relatively contraindicated in some medical conditions such as central sleep apnea.   Fractures and Falls:  Opioids increase the risk and incidence of falls. This is of particular importance in elderly patients.  Endocrine System:  Long-term administration is associated with endocrine abnormalities (endocrinopathies). (Also known as Opioid-induced Endocrinopathy) Influences on both the hypothalamic-pituitary-adrenal axis?and the hypothalamic-pituitary-gonadal axis have been demonstrated with consequent hypogonadism and adrenal insufficiency in both sexes. Hypogonadism and decreased levels of dehydroepiandrosterone sulfate have been reported in men and women. Endocrine effects include: Amenorrhoea in women (abnormal absence of menstruation) Reduced libido in both sexes Decreased sexual function Erectile dysfunction in men Hypogonadisms (decreased testicular function with shrinkage of testicles) Infertility Depression and fatigue Loss of muscle mass Anxiety Depression Immune suppression Hyperalgesia Weight gain Anemia Osteoporosis Patients (particularly women of childbearing age) should avoid opioids. There is insufficient evidence to recommend routine monitoring of asymptomatic patients taking opioids in the long-term for hormonal deficiencies.  Immune System: Human studies have demonstrated that opioids have an immunomodulating effect. These effects are mediated via opioid receptors both on immune effector cells and in the central nervous system. Opioids have been demonstrated to have adverse effects on antimicrobial response and anti-tumour surveillance. Buprenorphine has been demonstrated to have  no impact on immune function.  Opioid Induced Hyperalgesia: Human studies have demonstrated that prolonged use of opioids can lead to a state of abnormal pain sensitivity, sometimes called opioid induced hyperalgesia (OIH). Opioid induced hyperalgesia is not usually seen in the absence of tolerance to opioid analgesia. Clinically, hyperalgesia may be diagnosed if the patient on long-term opioid therapy presents with increased pain. This might be qualitatively and anatomically distinct from pain related to disease progression or to breakthrough pain resulting from development of opioid tolerance. Pain associated with hyperalgesia tends to be more diffuse than the pre-existing pain and less defined in quality. Management of opioid induced hyperalgesia requires opioid dose reduction.  Cancer: Chronic opioid therapy has been associated with an increased risk  of cancer among noncancer patients with chronic pain. This association was more evident in chronic strong opioid users. Chronic opioid consumption causes significant pathological changes in the small intestine and colon. Epidemiological studies have found that there is a link between opium dependence and initiation of gastrointestinal cancers. Cancer is the second leading cause of death after cardiovascular disease. Chronic use of opioids can cause multiple conditions such as GERD, immunosuppression and renal damage as well as carcinogenic effects, which are associated with the incidence of cancers.   Mortality: Long-term opioid use has been associated with increased mortality among patients with chronic non-cancer pain (CNCP).  Prescription of long-acting opioids for chronic noncancer pain was associated with a significantly increased risk of all-cause mortality, including deaths from causes other than overdose.  Reference: Von Korff M, Kolodny A, Deyo RA, Chou R. Long-term opioid therapy reconsidered. Ann Intern Med. 2011 Sep 6;155(5):325-8. doi:  10.7326/[id number]. PMID: 16109604; PMCID: [id number]. Randon Goldsmith, Hayward RA, Dunn KM, [place] KP. Risk of adverse events in patients prescribed long-term opioids: A cohort study in the [place] [facility] Research Datalink. Eur J Pain. 2019 May;23(5):908-922. doi: 10.1002/ejp.1357. Epub 2019 Jan 31. PMID: 78295621. Colameco S, Coren JS, Ciervo CA. Continuous opioid treatment for chronic noncancer pain: a time for moderation in prescribing. Postgrad Med. 2009 Jul;121(4):61-6. doi: 10.3810/pgm.2009.07.2032. PMID: 30865784. William Hamburger RN, [place] SD, Blazina I, Cristopher Peru, Bougatsos C, Deyo RA. The effectiveness and risks of long-term opioid therapy for chronic pain: a systematic review for a [employer] of [employer] Pathways to [employer]. Ann Intern Med. 2015 Feb 17;162(4):276-86. doi: 10.7326/M14-2559. PMID: 69629528. [facility], Makuc DM. NCHS Data Brief No. 22. [place]: Centers for Disease Control and Prevention; 2009. Sep, Increase in Fatal Poisonings Involving Opioid Analgesics in the [place], 1999-2006. Song IA, Choi HR, Oh TK. Long-term opioid use and mortality in patients with chronic non-cancer pain: Ten-year follow-up study in [place] from 2010 through 2019. EClinicalMedicine. 2022 Jul 18;51:101558. doi: 10.1016/j.eclinm.2022.413244. PMID: 01027253; PMCID: [id number]. Huser, W., Schubert, T., Vogelmann, T. et al. All-cause mortality in patients with long-term opioid therapy compared with non-opioid analgesics for chronic non-cancer pain: a database study. BMC Med 18, 162 (2020). http://lester.info/ Rashidian H, Karie Kirks, Malekzadeh R, Haghdoost AA. An Ecological Study of the Association between Opiate Use and Incidence of Cancers. Addict Health. 2016 Fall;8(4):252-260. PMID: 59563875; PMCID: [id number].  Our Goal: Our goal is to control your pain with means other  than the use of opioid pain medications.  Our Recommendation: Talk to your physician about coming off of these medications. We can assist you with the tapering down and stopping these medicines. Based on the new information, even if you cannot completely stop the medication, a decrease in the dose may be associated with a lesser risk. Ask for other means of controlling the pain. Decrease or eliminate those factors that significantly contribute to your pain such as smoking, obesity, and a diet heavily tilted towards "inflammatory" nutrients.  Last Updated: 03/08/2023   ______________________________________________________________________       ______________________________________________________________________    National Pain Medication Shortage  The [place] is experiencing worsening drug shortages. These have had a negative widespread effect on patient care and treatment. Not expected to improve any time soon. Predicted to last past 2029.   Drug shortage list (generic names) Oxycodone IR Oxycodone/APAP Oxymorphone IR Hydromorphone Hydrocodone/APAP Morphine  Where is the problem?  Manufacturing and supply level.  Will this shortage  affect you?  Only if you take any of the above pain medications.  How? You may be unable to fill your prescription.  Your pharmacist may offer a "partial fill" of your prescription. (Warning: Do not accept partial fills.) Prescriptions partially filled cannot be transferred to another pharmacy. Read our Medication Rules and Regulation. Depending on how much medicine you are dependent on, you may experience withdrawals when unable to get the medication.  Recommendations: Consider ending your dependence on opioid pain medications. Ask your pain specialist to assist you with the process. Consider switching to a medication currently not in shortage, such as Buprenorphine. Talk to your pain specialist about this option. Consider decreasing your pain  medication requirements by managing tolerance thru "Drug Holidays". This may help minimize withdrawals, should you run out of medicine. Control your pain thru the use of non-pharmacological interventional therapies.   Your prescriber: Prescribers cannot be blamed for shortages. Medication manufacturing and supply issues cannot be fixed by the prescriber.   NOTE: The prescriber is not responsible for supplying the medication, or solving supply issues. Work with your pharmacist to solve it. The patient is responsible for the decision to take or continue taking the medication and for identifying and securing a legal supply source. By law, supplying the medication is the job and responsibility of the pharmacy. The prescriber is responsible for the evaluation, monitoring, and prescribing of these medications.   Prescribers will NOT: Re-issue prescriptions that have been partially filled. Re-issue prescriptions already sent to a pharmacy.  Re-send prescriptions to a different pharmacy because yours did not have your medication. Ask pharmacist to order more medicine or transfer the prescription to another pharmacy. (Read below.)  New 2023 regulation: "May 01, 2022 Revised Regulation Allows DEA-Registered Pharmacies to Transfer Electronic Prescriptions at a Patient's Request DEA Headquarters Division - Public Information Office Patients now have the ability to request their electronic prescription be transferred to another pharmacy without having to go back to their practitioner to initiate the request. This revised regulation went into effect on Monday, April 27, 2022.     At a patient's request, a DEA-registered retail pharmacy can now transfer an electronic prescription for a controlled substance (schedules II-V) to another DEA-registered retail pharmacy. Prior to this change, patients would have to go through their practitioner to cancel their prescription and have it re-issued to a different  pharmacy. The process was taxing and time consuming for both patients and practitioners.    The Drug Enforcement Administration [facility]) published its intent to revise the process for transferring electronic prescriptions on July 19, 2020.  The final rule was published in the federal register on March 26, 2022 and went into effect 30 days later.  Under the final rule, a prescription can only be transferred once between pharmacies, and only if allowed under existing state or other applicable law. The prescription must remain in its electronic form; may not be altered in any way; and the transfer must be communicated directly between two licensed pharmacists. It's important to note, any authorized refills transfer with the original prescription, which means the entire prescription will be filled at the same pharmacy".  Reference: [email or web address] [facility] website announcement)  [email or web address] [employer] of [employer])   [employer] / Vol. 88, No. 143 / Thursday, March 26, 2022 / Rules and Regulations DEPARTMENT OF JUSTICE  Drug Enforcement Administration  21 CFR Part 1306  [Docket No. [id number]]  RIN [id number] Transfer of Electronic Prescriptions for Schedules II-V Controlled Substances Between  Pharmacies for Initial Filling  ______________________________________________________________________       ______________________________________________________________________    Transfer of Pain Medication between Pharmacies  Re: 2023 DEA Clarification on existing regulation  Published on DEA Website: May 01, 2022  Title: Revised Regulation Allows DEA-Registered Pharmacies to Electrical engineer Prescriptions at a Patient's Request DEA Headquarters Division - [employer]  "Patients now have the ability to  request their electronic prescription be transferred to another pharmacy without having to go back to their practitioner to initiate the request. This revised regulation went into effect on Monday, April 27, 2022.     At a patient's request, a DEA-registered retail pharmacy can now transfer an electronic prescription for a controlled substance (schedules II-V) to another DEA-registered retail pharmacy. Prior to this change, patients would have to go through their practitioner to cancel their prescription and have it re-issued to a different pharmacy. The process was taxing and time consuming for both patients and practitioners.    The Drug Enforcement Administration [facility]) published its intent to revise the process for transferring electronic prescriptions on July 19, 2020.  The final rule was published in the federal register on March 26, 2022 and went into effect 30 days later.  Under the final rule, a prescription can only be transferred once between pharmacies, and only if allowed under existing state or other applicable law. The prescription must remain in its electronic form; may not be altered in any way; and the transfer must be communicated directly between two licensed pharmacists. It's important to note, any authorized refills transfer with the original prescription, which means the entire prescription will be filled at the same pharmacy."    REFERENCES: 1. DEA website announcement [email or web address]  2. Department of Justice website  [email or web address]  3. DEPARTMENT OF JUSTICE Drug Enforcement Administration 21 CFR Part 1306 [Docket No. [id number]] RIN [id number] "Transfer of Electronic Prescriptions for Schedules II-V Controlled Substances Between Pharmacies for Initial  Filling"  ______________________________________________________________________       ______________________________________________________________________    Medication Rules  Purpose: To inform patients, and their family members, of our medication rules and regulations.  Applies to: All patients receiving prescriptions from our practice (written or electronic).  Pharmacy of record: This is the pharmacy where your electronic prescriptions will be sent. Make sure we have the correct one.  Electronic prescriptions: In compliance with the [facility] Strengthen Opioid Misuse Prevention (STOP) Act of 2017 (Session Conni Elliot [phone]), effective August 31, 2018, all controlled substances must be electronically prescribed. Written prescriptions, faxing, or calling prescriptions to a pharmacy will no longer be done.  Prescription refills: These will be provided only during in-person appointments. No medications will be renewed without a "face-to-face" evaluation with your provider. Applies to all prescriptions.  NOTE: The following applies primarily to controlled substances (Opioid* Pain Medications).   Type of encounter (visit): For patients receiving controlled substances, face-to-face visits are required. (Not an option and not up to the patient.)  Patient's Responsibilities: Pain Pills: Bring all pain pills to every appointment (except for procedure appointments). Pill counts are required.  Pill Bottles: Bring pills in original pharmacy bottle. Bring bottle, even if empty. Always bring the bottle of the most recent fill.  Medication refills: You are responsible for knowing and keeping track of what medications you are taking and when is it that you will need a refill. The day before your appointment: write a list of all prescriptions that need to be refilled. The day of the appointment: give the list to  the admitting nurse. Prescriptions will be written only during appointments. No  prescriptions will be written on procedure days. If you forget a medication: it will not be "Called in", "Faxed", or "electronically sent". You will need to get another appointment to get these prescribed. No early refills. Do not call asking to have your prescription filled early. Partial  or short prescriptions: Occasionally your pharmacy may not have enough pills to fill your prescription.  NEVER ACCEPT a partial fill or a prescription that is short of the total amount of pills that you were prescribed.  With controlled substances the law allows 72 hours for the pharmacy to complete the prescription.  If the prescription is not completed within 72 hours, the pharmacist will require a new prescription to be written. This means that you will be short on your medicine and we WILL NOT send another prescription to complete your original prescription.  Instead, request the pharmacy to send a carrier to a nearby branch to get enough medication to provide you with your full prescription. Prescription Accuracy: You are responsible for carefully inspecting your prescriptions before leaving our office. Have the discharge nurse carefully go over each prescription with you, before taking them home. Make sure that your name is accurately spelled, that your address is correct. Check the name and dose of your medication to make sure it is accurate. Check the number of pills, and the written instructions to make sure they are clear and accurate. Make sure that you are given enough medication to last until your next medication refill appointment. Taking Medication: Take medication as prescribed. When it comes to controlled substances, taking less pills or less frequently than prescribed is permitted and encouraged. Never take more pills than instructed. Never take the medication more frequently than prescribed.  Inform other Doctors: Always inform, all of your healthcare providers, of all the medications you take. Pain  Medication from other Providers: You are not allowed to accept any additional pain medication from any other Doctor or Healthcare provider. There are two exceptions to this rule. (see below) In the event that you require additional pain medication, you are responsible for notifying us, as stated below. Cough Medicine: Often these contain an opioid, such as codeine or hydrocodone. Never accept or take cough medicine containing these opioids if you are already taking an opioid* medication. The combination may cause respiratory failure and death. Medication Agreement: You are responsible for carefully reading and following our Medication Agreement. This must be signed before receiving any prescriptions from our practice. Safely store a copy of your signed Agreement. Violations to the Agreement will result in no further prescriptions. (Additional copies of our Medication Agreement are available upon request.) Laws, Rules, & Regulations: All patients are expected to follow all [address] and [employer], [employer], Rules, [place] [employer]. Ignorance of the Laws does not constitute a valid excuse.  Illegal drugs and Controlled Substances: The use of illegal substances (including, but not limited to marijuana and its derivatives) and/or the illegal use of any controlled substances is strictly prohibited. Violation of this rule may result in the immediate and permanent discontinuation of any and all prescriptions being written by our practice. The use of any illegal substances is prohibited. Adopted CDC guidelines & recommendations: Target dosing levels will be at or below 60 MME/day. Use of benzodiazepines** is not recommended. Urine Drug testing: Patients taking controlled substances will be required to provide a urine sample upon request. Do not void before coming to your medication management appointments.  Hold emptying your bladder until you are admitted. The admitting nurse will inform you if a sample is required. Our  practice reserves the right to call you at any time to provide a sample. Once receiving the call, you have 24 hours to comply with request. Not providing a sample upon request may result in termination of medication therapy.  Exceptions: There are only two exceptions to the rule of not receiving pain medications from other Healthcare Providers. Exception #1 (Emergencies): In the event of an emergency (i.e.: accident requiring emergency care), you are allowed to receive additional pain medication. However, you are responsible for: As soon as you are able, call our office [phone], at any time of the day or night, and leave a message stating your name, the date and nature of the emergency, and the name and dose of the medication prescribed. In the event that your call is answered by a member of our staff, make sure to document and save the date, time, and the name of the person that took your information.  Exception #2 (Planned Surgery): In the event that you are scheduled by another doctor or dentist to have any type of surgery or procedure, you are allowed (for a period no longer than 30 days), to receive additional pain medication, for the acute post-op pain. However, in this case, you are responsible for picking up a copy of our "Post-op Pain Management for Surgeons" handout, and giving it to your surgeon or dentist. This document is available at our office, and does not require an appointment to obtain it. Simply go to our office during business hours (Monday-Thursday from 8:00 AM to 4:00 PM) (Friday 8:00 AM to 12:00 Noon) or if you have a scheduled appointment with [place], prior to your surgery, and ask for it by name. In addition, you are responsible for: calling our office [phone], at any time of the day or night, and leaving a message stating your name, name of your surgeon, type of surgery, and date of procedure or surgery. Failure to comply with your responsibilities may result in termination  of therapy involving the controlled substances.  Consequences:  Non-compliance with the above rules may result in permanent discontinuation of medication prescription therapy. All patients receiving any type of controlled substance is expected to comply with the above patient responsibilities. Not doing so may result in permanent discontinuation of medication prescription therapy. Medication Agreement Violation. Following the above rules, including your responsibilities will help you in avoiding a Medication Agreement Violation ("Breaking your Pain Medication Contract").  *Opioid medications include: morphine, codeine, oxycodone, oxymorphone, hydrocodone, hydromorphone, meperidine, tramadol, tapentadol, buprenorphine, fentanyl, methadone. **Benzodiazepine medications include: diazepam (Valium), alprazolam (Xanax), clonazepam (Klonopine), lorazepam (Ativan), clorazepate (Tranxene), chlordiazepoxide (Librium), estazolam (Prosom), oxazepam (Serax), temazepam (Restoril), triazolam (Halcion) (Last updated: 06/23/2023) ______________________________________________________________________      ______________________________________________________________________    Medication Recommendations and Reminders  Applies to: All patients receiving prescriptions (written and/or electronic).  Medication Rules & Regulations: You are responsible for reading, knowing, and following our "Medication Rules" document. These exist for your safety and that of others. They are not flexible and neither are we. Dismissing or ignoring them is an act of "non-compliance" that may result in complete and irreversible termination of such medication therapy. For safety reasons, "non-compliance" will not be tolerated. As with the [place] fundamental legal principle of "ignorance of the law is no defense", we will accept no excuses for not having read and knowing the content of documents provided to you by  our practice.  Pharmacy of  record:  Definition: This is the pharmacy where your electronic prescriptions will be sent.  We do not endorse any particular pharmacy. It is up to you and your insurance to decide what pharmacy to use.  We do not restrict you in your choice of pharmacy. However, once we write for your prescriptions, we will NOT be re-sending more prescriptions to fix restricted supply problems created by your pharmacy, or your insurance.  The pharmacy listed in the electronic medical record should be the one where you want electronic prescriptions to be sent. If you choose to change pharmacy, simply notify our nursing staff. Changes will be made only during your regular appointments and not over the phone.  Recommendations: Keep all of your pain medications in a safe place, under lock and key, even if you live alone. We will NOT replace lost, stolen, or damaged medication. We do not accept "Police Reports" as proof of medications having been stolen. After you fill your prescription, take 1 week's worth of pills and put them away in a safe place. You should keep a separate, properly labeled bottle for this purpose. The remainder should be kept in the original bottle. Use this as your primary supply, until it runs out. Once it's gone, then you know that you have 1 week's worth of medicine, and it is time to come in for a prescription refill. If you do this correctly, it is unlikely that you will ever run out of medicine. To make sure that the above recommendation works, it is very important that you make sure your medication refill appointments are scheduled at least 1 week before you run out of medicine. To do this in an effective manner, make sure that you do not leave the office without scheduling your next medication management appointment. Always ask the nursing staff to show you in your prescription , when your medication will be running out. Then arrange for the receptionist to get you a return appointment, at least  7 days before you run out of medicine. Do not wait until you have 1 or 2 pills left, to come in. This is very poor planning and does not take into consideration that we may need to cancel appointments due to bad weather, sickness, or emergencies affecting our staff. DO NOT ACCEPT A "Partial Fill": If for any reason your pharmacy does not have enough pills/tablets to completely fill or refill your prescription, do not allow for a "partial fill". The law allows the pharmacy to complete that prescription within 72 hours, without requiring a new prescription. If they do not fill the rest of your prescription within those 72 hours, you will need a separate prescription to fill the remaining amount, which we will NOT provide. If the reason for the partial fill is your insurance, you will need to talk to the pharmacist about payment alternatives for the remaining tablets, but again, DO NOT ACCEPT A PARTIAL FILL, unless you can trust your pharmacist to obtain the remainder of the pills within 72 hours.  Prescription refills and/or changes in medication(s):  Prescription refills, and/or changes in dose or medication, will be conducted only during scheduled medication management appointments. (Applies to both, written and electronic prescriptions.) No refills on procedure days. No medication will be changed or started on procedure days. No changes, adjustments, and/or refills will be conducted on a procedure day. Doing so will interfere with the diagnostic portion of the procedure. No phone refills. No medications will be "  called into the pharmacy". No Fax refills. No weekend refills. No Holliday refills. No after hours refills.  Remember:  Business hours are:  Monday to Thursday 8:00 AM to 4:00 PM Provider's Schedule: Delano Metz, MD - Appointments are:  Medication management: Monday and Wednesday 8:00 AM to 4:00 PM Procedure day: Tuesday and Thursday 7:30 AM to 4:00 PM Edward Jolly, MD -  Appointments are:  Medication management: Tuesday and Thursday 8:00 AM to 4:00 PM Procedure day: Monday and Wednesday 7:30 AM to 4:00 PM (Last update: 06/23/2022) ______________________________________________________________________      ______________________________________________________________________    Drug Holidays  What is a "Drug Holiday"? Drug Holiday: is the name given to the process of slowly tapering down and temporarily stopping the pain medication for the purpose of decreasing or eliminating tolerance to the drug.  Benefits Improved effectiveness Decreased required effective dose Improved pain control End dependence on high dose therapy Decrease cost of therapy Uncovering "opioid-induced hyperalgesia". (OIH)  What is "opioid hyperalgesia"? It is a paradoxical increase in pain caused by exposure to opioids. Stopping the opioid pain medication, contrary to the expected, it actually decreases or completely eliminates the pain. Ref.: "A comprehensive review of opioid-induced hyperalgesia". Donney Rankins, et.al. Pain Physician. 2011 Mar-Apr;14(2):145-61.  What is tolerance? Tolerance: the progressive loss of effectiveness of a pain medicine due to repetitive use. A common problem of opioid pain medications.  How long should a "Drug Holiday" last? Effectiveness depends on the patient staying off all opioid pain medicines for a minimum of 14 consecutive days. (2 weeks) During this time the patient should not be taking any other opioid analgesic medication.  How about just taking less of the medicine? Does not work. Will not accomplish goal of eliminating the excess receptors.  How about switching to a different pain medicine? (AKA. "Opioid rotation") Does not work. Creates the illusion of effectiveness by taking advantage of inaccurate equivalent dose calculations between different opioids. -This "technique" was promoted by studies funded by [employer], such  as [employer], creators of "OxyContin".  Can I stop the medicine "cold [place]"? We do not recommend it. You should always coordinate with your prescribing physician to make the transition as smoothly as possible. Avoid stopping the medicine abruptly without consulting. We recommend a "slow taper".  What is a slow taper? Taper: refers to the gradual decrease in dose.   How do I stop/taper the dose? Slowly. Decrease the daily amount of pills that you take by one (1) pill every seven (7) days. This is called a "slow downward taper". Example: if you normally take four (4) pills per day, drop it to three (3) pills per day for seven (7) days, then to two (2) pills per day for seven (7) days, then to one (1) per day for seven (7) days, and then stop the medicine. The 14 day "Drug Holiday" starts on the first day without medicine.   Will I experience withdrawals? Unlikely with a slow taper.  What triggers withdrawals? Withdrawals are triggered by the sudden/abrupt stop of high dose opioids. Withdrawals can be avoided by slowly decreasing the dose over a prolonged period of time.  What are withdrawals? Symptoms associated with sudden/abrupt reduction/stopping of high-dose, long-term use of pain medication. Withdrawal are seldom seen on low dose therapy, or patients rarely taking opioid medication.  Early Withdrawal Symptoms may include: Agitation Anxiety Muscle aches Increased tearing Insomnia Runny nose Sweating Yawning  Late symptoms may include: Abdominal cramping Diarrhea Dilated pupils Goose bumps Nausea Vomiting  When could I see withdrawals? Onset: 8-24 hours after last use for most opioids. 12-48 hours for long-acting opioids (i.e.: methadone)  How long could they last? Duration: 4-10 days for most opioids. 14-21 days for long-acting opioids (i.e.: methadone)  What will happen after I complete my "Drug Holiday"? The need and indications for the opioid analgesic will be  reviewed before restarting the medication. Dose requirements will likely decrease and the dose will need to be adjusted accordingly.   (Last update: 06/23/2023) ______________________________________________________________________      ______________________________________________________________________     Naloxone Nasal Spray  Why am I receiving this medication? [place] STOP ACT requires that all patients taking high dose opioids or at risk of opioids respiratory depression, be prescribed an opioid reversal agent, such as Naloxone (AKA: Narcan).  What is this medication? NALOXONE (nal OX one) treats opioid overdose, which causes slow or shallow breathing, severe drowsiness, or trouble staying awake. Call emergency services after using this medication. You may need additional treatment. Naloxone works by reversing the effects of opioids. It belongs to a group of medications called opioid blockers.  COMMON BRAND NAME(S): Kloxxado, Narcan  What should I tell my care team before I take this medication? They need to know if you have any of these conditions: Heart disease Substance use disorder An unusual or allergic reaction to naloxone, other medications, foods, dyes, or preservatives Pregnant or trying to get pregnant Breast-feeding  When to use this medication? This medication is to be used for the treatment of respiratory depression (less than 8 breaths per minute) secondary to opioid overdose.   How to use this medication? This medication is for use in the nose. Lay the person on their back. Support their neck with your hand and allow the head to tilt back before giving the medication. The nasal spray should be given into 1 nostril. After giving the medication, move the person onto their side. Do not remove or test the nasal spray until ready to use. Get emergency medical help right away after giving the first dose of this medication, even if the person wakes up. You  should be familiar with how to recognize the signs and symptoms of a narcotic overdose. If more doses are needed, give the additional dose in the other nostril. Talk to your care team about the use of this medication in children. While this medication may be prescribed for children as young as newborns for selected conditions, precautions do apply.  Naloxone Overdosage: If you think you have taken too much of this medicine contact a poison control center or emergency room at once.  NOTE: This medicine is only for you. Do not share this medicine with others.  What if I miss a dose? This does not apply.  What may interact with this medication? This is only used during an emergency. No interactions are expected during emergency use. This list may not describe all possible interactions. Give your health care provider a list of all the medicines, herbs, non-prescription drugs, or dietary supplements you use. Also tell them if you smoke, drink alcohol, or use illegal drugs. Some items may interact with your medicine.  What should I watch for while using this medication? Keep this medication ready for use in the case of an opioid overdose. Make sure that you have the phone number of your care team and local hospital ready. You may need to have additional doses of this medication. Each nasal spray contains a single dose. Some emergencies may require  additional doses. After use, bring the treated person to the nearest hospital or call 911. Make sure the treating care team knows that the person has received a dose of this medication. You will receive additional instructions on what to do during and after use of this medication before an emergency occurs.  What side effects may I notice from receiving this medication? Side effects that you should report to your care team as soon as possible: Allergic reactions--skin rash, itching, hives, swelling of the face, lips, tongue, or throat Side effects that  usually do not require medical attention (report these to your care team if they continue or are bothersome): Constipation Dryness or irritation inside the nose Headache Increase in blood pressure Muscle spasms Stuffy nose Toothache This list may not describe all possible side effects. Call your doctor for medical advice about side effects. You may report side effects to FDA at 1-800-FDA-1088.  Where should I keep my medication? Because this is an emergency medication, you should keep it with you at all times.  Keep out of the reach of children and pets. Store between 20 and 25 degrees C (68 and 77 degrees F). Do not freeze. Throw away any unused medication after the expiration date. Keep in original box until ready to use.  NOTE: This sheet is a summary. It may not cover all possible information. If you have questions about this medicine, talk to your doctor, pharmacist, or health care provider.   2023 Elsevier/Gold Standard (2021-04-25 00:00:00)  ______________________________________________________________________

## 2024-05-08 NOTE — Progress Notes (Unsigned)
 PROVIDER NOTE: Interpretation of information contained herein should be left to medically-trained personnel. Specific patient instructions are provided elsewhere under Patient Instructions section of medical record. This document was created in part using STT-dictation technology, any transcriptional errors that may result from this process are unintentional.  Patient: Kendra Nelson Type: Established DOB: 05-30-1961 MRN: [id number] PCP: Joshua Santana CROME, NP  Service: Procedure DOS: 05/09/2024 Setting: Ambulatory Location: Ambulatory outpatient facility Delivery: Face-to-face Provider: Eric DELENA Como, MD Specialty: Interventional Pain Management Specialty designation: 09 Location: Outpatient facility Ref. Prov.: Joshua Santana CROME, NP       Interventional Therapy   Primary Reason for Visit: Interventional Pain Management Treatment. CC: No chief complaint on file.  Procedure:          Type: Management of Intrathecal Drug Delivery System (IDDS) - Reservoir Refill (04009). No rate change.  Indications: 1. Chronic pain syndrome   2. Chronic low back pain (1ry area of Pain) (Bilateral) (R>L) w/ sciatica (Bilateral)   3. Chronic lower extremity pain (2ry area of Pain) (Bilateral) (R>L)   4. 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 Lumbar facet syndrome (Bilateral) (R>L)   6. Chronic lumbosacral radiculopathy (L5) (Left)   7. Degeneration of intervertebral disc of lumbar region with discogenic back pain and lower extremity pain   8. Presence of neurostimulator   9. Presence of intrathecal pump   10. Pharmacologic therapy   11. Long term prescription opiate use   12. Encounter for medication management   13. Encounter for adjustment and management of infusion pump   14. Chronic use of opiate for therapeutic purpose   15. Encounter for chronic pain management    Pain Assessment: Self-Reported Pain Score:  /10             Reported level is compatible with  observation.          Intrathecal Drug Delivery System (IDDS)  Pump Device:  Manufacturer: Medtronic Model: Synchromed II Model No.: [id number] Serial No.: [id number] H Delivery Route: Intrathecal Type: Programmable  Volume (mL): 40 mL reservoir Priming Volume: n/a  Calibration Constant: 120.0  MRI compatibility: Conditional   Implant Details:  Date: 05/09/2019 Implanter: Mindi Mt, MD  Contact Information: [facility] Last Revision/Replacement: 05/09/2019 Estimated Replacement Date: May/2027  Implant Site: Abdominal Laterality: Left  Catheter: Manufacturer: Medtronic Model:  (two piece) Model No.: [id number]  Serial No.: n/a  Implanted Length (cm): 38.1  Catheter Volume (mL): 0.214  Tip Location (Level): T6-7 Canal Access Site: n/a  Drug content:  Primary Medication Class: Opioid  Medication: PF-Fentanyl   Concentration: 1,000 mcg/mL   Secondary Medication Class: Local Anesthetic  Medication: PF-Bupivacaine   Concentration: 20.0 mg/mL   Tertiary Medication Class: none   PA parameters (PCA-mode):  Mode: Off (Inactive)  Programming:  Type: Simple continuous.  Medication, Concentration, Infusion Program, & Delivery Rate: For up-to-date details please see most recent scanned programming printout.   Changes:  Medication Change: None at this point Rate Change: No change in rate  Reported side-effects or adverse reactions: None reported  Effectiveness: Described as relatively effective, allowing for increase in activities of daily living (ADL) Clinically meaningful improvement in function (CMIF): Sustained CMIF goals met  Plan: Pump refill today   Pharmacotherapy Assessment  Opioid Analgesic: Morphine  (MS Contin ) 30 mg PO BID (60 mg/day of morphine ) (60 MME/day) +  PF-(intrathecal)-Fentanyl  (13.4 mcg/hr)  changed from Oxycodone  IR 10 mg 4x/day (40 mg/day of oxycodone ) (60 MME/day), due to national shortage  MME/day: 60 MME/day (oral) + 32.16  MME/day (intrathecal) = 93.16 MME/day (Aprox. 1.16 MME/day/kg).   Monitoring: [place] PMP: PDMP reviewed during this encounter.       Pharmacotherapy: No side-effects or adverse reactions reported. Compliance: No problems identified. Effectiveness: Clinically acceptable. Plan: Refer to POC.  UDS:  Summary  Date Value Ref Range Status  02/17/2023 Note  Final    Comment:    ==================================================================== [employer] Select 13 (MW) ==================================================================== Test                             Result       Flag       Units  Drug Present and Declared for Prescription Verification   Morphine                        >6849        EXPECTED   ng/mg creat   Normorphine                    440          EXPECTED   ng/mg creat    Potential sources of large amounts of morphine  in the absence of    codeine include administration of morphine  or use of heroin.     Normorphine is an expected metabolite of morphine .    Hydromorphone                   71           EXPECTED   ng/mg creat    Hydromorphone  may be present as a metabolite of morphine ;    concentrations of hydromorphone  rarely exceed 5% of the morphine     concentration when this is the source of hydromorphone .    Fentanyl                        27           EXPECTED   ng/mg creat   Norfentanyl                    95           EXPECTED   ng/mg creat    Source of fentanyl  is a scheduled prescription medication, including    IV, patch, and transmucosal formulations. Norfentanyl is an expected    metabolite of fentanyl .  ==================================================================== Test                      Result    Flag   Units      Ref Range   Creatinine              146              mg/dL      >=79 ==================================================================== Declared Medications:  The flagging and interpretation on this report are based on the   following declared medications.  Unexpected results may arise from  inaccuracies in the declared medications.   **Note: The testing scope of this panel includes these medications:   Fentanyl   Morphine  (MS Contin )   **Note: The testing scope of this panel does not include the  following reported medications:   Atorvastatin  (Lipitor)  Bupivacaine   Carvedilol  (Coreg )  Furosemide  (Lasix )  Lisinopril  (Zestril )  Naloxone  (Narcan )  Vitamin D2 ==================================================================== For clinical consultation, please call [phone]. ====================================================================  No results found for: CBDTHCR, D8THCCBX, D9THCCBX  H&P (Pre-op Assessment):  Ms. Sebree is a 63 y.o. (year old), female patient, seen today for interventional treatment. 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 Ms. Oglesby has a current medication list which includes the following prescription(s): AMBULATORY NON FORMULARY MEDICATION, atorvastatin , carvedilol , fluticasone , furosemide , lisinopril , morphine , and naloxone . Her primarily concern today is the No chief complaint on file.  Initial Vital Signs:  Pulse/HCG Rate:    Temp:   Resp:   BP:   SpO2:    BMI: Estimated body mass index is 30.11 kg/m as calculated from the following:   Height as of 02/10/24: 5' 3 (1.6 m).   Weight as of 02/10/24: 170 lb (77.1 kg).  Risk Assessment: Allergies: Reviewed. She is allergic to benadryl [diphenhydramine hcl], bee venom, and dexamethasone .  Allergy Precautions: None  required Coagulopathies: Reviewed. None identified.  Blood-thinner therapy: None at this time Active Infection(s): Reviewed. None identified. Ms. Dahlem is afebrile  Site Confirmation: Ms. Renfro was asked to confirm the procedure and laterality before marking the site Procedure checklist: Completed Consent: Before the procedure and under the influence of no sedative(s), amnesic(s), or anxiolytics, the patient was informed of the treatment options, risks and possible complications. To fulfill our ethical and legal obligations, as recommended by the American Medical Association's Code of Ethics, I have informed the patient of my clinical impression; the nature and purpose of the treatment or procedure; the risks, benefits, and possible complications of the intervention; the alternatives, including doing nothing; the risk(s) and benefit(s) of the alternative treatment(s) or procedure(s); and the risk(s) and benefit(s) of doing nothing.  Ms. Halberg was provided with information about the general risks and possible complications associated with most interventional procedures. These include, but are not limited to: failure to achieve desired goals, infection, bleeding, organ or nerve damage, allergic reactions, paralysis, and/or death.  In addition, she was informed of those risks and possible complications associated to this particular procedure, which include, but are not limited to: damage to the implant; failure to decrease pain; local, systemic, or serious CNS infections, intraspinal abscess with possible cord compression and paralysis, or life-threatening such as meningitis; bleeding; organ damage; nerve injury or damage with subsequent sensory, motor, and/or autonomic system dysfunction, resulting in transient or permanent pain, numbness, and/or weakness of one or several areas of the body; allergic reactions, either minor or major life-threatening, such as anaphylactic or anaphylactoid  reactions.  Furthermore, Ms. Morganti was informed of those risks and complications associated with the medications. These include, but are not limited to: allergic reactions (i.e.: anaphylactic or anaphylactoid reactions); endorphine suppression; bradycardia and/or hypotension; water retention and/or peripheral vascular relaxation leading to lower extremity edema and possible stasis ulcers; respiratory depression and/or shortness of breath; decreased metabolic rate leading to weight gain; swelling or edema; medication-induced neural toxicity; particulate matter embolism and blood vessel occlusion with resultant organ, and/or nervous system infarction; and/or intrathecal granuloma formation with possible spinal cord compression and permanent paralysis.  Before refilling the pump Ms. Rudie was informed that some of the medications used in the devise may not be FDA approved for such use and therefore it constitutes an off-label use of the medications.  Finally, she was informed  that Medicine is not an exact science; therefore, there is also the possibility of unforeseen or unpredictable risks and/or possible complications that may result in a catastrophic outcome. The patient indicated having understood very clearly. We have given the patient no guarantees and we have made no promises. Enough time was given to the patient to ask questions, all of which were answered to the patient's satisfaction. Ms. Sant has indicated that she wanted to continue with the procedure. Attestation: I, the ordering provider, attest that I have discussed with the patient the benefits, risks, side-effects, alternatives, likelihood of achieving goals, and potential problems during recovery for the procedure that I have provided informed consent. Date  Time: {CHL [facility]-PAIN TIME CHOICES:[id number]}  Pre-Procedure Preparation:  Monitoring: As per clinic protocol. Respiration, ETCO2, SpO2, BP, heart rate and rhythm monitor  placed and checked for adequate function Safety Precautions: Patient was assessed for positional comfort and pressure points before starting the procedure. Time-out: I initiated and conducted the Time-out before starting the procedure, as per protocol. The patient was asked to participate by confirming the accuracy of the Time Out information. Verification of the correct person, site, and procedure were performed and confirmed by me, the nursing staff, and the patient. Time-out conducted as per Joint Commission's Universal Protocol (UP.01.01.01). Time:   Start Time:   hrs.  Description of Procedure:          Position: Supine Target Area: Central-port of intrathecal pump. Approach: Anterior, 90 degree angle approach. Area Prepped: Entire Area around the pump implant. ChloraPrep (2% chlorhexidine  gluconate and 70% isopropyl alcohol) Safety Precautions: Aspiration looking for blood return was conducted prior to all injections. At no point did we inject any substances, as a needle was being advanced. No attempts were made at seeking any paresthesias. Safe injection practices and needle disposal techniques used. Medications properly checked for expiration dates. SDV (single dose vial) medications used. Description of the Procedure: Protocol guidelines were followed. Two nurses trained to do implant refills were present during the entire procedure. The refill medication was checked by both healthcare providers as well as the patient. The patient was included in the Time-out to verify the medication. The patient was placed in position. The pump was identified. The area was prepped in the usual manner. The sterile template was positioned over the pump, making sure the side-port location matched that of the pump. Both, the pump and the template were held for stability. The needle provided in the Medtronic Kit was then introduced thru the center of the template and into the central port. The pump content  was aspirated and discarded volume documented. The new medication was slowly infused into the pump, thru the filter, making sure to avoid overpressure of the device. The needle was then removed and the area cleansed, making sure to leave some of the prepping solution back to take advantage of its long term bactericidal properties. The pump was interrogated and programmed to reflect the correct medication, volume, and dosage. The program was printed and taken to the physician for approval. Once checked and signed by the physician, a copy was provided to the patient and another scanned into the EMR.  There were no vitals filed for this visit.  Start Time:   hrs. End Time:   hrs. Materials & Medications: Medtronic Refill Kit Medication(s): Please see chart orders for details.  Type of Imaging Technique: None used Indication(s): N/A Exposure Time: No patient exposure Contrast: None used. Fluoroscopic Guidance: N/A Ultrasound Guidance: N/A Interpretation: N/A  Antibiotic Prophylaxis:   Anti-infectives (From admission, onward)    None      Indication(s): None identified  Post-operative Assessment:  Post-procedure Vital Signs:  Pulse/HCG Rate:    Temp:   Resp:   BP:   SpO2:    EBL: None  Complications: No immediate post-treatment complications observed by team, or reported by patient.  Note: The patient tolerated the entire procedure well. A repeat set of vitals were taken after the procedure and the patient was kept under observation following institutional policy, for this type of procedure. Post-procedural neurological assessment was performed, showing return to baseline, prior to discharge. The patient was provided with post-procedure discharge instructions, including a section on how to identify potential problems. Should any problems arise concerning this procedure, the patient was given instructions to immediately contact us , at any time, without hesitation. In any case, we plan  to contact the patient by telephone for a follow-up status report regarding this interventional procedure.  Comments:  No additional relevant information.  Plan of Care (POC)  Orders:  No orders of the defined types were placed in this encounter.    Opioid Analgesic: Morphine  (MS Contin ) 30 mg PO BID (60 mg/day of morphine ) (60 MME/day) +  PF-(intrathecal)-Fentanyl  (13.4 mcg/hr)  changed from Oxycodone  IR 10 mg 4x/day (40 mg/day of oxycodone ) (60 MME/day), due to national shortage MME/day: 60 MME/day (oral) + 32.16 MME/day (intrathecal) = 93.16 MME/day (Aprox. 1.16 MME/day/kg).    Medications ordered for procedure: No orders of the defined types were placed in this encounter.  Medications administered: Erminio A. Porzio had no medications administered during this visit.  See the medical record for exact dosing, route, and time of administration.    Interventional Therapies  Risk Factors  Considerations:   ELIQUIS  Anticoagulation (Stop: 3 days  Restart: 6 hours) Severe anxiety and agoraphobia  Poor candidate for RFA secondary to Lumbar hardware   Planned  Pending:   Intrathecal pump refill as per pump programming.   Under consideration:   Diagnostic left IA knee joint inj  Diagnostic bilateral L2 TFESI  Diagnostic left genicular NB  Possible left genicular nerve RFA  Diagnostic caudal ESI + epidurogram  Possible Racz procedure    Completed:   Palliative intrathecal pump refills and adjustments  Intrathecal pump insertion by Dr. Fairy Levels [facility]) (05/10/2012)  Intrathecal pump replacement by Dr. Deward Fabian [facility]) (05/09/2019)  Spinal cord stimulator replacement by Dr. Fairy Levels [facility]) (06/09/2016)    Therapeutic  Palliative (PRN) options:   Therapeutic/palliative intrathecal pump  management (analysis and refill)       Follow-up plan:   No follow-ups on file.     Recent Visits Date Type Provider Dept   02/10/24 Procedure visit Tanya Glisson, MD [facility]  Showing recent visits within past 90 days and meeting all other requirements Future Appointments Date Type Provider Dept  05/09/24 Appointment Tanya Glisson, MD [facility]  Showing future appointments within next 90 days and meeting all other requirements   Disposition: Discharge home  Discharge (Date  Time): 05/09/2024;   hrs.   Primary Care Physician: Joshua Santana CROME, NP Location: [facility] Note by: Glisson DELENA Tanya, MD (TTS technology used. I apologize for any typographical errors that were not detected and corrected.) Date: 05/09/2024; Time: 6:28 AM  Disclaimer:  Medicine is not an Visual merchandiser. The only guarantee in medicine is that nothing is guaranteed. It is important to note that the decision to  proceed with this intervention was based on the information collected from the patient. The Data and conclusions were drawn from the patient's questionnaire, the interview, and the physical examination. Because the information was provided in large part by the patient, it cannot be guaranteed that it has not been purposely or unconsciously manipulated. Every effort has been made to obtain as much relevant data as possible for this evaluation. It is important to note that the conclusions that lead to this procedure are derived in large part from the available data. Always take into account that the treatment will also be dependent on availability of resources and existing treatment guidelines, considered by other Pain Management Practitioners as being common knowledge and practice, at the time of the intervention. For Medico-Legal purposes, it is also important to point out that variation in procedural techniques and pharmacological choices are the acceptable norm. The indications, contraindications, technique, and results of the above procedure should only be interpreted and judged  by a Board-Certified Interventional Pain Specialist with extensive familiarity and expertise in the same exact procedure and technique.

## 2024-05-09 ENCOUNTER — Ambulatory Visit: Attending: Pain Medicine | Admitting: Pain Medicine

## 2024-05-09 ENCOUNTER — Encounter: Payer: Self-pay | Admitting: Pain Medicine

## 2024-05-09 VITALS — BP 159/63 | HR 62 | Temp 97.4°F | Resp 16 | Ht 63.0 in | Wt 170.0 lb

## 2024-05-09 DIAGNOSIS — Z978 Presence of other specified devices: Secondary | ICD-10-CM | POA: Diagnosis present

## 2024-05-09 DIAGNOSIS — Z79899 Other long term (current) drug therapy: Secondary | ICD-10-CM | POA: Diagnosis present

## 2024-05-09 DIAGNOSIS — M5417 Radiculopathy, lumbosacral region: Secondary | ICD-10-CM | POA: Diagnosis present

## 2024-05-09 DIAGNOSIS — M961 Postlaminectomy syndrome, not elsewhere classified: Secondary | ICD-10-CM | POA: Insufficient documentation

## 2024-05-09 DIAGNOSIS — M47816 Spondylosis without myelopathy or radiculopathy, lumbar region: Secondary | ICD-10-CM | POA: Insufficient documentation

## 2024-05-09 DIAGNOSIS — G8929 Other chronic pain: Secondary | ICD-10-CM | POA: Diagnosis present

## 2024-05-09 DIAGNOSIS — M51362 Other intervertebral disc degeneration, lumbar region with discogenic back pain and lower extremity pain: Secondary | ICD-10-CM | POA: Insufficient documentation

## 2024-05-09 DIAGNOSIS — G894 Chronic pain syndrome: Secondary | ICD-10-CM | POA: Insufficient documentation

## 2024-05-09 DIAGNOSIS — M79605 Pain in left leg: Secondary | ICD-10-CM | POA: Insufficient documentation

## 2024-05-09 DIAGNOSIS — Z451 Encounter for adjustment and management of infusion pump: Secondary | ICD-10-CM | POA: Insufficient documentation

## 2024-05-09 DIAGNOSIS — M79604 Pain in right leg: Secondary | ICD-10-CM | POA: Insufficient documentation

## 2024-05-09 DIAGNOSIS — Z79891 Long term (current) use of opiate analgesic: Secondary | ICD-10-CM | POA: Insufficient documentation

## 2024-05-09 DIAGNOSIS — M5441 Lumbago with sciatica, right side: Secondary | ICD-10-CM | POA: Diagnosis present

## 2024-05-09 DIAGNOSIS — M5442 Lumbago with sciatica, left side: Secondary | ICD-10-CM | POA: Insufficient documentation

## 2024-05-09 DIAGNOSIS — Z9682 Presence of neurostimulator: Secondary | ICD-10-CM | POA: Diagnosis present

## 2024-05-09 MED ORDER — MORPHINE SULFATE ER 30 MG PO TBCR: 30.0000 mg | EXTENDED_RELEASE_TABLET | Freq: Two times a day (BID) | ORAL | 0 refills | Status: DC

## 2024-05-09 MED FILL — Medication: INTRATHECAL | Qty: 1 | Status: AC

## 2024-05-09 NOTE — Progress Notes (Deleted)
 Safety precautions to be maintained throughout the outpatient stay will include: orient to surroundings, keep bed in low position, maintain call bell within reach at all times, provide assistance with transfer out of bed and ambulation.

## 2024-05-09 NOTE — Progress Notes (Signed)
 Nursing Pain Medication Assessment:  Safety precautions to be maintained throughout the outpatient stay will include: orient to surroundings, keep bed in low position, maintain call bell within reach at all times, provide assistance with transfer out of bed and ambulation.  Medication Inspection Compliance: Pill count conducted under aseptic conditions, in front of the patient. Neither the pills nor the bottle was removed from the patient's sight at any time. Once count was completed pills were immediately returned to the patient in their original bottle.  Medication: Morphine  ER (MSContin) Pill/Patch Count: 1 of 60 pills/patches remain Pill/Patch Appearance: Markings consistent with prescribed medication Bottle Appearance: Standard pharmacy container. Clearly labeled. Filled Date: 08 / 11 / 2025 Last Medication intake:  Today

## 2024-05-10 ENCOUNTER — Telehealth: Payer: Self-pay

## 2024-05-10 NOTE — Telephone Encounter (Signed)
 Post procedure follow up.  Number is not in service.

## 2024-06-09 ENCOUNTER — Other Ambulatory Visit: Payer: Self-pay | Admitting: Cardiology

## 2024-06-12 ENCOUNTER — Other Ambulatory Visit: Payer: Self-pay | Admitting: Cardiology

## 2024-06-19 ENCOUNTER — Telehealth: Payer: Self-pay | Admitting: Pain Medicine

## 2024-06-19 NOTE — Telephone Encounter (Signed)
 Patient got a message on her pump saying the battery was going to run out soon. Please advise patient

## 2024-06-27 ENCOUNTER — Telehealth: Payer: Self-pay | Admitting: Pain Medicine

## 2024-06-27 NOTE — Telephone Encounter (Signed)
 Patient got message on her pain pump saying battery needed to be replaced. I ask them to call Med[employer]. Do we need to set up an appt for her to come in?

## 2024-06-27 NOTE — Telephone Encounter (Signed)
 I tried to call patient but the number was not in service.  According to her last pump refill report, the replacement date is not until may 2027.  Patient should notify [employer] to look into that.

## 2024-06-28 NOTE — Telephone Encounter (Signed)
 Spoke with patient's husband. He said they called [employer], told that it alarms one year before the battery is due to be changed.  No further action indicated.

## 2024-07-11 ENCOUNTER — Telehealth: Payer: Self-pay | Admitting: *Deleted

## 2024-07-11 NOTE — Telephone Encounter (Signed)
 Spoke with patient's husband and they have resolved the battery issue, it was the stimulator, they have spoken with [employer] and she has 1 year until the battery needs to be changed. They have an appt 08/01/24 with Dr Naveira for pump refill.

## 2024-07-18 ENCOUNTER — Other Ambulatory Visit: Payer: Self-pay

## 2024-07-18 MED ORDER — PAIN MANAGEMENT IT PUMP REFILL: 1.0000 | Freq: Once | INTRATHECAL | 0 refills | Status: AC

## 2024-08-01 ENCOUNTER — Ambulatory Visit (HOSPITAL_BASED_OUTPATIENT_CLINIC_OR_DEPARTMENT_OTHER): Admitting: Pain Medicine

## 2024-08-01 DIAGNOSIS — Z538 Procedure and treatment not carried out for other reasons: Secondary | ICD-10-CM

## 2024-08-01 NOTE — Patient Instructions (Signed)
 ______________________________________________________________________    Opioid Pain Medication Update  To: All patients taking opioid pain medications. (I.e.: hydrocodone , hydromorphone , oxycodone , oxymorphone, morphine , codeine, methadone, tapentadol, tramadol, buprenorphine, fentanyl , etc.)  Re: Updated review of side effects and adverse reactions of opioid analgesics, as well as new information about long term effects of this class of medications.  Direct risks of long-term opioid therapy are not limited to opioid addiction and overdose. Potential medical risks include serious fractures, breathing problems during sleep, hyperalgesia, immunosuppression, chronic constipation, bowel obstruction, myocardial infarction, and tooth decay secondary to xerostomia.  Unpredictable adverse effects that can occur even if you take your medication correctly: Cognitive impairment, respiratory depression, and death. Most people think that if they take their medication correctly, and as instructed, that they will be safe. Nothing could be farther from the truth. In reality, a significant amount of recorded deaths associated with the use of opioids has occurred in individuals that had taken the medication for a long time, and were taking their medication correctly. The following are examples of how this can happen: Patient taking his/her medication for a long time, as instructed, without any side effects, is given a certain antibiotic or another unrelated medication, which in turn triggers a Drug-to-drug interaction leading to disorientation, cognitive impairment, impaired reflexes, respiratory depression or an untoward event leading to serious bodily harm or injury, including death.  Patient taking his/her medication for a long time, as instructed, without any side effects, develops an acute impairment of liver and/or kidney function. This will lead to a rapid inability of the body to breakdown and eliminate  their pain medication, which will result in effects similar to an overdose, but with the same medicine and dose that they had always taken. This again may lead to disorientation, cognitive impairment, impaired reflexes, respiratory depression or an untoward event leading to serious bodily harm or injury, including death.  A similar problem will occur with patients as they grow older and their liver and kidney function begins to decrease as part of the aging process.  Background information: Historically, the original case for using long-term opioid therapy to treat chronic noncancer pain was based on safety assumptions that subsequent experience has called into question. In 1996, the American Pain Society and the American Academy of Pain Medicine issued a consensus statement supporting long-term opioid therapy. This statement acknowledged the dangers of opioid prescribing but concluded that the risk for addiction was low; respiratory depression induced by opioids was short-lived, occurred mainly in opioid-naive patients, and was antagonized by pain; tolerance was not a common problem; and efforts to control diversion should not constrain opioid prescribing. This has now proven to be wrong. Experience regarding the risks for opioid addiction, misuse, and overdose in community practice has failed to support these assumptions.  According to the Centers for Disease Control and Prevention, fatal overdoses involving opioid analgesics have increased sharply over the past decade. Currently, more than 96,700 people die from drug overdoses every year. Opioids are a factor in 7 out of every 10 overdose deaths. Deaths from drug overdose have surpassed motor vehicle accidents as the leading cause of death for individuals between the ages of 57 and 67.  Clinical data suggest that neuroendocrine dysfunction may be very common in both men and women, potentially causing hypogonadism, erectile dysfunction, infertility,  decreased libido, osteoporosis, and depression. Recent studies linked higher opioid dose to increased opioid-related mortality. Controlled observational studies reported that long-term opioid therapy may be associated with increased risk for cardiovascular events. Subsequent  meta-analysis concluded that the safety of long-term opioid therapy in elderly patients has not been proven.   Side Effects and adverse reactions: Common side effects: Drowsiness (sedation). Dizziness. Nausea and vomiting. Constipation. Physical dependence -- Dependence often manifests with withdrawal symptoms when opioids are discontinued or decreased. Tolerance -- As you take repeated doses of opioids, you require increased medication to experience the same effect of pain relief. Respiratory depression -- This can occur in healthy people, especially with higher doses. However, people with COPD, asthma or other lung conditions may be even more susceptible to fatal respiratory impairment.  Uncommon side effects: An increased sensitivity to feeling pain and extreme response to pain (hyperalgesia). Chronic use of opioids can lead to this. Delayed gastric emptying (the process by which the contents of your stomach are moved into your small intestine). Muscle rigidity. Immune system and hormonal dysfunction. Quick, involuntary muscle jerks (myoclonus). Arrhythmia. Itchy skin (pruritus). Dry mouth (xerostomia).  Long-term side effects: Chronic constipation. Sleep-disordered breathing (SDB). Increased risk of bone fractures. Hypothalamic-pituitary-adrenal dysregulation. Increased risk of overdose.  RISKS: Respiratory depression and death: Opioids increase the risk of respiratory depression and death.  Drug-to-drug interactions: Opioids are relatively contraindicated in combination with benzodiazepines, sleep inducers, and other central nervous system depressants. Other classes of medications (i.e.: certain antibiotics  and even over-the-counter medications) may also trigger or induce respiratory depression in some patients.  Medical conditions: Patients with pre-existing respiratory problems are at higher risk of respiratory failure and/or depression when in combination with opioid analgesics. Opioids are relatively contraindicated in some medical conditions such as central sleep apnea.   Fractures and Falls:  Opioids increase the risk and incidence of falls. This is of particular importance in elderly patients.  Endocrine System:  Long-term administration is associated with endocrine abnormalities (endocrinopathies). (Also known as Opioid-induced Endocrinopathy) Influences on both the hypothalamic-pituitary-adrenal axis?and the hypothalamic-pituitary-gonadal axis have been demonstrated with consequent hypogonadism and adrenal insufficiency in both sexes. Hypogonadism and decreased levels of dehydroepiandrosterone sulfate have been reported in men and women. Endocrine effects include: Amenorrhoea in women (abnormal absence of menstruation) Reduced libido in both sexes Decreased sexual function Erectile dysfunction in men Hypogonadisms (decreased testicular function with shrinkage of testicles) Infertility Depression and fatigue Loss of muscle mass Anxiety Depression Immune suppression Hyperalgesia Weight gain Anemia Osteoporosis Patients (particularly women of childbearing age) should avoid opioids. There is insufficient evidence to recommend routine monitoring of asymptomatic patients taking opioids in the long-term for hormonal deficiencies.  Immune System: Human studies have demonstrated that opioids have an immunomodulating effect. These effects are mediated via opioid receptors both on immune effector cells and in the central nervous system. Opioids have been demonstrated to have adverse effects on antimicrobial response and anti-tumour surveillance. Buprenorphine has been demonstrated to have  no impact on immune function.  Opioid Induced Hyperalgesia: Human studies have demonstrated that prolonged use of opioids can lead to a state of abnormal pain sensitivity, sometimes called opioid induced hyperalgesia (OIH). Opioid induced hyperalgesia is not usually seen in the absence of tolerance to opioid analgesia. Clinically, hyperalgesia may be diagnosed if the patient on long-term opioid therapy presents with increased pain. This might be qualitatively and anatomically distinct from pain related to disease progression or to breakthrough pain resulting from development of opioid tolerance. Pain associated with hyperalgesia tends to be more diffuse than the pre-existing pain and less defined in quality. Management of opioid induced hyperalgesia requires opioid dose reduction.  Cancer: Chronic opioid therapy has been associated with an increased risk  of cancer among noncancer patients with chronic pain. This association was more evident in chronic strong opioid users. Chronic opioid consumption causes significant pathological changes in the small intestine and colon. Epidemiological studies have found that there is a link between opium  dependence and initiation of gastrointestinal cancers. Cancer is the second leading cause of death after cardiovascular disease. Chronic use of opioids can cause multiple conditions such as GERD, immunosuppression and renal damage as well as carcinogenic effects, which are associated with the incidence of cancers.   Mortality: Long-term opioid use has been associated with increased mortality among patients with chronic non-cancer pain (CNCP).  Prescription of long-acting opioids for chronic noncancer pain was associated with a significantly increased risk of all-cause mortality, including deaths from causes other than overdose.  Reference: Von Korff M, Kolodny A, Deyo RA, Chou R. Long-term opioid therapy reconsidered. Ann Intern Med. 2011 Sep 6;155(5):325-8. doi:  10.7326/[id number]. PMID: 78106373; PMCID: [id number]. Kit JINNY Laurence CINDERELLA Pearley JINNY, Hayward RA, Dunn KM, Jordan KP. Risk of adverse events in patients prescribed long-term opioids: A cohort study in the [place] Clinical Practice Research [employer]. Eur J Pain. 2019 May;23(5):908-922. doi: 10.1002/ejp.1357. Epub 2019 Jan 31. PMID: 69379883. Colameco S, Coren JS, Ciervo CA. Continuous opioid treatment for chronic noncancer pain: a time for moderation in prescribing. Postgrad Med. 2009 Jul;121(4):61-6. doi: 10.3810/pgm.2009.07.2032. PMID: 80358728. Gigi JONELLE Shlomo MILUS Levern IVER Conny RN, Amelia SD, Blazina I, Lonell DASEN, Bougatsos C, Deyo RA. The effectiveness and risks of long-term opioid therapy for chronic pain: a systematic review for a [employer] of [employer]ways to [employer]. Ann Intern Med. 2015 Feb 17;162(4):276-86. doi: 10.7326/M14-2559. PMID: 74418742. Rory CHRISTELLA Laurence [facility], [place] DM. NCHS Data Brief No. 22. [place]: Centers for Disease Control and Prevention; 2009. Sep, Increase in Fatal Poisonings Involving Opioid Analgesics in the [place] , 1999-2006. Song IA, Choi HR, Oh TK. Long-term opioid use and mortality in patients with chronic non-cancer pain: Ten-year follow-up study in [place] from 2010 through 2019. EClinicalMedicine. 2022 Jul 18;51:101558. doi: 10.1016/j.eclinm.2022.898441. PMID: 64124182; PMCID: EFR0695089. Huser, W., Schubert, T., Vogelmann, T. et al. All-cause mortality in patients with long-term opioid therapy compared with non-opioid analgesics for chronic non-cancer pain: a database study. BMC Med 18, 162 (2020). http://lester.info/ Rashidian H, Zendehdel K, Kamangar F, Malekzadeh R, Haghdoost AA. An Ecological Study of the Association between Opiate Use and Incidence of Cancers. Addict Health. 2016 Fall;8(4):252-260. PMID: 71180443; PMCID: EFR4445194.  Our Goal: Our goal is to control your pain with means other  than the use of opioid pain medications.  Our Recommendation: Talk to your physician about coming off of these medications. We can assist you with the tapering down and stopping these medicines. Based on the new information, even if you cannot completely stop the medication, a decrease in the dose may be associated with a lesser risk. Ask for other means of controlling the pain. Decrease or eliminate those factors that significantly contribute to your pain such as smoking, obesity, and a diet heavily tilted towards inflammatory nutrients.  Last Updated: 03/08/2023   ______________________________________________________________________       ______________________________________________________________________    National Pain Medication Shortage  The [place] is experiencing worsening drug shortages. These have had a negative widespread effect on patient care and treatment. Not expected to improve any time soon. Predicted to last past 2029.   Drug shortage list (generic names) Oxycodone  IR Oxycodone /APAP Oxymorphone IR Hydromorphone  Hydrocodone /APAP Morphine   Where is the problem?  Manufacturing and supply level.  Will this shortage  affect you?  Only if you take any of the above pain medications.  How? You may be unable to fill your prescription.  Your pharmacist may offer a partial fill of your prescription. (Warning: Do not accept partial fills.) Prescriptions partially filled cannot be transferred to another pharmacy. Read our Medication Rules and Regulation. Depending on how much medicine you are dependent on, you may experience withdrawals when unable to get the medication.  Recommendations: Consider ending your dependence on opioid pain medications. Ask your pain specialist to assist you with the process. Consider switching to a medication currently not in shortage, such as Buprenorphine. Talk to your pain specialist about this option. Consider decreasing your pain  medication requirements by managing tolerance thru Drug Holidays. This may help minimize withdrawals, should you run out of medicine. Control your pain thru the use of non-pharmacological interventional therapies.   Your prescriber: Prescribers cannot be blamed for shortages. Medication manufacturing and supply issues cannot be fixed by the prescriber.   NOTE: The prescriber is not responsible for supplying the medication, or solving supply issues. Work with your pharmacist to solve it. The patient is responsible for the decision to take or continue taking the medication and for identifying and securing a legal supply source. By law, supplying the medication is the job and responsibility of the pharmacy. The prescriber is responsible for the evaluation, monitoring, and prescribing of these medications.   Prescribers will NOT: Re-issue prescriptions that have been partially filled. Re-issue prescriptions already sent to a pharmacy.  Re-send prescriptions to a different pharmacy because yours did not have your medication. Ask pharmacist to order more medicine or transfer the prescription to another pharmacy. (Read below.)  New 2023 regulation: May 01, 2022 Revised Regulation Allows DEA-Registered Pharmacies to Transfer Electronic Prescriptions at a Patient's Request DEA Headquarters Division - Public Information Office Patients now have the ability to request their electronic prescription be transferred to another pharmacy without having to go back to their practitioner to initiate the request. This revised regulation went into effect on Monday, April 27, 2022.     At a patient's request, a DEA-registered retail pharmacy can now transfer an electronic prescription for a controlled substance (schedules II-V) to another DEA-registered retail pharmacy. Prior to this change, patients would have to go through their practitioner to cancel their prescription and have it re-issued to a different  pharmacy. The process was taxing and time consuming for both patients and practitioners.    The Drug Enforcement Administration [facility]) published its intent to revise the process for transferring electronic prescriptions on July 19, 2020.  The final rule was published in the federal register on March 26, 2022 and went into effect 30 days later.  Under the final rule, a prescription can only be transferred once between pharmacies, and only if allowed under existing state or other applicable law. The prescription must remain in its electronic form; may not be altered in any way; and the transfer must be communicated directly between two licensed pharmacists. It's important to note, any authorized refills transfer with the original prescription, which means the entire prescription will be filled at the same pharmacy.  Reference: [email or web address].is [facility] website announcement)  [email or web address] [employer])   [employer] / Vol. 88, No. 143 / Thursday, March 26, 2022 / Rules and Regulations DEPARTMENT OF JUSTICE  Drug Enforcement Administration  21 CFR Part 1306  [Docket No. [id number]]  RIN [id number] Transfer of Electronic Prescriptions for Schedules II-V Controlled Substances Between  Pharmacies for Initial Filling  ______________________________________________________________________       ______________________________________________________________________    Transfer of Pain Medication between Pharmacies  Re: 2023 DEA Clarification on existing regulation  Published on DEA Website: May 01, 2022  Title: Revised Regulation Allows DEA-Registered Pharmacies to Electrical Engineer Prescriptions at a Patient's Request DEA Headquarters Division - [employer]  Patients now have the ability to  request their electronic prescription be transferred to another pharmacy without having to go back to their practitioner to initiate the request. This revised regulation went into effect on Monday, April 27, 2022.     At a patient's request, a DEA-registered retail pharmacy can now transfer an electronic prescription for a controlled substance (schedules II-V) to another DEA-registered retail pharmacy. Prior to this change, patients would have to go through their practitioner to cancel their prescription and have it re-issued to a different pharmacy. The process was taxing and time consuming for both patients and practitioners.    The Drug Enforcement Administration [facility]) published its intent to revise the process for transferring electronic prescriptions on July 19, 2020.  The final rule was published in the federal register on March 26, 2022 and went into effect 30 days later.  Under the final rule, a prescription can only be transferred once between pharmacies, and only if allowed under existing state or other applicable law. The prescription must remain in its electronic form; may not be altered in any way; and the transfer must be communicated directly between two licensed pharmacists. It's important to note, any authorized refills transfer with the original prescription, which means the entire prescription will be filled at the same pharmacy.    REFERENCES: 1. DEA website announcement hugehand.is  2. Department of Justice website  [email or web address]  3. DEPARTMENT OF JUSTICE Drug Enforcement Administration 21 CFR Part 1306 [Docket No. [id number]] RIN [id number] Transfer of Electronic Prescriptions for Schedules II-V Controlled Substances Between Pharmacies for Initial  Filling  ______________________________________________________________________       ______________________________________________________________________    Medication Rules  Purpose: To inform patients, and their family members, of our medication rules and regulations.  Applies to: All patients receiving prescriptions from our practice (written or electronic).  Pharmacy of record: This is the pharmacy where your electronic prescriptions will be sent. Make sure we have the correct one.  Electronic prescriptions: In compliance with the [place]  Strengthen Opioid Misuse Prevention (STOP) Act of 2017 (Session Law 2017-74/H243), effective August 31, 2018, all controlled substances must be electronically prescribed. Written prescriptions, faxing, or calling prescriptions to a pharmacy will no longer be done.  Prescription refills: These will be provided only during in-person appointments. No medications will be renewed without a face-to-face evaluation with your provider. Applies to all prescriptions.  NOTE: The following applies primarily to controlled substances (Opioid* Pain Medications).   Type of encounter (visit): For patients receiving controlled substances, face-to-face visits are required. (Not an option and not up to the patient.)  Patient's Responsibilities: Pain Pills: Bring all pain pills to every appointment (except for procedure appointments). Pill counts are required.  Pill Bottles: Bring pills in original pharmacy bottle. Bring bottle, even if empty. Always bring the bottle of the most recent fill.  Medication refills: You are responsible for knowing and keeping track of what medications you are taking and when is it that you will need a refill. The day before your appointment: write a list of all prescriptions that need to be refilled. The day of the appointment: give the list to  the admitting nurse. Prescriptions will be written only during appointments. No  prescriptions will be written on procedure days. If you forget a medication: it will not be Called in, Faxed, or electronically sent. You will need to get another appointment to get these prescribed. No early refills. Do not call asking to have your prescription filled early. Partial  or short prescriptions: Occasionally your pharmacy may not have enough pills to fill your prescription.  NEVER ACCEPT a partial fill or a prescription that is short of the total amount of pills that you were prescribed.  With controlled substances the law allows 72 hours for the pharmacy to complete the prescription.  If the prescription is not completed within 72 hours, the pharmacist will require a new prescription to be written. This means that you will be short on your medicine and we WILL NOT send another prescription to complete your original prescription.  Instead, request the pharmacy to send a carrier to a nearby branch to get enough medication to provide you with your full prescription. Prescription Accuracy: You are responsible for carefully inspecting your prescriptions before leaving our office. Have the discharge nurse carefully go over each prescription with you, before taking them home. Make sure that your name is accurately spelled, that your address is correct. Check the name and dose of your medication to make sure it is accurate. Check the number of pills, and the written instructions to make sure they are clear and accurate. Make sure that you are given enough medication to last until your next medication refill appointment. Taking Medication: Take medication as prescribed. When it comes to controlled substances, taking less pills or less frequently than prescribed is permitted and encouraged. Never take more pills than instructed. Never take the medication more frequently than prescribed.  Inform other Doctors: Always inform, all of your healthcare providers, of all the medications you take. Pain  Medication from other Providers: You are not allowed to accept any additional pain medication from any other Doctor or Healthcare provider. There are two exceptions to this rule. (see below) In the event that you require additional pain medication, you are responsible for notifying us , as stated below. Cough Medicine: Often these contain an opioid, such as codeine or hydrocodone . Never accept or take cough medicine containing these opioids if you are already taking an opioid* medication. The combination may cause respiratory failure and death. Medication Agreement: You are responsible for carefully reading and following our Medication Agreement. This must be signed before receiving any prescriptions from our practice. Safely store a copy of your signed Agreement. Violations to the Agreement will result in no further prescriptions. (Additional copies of our Medication Agreement are available upon request.) Laws, Rules, & Regulations: All patients are expected to follow all [address] and [employer], Rules, [place] [employer]. Ignorance of the Laws does not constitute a valid excuse.  Illegal drugs and Controlled Substances: The use of illegal substances (including, but not limited to marijuana and its derivatives) and/or the illegal use of any controlled substances is strictly prohibited. Violation of this rule may result in the immediate and permanent discontinuation of any and all prescriptions being written by our practice. The use of any illegal substances is prohibited. Adopted CDC guidelines & recommendations: Target dosing levels will be at or below 60 MME/day. Use of benzodiazepines** is not recommended. Urine Drug testing: Patients taking controlled substances will be required to provide a urine sample upon request. Do not void before coming to your medication management appointments.  Hold emptying your bladder until you are admitted. The admitting nurse will inform you if a sample is required. Our  practice reserves the right to call you at any time to provide a sample. Once receiving the call, you have 24 hours to comply with request. Not providing a sample upon request may result in termination of medication therapy.  Exceptions: There are only two exceptions to the rule of not receiving pain medications from other Healthcare Providers. Exception #1 (Emergencies): In the event of an emergency (i.e.: accident requiring emergency care), you are allowed to receive additional pain medication. However, you are responsible for: As soon as you are able, call our office [phone], at any time of the day or night, and leave a message stating your name, the date and nature of the emergency, and the name and dose of the medication prescribed. In the event that your call is answered by a member of our staff, make sure to document and save the date, time, and the name of the person that took your information.  Exception #2 (Planned Surgery): In the event that you are scheduled by another doctor or dentist to have any type of surgery or procedure, you are allowed (for a period no longer than 30 days), to receive additional pain medication, for the acute post-op pain. However, in this case, you are responsible for picking up a copy of our Post-op Pain Management for Surgeons handout, and giving it to your surgeon or dentist. This document is available at our office, and does not require an appointment to obtain it. Simply go to our office during business hours (Monday-Thursday from 8:00 AM to 4:00 PM) (Friday 8:00 AM to 12:00 Noon) or if you have a scheduled appointment with us , prior to your surgery, and ask for it by name. In addition, you are responsible for: calling our office [phone], at any time of the day or night, and leaving a message stating your name, name of your surgeon, type of surgery, and date of procedure or surgery. Failure to comply with your responsibilities may result in termination  of therapy involving the controlled substances.  Consequences:  Non-compliance with the above rules may result in permanent discontinuation of medication prescription therapy. All patients receiving any type of controlled substance is expected to comply with the above patient responsibilities. Not doing so may result in permanent discontinuation of medication prescription therapy. Medication Agreement Violation. Following the above rules, including your responsibilities will help you in avoiding a Medication Agreement Violation ("Breaking your Pain Medication Contract").  *Opioid medications include: morphine , codeine, oxycodone , oxymorphone, hydrocodone , hydromorphone , meperidine , tramadol, tapentadol, buprenorphine, fentanyl , methadone. **Benzodiazepine medications include: diazepam  (Valium ), alprazolam (Xanax), clonazepam (Klonopine), lorazepam (Ativan), clorazepate (Tranxene), chlordiazepoxide (Librium), estazolam (Prosom), oxazepam (Serax), temazepam (Restoril), triazolam (Halcion) (Last updated: 06/23/2023) ______________________________________________________________________     ______________________________________________________________________    Medication Recommendations and Reminders  Applies to: All patients receiving prescriptions (written and/or electronic).  Medication Rules & Regulations: You are responsible for reading, knowing, and following our Medication Rules document. These exist for your safety and that of others. They are not flexible and neither are we. Dismissing or ignoring them is an act of non-compliance that may result in complete and irreversible termination of such medication therapy. For safety reasons, non-compliance will not be tolerated. As with the [place] fundamental legal principle of ignorance of the law is no defense, we will accept no excuses for not having read and knowing the content of documents provided to you by our  practice.  Pharmacy of  record:  Definition: This is the pharmacy where your electronic prescriptions will be sent.  We do not endorse any particular pharmacy. It is up to you and your insurance to decide what pharmacy to use.  We do not restrict you in your choice of pharmacy. However, once we write for your prescriptions, we will NOT be re-sending more prescriptions to fix restricted supply problems created by your pharmacy, or your insurance.  The pharmacy listed in the electronic medical record should be the one where you want electronic prescriptions to be sent. If you choose to change pharmacy, simply notify our nursing staff. Changes will be made only during your regular appointments and not over the phone.  Recommendations: Keep all of your pain medications in a safe place, under lock and key, even if you live alone. We will NOT replace lost, stolen, or damaged medication. We do not accept Police Reports as proof of medications having been stolen. After you fill your prescription, take 1 week's worth of pills and put them away in a safe place. You should keep a separate, properly labeled bottle for this purpose. The remainder should be kept in the original bottle. Use this as your primary supply, until it runs out. Once it's gone, then you know that you have 1 week's worth of medicine, and it is time to come in for a prescription refill. If you do this correctly, it is unlikely that you will ever run out of medicine. To make sure that the above recommendation works, it is very important that you make sure your medication refill appointments are scheduled at least 1 week before you run out of medicine. To do this in an effective manner, make sure that you do not leave the office without scheduling your next medication management appointment. Always ask the nursing staff to show you in your prescription , when your medication will be running out. Then arrange for the receptionist to get you a return appointment, at least  7 days before you run out of medicine. Do not wait until you have 1 or 2 pills left, to come in. This is very poor planning and does not take into consideration that we may need to cancel appointments due to bad weather, sickness, or emergencies affecting our staff. DO NOT ACCEPT A Partial Fill: If for any reason your pharmacy does not have enough pills/tablets to completely fill or refill your prescription, do not allow for a partial fill. The law allows the pharmacy to complete that prescription within 72 hours, without requiring a new prescription. If they do not fill the rest of your prescription within those 72 hours, you will need a separate prescription to fill the remaining amount, which we will NOT provide. If the reason for the partial fill is your insurance, you will need to talk to the pharmacist about payment alternatives for the remaining tablets, but again, DO NOT ACCEPT A PARTIAL FILL, unless you can trust your pharmacist to obtain the remainder of the pills within 72 hours.  Prescription refills and/or changes in medication(s):  Prescription refills, and/or changes in dose or medication, will be conducted only during scheduled medication management appointments. (Applies to both, written and electronic prescriptions.) No refills on procedure days. No medication will be changed or started on procedure days. No changes, adjustments, and/or refills will be conducted on a procedure day. Doing so will interfere with the diagnostic portion of the procedure. No phone refills. No medications will be called  into the pharmacy. No Fax refills. No weekend refills. No Holliday refills. No after hours refills.  Remember:  Business hours are:  Monday to Thursday 8:00 AM to 4:00 PM Provider's Schedule: Eric Como, MD - Appointments are:  Medication management: Monday and Wednesday 8:00 AM to 4:00 PM Procedure day: Tuesday and Thursday 7:30 AM to 4:00 PM Wallie Sherry, MD -  Appointments are:  Medication management: Tuesday and Thursday 8:00 AM to 4:00 PM Procedure day: Monday and Wednesday 7:30 AM to 4:00 PM (Last update: 06/23/2022) ______________________________________________________________________     ______________________________________________________________________    Drug Holidays  What is a Drug Holiday? Drug Holiday: is the name given to the process of slowly tapering down and temporarily stopping the pain medication for the purpose of decreasing or eliminating tolerance to the drug.  Benefits Improved effectiveness Decreased required effective dose Improved pain control End dependence on high dose therapy Decrease cost of therapy Uncovering opioid-induced hyperalgesia. (OIH)  What is opioid hyperalgesia? It is a paradoxical increase in pain caused by exposure to opioids. Stopping the opioid pain medication, contrary to the expected, it actually decreases or completely eliminates the pain. Ref.: A comprehensive review of opioid-induced hyperalgesia. Marcos Ruth, et.al. Pain Physician. 2011 Mar-Apr;14(2):145-61.  What is tolerance? Tolerance: the progressive loss of effectiveness of a pain medicine due to repetitive use. A common problem of opioid pain medications.  How long should a Drug Holiday last? Effectiveness depends on the patient staying off all opioid pain medicines for a minimum of 14 consecutive days. (2 weeks) During this time the patient should not be taking any other opioid analgesic medication.  How about just taking less of the medicine? Does not work. Will not accomplish goal of eliminating the excess receptors.  How about switching to a different pain medicine? (AKA. Opioid rotation) Does not work. Creates the illusion of effectiveness by taking advantage of inaccurate equivalent dose calculations between different opioids. -This technique was promoted by studies funded by [employer], such  as [employer], creators of OxyContin .  Can I stop the medicine cold turkey? We do not recommend it. You should always coordinate with your prescribing physician to make the transition as smoothly as possible. Avoid stopping the medicine abruptly without consulting. We recommend a slow taper.  What is a slow taper? Taper: refers to the gradual decrease in dose.   How do I stop/taper the dose? Slowly. Decrease the daily amount of pills that you take by one (1) pill every seven (7) days. This is called a slow downward taper. Example: if you normally take four (4) pills per day, drop it to three (3) pills per day for seven (7) days, then to two (2) pills per day for seven (7) days, then to one (1) per day for seven (7) days, and then stop the medicine. The 14 day Drug Holiday starts on the first day without medicine.   Will I experience withdrawals? Unlikely with a slow taper.  What triggers withdrawals? Withdrawals are triggered by the sudden/abrupt stop of high dose opioids. Withdrawals can be avoided by slowly decreasing the dose over a prolonged period of time.  What are withdrawals? Symptoms associated with sudden/abrupt reduction/stopping of high-dose, long-term use of pain medication. Withdrawal are seldom seen on low dose therapy, or patients rarely taking opioid medication.  Early Withdrawal Symptoms may include: Agitation Anxiety Muscle aches Increased tearing Insomnia Runny nose Sweating Yawning  Late symptoms may include: Abdominal cramping Diarrhea Dilated pupils Goose bumps Nausea Vomiting  When  could I see withdrawals? Onset: 8-24 hours after last use for most opioids. 12-48 hours for long-acting opioids (i.e.: methadone)  How long could they last? Duration: 4-10 days for most opioids. 14-21 days for long-acting opioids (i.e.: methadone)  What will happen after I complete my Drug Holiday? The need and indications for the opioid analgesic will be  reviewed before restarting the medication. Dose requirements will likely decrease and the dose will need to be adjusted accordingly.   (Last update: 06/23/2023) ______________________________________________________________________     ______________________________________________________________________    Is THC legal in [place] ?  No, THC is not legal in [place] , with the exception of very limited medical use and a specific medical CBD oil program. Possession of small amounts is decriminalized, but it is still illegal and can result in fines and misdemeanor charges.  Decriminalization: Possession of half an ounce or less is a misdemeanor, carrying a maximum $200 fine. Medical Use: There are very limited exceptions for medical use, but a broad medical marijuana program has not been passed. CBD Oil: The state has a specific program that allows for the legal possession of CBD oil with less than 3% THC.   Is CBD legal in [place] ?  Yes, CBD is legal in [place] , provided it is derived from hemp and contains no more than 0.3% Delta-9 THC (tetrahydrocannabinol) by dry weight. Marijuana-derived CBD, which has higher THC concentrations, remains illegal for recreational use in the state.  Key Legal Details Hemp vs. Marijuana: The key distinction lies in the [facility] content. Hemp is defined by law as a Cannabis sativa plant with 0.3% or less THC, while anything above this threshold is considered marijuana and is a controlled substance. Source: Only CBD derived from industrial hemp is legal for general purchase and use. No Prescription Needed: You do not need a doctor's prescription or a medical marijuana card to purchase legal hemp-derived CBD products. Age Restriction: While state law has not established a specific age limit, most retailers require purchasers to be at least 61 or 62 years old. Product Types: All forms of hemp-derived CBD products, including oils, tinctures,  edibles (like gummies), and topicals, are legal in [place] , so long as they meet the [facility] concentration limit. Medical Use Exception: [place]  has a very narrow exception for patients with intractable epilepsy, who may use hemp extracts with up to 0.9% THC if registered with the state's program.  Important Considerations Law Enforcement Confusion: Because legal hemp flower looks and smells exactly like illegal marijuana, law enforcement officers may have difficulty distinguishing between the two. Carrying product labels or a certificate of analysis is recommended when in possession of CBD products in public places to help avoid potential issues. Drug Testing: Legal, low-THC CBD products can still result in a positive drug test for THC, especially with frequent or high-dose use. Employers in [place]  are generally not required to accommodate an employee's use of hemp products, even if lawful. Lack of Regulation: The non-prescription CBD market is largely unregulated by the Food and Drug Administration (FDA), meaning product quality and labeling can be inconsistent. Consumers should look for products that have been tested by independent third-party labs to verify their contents.  For legal advice specific to your situation, you should consult with a qualified attorney in [place] .   Is CBD Federally legal in the [place] ?  Yes, hemp-derived CBD is federally legal in the [place] , provided it contains no more than 0.3% Delta-9 THC by dry weight. This was  established by the Agriculture Improvement Act of 2018 (the [employer]), which removed hemp from the federal Controlled Substances Act.  However, the legal landscape has several complexities: Potential Future Changes Recent federal legislation passed in November 2025 as part of a government funding bill may redefine hemp more narrowly and could effectively ban many currently available hemp-derived cannabinoid products (such as Delta-8 THC and  even some full-spectrum CBD products) that contain more than 0.4 milligrams of total THC per container. This potential ban is expected to go into effect in November 2026.   is THC federally legal in the [place] ?  No, THC from cannabis containing more than (0.3%) delta-9 THC is not federally legal in the [place] It remains a Schedule I controlled substance under federal law, making its possession, sale, and use illegal. However, products derived from hemp containing less than (0.3%) THC are federally Solicitor vs. state law: While many states have legalized cannabis for medical or recreational use, federal law still prohibits it. Hemp vs. marijuana: The  THC is classified as hemp and is legal under federal law. FDA-approved products: Some cannabis-derived products containing synthetic THC, like Marinol and Syndros, have been approved by the FDA for prescription use  Is delta-8 [facility] federally legal in the [place] ?  Delta-8 THC currently exists in a federal legal gray area, with its legality largely depending on its source and the specific interpretation of the 2018 [employer]. However, a new federal law passed in November 2025 will make most hemp-derived intoxicating products, including Delta-8 THC, federally illegal starting November 2026.  (Last update: 07/26/2024) ______________________________________________________________________     ______________________________________________________________________     Naloxone  Nasal Spray  Why am I receiving this medication? [place]  STOP ACT requires that all patients taking high dose opioids or at risk of opioids respiratory depression, be prescribed an opioid reversal agent, such as Naloxone  (AKA: Narcan ).  What is this medication? NALOXONE  (nal OX one) treats opioid overdose, which causes slow or shallow breathing, severe drowsiness, or trouble staying awake. Call emergency services after using this medication. You may need additional  treatment. Naloxone  works by reversing the effects of opioids. It belongs to a group of medications called opioid blockers.  COMMON BRAND NAME(S): Kloxxado , Narcan   What should I tell my care team before I take this medication? They need to know if you have any of these conditions: Heart disease Substance use disorder An unusual or allergic reaction to naloxone , other medications, foods, dyes, or preservatives Pregnant or trying to get pregnant Breast-feeding  When to use this medication? This medication is to be used for the treatment of respiratory depression (less than 8 breaths per minute) secondary to opioid overdose.   How to use this medication? This medication is for use in the nose. Lay the person on their back. Support their neck with your hand and allow the head to tilt back before giving the medication. The nasal spray should be given into 1 nostril. After giving the medication, move the person onto their side. Do not remove or test the nasal spray until ready to use. Get emergency medical help right away after giving the first dose of this medication, even if the person wakes up. You should be familiar with how to recognize the signs and symptoms of a narcotic overdose. If more doses are needed, give the additional dose in the other nostril. Talk to your care team about the use of this medication in children. While this medication may be prescribed for children  as young as newborns for selected conditions, precautions do apply.  Naloxone  Overdosage: If you think you have taken too much of this medicine contact a poison control center or emergency room at once.  NOTE: This medicine is only for you. Do not share this medicine with others.  What if I miss a dose? This does not apply.  What may interact with this medication? This is only used during an emergency. No interactions are expected during emergency use. This list may not describe all possible interactions. Give your  health care provider a list of all the medicines, herbs, non-prescription drugs, or dietary supplements you use. Also tell them if you smoke, drink alcohol, or use illegal drugs. Some items may interact with your medicine.  What should I watch for while using this medication? Keep this medication ready for use in the case of an opioid overdose. Make sure that you have the phone number of your care team and local hospital ready. You may need to have additional doses of this medication. Each nasal spray contains a single dose. Some emergencies may require additional doses. After use, bring the treated person to the nearest hospital or call 911. Make sure the treating care team knows that the person has received a dose of this medication. You will receive additional instructions on what to do during and after use of this medication before an emergency occurs.  What side effects may I notice from receiving this medication? Side effects that you should report to your care team as soon as possible: Allergic reactions--skin rash, itching, hives, swelling of the face, lips, tongue, or throat Side effects that usually do not require medical attention (report these to your care team if they continue or are bothersome): Constipation Dryness or irritation inside the nose Headache Increase in blood pressure Muscle spasms Stuffy nose Toothache This list may not describe all possible side effects. Call your doctor for medical advice about side effects. You may report side effects to FDA at 1-800-FDA-1088.  Where should I keep my medication? Because this is an emergency medication, you should keep it with you at all times.  Keep out of the reach of children and pets. Store between 20 and 25 degrees C (68 and 77 degrees F). Do not freeze. Throw away any unused medication after the expiration date. Keep in original box until ready to use.  NOTE: This sheet is a summary. It may not cover all possible  information. If you have questions about this medicine, talk to your doctor, pharmacist, or health care provider.   2023 Elsevier/Gold Standard (2021-04-25 00:00:00)  ______________________________________________________________________

## 2024-08-01 NOTE — Progress Notes (Signed)
 Department: [facility]) Date: 08/01/2024  Event: Canceled/Rescheduled by practice.  Encounter Type: Intrathecal Pump refill.          Advance notice: Patient informed.  Reason: ITP Medication not available for refill.          Note: Patient notified and steps taken to get medication. ITP refill rescheduled

## 2024-08-03 ENCOUNTER — Encounter: Payer: Self-pay | Admitting: Pain Medicine

## 2024-08-03 ENCOUNTER — Ambulatory Visit: Attending: Pain Medicine | Admitting: Pain Medicine

## 2024-08-03 VITALS — BP 173/73 | HR 68 | Temp 97.5°F | Resp 18 | Ht 63.0 in | Wt 170.0 lb

## 2024-08-03 DIAGNOSIS — Z79891 Long term (current) use of opiate analgesic: Secondary | ICD-10-CM | POA: Insufficient documentation

## 2024-08-03 DIAGNOSIS — M79605 Pain in left leg: Secondary | ICD-10-CM | POA: Insufficient documentation

## 2024-08-03 DIAGNOSIS — G894 Chronic pain syndrome: Secondary | ICD-10-CM | POA: Diagnosis not present

## 2024-08-03 DIAGNOSIS — Z978 Presence of other specified devices: Secondary | ICD-10-CM | POA: Insufficient documentation

## 2024-08-03 DIAGNOSIS — M961 Postlaminectomy syndrome, not elsewhere classified: Secondary | ICD-10-CM | POA: Insufficient documentation

## 2024-08-03 DIAGNOSIS — Z451 Encounter for adjustment and management of infusion pump: Secondary | ICD-10-CM | POA: Insufficient documentation

## 2024-08-03 DIAGNOSIS — M79604 Pain in right leg: Secondary | ICD-10-CM | POA: Insufficient documentation

## 2024-08-03 DIAGNOSIS — G8929 Other chronic pain: Secondary | ICD-10-CM | POA: Insufficient documentation

## 2024-08-03 DIAGNOSIS — M5417 Radiculopathy, lumbosacral region: Secondary | ICD-10-CM | POA: Insufficient documentation

## 2024-08-03 DIAGNOSIS — Z9682 Presence of neurostimulator: Secondary | ICD-10-CM | POA: Insufficient documentation

## 2024-08-03 DIAGNOSIS — M5441 Lumbago with sciatica, right side: Secondary | ICD-10-CM | POA: Insufficient documentation

## 2024-08-03 DIAGNOSIS — M51362 Other intervertebral disc degeneration, lumbar region with discogenic back pain and lower extremity pain: Secondary | ICD-10-CM | POA: Insufficient documentation

## 2024-08-03 DIAGNOSIS — M5442 Lumbago with sciatica, left side: Secondary | ICD-10-CM | POA: Insufficient documentation

## 2024-08-03 DIAGNOSIS — Z79899 Other long term (current) drug therapy: Secondary | ICD-10-CM | POA: Insufficient documentation

## 2024-08-03 DIAGNOSIS — M47816 Spondylosis without myelopathy or radiculopathy, lumbar region: Secondary | ICD-10-CM | POA: Insufficient documentation

## 2024-08-03 MED ORDER — NALOXONE HCL 4 MG/0.1ML NA LIQD: 1.0000 | NASAL | 0 refills | Status: AC | PRN

## 2024-08-03 MED ORDER — MORPHINE SULFATE ER 30 MG PO TBCR: 30.0000 mg | EXTENDED_RELEASE_TABLET | Freq: Two times a day (BID) | ORAL | 0 refills | Status: AC

## 2024-08-03 MED FILL — Medication: INTRATHECAL | Qty: 1 | Status: AC

## 2024-08-03 NOTE — Progress Notes (Signed)
 PROVIDER NOTE: Interpretation of information contained herein should be left to medically-trained personnel. Specific patient instructions are provided elsewhere under Patient Instructions section of medical record. This document was created in part using STT-dictation technology, any transcriptional errors that may result from this process are unintentional.  Patient: Kendra Nelson Type: Established DOB: [birth year] MRN: [id number] PCP: Joshua Santana CROME, NP  Service: Procedure DOS: 08/03/2024 Setting: Ambulatory Location: Ambulatory outpatient facility Delivery: Face-to-face Provider: Eric DELENA Como, MD Specialty: Interventional Pain Management Specialty designation: 09 Location: Outpatient facility Ref. Prov.: Joshua Santana CROME, NP       Interventional Therapy   Primary Reason for Visit: Interventional Pain Management Treatment. CC: Back Pain (lower)  Procedure:          Type: Management of Intrathecal Drug Delivery System (IDDS) - Reservoir Refill (04009). No rate change.  Indications: 1. Chronic pain syndrome   2. Chronic low back pain (1ry area of Pain) (Bilateral) (R>L) w/ sciatica (Bilateral)   3. Chronic lower extremity pain (2ry area of Pain) (Bilateral) (R>L)   4. 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 Lumbar facet syndrome (Bilateral) (R>L)   6. Chronic lumbosacral radiculopathy (L5) (Left)   7. Degeneration of intervertebral disc of lumbar region with discogenic back pain and lower extremity pain   8. Presence of neurostimulator   9. Presence of intrathecal pump   10. Pharmacologic therapy   11. Long term prescription opiate use   12. Encounter for medication management   13. Encounter for adjustment and management of infusion pump   14. Chronic use of opiate for therapeutic purpose   15. Encounter for chronic pain management    Pain Assessment: Self-Reported Pain Score: 5 /10             Reported level is compatible with  observation.         Intrathecal Drug Delivery System (IDDS)  Pump Device:  Manufacturer: Medtronic Model: Synchromed II Model No.: [id number] Serial No.: [id number] Delivery Route: Intrathecal Type: Programmable  Volume (mL): 40 mL reservoir Priming Volume: n/a  Calibration Constant: 120.0  MRI compatibility: Conditional   Implant Details:  Date: 05/09/2019 Implanter: Mindi Mt, MD  Contact Information: [facility] Last Revision/Replacement: 05/09/2019 Estimated Replacement Date: May/2027  Implant Site: Abdominal Laterality: Left  Catheter: Manufacturer: Medtronic Model:  (two piece) Model No.: [id number]  Serial No.: n/a  Implanted Length (cm): 38.1  Catheter Volume (mL): 0.214  Tip Location (Level): T6-7 Canal Access Site: n/a  Drug content:  Primary Medication Class: Opioid  Medication: PF-Fentanyl   Concentration: 1,000 mcg/mL   Secondary Medication Class: Local Anesthetic  Medication: PF-Bupivacaine   Concentration: 20.0 mg/mL   Tertiary Medication Class: none   PA parameters (PCA-mode):  Mode: Off (Inactive)  Programming:  Type: Simple continuous.  Medication, Concentration, Infusion Program, & Delivery Rate: For up-to-date details please see most recent scanned programming printout.   Changes:  Medication Change: None at this point Rate Change: No change in rate  Reported side-effects or adverse reactions: None reported  Effectiveness: Described as relatively effective, allowing for increase in activities of daily living (ADL) Clinically meaningful improvement in function (CMIF): Sustained CMIF goals met  Plan: Pump refill today   Pharmacotherapy Assessment  Opioid Analgesic: Morphine  (MS Contin ) 30 mg PO BID (60 mg/day of morphine ) (60 MME/day) +  PF-(intrathecal)-Fentanyl  (13.4 mcg/hr)  changed from Oxycodone  IR 10 mg 4x/day (40 mg/day of oxycodone ) (60 MME/day), due to national shortage MME/day: 60 MME/day (  oral) + 32.16  MME/day (intrathecal) = 93.16 MME/day (Aprox. 1.16 MME/day/kg).   Monitoring: [place] PMP: PDMP reviewed during this encounter.       Pharmacotherapy: No side-effects or adverse reactions reported. Compliance: No problems identified. Effectiveness: Clinically acceptable. Plan: Refer to POC.  UDS:  Summary  Date Value Ref Range Status  02/17/2023 Note  Final    Comment:    ==================================================================== ToxASSURE Select 13 (MW) ==================================================================== Test                             Result       Flag       Units  Drug Present and Declared for Prescription Verification   Morphine                        >6849        EXPECTED   ng/mg creat   Normorphine                    440          EXPECTED   ng/mg creat    Potential sources of large amounts of morphine  in the absence of    codeine include administration of morphine  or use of heroin.     Normorphine is an expected metabolite of morphine .    Hydromorphone                   71           EXPECTED   ng/mg creat    Hydromorphone  may be present as a metabolite of morphine ;    concentrations of hydromorphone  rarely exceed 5% of the morphine     concentration when this is the source of hydromorphone .    Fentanyl                        27           EXPECTED   ng/mg creat   Norfentanyl                    95           EXPECTED   ng/mg creat    Source of fentanyl  is a scheduled prescription medication, including    IV, patch, and transmucosal formulations. Norfentanyl is an expected    metabolite of fentanyl .  ==================================================================== Test                      Result    Flag   Units      Ref Range   Creatinine              146              mg/dL      >=79 ==================================================================== Declared Medications:  The flagging and interpretation on this report are based on the   following declared medications.  Unexpected results may arise from  inaccuracies in the declared medications.   **Note: The testing scope of this panel includes these medications:   Fentanyl   Morphine  (MS Contin )   **Note: The testing scope of this panel does not include the  following reported medications:   Atorvastatin  (Lipitor)  Bupivacaine   Carvedilol  (Coreg )  Furosemide  (Lasix )  Lisinopril  (Zestril )  Naloxone  (Narcan )  Vitamin D2 ==================================================================== For clinical consultation, please call [phone]. ====================================================================    No results found  for: CBDTHCR, D8THCCBX, D9THCCBX  H&P (Pre-op Assessment):  Ms. Raybourn is a 63 y.o. (year old), female patient, seen today for interventional treatment. 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 Ms. Phillis has a current medication list which includes the following prescription(s): AMBULATORY NON FORMULARY MEDICATION, atorvastatin , carvedilol , fluticasone , furosemide , lisinopril , [START ON 08/08/2024] morphine , [START ON 09/07/2024] morphine , [START ON 10/07/2024] morphine , and [START ON 08/08/2024] naloxone . Her primarily concern today is the Back Pain (lower)  Initial Vital Signs:  Pulse/HCG Rate: 68  Temp: (!) 97.5 F (36.4 C) Resp: 18 BP: (!) 173/73 SpO2: 99 %  BMI: Estimated body mass index is 30.11 kg/m as calculated from the following:   Height as of this encounter: 5' 3 (1.6 m).   Weight as of this encounter: 170 lb (77.1 kg).  Risk  Assessment: Allergies: Reviewed. She is allergic to benadryl [diphenhydramine hcl], bee venom, and dexamethasone .  Allergy Precautions: None required Coagulopathies: Reviewed. None identified.  Blood-thinner therapy: None at this time Active Infection(s): Reviewed. None identified. Ms. Glore is afebrile  Site Confirmation: Ms. Holsclaw was asked to confirm the procedure and laterality before marking the site Procedure checklist: Completed Consent: Before the procedure and under the influence of no sedative(s), amnesic(s), or anxiolytics, the patient was informed of the treatment options, risks and possible complications. To fulfill our ethical and legal obligations, as recommended by the American Medical Association's Code of Ethics, I have informed the patient of my clinical impression; the nature and purpose of the treatment or procedure; the risks, benefits, and possible complications of the intervention; the alternatives, including doing nothing; the risk(s) and benefit(s) of the alternative treatment(s) or procedure(s); and the risk(s) and benefit(s) of doing nothing.  Ms. Smead was provided with information about the general risks and possible complications associated with most interventional procedures. These include, but are not limited to: failure to achieve desired goals, infection, bleeding, organ or nerve damage, allergic reactions, paralysis, and/or death.  In addition, she was informed of those risks and possible complications associated to this particular procedure, which include, but are not limited to: damage to the implant; failure to decrease pain; local, systemic, or serious CNS infections, intraspinal abscess with possible cord compression and paralysis, or life-threatening such as meningitis; bleeding; organ damage; nerve injury or damage with subsequent sensory, motor, and/or autonomic system dysfunction, resulting in transient or permanent pain, numbness, and/or weakness  of one or several areas of the body; allergic reactions, either minor or major life-threatening, such as anaphylactic or anaphylactoid reactions.  Furthermore, Ms. Dockstader was informed of those risks and complications associated with the medications. These include, but are not limited to: allergic reactions (i.e.: anaphylactic or anaphylactoid reactions); endorphine suppression; bradycardia and/or hypotension; water retention and/or peripheral vascular relaxation leading to lower extremity edema and possible stasis ulcers; respiratory depression and/or shortness of breath; decreased metabolic rate leading to weight gain; swelling or edema; medication-induced neural toxicity; particulate matter embolism and blood vessel occlusion with resultant organ, and/or nervous system infarction; and/or intrathecal granuloma formation with possible spinal cord compression and permanent paralysis.  Before refilling the pump Ms. Doane was informed that some of the medications used in the devise may not be FDA approved for such use and therefore it  constitutes an off-label use of the medications.  Finally, she was informed that Medicine is not an exact science; therefore, there is also the possibility of unforeseen or unpredictable risks and/or possible complications that may result in a catastrophic outcome. The patient indicated having understood very clearly. We have given the patient no guarantees and we have made no promises. Enough time was given to the patient to ask questions, all of which were answered to the patient's satisfaction. Ms. Lightsey has indicated that she wanted to continue with the procedure. Attestation: I, the ordering provider, attest that I have discussed with the patient the benefits, risks, side-effects, alternatives, likelihood of achieving goals, and potential problems during recovery for the procedure that I have provided informed consent. Date  Time: 08/03/2024  1:44  PM  Pre-Procedure Preparation:  Monitoring: As per clinic protocol. Respiration, ETCO2, SpO2, BP, heart rate and rhythm monitor placed and checked for adequate function Safety Precautions: Patient was assessed for positional comfort and pressure points before starting the procedure. Time-out: I initiated and conducted the Time-out before starting the procedure, as per protocol. The patient was asked to participate by confirming the accuracy of the Time Out information. Verification of the correct person, site, and procedure were performed and confirmed by me, the nursing staff, and the patient. Time-out conducted as per Joint Commission's Universal Protocol (UP.01.01.01). Time: 1350 Start Time: 1351 hrs.  Description of Procedure:          Position: Supine Target Area: Central-port of intrathecal pump. Approach: Anterior, 90 degree angle approach. Area Prepped: Entire Area around the pump implant. ChloraPrep (2% chlorhexidine  gluconate and 70% isopropyl alcohol) Safety Precautions: Aspiration looking for blood return was conducted prior to all injections. At no point did we inject any substances, as a needle was being advanced. No attempts were made at seeking any paresthesias. Safe injection practices and needle disposal techniques used. Medications properly checked for expiration dates. SDV (single dose vial) medications used. Description of the Procedure: Protocol guidelines were followed. Two nurses trained to do implant refills were present during the entire procedure. The refill medication was checked by both healthcare providers as well as the patient. The patient was included in the Time-out to verify the medication. The patient was placed in position. The pump was identified. The area was prepped in the usual manner. The sterile template was positioned over the pump, making sure the side-port location matched that of the pump. Both, the pump and the template were held for stability.  The needle provided in the Medtronic Kit was then introduced thru the center of the template and into the central port. The pump content was aspirated and discarded volume documented. The new medication was slowly infused into the pump, thru the filter, making sure to avoid overpressure of the device. The needle was then removed and the area cleansed, making sure to leave some of the prepping solution back to take advantage of its long term bactericidal properties. The pump was interrogated and programmed to reflect the correct medication, volume, and dosage. The program was printed and taken to the physician for approval. Once checked and signed by the physician, a copy was provided to the patient and another scanned into the EMR.  Vitals:   08/03/24 1344  BP: (!) 173/73  Pulse: 68  Resp: 18  Temp: (!) 97.5 F (36.4 C)  TempSrc: Temporal  SpO2: 99%  Weight: 170 lb (77.1 kg)  Height: 5' 3 (1.6 m)    Start Time: 1351 hrs.  End Time: 1400 hrs. Materials & Medications: Medtronic Refill Kit Medication(s): Please see chart orders for details.  Type of Imaging Technique: None used Indication(s): N/A Exposure Time: No patient exposure Contrast: None used. Fluoroscopic Guidance: N/A Ultrasound Guidance: N/A Interpretation: N/A  Antibiotic Prophylaxis:   Anti-infectives (From admission, onward)    None      Indication(s): None identified  Post-operative Assessment:  Post-procedure Vital Signs:  Pulse/HCG Rate: 68  Temp: (!) 97.5 F (36.4 C) Resp: 18 BP: (!) 173/73 SpO2: 99 %  EBL: None  Complications: No immediate post-treatment complications observed by team, or reported by patient.  Note: The patient tolerated the entire procedure well. A repeat set of vitals were taken after the procedure and the patient was kept under observation following institutional policy, for this type of procedure. Post-procedural neurological assessment was performed, showing return to baseline,  prior to discharge. The patient was provided with post-procedure discharge instructions, including a section on how to identify potential problems. Should any problems arise concerning this procedure, the patient was given instructions to immediately contact us , at any time, without hesitation. In any case, we plan to contact the patient by telephone for a follow-up status report regarding this interventional procedure.  Comments:  No additional relevant information.  Plan of Care (POC)  Orders:  Orders Placed This Encounter  Procedures   PUMP REFILL    Maintain Protocol by having two(2) healthcare providers during procedure and programming.    Scheduling Instructions:     Please refill intrathecal pump today. (08/03/2024)    Where will this procedure be performed?:   [facility]   PUMP REFILL    Whenever possible schedule on a procedure today.    Standing Status:   Future    Expiration Date:   12/02/2024    Scheduling Instructions:     Please schedule intrathecal pump refill based on pump programming. Avoid schedule intervals of more than 120 days (4 months).    Where will this procedure be performed?:   [facility]   ToxASSURE Select 13 (MW), Urine    Volume: 30 ml(s). Minimum 3 ml of urine is needed. Document temperature of fresh sample. Indications: Long term (current) use of opiate analgesic (S20.108)    Release to patient:   Immediate   Informed Consent Details: Physician/Practitioner Attestation; Transcribe to consent form and obtain patient signature    Transcribe to consent form and obtain patient signature.    Physician/Practitioner attestation of informed consent for procedure/surgical case:   I, the physician/practitioner, attest that I have discussed with the patient the benefits, risks, side effects, alternatives, likelihood of achieving goals and potential problems during recovery for the procedure that I have provided informed consent.    Procedure:    Intrathecal pump refill    Physician/Practitioner performing the procedure:   Attending Physician: Jaia Alonge A. Tanya, MD & designated trained staff    Indication/Reason:   Chronic Pain Syndrome (G89.4), presence of an intrathecal pump (Z97.8)     Opioid Analgesic: Morphine  (MS Contin ) 30 mg PO BID (60 mg/day of morphine ) (60 MME/day) +  PF-(intrathecal)-Fentanyl  (13.4 mcg/hr)  changed from Oxycodone  IR 10 mg 4x/day (40 mg/day of oxycodone ) (60 MME/day), due to national shortage MME/day: 60 MME/day (oral) + 32.16 MME/day (intrathecal) = 93.16 MME/day (Aprox. 1.16 MME/day/kg).    Medications ordered for procedure: Meds ordered this encounter  Medications   morphine  (MS CONTIN ) 30 MG 12 hr tablet    Sig: Take 1 tablet (30 mg  total) by mouth every 12 (twelve) hours. Must last 30 days. Do not break tablet    Dispense:  60 tablet    Refill:  0    DO NOT: delete (not duplicate); no partial-fill (will deny script to complete), no refill request (F/U required). DISPENSE: 1 day early if closed on fill date. WARN: No CNS-depressants within 8 hrs of med.   morphine  (MS CONTIN ) 30 MG 12 hr tablet    Sig: Take 1 tablet (30 mg total) by mouth every 12 (twelve) hours. Must last 30 days. Do not break tablet    Dispense:  60 tablet    Refill:  0    DO NOT: delete (not duplicate); no partial-fill (will deny script to complete), no refill request (F/U required). DISPENSE: 1 day early if closed on fill date. WARN: No CNS-depressants within 8 hrs of med.   morphine  (MS CONTIN ) 30 MG 12 hr tablet    Sig: Take 1 tablet (30 mg total) by mouth every 12 (twelve) hours. Must last 30 days. Do not break tablet    Dispense:  60 tablet    Refill:  0    DO NOT: delete (not duplicate); no partial-fill (will deny script to complete), no refill request (F/U required). DISPENSE: 1 day early if closed on fill date. WARN: No CNS-depressants within 8 hrs of med.   naloxone  (NARCAN ) nasal spray 4 mg/0.1 mL    Sig: Place 1  spray into the nose as needed for up to 365 doses (for opioid-induced respiratory depresssion). In case of emergency (overdose), spray once into each nostril. If no response within 3 minutes, repeat application and call 911.    Dispense:  1 each    Refill:  0    Instruct patient in proper use of device.   Medications administered: Erminio A. Guardiola had no medications administered during this visit.  See the medical record for exact dosing, route, and time of administration.    Interventional Therapies  Risk Factors  Considerations:   ELIQUIS  Anticoagulation (Stop: 3 days  Restart: 6 hours) Severe anxiety and agoraphobia  Poor candidate for RFA secondary to Lumbar hardware   Planned  Pending:   Intrathecal pump refill as per pump programming.   Under consideration:   Diagnostic left IA knee joint inj  Diagnostic bilateral L2 TFESI  Diagnostic left genicular NB  Possible left genicular nerve RFA  Diagnostic caudal ESI + epidurogram  Possible Racz procedure    Completed:   Palliative intrathecal pump refills and adjustments  Intrathecal pump insertion by Dr. Fairy Levels [facility]) (05/10/2012)  Intrathecal pump replacement by Dr. Deward Fabian [facility]) (05/09/2019)  Spinal cord stimulator replacement by Dr. Fairy Levels [facility]) (06/09/2016)    Therapeutic  Palliative (PRN) options:   Therapeutic/palliative intrathecal pump  management (analysis and refill)       Follow-up plan:   Return for Pump Refill (Max:66mo).     Recent Visits Date Type Provider Dept  05/09/24 Procedure visit Tanya Glisson, MD [facility]  Showing recent visits within past 90 days and meeting all other requirements Today's Visits Date Type Provider Dept  08/03/24 Procedure visit Tanya Glisson, MD [facility]  Showing today's visits and meeting all other requirements Future Appointments Date Type Provider Dept  10/31/24  Appointment Tanya Glisson, MD [facility]  Showing future appointments within next 90 days and meeting all other requirements   Disposition: Discharge home  Discharge (Date  Time): 08/03/2024; 1407 hrs.  Primary Care Physician: Joshua Santana CROME, NP Location: [facility] Note by: Eric DELENA Como, MD (TTS technology used. I apologize for any typographical errors that were not detected and corrected.) Date: 08/03/2024; Time: 3:41 PM  Disclaimer:  Medicine is not an visual merchandiser. The only guarantee in medicine is that nothing is guaranteed. It is important to note that the decision to proceed with this intervention was based on the information collected from the patient. The Data and conclusions were drawn from the patient's questionnaire, the interview, and the physical examination. Because the information was provided in large part by the patient, it cannot be guaranteed that it has not been purposely or unconsciously manipulated. Every effort has been made to obtain as much relevant data as possible for this evaluation. It is important to note that the conclusions that lead to this procedure are derived in large part from the available data. Always take into account that the treatment will also be dependent on availability of resources and existing treatment guidelines, considered by other Pain Management Practitioners as being common knowledge and practice, at the time of the intervention. For Medico-Legal purposes, it is also important to point out that variation in procedural techniques and pharmacological choices are the acceptable norm. The indications, contraindications, technique, and results of the above procedure should only be interpreted and judged by a Board-Certified Interventional Pain Specialist with extensive familiarity and expertise in the same exact procedure and technique.

## 2024-08-03 NOTE — Patient Instructions (Addendum)
 Narcan  at home ______________________________________________________________________    Opioid Pain Medication Update  To: All patients taking opioid pain medications. (I.e.: hydrocodone , hydromorphone , oxycodone , oxymorphone, morphine , codeine, methadone, tapentadol, tramadol, buprenorphine, fentanyl , etc.)  Re: Updated review of side effects and adverse reactions of opioid analgesics, as well as new information about long term effects of this class of medications.  Direct risks of long-term opioid therapy are not limited to opioid addiction and overdose. Potential medical risks include serious fractures, breathing problems during sleep, hyperalgesia, immunosuppression, chronic constipation, bowel obstruction, myocardial infarction, and tooth decay secondary to xerostomia.  Unpredictable adverse effects that can occur even if you take your medication correctly: Cognitive impairment, respiratory depression, and death. Most people think that if they take their medication correctly, and as instructed, that they will be safe. Nothing could be farther from the truth. In reality, a significant amount of recorded deaths associated with the use of opioids has occurred in individuals that had taken the medication for a long time, and were taking their medication correctly. The following are examples of how this can happen: Patient taking his/her medication for a long time, as instructed, without any side effects, is given a certain antibiotic or another unrelated medication, which in turn triggers a Drug-to-drug interaction leading to disorientation, cognitive impairment, impaired reflexes, respiratory depression or an untoward event leading to serious bodily harm or injury, including death.  Patient taking his/her medication for a long time, as instructed, without any side effects, develops an acute impairment of liver and/or kidney function. This will lead to a rapid inability of the body to breakdown  and eliminate their pain medication, which will result in effects similar to an overdose, but with the same medicine and dose that they had always taken. This again may lead to disorientation, cognitive impairment, impaired reflexes, respiratory depression or an untoward event leading to serious bodily harm or injury, including death.  A similar problem will occur with patients as they grow older and their liver and kidney function begins to decrease as part of the aging process.  Background information: Historically, the original case for using long-term opioid therapy to treat chronic noncancer pain was based on safety assumptions that subsequent experience has called into question. In 1996, the American Pain Society and the American Academy of Pain Medicine issued a consensus statement supporting long-term opioid therapy. This statement acknowledged the dangers of opioid prescribing but concluded that the risk for addiction was low; respiratory depression induced by opioids was short-lived, occurred mainly in opioid-naive patients, and was antagonized by pain; tolerance was not a common problem; and efforts to control diversion should not constrain opioid prescribing. This has now proven to be wrong. Experience regarding the risks for opioid addiction, misuse, and overdose in community practice has failed to support these assumptions.  According to the Centers for Disease Control and Prevention, fatal overdoses involving opioid analgesics have increased sharply over the past decade. Currently, more than 96,700 people die from drug overdoses every year. Opioids are a factor in 7 out of every 10 overdose deaths. Deaths from drug overdose have surpassed motor vehicle accidents as the leading cause of death for individuals between the ages of 84 and 76.  Clinical data suggest that neuroendocrine dysfunction may be very common in both men and women, potentially causing hypogonadism, erectile dysfunction,  infertility, decreased libido, osteoporosis, and depression. Recent studies linked higher opioid dose to increased opioid-related mortality. Controlled observational studies reported that long-term opioid therapy may be associated with increased risk for  cardiovascular events. Subsequent meta-analysis concluded that the safety of long-term opioid therapy in elderly patients has not been proven.   Side Effects and adverse reactions: Common side effects: Drowsiness (sedation). Dizziness. Nausea and vomiting. Constipation. Physical dependence -- Dependence often manifests with withdrawal symptoms when opioids are discontinued or decreased. Tolerance -- As you take repeated doses of opioids, you require increased medication to experience the same effect of pain relief. Respiratory depression -- This can occur in healthy people, especially with higher doses. However, people with COPD, asthma or other lung conditions may be even more susceptible to fatal respiratory impairment.  Uncommon side effects: An increased sensitivity to feeling pain and extreme response to pain (hyperalgesia). Chronic use of opioids can lead to this. Delayed gastric emptying (the process by which the contents of your stomach are moved into your small intestine). Muscle rigidity. Immune system and hormonal dysfunction. Quick, involuntary muscle jerks (myoclonus). Arrhythmia. Itchy skin (pruritus). Dry mouth (xerostomia).  Long-term side effects: Chronic constipation. Sleep-disordered breathing (SDB). Increased risk of bone fractures. Hypothalamic-pituitary-adrenal dysregulation. Increased risk of overdose.  RISKS: Respiratory depression and death: Opioids increase the risk of respiratory depression and death.  Drug-to-drug interactions: Opioids are relatively contraindicated in combination with benzodiazepines, sleep inducers, and other central nervous system depressants. Other classes of medications (i.e.: certain  antibiotics and even over-the-counter medications) may also trigger or induce respiratory depression in some patients.  Medical conditions: Patients with pre-existing respiratory problems are at higher risk of respiratory failure and/or depression when in combination with opioid analgesics. Opioids are relatively contraindicated in some medical conditions such as central sleep apnea.   Fractures and Falls:  Opioids increase the risk and incidence of falls. This is of particular importance in elderly patients.  Endocrine System:  Long-term administration is associated with endocrine abnormalities (endocrinopathies). (Also known as Opioid-induced Endocrinopathy) Influences on both the hypothalamic-pituitary-adrenal axis?and the hypothalamic-pituitary-gonadal axis have been demonstrated with consequent hypogonadism and adrenal insufficiency in both sexes. Hypogonadism and decreased levels of dehydroepiandrosterone sulfate have been reported in men and women. Endocrine effects include: Amenorrhoea in women (abnormal absence of menstruation) Reduced libido in both sexes Decreased sexual function Erectile dysfunction in men Hypogonadisms (decreased testicular function with shrinkage of testicles) Infertility Depression and fatigue Loss of muscle mass Anxiety Depression Immune suppression Hyperalgesia Weight gain Anemia Osteoporosis Patients (particularly women of childbearing age) should avoid opioids. There is insufficient evidence to recommend routine monitoring of asymptomatic patients taking opioids in the long-term for hormonal deficiencies.  Immune System: Human studies have demonstrated that opioids have an immunomodulating effect. These effects are mediated via opioid receptors both on immune effector cells and in the central nervous system. Opioids have been demonstrated to have adverse effects on antimicrobial response and anti-tumour surveillance. Buprenorphine has been  demonstrated to have no impact on immune function.  Opioid Induced Hyperalgesia: Human studies have demonstrated that prolonged use of opioids can lead to a state of abnormal pain sensitivity, sometimes called opioid induced hyperalgesia (OIH). Opioid induced hyperalgesia is not usually seen in the absence of tolerance to opioid analgesia. Clinically, hyperalgesia may be diagnosed if the patient on long-term opioid therapy presents with increased pain. This might be qualitatively and anatomically distinct from pain related to disease progression or to breakthrough pain resulting from development of opioid tolerance. Pain associated with hyperalgesia tends to be more diffuse than the pre-existing pain and less defined in quality. Management of opioid induced hyperalgesia requires opioid dose reduction.  Cancer: Chronic opioid therapy has been associated with  an increased risk of cancer among noncancer patients with chronic pain. This association was more evident in chronic strong opioid users. Chronic opioid consumption causes significant pathological changes in the small intestine and colon. Epidemiological studies have found that there is a link between opium  dependence and initiation of gastrointestinal cancers. Cancer is the second leading cause of death after cardiovascular disease. Chronic use of opioids can cause multiple conditions such as GERD, immunosuppression and renal damage as well as carcinogenic effects, which are associated with the incidence of cancers.   Mortality: Long-term opioid use has been associated with increased mortality among patients with chronic non-cancer pain (CNCP).  Prescription of long-acting opioids for chronic noncancer pain was associated with a significantly increased risk of all-cause mortality, including deaths from causes other than overdose.  Reference: Von Korff M, Kolodny A, Deyo RA, Chou R. Long-term opioid therapy reconsidered. Ann Intern Med. 2011 Sep  6;155(5):325-8. doi: 10.7326/[id number]. PMID: 78106373; PMCID: [id number]. Kit JINNY Laurence CINDERELLA Pearley JINNY, Hayward RA, Dunn KM, Jordan KP. Risk of adverse events in patients prescribed long-term opioids: A cohort study in the [place] Clinical Practice Research Datal[employer]. Eur J Pain. 2019 May;23(5):908-922. doi: 10.1002/ejp.1357. Epub 2019 Jan 31. PMID: 69379883. Colameco S, Coren JS, Ciervo CA. Continuous opioid treatment for chronic noncancer pain: a time for moderation in prescribing. Postgrad Med. 2009 Jul;121(4):61-6. doi: 10.3810/pgm.2009.07.2032. PMID: 80358728. Gigi JONELLE Shlomo MILUS Levern IVER Conny RN, Montrose SD, Blazina I, Lonell DASEN, Bougatsos C, Deyo RA. The effectiveness and risks of long-term opioid therapy for chronic pain: a systematic review for a [employer] of [employer] Pathways to [employer]. Ann Intern Med. 2015 Feb 17;162(4):276-86. doi: 10.7326/M14-2559. PMID: 74418742. Rory CHRISTELLA Laurence [facility], [place] DM. NCHS Data Brief No. 22. [place]: Centers for Disease Control and Prevention; 2009. Sep, Increase in Fatal Poisonings Involving Opioid Analgesics in the [place] , 1999-2006. Song IA, Choi HR, Oh TK. Long-term opioid use and mortality in patients with chronic non-cancer pain: Ten-year follow-up study in [place] from 2010 through 2019. EClinicalMedicine. 2022 Jul 18;51:101558. doi: 10.1016/j.eclinm.2022.898441. PMID: 64124182; PMCID: EFR0695089. Huser, W., Schubert, T., Vogelmann, T. et al. All-cause mortality in patients with long-term opioid therapy compared with non-opioid analgesics for chronic non-cancer pain: a database study. BMC Med 18, 162 (2020). http://lester.info/ Rashidian H, Zendehdel K, Kamangar F, Malekzadeh R, Haghdoost AA. An Ecological Study of the Association between Opiate Use and Incidence of Cancers. Addict Health. 2016 Fall;8(4):252-260. PMID: 71180443; PMCID: EFR4445194.  Our Goal: Our goal is to control your  pain with means other than the use of opioid pain medications.  Our Recommendation: Talk to your physician about coming off of these medications. We can assist you with the tapering down and stopping these medicines. Based on the new information, even if you cannot completely stop the medication, a decrease in the dose may be associated with a lesser risk. Ask for other means of controlling the pain. Decrease or eliminate those factors that significantly contribute to your pain such as smoking, obesity, and a diet heavily tilted towards inflammatory nutrients.  Last Updated: 03/08/2023   ______________________________________________________________________       ______________________________________________________________________    National Pain Medication Shortage  The [place] is experiencing worsening drug shortages. These have had a negative widespread effect on patient care and treatment. Not expected to improve any time soon. Predicted to last past 2029.   Drug shortage list (generic names) Oxycodone  IR Oxycodone /APAP Oxymorphone IR Hydromorphone  Hydrocodone /APAP Morphine   Where is the problem?  Manufacturing and supply level.  Will this shortage affect you?  Only if you take any of the above pain medications.  How? You may be unable to fill your prescription.  Your pharmacist may offer a partial fill of your prescription. (Warning: Do not accept partial fills.) Prescriptions partially filled cannot be transferred to another pharmacy. Read our Medication Rules and Regulation. Depending on how much medicine you are dependent on, you may experience withdrawals when unable to get the medication.  Recommendations: Consider ending your dependence on opioid pain medications. Ask your pain specialist to assist you with the process. Consider switching to a medication currently not in shortage, such as Buprenorphine. Talk to your pain specialist about this option. Consider  decreasing your pain medication requirements by managing tolerance thru Drug Holidays. This may help minimize withdrawals, should you run out of medicine. Control your pain thru the use of non-pharmacological interventional therapies.   Your prescriber: Prescribers cannot be blamed for shortages. Medication manufacturing and supply issues cannot be fixed by the prescriber.   NOTE: The prescriber is not responsible for supplying the medication, or solving supply issues. Work with your pharmacist to solve it. The patient is responsible for the decision to take or continue taking the medication and for identifying and securing a legal supply source. By law, supplying the medication is the job and responsibility of the pharmacy. The prescriber is responsible for the evaluation, monitoring, and prescribing of these medications.   Prescribers will NOT: Re-issue prescriptions that have been partially filled. Re-issue prescriptions already sent to a pharmacy.  Re-send prescriptions to a different pharmacy because yours did not have your medication. Ask pharmacist to order more medicine or transfer the prescription to another pharmacy. (Read below.)  New 2023 regulation: May 01, 2022 Revised Regulation Allows DEA-Registered Pharmacies to Transfer Electronic Prescriptions at a Patient's Request DEA Headquarters Division - Public Information Office Patients now have the ability to request their electronic prescription be transferred to another pharmacy without having to go back to their practitioner to initiate the request. This revised regulation went into effect on Monday, April 27, 2022.     At a patient's request, a DEA-registered retail pharmacy can now transfer an electronic prescription for a controlled substance (schedules II-V) to another DEA-registered retail pharmacy. Prior to this change, patients would have to go through their practitioner to cancel their prescription and have it  re-issued to a different pharmacy. The process was taxing and time consuming for both patients and practitioners.    The Drug Enforcement Administration [facility]) published its intent to revise the process for transferring electronic prescriptions on July 19, 2020.  The final rule was published in the federal register on March 26, 2022 and went into effect 30 days later.  Under the final rule, a prescription can only be transferred once between pharmacies, and only if allowed under existing state or other applicable law. The prescription must remain in its electronic form; may not be altered in any way; and the transfer must be communicated directly between two licensed pharmacists. It's important to note, any authorized refills transfer with the original prescription, which means the entire prescription will be filled at the same pharmacy.  Reference: hugehand.is [facility] website announcement)  [email or web address] [employer] of [employer])   [employer] / Vol. 88, No. 143 / Thursday, March 26, 2022 / Rules and Regulations DEPARTMENT OF JUSTICE  Drug Enforcement Administration  21 CFR Part 1306  [Docket No. [id number]]  RIN [id number] Transfer of Electronic Prescriptions for Schedules II-V  Controlled Substances Between Pharmacies for Initial Filling  ______________________________________________________________________       ______________________________________________________________________    Transfer of Pain Medication between Pharmacies  Re: 2023 DEA Clarification on existing regulation  Published on DEA Website: May 01, 2022  Title: Revised Regulation Allows DEA-Registered Pharmacies to Electrical Engineer Prescriptions at a Patient's Request DEA Headquarters Division - [employer]  Patients now  have the ability to request their electronic prescription be transferred to another pharmacy without having to go back to their practitioner to initiate the request. This revised regulation went into effect on Monday, April 27, 2022.     At a patient's request, a DEA-registered retail pharmacy can now transfer an electronic prescription for a controlled substance (schedules II-V) to another DEA-registered retail pharmacy. Prior to this change, patients would have to go through their practitioner to cancel their prescription and have it re-issued to a different pharmacy. The process was taxing and time consuming for both patients and practitioners.    The Drug Enforcement Administration [facility]) published its intent to revise the process for transferring electronic prescriptions on July 19, 2020.  The final rule was published in the federal register on March 26, 2022 and went into effect 30 days later.  Under the final rule, a prescription can only be transferred once between pharmacies, and only if allowed under existing state or other applicable law. The prescription must remain in its electronic form; may not be altered in any way; and the transfer must be communicated directly between two licensed pharmacists. It's important to note, any authorized refills transfer with the original prescription, which means the entire prescription will be filled at the same pharmacy.    REFERENCES: 1. DEA website announcement hugehand.is  2. Department of Justice website  Cheapwipes.[email or web address]  3. DEPARTMENT OF JUSTICE Drug Enforcement Administration 21 CFR Part 1306 [Docket No. [id number]] RIN [id number] Transfer of Electronic Prescriptions for Schedules II-V Controlled Substances Between Pharmacies for Initial  Filling  ______________________________________________________________________       ______________________________________________________________________    Medication Rules  Purpose: To inform patients, and their family members, of our medication rules and regulations.  Applies to: All patients receiving prescriptions from our practice (written or electronic).  Pharmacy of record: This is the pharmacy where your electronic prescriptions will be sent. Make sure we have the correct one.  Electronic prescriptions: In compliance with the [place]  Strengthen Opioid Misuse Prevention (STOP) Act of 2017 (Session Law 2017-74/H243), effective August 31, 2018, all controlled substances must be electronically prescribed. Written prescriptions, faxing, or calling prescriptions to a pharmacy will no longer be done.  Prescription refills: These will be provided only during in-person appointments. No medications will be renewed without a face-to-face evaluation with your provider. Applies to all prescriptions.  NOTE: The following applies primarily to controlled substances (Opioid* Pain Medications).   Type of encounter (visit): For patients receiving controlled substances, face-to-face visits are required. (Not an option and not up to the patient.)  Patient's Responsibilities: Pain Pills: Bring all pain pills to every appointment (except for procedure appointments). Pill counts are required.  Pill Bottles: Bring pills in original pharmacy bottle. Bring bottle, even if empty. Always bring the bottle of the most recent fill.  Medication refills: You are responsible for knowing and keeping track of what medications you are taking and when is it that you will need a refill. The day before your appointment: write a list of all prescriptions that need to be refilled. The day of the appointment: give  the list to the admitting nurse. Prescriptions will be written only during appointments. No  prescriptions will be written on procedure days. If you forget a medication: it will not be Called in, Faxed, or electronically sent. You will need to get another appointment to get these prescribed. No early refills. Do not call asking to have your prescription filled early. Partial  or short prescriptions: Occasionally your pharmacy may not have enough pills to fill your prescription.  NEVER ACCEPT a partial fill or a prescription that is short of the total amount of pills that you were prescribed.  With controlled substances the law allows 72 hours for the pharmacy to complete the prescription.  If the prescription is not completed within 72 hours, the pharmacist will require a new prescription to be written. This means that you will be short on your medicine and we WILL NOT send another prescription to complete your original prescription.  Instead, request the pharmacy to send a carrier to a nearby branch to get enough medication to provide you with your full prescription. Prescription Accuracy: You are responsible for carefully inspecting your prescriptions before leaving our office. Have the discharge nurse carefully go over each prescription with you, before taking them home. Make sure that your name is accurately spelled, that your address is correct. Check the name and dose of your medication to make sure it is accurate. Check the number of pills, and the written instructions to make sure they are clear and accurate. Make sure that you are given enough medication to last until your next medication refill appointment. Taking Medication: Take medication as prescribed. When it comes to controlled substances, taking less pills or less frequently than prescribed is permitted and encouraged. Never take more pills than instructed. Never take the medication more frequently than prescribed.  Inform other Doctors: Always inform, all of your healthcare providers, of all the medications you take. Pain  Medication from other Providers: You are not allowed to accept any additional pain medication from any other Doctor or Healthcare provider. There are two exceptions to this rule. (see below) In the event that you require additional pain medication, you are responsible for notifying us , as stated below. Cough Medicine: Often these contain an opioid, such as codeine or hydrocodone . Never accept or take cough medicine containing these opioids if you are already taking an opioid* medication. The combination may cause respiratory failure and death. Medication Agreement: You are responsible for carefully reading and following our Medication Agreement. This must be signed before receiving any prescriptions from our practice. Safely store a copy of your signed Agreement. Violations to the Agreement will result in no further prescriptions. (Additional copies of our Medication Agreement are available upon request.) Laws, Rules, & Regulations: All patients are expected to follow all [address] and [employer], Rules, [place] [employer]. Ignorance of the Laws does not constitute a valid excuse.  Illegal drugs and Controlled Substances: The use of illegal substances (including, but not limited to marijuana and its derivatives) and/or the illegal use of any controlled substances is strictly prohibited. Violation of this rule may result in the immediate and permanent discontinuation of any and all prescriptions being written by our practice. The use of any illegal substances is prohibited. Adopted CDC guidelines & recommendations: Target dosing levels will be at or below 60 MME/day. Use of benzodiazepines** is not recommended. Urine Drug testing: Patients taking controlled substances will be required to provide a urine sample upon request. Do not void before coming to your  medication management appointments. Hold emptying your bladder until you are admitted. The admitting nurse will inform you if a sample is required. Our  practice reserves the right to call you at any time to provide a sample. Once receiving the call, you have 24 hours to comply with request. Not providing a sample upon request may result in termination of medication therapy.  Exceptions: There are only two exceptions to the rule of not receiving pain medications from other Healthcare Providers. Exception #1 (Emergencies): In the event of an emergency (i.e.: accident requiring emergency care), you are allowed to receive additional pain medication. However, you are responsible for: As soon as you are able, call our office [phone], at any time of the day or night, and leave a message stating your name, the date and nature of the emergency, and the name and dose of the medication prescribed. In the event that your call is answered by a member of our staff, make sure to document and save the date, time, and the name of the person that took your information.  Exception #2 (Planned Surgery): In the event that you are scheduled by another doctor or dentist to have any type of surgery or procedure, you are allowed (for a period no longer than 30 days), to receive additional pain medication, for the acute post-op pain. However, in this case, you are responsible for picking up a copy of our Post-op Pain Management for Surgeons handout, and giving it to your surgeon or dentist. This document is available at our office, and does not require an appointment to obtain it. Simply go to our office during business hours (Monday-Thursday from 8:00 AM to 4:00 PM) (Friday 8:00 AM to 12:00 Noon) or if you have a scheduled appointment with us , prior to your surgery, and ask for it by name. In addition, you are responsible for: calling our office [phone], at any time of the day or night, and leaving a message stating your name, name of your surgeon, type of surgery, and date of procedure or surgery. Failure to comply with your responsibilities may result in termination  of therapy involving the controlled substances.  Consequences:  Non-compliance with the above rules may result in permanent discontinuation of medication prescription therapy. All patients receiving any type of controlled substance is expected to comply with the above patient responsibilities. Not doing so may result in permanent discontinuation of medication prescription therapy. Medication Agreement Violation. Following the above rules, including your responsibilities will help you in avoiding a Medication Agreement Violation ("Breaking your Pain Medication Contract").  *Opioid medications include: morphine , codeine, oxycodone , oxymorphone, hydrocodone , hydromorphone , meperidine , tramadol, tapentadol, buprenorphine, fentanyl , methadone. **Benzodiazepine medications include: diazepam  (Valium ), alprazolam (Xanax), clonazepam (Klonopine), lorazepam (Ativan), clorazepate (Tranxene), chlordiazepoxide (Librium), estazolam (Prosom), oxazepam (Serax), temazepam (Restoril), triazolam (Halcion) (Last updated: 06/23/2023) ______________________________________________________________________     ______________________________________________________________________    Medication Recommendations and Reminders  Applies to: All patients receiving prescriptions (written and/or electronic).  Medication Rules & Regulations: You are responsible for reading, knowing, and following our Medication Rules document. These exist for your safety and that of others. They are not flexible and neither are we. Dismissing or ignoring them is an act of non-compliance that may result in complete and irreversible termination of such medication therapy. For safety reasons, non-compliance will not be tolerated. As with the [place] fundamental legal principle of ignorance of the law is no defense, we will accept no excuses for not having read and knowing the content of documents provided to  you by our practice.  Pharmacy of  record:  Definition: This is the pharmacy where your electronic prescriptions will be sent.  We do not endorse any particular pharmacy. It is up to you and your insurance to decide what pharmacy to use.  We do not restrict you in your choice of pharmacy. However, once we write for your prescriptions, we will NOT be re-sending more prescriptions to fix restricted supply problems created by your pharmacy, or your insurance.  The pharmacy listed in the electronic medical record should be the one where you want electronic prescriptions to be sent. If you choose to change pharmacy, simply notify our nursing staff. Changes will be made only during your regular appointments and not over the phone.  Recommendations: Keep all of your pain medications in a safe place, under lock and key, even if you live alone. We will NOT replace lost, stolen, or damaged medication. We do not accept Police Reports as proof of medications having been stolen. After you fill your prescription, take 1 week's worth of pills and put them away in a safe place. You should keep a separate, properly labeled bottle for this purpose. The remainder should be kept in the original bottle. Use this as your primary supply, until it runs out. Once it's gone, then you know that you have 1 week's worth of medicine, and it is time to come in for a prescription refill. If you do this correctly, it is unlikely that you will ever run out of medicine. To make sure that the above recommendation works, it is very important that you make sure your medication refill appointments are scheduled at least 1 week before you run out of medicine. To do this in an effective manner, make sure that you do not leave the office without scheduling your next medication management appointment. Always ask the nursing staff to show you in your prescription , when your medication will be running out. Then arrange for the receptionist to get you a return appointment, at least  7 days before you run out of medicine. Do not wait until you have 1 or 2 pills left, to come in. This is very poor planning and does not take into consideration that we may need to cancel appointments due to bad weather, sickness, or emergencies affecting our staff. DO NOT ACCEPT A Partial Fill: If for any reason your pharmacy does not have enough pills/tablets to completely fill or refill your prescription, do not allow for a partial fill. The law allows the pharmacy to complete that prescription within 72 hours, without requiring a new prescription. If they do not fill the rest of your prescription within those 72 hours, you will need a separate prescription to fill the remaining amount, which we will NOT provide. If the reason for the partial fill is your insurance, you will need to talk to the pharmacist about payment alternatives for the remaining tablets, but again, DO NOT ACCEPT A PARTIAL FILL, unless you can trust your pharmacist to obtain the remainder of the pills within 72 hours.  Prescription refills and/or changes in medication(s):  Prescription refills, and/or changes in dose or medication, will be conducted only during scheduled medication management appointments. (Applies to both, written and electronic prescriptions.) No refills on procedure days. No medication will be changed or started on procedure days. No changes, adjustments, and/or refills will be conducted on a procedure day. Doing so will interfere with the diagnostic portion of the procedure. No phone refills. No medications  will be called into the pharmacy. No Fax refills. No weekend refills. No Holliday refills. No after hours refills.  Remember:  Business hours are:  Monday to Thursday 8:00 AM to 4:00 PM Provider's Schedule: Eric Como, MD - Appointments are:  Medication management: Monday and Wednesday 8:00 AM to 4:00 PM Procedure day: Tuesday and Thursday 7:30 AM to 4:00 PM Wallie Sherry, MD -  Appointments are:  Medication management: Tuesday and Thursday 8:00 AM to 4:00 PM Procedure day: Monday and Wednesday 7:30 AM to 4:00 PM (Last update: 06/23/2022) ______________________________________________________________________     ______________________________________________________________________    Drug Holidays  What is a Drug Holiday? Drug Holiday: is the name given to the process of slowly tapering down and temporarily stopping the pain medication for the purpose of decreasing or eliminating tolerance to the drug.  Benefits Improved effectiveness Decreased required effective dose Improved pain control End dependence on high dose therapy Decrease cost of therapy Uncovering opioid-induced hyperalgesia. (OIH)  What is opioid hyperalgesia? It is a paradoxical increase in pain caused by exposure to opioids. Stopping the opioid pain medication, contrary to the expected, it actually decreases or completely eliminates the pain. Ref.: A comprehensive review of opioid-induced hyperalgesia. Marcos Ruth, et.al. Pain Physician. 2011 Mar-Apr;14(2):145-61.  What is tolerance? Tolerance: the progressive loss of effectiveness of a pain medicine due to repetitive use. A common problem of opioid pain medications.  How long should a Drug Holiday last? Effectiveness depends on the patient staying off all opioid pain medicines for a minimum of 14 consecutive days. (2 weeks) During this time the patient should not be taking any other opioid analgesic medication.  How about just taking less of the medicine? Does not work. Will not accomplish goal of eliminating the excess receptors.  How about switching to a different pain medicine? (AKA. Opioid rotation) Does not work. Creates the illusion of effectiveness by taking advantage of inaccurate equivalent dose calculations between different opioids. -This technique was promoted by studies funded by [employer], such  as [employer], creators of OxyContin .  Can I stop the medicine cold turkey? We do not recommend it. You should always coordinate with your prescribing physician to make the transition as smoothly as possible. Avoid stopping the medicine abruptly without consulting. We recommend a slow taper.  What is a slow taper? Taper: refers to the gradual decrease in dose.   How do I stop/taper the dose? Slowly. Decrease the daily amount of pills that you take by one (1) pill every seven (7) days. This is called a slow downward taper. Example: if you normally take four (4) pills per day, drop it to three (3) pills per day for seven (7) days, then to two (2) pills per day for seven (7) days, then to one (1) per day for seven (7) days, and then stop the medicine. The 14 day Drug Holiday starts on the first day without medicine.   Will I experience withdrawals? Unlikely with a slow taper.  What triggers withdrawals? Withdrawals are triggered by the sudden/abrupt stop of high dose opioids. Withdrawals can be avoided by slowly decreasing the dose over a prolonged period of time.  What are withdrawals? Symptoms associated with sudden/abrupt reduction/stopping of high-dose, long-term use of pain medication. Withdrawal are seldom seen on low dose therapy, or patients rarely taking opioid medication.  Early Withdrawal Symptoms may include: Agitation Anxiety Muscle aches Increased tearing Insomnia Runny nose Sweating Yawning  Late symptoms may include: Abdominal cramping Diarrhea Dilated pupils Goose bumps Nausea  Vomiting  When could I see withdrawals? Onset: 8-24 hours after last use for most opioids. 12-48 hours for long-acting opioids (i.e.: methadone)  How long could they last? Duration: 4-10 days for most opioids. 14-21 days for long-acting opioids (i.e.: methadone)  What will happen after I complete my Drug Holiday? The need and indications for the opioid analgesic will be  reviewed before restarting the medication. Dose requirements will likely decrease and the dose will need to be adjusted accordingly.   (Last update: 06/23/2023) ______________________________________________________________________     ______________________________________________________________________     Naloxone  Nasal Spray  Why am I receiving this medication? [place]  STOP ACT requires that all patients taking high dose opioids or at risk of opioids respiratory depression, be prescribed an opioid reversal agent, such as Naloxone  (AKA: Narcan ).  What is this medication? NALOXONE  (nal OX one) treats opioid overdose, which causes slow or shallow breathing, severe drowsiness, or trouble staying awake. Call emergency services after using this medication. You may need additional treatment. Naloxone  works by reversing the effects of opioids. It belongs to a group of medications called opioid blockers.  COMMON BRAND NAME(S): Kloxxado , Narcan   What should I tell my care team before I take this medication? They need to know if you have any of these conditions: Heart disease Substance use disorder An unusual or allergic reaction to naloxone , other medications, foods, dyes, or preservatives Pregnant or trying to get pregnant Breast-feeding  When to use this medication? This medication is to be used for the treatment of respiratory depression (less than 8 breaths per minute) secondary to opioid overdose.   How to use this medication? This medication is for use in the nose. Lay the person on their back. Support their neck with your hand and allow the head to tilt back before giving the medication. The nasal spray should be given into 1 nostril. After giving the medication, move the person onto their side. Do not remove or test the nasal spray until ready to use. Get emergency medical help right away after giving the first dose of this medication, even if the person wakes up. You should  be familiar with how to recognize the signs and symptoms of a narcotic overdose. If more doses are needed, give the additional dose in the other nostril. Talk to your care team about the use of this medication in children. While this medication may be prescribed for children as young as newborns for selected conditions, precautions do apply.  Naloxone  Overdosage: If you think you have taken too much of this medicine contact a poison control center or emergency room at once.  NOTE: This medicine is only for you. Do not share this medicine with others.  What if I miss a dose? This does not apply.  What may interact with this medication? This is only used during an emergency. No interactions are expected during emergency use. This list may not describe all possible interactions. Give your health care provider a list of all the medicines, herbs, non-prescription drugs, or dietary supplements you use. Also tell them if you smoke, drink alcohol, or use illegal drugs. Some items may interact with your medicine.  What should I watch for while using this medication? Keep this medication ready for use in the case of an opioid overdose. Make sure that you have the phone number of your care team and local hospital ready. You may need to have additional doses of this medication. Each nasal spray contains a single dose. Some emergencies may  require additional doses. After use, bring the treated person to the nearest hospital or call 911. Make sure the treating care team knows that the person has received a dose of this medication. You will receive additional instructions on what to do during and after use of this medication before an emergency occurs.  What side effects may I notice from receiving this medication? Side effects that you should report to your care team as soon as possible: Allergic reactions--skin rash, itching, hives, swelling of the face, lips, tongue, or throat Side effects that usually do  not require medical attention (report these to your care team if they continue or are bothersome): Constipation Dryness or irritation inside the nose Headache Increase in blood pressure Muscle spasms Stuffy nose Toothache This list may not describe all possible side effects. Call your doctor for medical advice about side effects. You may report side effects to FDA at 1-800-FDA-1088.  Where should I keep my medication? Because this is an emergency medication, you should keep it with you at all times.  Keep out of the reach of children and pets. Store between 20 and 25 degrees C (68 and 77 degrees F). Do not freeze. Throw away any unused medication after the expiration date. Keep in original box until ready to use.  NOTE: This sheet is a summary. It may not cover all possible information. If you have questions about this medicine, talk to your doctor, pharmacist, or health care provider.   2023 Elsevier/Gold Standard (2021-04-25 00:00:00)  ______________________________________________________________________

## 2024-08-04 ENCOUNTER — Telehealth: Payer: Self-pay

## 2024-08-04 NOTE — Telephone Encounter (Signed)
 Post pump refill appointment.  Patient states she is doing good.

## 2024-08-10 LAB — TOXASSURE SELECT 13 (MW), URINE

## 2024-09-05 ENCOUNTER — Encounter: Admitting: Pain Medicine

## 2024-10-03 ENCOUNTER — Other Ambulatory Visit: Payer: Self-pay

## 2024-10-03 ENCOUNTER — Ambulatory Visit
Admission: EM | Admit: 2024-10-03 | Discharge: 2024-10-03 | Disposition: A | Discharge status: Home / Self Care | Source: Home / Self Care

## 2024-10-03 DIAGNOSIS — J40 Bronchitis, not specified as acute or chronic: Secondary | ICD-10-CM

## 2024-10-03 DIAGNOSIS — J329 Chronic sinusitis, unspecified: Secondary | ICD-10-CM

## 2024-10-03 MED ORDER — ALBUTEROL SULFATE HFA 108 (90 BASE) MCG/ACT IN AERS: 1.0000 | INHALATION_SPRAY | Freq: Four times a day (QID) | RESPIRATORY_TRACT | 0 refills | Status: AC | PRN

## 2024-10-03 MED ORDER — PROMETHAZINE-DM 6.25-15 MG/5ML PO SYRP: 5.0000 mL | ORAL_SOLUTION | Freq: Three times a day (TID) | ORAL | 0 refills | Status: AC | PRN

## 2024-10-03 MED ORDER — FLUTICASONE PROPIONATE 50 MCG/ACT NA SUSP: 1.0000 | Freq: Every day | NASAL | 0 refills | Status: AC

## 2024-10-03 MED ORDER — AMOXICILLIN-POT CLAVULANATE 875-125 MG PO TABS: 1.0000 | ORAL_TABLET | Freq: Two times a day (BID) | ORAL | 0 refills | Status: AC

## 2024-10-03 NOTE — Discharge Instructions (Addendum)
 Start Augmentin  antibiotic for your sinus infection and bronchitis.  Flonase  nasal spray to help with the congestion and postnasal drip.  Use the albuterol  inhaler for any wheezing or shortness of breath.  You may take Promethazine  DM as needed for your cough.  This medication may make you drowsy.  No alcohol or driving while taking this medication.  Lots of rest and fluids and follow-up with your PCP in 2 days for recheck.  Please go to the emergency room for any worsening symptoms.  Hope you feel better soon!

## 2024-10-03 NOTE — ED Triage Notes (Signed)
 Patient c/o constant dry cough, sinus pressure, nasal/chest congestion x 2 weeks. Patient reports recent low grade fever of 99. Pt reports severe pressure and pain in frontal area. Bilateral ear pain, pt reports that it feels like something wants to drip out but nothing happens. Head feels like it weighs about 1,000 lbs. Pt reports that she has tried NyQuil, DayQuil, Flu & cold medicine, and Sudafed with no relief. No known sick exposures. Former cigarette smoker. Not asthmatic.

## 2024-10-31 ENCOUNTER — Encounter: Admitting: Pain Medicine
